# Patient Record
Sex: Female | Born: 1960 | Race: White | Hispanic: No | Marital: Married | Smoking: Never smoker
Health system: Southern US, Community
[De-identification: ages and names within clinical notes are randomized; demographics above are authoritative.]

## PROBLEM LIST (undated history)

## (undated) DIAGNOSIS — H532 Diplopia: Secondary | ICD-10-CM

## (undated) DIAGNOSIS — M2550 Pain in unspecified joint: Secondary | ICD-10-CM

## (undated) DIAGNOSIS — E785 Hyperlipidemia, unspecified: Secondary | ICD-10-CM

## (undated) DIAGNOSIS — N6019 Diffuse cystic mastopathy of unspecified breast: Secondary | ICD-10-CM

## (undated) DIAGNOSIS — G8929 Other chronic pain: Secondary | ICD-10-CM

## (undated) DIAGNOSIS — G4730 Sleep apnea, unspecified: Secondary | ICD-10-CM

## (undated) DIAGNOSIS — R413 Other amnesia: Secondary | ICD-10-CM

## (undated) DIAGNOSIS — F329 Major depressive disorder, single episode, unspecified: Secondary | ICD-10-CM

## (undated) DIAGNOSIS — J42 Unspecified chronic bronchitis: Secondary | ICD-10-CM

## (undated) DIAGNOSIS — IMO0001 Reserved for inherently not codable concepts without codable children: Secondary | ICD-10-CM

## (undated) DIAGNOSIS — F32A Depression, unspecified: Secondary | ICD-10-CM

## (undated) DIAGNOSIS — Z860100 Personal history of colon polyps, unspecified: Secondary | ICD-10-CM

## (undated) DIAGNOSIS — K589 Irritable bowel syndrome without diarrhea: Secondary | ICD-10-CM

## (undated) DIAGNOSIS — R112 Nausea with vomiting, unspecified: Secondary | ICD-10-CM

## (undated) DIAGNOSIS — F419 Anxiety disorder, unspecified: Secondary | ICD-10-CM

## (undated) DIAGNOSIS — Z9889 Other specified postprocedural states: Secondary | ICD-10-CM

## (undated) DIAGNOSIS — L98499 Non-pressure chronic ulcer of skin of other sites with unspecified severity: Secondary | ICD-10-CM

## (undated) DIAGNOSIS — E876 Hypokalemia: Secondary | ICD-10-CM

## (undated) DIAGNOSIS — K219 Gastro-esophageal reflux disease without esophagitis: Secondary | ICD-10-CM

## (undated) DIAGNOSIS — G43909 Migraine, unspecified, not intractable, without status migrainosus: Secondary | ICD-10-CM

## (undated) DIAGNOSIS — R1310 Dysphagia, unspecified: Secondary | ICD-10-CM

## (undated) DIAGNOSIS — I10 Essential (primary) hypertension: Secondary | ICD-10-CM

## (undated) DIAGNOSIS — J189 Pneumonia, unspecified organism: Secondary | ICD-10-CM

## (undated) DIAGNOSIS — U071 COVID-19: Secondary | ICD-10-CM

## (undated) DIAGNOSIS — N809 Endometriosis, unspecified: Secondary | ICD-10-CM

## (undated) DIAGNOSIS — R32 Unspecified urinary incontinence: Secondary | ICD-10-CM

## (undated) DIAGNOSIS — M549 Dorsalgia, unspecified: Secondary | ICD-10-CM

## (undated) DIAGNOSIS — K52831 Collagenous colitis: Secondary | ICD-10-CM

## (undated) HISTORY — DX: Anxiety disorder, unspecified: F41.9

## (undated) HISTORY — DX: Migraine, unspecified, not intractable, without status migrainosus: G43.909

## (undated) HISTORY — DX: Unspecified urinary incontinence: R32

## (undated) HISTORY — PX: OTHER SURGICAL HISTORY: SHX169

## (undated) HISTORY — DX: Other amnesia: R41.3

## (undated) HISTORY — DX: Other chronic pain: G89.29

## (undated) HISTORY — DX: Depression, unspecified: F32.A

## (undated) HISTORY — PX: LUMBAR FUSION: SHX111

## (undated) HISTORY — DX: COVID-19: U07.1

## (undated) HISTORY — DX: Sleep apnea, unspecified: G47.30

## (undated) HISTORY — PX: DILATION AND CURETTAGE OF UTERUS: SHX78

## (undated) HISTORY — DX: Unspecified chronic bronchitis: J42

## (undated) HISTORY — DX: Major depressive disorder, single episode, unspecified: F32.9

## (undated) HISTORY — DX: Dorsalgia, unspecified: M54.9

## (undated) HISTORY — PX: MOUTH SURGERY: SHX715

## (undated) HISTORY — PX: ABDOMINAL HYSTERECTOMY: SHX81

## (undated) HISTORY — DX: Diffuse cystic mastopathy of unspecified breast: N60.19

## (undated) HISTORY — PX: NASAL SEPTOPLASTY W/ TURBINOPLASTY: SHX2070

## (undated) HISTORY — PX: BREAST BIOPSY: SHX20

## (undated) HISTORY — DX: Essential (primary) hypertension: I10

## (undated) HISTORY — PX: LUMBAR DISC SURGERY: SHX700

## (undated) HISTORY — DX: Diplopia: H53.2

## (undated) HISTORY — DX: Pneumonia, unspecified organism: J18.9

## (undated) HISTORY — PX: SEPTOPLASTY: SUR1290

## (undated) HISTORY — PX: BACK SURGERY: SHX140

## (undated) HISTORY — DX: Gastro-esophageal reflux disease without esophagitis: K21.9

## (undated) HISTORY — DX: Irritable bowel syndrome, unspecified: K58.9

## (undated) HISTORY — DX: Personal history of colonic polyps: Z86.010

## (undated) HISTORY — DX: Personal history of colon polyps, unspecified: Z86.0100

## (undated) HISTORY — DX: Hyperlipidemia, unspecified: E78.5

## (undated) HISTORY — DX: Collagenous colitis: K52.831

## (undated) HISTORY — DX: Non-pressure chronic ulcer of skin of other sites with unspecified severity: L98.499

## (undated) HISTORY — DX: Reserved for inherently not codable concepts without codable children: IMO0001

---

## 2003-03-28 HISTORY — PX: LUMBAR MICRODISCECTOMY: SHX99

## 2003-11-03 ENCOUNTER — Encounter: Admission: RE | Admit: 2003-11-03 | Discharge: 2003-11-03 | Payer: Self-pay | Admitting: Anesthesiology

## 2003-11-27 ENCOUNTER — Inpatient Hospital Stay (HOSPITAL_COMMUNITY): Admission: RE | Admit: 2003-11-27 | Discharge: 2003-11-30 | Payer: Self-pay | Admitting: Neurological Surgery

## 2004-01-06 ENCOUNTER — Encounter: Admission: RE | Admit: 2004-01-06 | Discharge: 2004-01-06 | Payer: Self-pay | Admitting: Neurological Surgery

## 2004-01-22 ENCOUNTER — Ambulatory Visit (HOSPITAL_COMMUNITY): Admission: RE | Admit: 2004-01-22 | Discharge: 2004-01-22 | Payer: Self-pay | Admitting: Neurological Surgery

## 2004-04-14 ENCOUNTER — Encounter: Admission: RE | Admit: 2004-04-14 | Discharge: 2004-04-14 | Payer: Self-pay | Admitting: Neurological Surgery

## 2004-04-29 ENCOUNTER — Inpatient Hospital Stay (HOSPITAL_COMMUNITY): Admission: RE | Admit: 2004-04-29 | Discharge: 2004-05-01 | Payer: Self-pay | Admitting: Neurological Surgery

## 2004-05-24 ENCOUNTER — Emergency Department: Payer: Self-pay | Admitting: General Practice

## 2004-06-02 ENCOUNTER — Ambulatory Visit: Payer: Self-pay | Admitting: Internal Medicine

## 2004-06-07 ENCOUNTER — Encounter: Admission: RE | Admit: 2004-06-07 | Discharge: 2004-06-07 | Payer: Self-pay | Admitting: Neurological Surgery

## 2004-06-17 ENCOUNTER — Ambulatory Visit: Payer: Self-pay | Admitting: Internal Medicine

## 2004-08-04 ENCOUNTER — Encounter: Admission: RE | Admit: 2004-08-04 | Discharge: 2004-08-04 | Payer: Self-pay | Admitting: Neurological Surgery

## 2004-12-27 ENCOUNTER — Encounter: Admission: RE | Admit: 2004-12-27 | Discharge: 2004-12-27 | Payer: Self-pay | Admitting: Neurological Surgery

## 2005-01-14 ENCOUNTER — Ambulatory Visit (HOSPITAL_COMMUNITY): Admission: RE | Admit: 2005-01-14 | Discharge: 2005-01-14 | Payer: Self-pay | Admitting: Neurosurgery

## 2005-01-25 ENCOUNTER — Ambulatory Visit (HOSPITAL_COMMUNITY): Admission: RE | Admit: 2005-01-25 | Discharge: 2005-01-26 | Payer: Self-pay | Admitting: Neurosurgery

## 2005-06-01 ENCOUNTER — Encounter: Admission: RE | Admit: 2005-06-01 | Discharge: 2005-06-01 | Payer: Self-pay | Admitting: Anesthesiology

## 2005-07-21 ENCOUNTER — Ambulatory Visit: Payer: Self-pay | Admitting: Internal Medicine

## 2005-08-22 ENCOUNTER — Ambulatory Visit: Payer: Self-pay | Admitting: Internal Medicine

## 2005-08-31 ENCOUNTER — Ambulatory Visit: Payer: Self-pay | Admitting: Urology

## 2005-10-12 ENCOUNTER — Encounter: Admission: RE | Admit: 2005-10-12 | Discharge: 2005-10-12 | Payer: Self-pay | Admitting: Neurosurgery

## 2005-10-18 ENCOUNTER — Ambulatory Visit: Payer: Self-pay | Admitting: Internal Medicine

## 2005-11-16 ENCOUNTER — Ambulatory Visit: Payer: Self-pay | Admitting: Internal Medicine

## 2005-11-23 ENCOUNTER — Ambulatory Visit: Payer: Self-pay | Admitting: Internal Medicine

## 2005-11-29 ENCOUNTER — Ambulatory Visit: Payer: Self-pay | Admitting: Internal Medicine

## 2005-12-08 ENCOUNTER — Ambulatory Visit: Payer: Self-pay

## 2005-12-15 ENCOUNTER — Ambulatory Visit (HOSPITAL_COMMUNITY): Admission: RE | Admit: 2005-12-15 | Discharge: 2005-12-15 | Payer: Self-pay | Admitting: Neurosurgery

## 2005-12-19 ENCOUNTER — Ambulatory Visit: Payer: Self-pay | Admitting: Internal Medicine

## 2005-12-22 ENCOUNTER — Ambulatory Visit: Payer: Self-pay | Admitting: Internal Medicine

## 2006-01-10 ENCOUNTER — Ambulatory Visit: Payer: Self-pay | Admitting: Internal Medicine

## 2006-01-30 ENCOUNTER — Ambulatory Visit: Payer: Self-pay | Admitting: Internal Medicine

## 2006-02-02 ENCOUNTER — Encounter: Admission: RE | Admit: 2006-02-02 | Discharge: 2006-02-02 | Payer: Self-pay | Admitting: Neurological Surgery

## 2006-02-07 ENCOUNTER — Ambulatory Visit: Payer: Self-pay | Admitting: Internal Medicine

## 2006-03-15 ENCOUNTER — Inpatient Hospital Stay (HOSPITAL_COMMUNITY): Admission: RE | Admit: 2006-03-15 | Discharge: 2006-03-20 | Payer: Self-pay | Admitting: Neurological Surgery

## 2006-03-27 ENCOUNTER — Encounter: Admission: RE | Admit: 2006-03-27 | Discharge: 2006-03-27 | Payer: Self-pay | Admitting: Neurological Surgery

## 2006-05-08 ENCOUNTER — Ambulatory Visit: Payer: Self-pay | Admitting: Internal Medicine

## 2006-06-05 ENCOUNTER — Other Ambulatory Visit: Payer: Self-pay

## 2006-06-05 ENCOUNTER — Emergency Department: Payer: Self-pay | Admitting: Emergency Medicine

## 2006-06-26 ENCOUNTER — Encounter: Admission: RE | Admit: 2006-06-26 | Discharge: 2006-06-26 | Payer: Self-pay | Admitting: Neurological Surgery

## 2006-07-14 ENCOUNTER — Ambulatory Visit: Payer: Self-pay | Admitting: Internal Medicine

## 2006-07-31 ENCOUNTER — Encounter: Admission: RE | Admit: 2006-07-31 | Discharge: 2006-07-31 | Payer: Self-pay | Admitting: Neurological Surgery

## 2006-08-01 ENCOUNTER — Ambulatory Visit: Payer: Self-pay | Admitting: Internal Medicine

## 2006-08-10 ENCOUNTER — Inpatient Hospital Stay: Payer: Self-pay

## 2006-11-13 ENCOUNTER — Ambulatory Visit: Payer: Self-pay | Admitting: Internal Medicine

## 2006-12-29 ENCOUNTER — Ambulatory Visit: Payer: Self-pay | Admitting: Unknown Physician Specialty

## 2007-02-14 ENCOUNTER — Ambulatory Visit: Payer: Self-pay | Admitting: Internal Medicine

## 2007-03-16 ENCOUNTER — Encounter: Admission: RE | Admit: 2007-03-16 | Discharge: 2007-03-16 | Payer: Self-pay | Admitting: Neurological Surgery

## 2007-03-30 ENCOUNTER — Ambulatory Visit: Payer: Self-pay | Admitting: Internal Medicine

## 2007-05-22 ENCOUNTER — Ambulatory Visit: Payer: Self-pay | Admitting: Internal Medicine

## 2007-05-25 DIAGNOSIS — R51 Headache: Secondary | ICD-10-CM | POA: Insufficient documentation

## 2007-05-25 DIAGNOSIS — J309 Allergic rhinitis, unspecified: Secondary | ICD-10-CM | POA: Insufficient documentation

## 2007-05-25 DIAGNOSIS — J45909 Unspecified asthma, uncomplicated: Secondary | ICD-10-CM | POA: Insufficient documentation

## 2007-05-25 DIAGNOSIS — M549 Dorsalgia, unspecified: Secondary | ICD-10-CM | POA: Insufficient documentation

## 2007-05-25 DIAGNOSIS — K219 Gastro-esophageal reflux disease without esophagitis: Secondary | ICD-10-CM | POA: Insufficient documentation

## 2007-05-25 DIAGNOSIS — R519 Headache, unspecified: Secondary | ICD-10-CM | POA: Insufficient documentation

## 2007-05-25 DIAGNOSIS — J019 Acute sinusitis, unspecified: Secondary | ICD-10-CM | POA: Insufficient documentation

## 2007-06-06 ENCOUNTER — Ambulatory Visit: Payer: Self-pay | Admitting: Internal Medicine

## 2007-07-06 ENCOUNTER — Ambulatory Visit: Payer: Self-pay | Admitting: Internal Medicine

## 2007-07-23 ENCOUNTER — Ambulatory Visit: Payer: Self-pay | Admitting: Internal Medicine

## 2007-08-21 ENCOUNTER — Encounter: Payer: Self-pay | Admitting: Internal Medicine

## 2007-09-11 ENCOUNTER — Telehealth (INDEPENDENT_AMBULATORY_CARE_PROVIDER_SITE_OTHER): Payer: Self-pay | Admitting: *Deleted

## 2007-10-02 ENCOUNTER — Ambulatory Visit: Payer: Self-pay | Admitting: Internal Medicine

## 2007-10-03 ENCOUNTER — Ambulatory Visit: Payer: Self-pay | Admitting: Internal Medicine

## 2007-10-10 ENCOUNTER — Telehealth (INDEPENDENT_AMBULATORY_CARE_PROVIDER_SITE_OTHER): Payer: Self-pay | Admitting: *Deleted

## 2007-10-16 ENCOUNTER — Ambulatory Visit: Payer: Self-pay | Admitting: Internal Medicine

## 2008-01-22 ENCOUNTER — Telehealth (INDEPENDENT_AMBULATORY_CARE_PROVIDER_SITE_OTHER): Payer: Self-pay | Admitting: *Deleted

## 2008-01-23 ENCOUNTER — Telehealth (INDEPENDENT_AMBULATORY_CARE_PROVIDER_SITE_OTHER): Payer: Self-pay | Admitting: *Deleted

## 2008-01-30 ENCOUNTER — Encounter: Payer: Self-pay | Admitting: Internal Medicine

## 2008-02-11 ENCOUNTER — Encounter: Admission: RE | Admit: 2008-02-11 | Discharge: 2008-02-11 | Payer: Self-pay | Admitting: Neurological Surgery

## 2008-03-20 ENCOUNTER — Ambulatory Visit: Payer: Self-pay | Admitting: Internal Medicine

## 2008-03-28 ENCOUNTER — Ambulatory Visit: Payer: Self-pay | Admitting: Internal Medicine

## 2008-04-02 ENCOUNTER — Ambulatory Visit: Payer: Self-pay | Admitting: Internal Medicine

## 2008-05-22 ENCOUNTER — Telehealth (INDEPENDENT_AMBULATORY_CARE_PROVIDER_SITE_OTHER): Payer: Self-pay | Admitting: *Deleted

## 2008-07-15 ENCOUNTER — Ambulatory Visit: Payer: Self-pay | Admitting: Internal Medicine

## 2008-07-18 ENCOUNTER — Ambulatory Visit: Payer: Self-pay | Admitting: Internal Medicine

## 2008-08-18 ENCOUNTER — Telehealth (INDEPENDENT_AMBULATORY_CARE_PROVIDER_SITE_OTHER): Payer: Self-pay | Admitting: *Deleted

## 2008-10-23 ENCOUNTER — Telehealth (INDEPENDENT_AMBULATORY_CARE_PROVIDER_SITE_OTHER): Payer: Self-pay | Admitting: *Deleted

## 2008-10-31 ENCOUNTER — Ambulatory Visit: Payer: Self-pay

## 2008-10-31 ENCOUNTER — Ambulatory Visit: Payer: Self-pay | Admitting: Internal Medicine

## 2009-01-16 ENCOUNTER — Ambulatory Visit: Payer: Self-pay | Admitting: Cardiology

## 2009-04-10 ENCOUNTER — Ambulatory Visit: Payer: Self-pay | Admitting: Rheumatology

## 2009-11-12 ENCOUNTER — Ambulatory Visit: Payer: Self-pay | Admitting: Internal Medicine

## 2009-11-12 DIAGNOSIS — M3313 Other dermatomyositis without myopathy: Secondary | ICD-10-CM | POA: Insufficient documentation

## 2009-11-12 DIAGNOSIS — M3390 Dermatopolymyositis, unspecified, organ involvement unspecified: Secondary | ICD-10-CM | POA: Insufficient documentation

## 2009-12-02 ENCOUNTER — Ambulatory Visit: Payer: Self-pay | Admitting: Internal Medicine

## 2010-04-12 ENCOUNTER — Ambulatory Visit: Payer: Self-pay | Admitting: Internal Medicine

## 2010-04-28 ENCOUNTER — Ambulatory Visit: Payer: Self-pay | Admitting: Internal Medicine

## 2010-05-27 ENCOUNTER — Ambulatory Visit: Payer: Self-pay | Admitting: Internal Medicine

## 2010-06-03 ENCOUNTER — Telehealth (INDEPENDENT_AMBULATORY_CARE_PROVIDER_SITE_OTHER): Payer: Self-pay | Admitting: *Deleted

## 2010-06-28 ENCOUNTER — Ambulatory Visit: Payer: Self-pay | Admitting: Internal Medicine

## 2010-08-30 LAB — TSH: TSH: 0.82 u[IU]/mL (ref 0.41–5.90)

## 2010-08-30 LAB — BASIC METABOLIC PANEL: Glucose: 85 mg/dL

## 2010-09-07 ENCOUNTER — Encounter
Admission: RE | Admit: 2010-09-07 | Discharge: 2010-09-07 | Payer: Self-pay | Source: Home / Self Care | Attending: Anesthesiology | Admitting: Anesthesiology

## 2010-09-14 NOTE — Progress Notes (Signed)
Summary: Calling for meds for wheeze and cough  Phone Note Call from Patient   Caller: Patient Call For: young Reason for Call: Acute Illness Summary of Call: need somthing for asthma and allergy [facility] [phone] Initial call taken by: Rickard Patience,  January 22, 2008 8:46 AM  Follow-up for Phone Call        called and spoke with pt.   she c/o of nasal congestion that is green/brown in color with some blood.  she is wheezing and has a non-productive cough.  please advise Follow-up by: Marijo File CMA,  January 22, 2008 10:07 AM  Additional Follow-up for Phone Call Additional follow up Details #1::        Offer z-pak and Mucinex Does she need a prednisone taper? Additional Follow-up by: Waymon Budge MD,  January 22, 2008 12:07 PM    Additional Follow-up for Phone Call Additional follow up Details #2::    Pt is already using Mucinex. She does want the Zpak and feels she may need the Prednisone taper also. Michel Bickers CMA  January 22, 2008 2:07 PM  Additional Follow-up for Phone Call Additional follow up Details #3:: Details for Additional Follow-up Action Taken: Offer pred script to hold: prednisone 10 mg, # 20 2 tabs, 4 times dailyx 2 days, 3 times daily x 2 days, 2 times daily x 2 days, once daily x 2 days   Additional Follow-up by: Waymon Budge MD,  January 22, 2008 5:19 PM  New/Updated Medications: PREDNISONE 10 MG  TABS (PREDNISONE) 2 by mouth 4 times a day x2 days, 2 by mouth three times a day x2 days, 2 by mouth two times a day x2 days and 1 by mouth x 2 days and stop.   Left detailed message for pt regarding the Prednisone taper. She can call back with any further questions. Michel Bickers CMA  January 22, 2008 5:31 PM      Prescriptions: PREDNISONE 10 MG  TABS (PREDNISONE) 2 by mouth 4 times a day x2 days, 2 by mouth three times a day x2 days, 2 by mouth two times a day x2 days and 1 by mouth x 2 days and stop.  #20 x 0   Entered by:   Michel Bickers CMA   Authorized by:    Waymon Budge MD   Signed by:   Michel Bickers CMA on 01/22/2008   Method used:   Electronically sent to ...       [phone]       [address]       [place], [place]  [address]       Ph: [phone] or [phone]       Fax: [phone]   RxID:   [id number]     Appended Document: rx for zpak    Clinical Lists Changes  Medications: Added new medication of ZITHROMAX Z-PAK 250 MG  TABS (AZITHROMYCIN) use as directed - Signed Rx of ZITHROMAX Z-PAK 250 MG  TABS (AZITHROMYCIN) use as directed;  #1 x 0;  Signed;  Entered by: Boone Master CNA;  Authorized by: Waymon Budge MD;  Method used: Electronic    Prescriptions: ZITHROMAX Z-PAK 250 MG  TABS (AZITHROMYCIN) use as directed  #1 x 0   Entered by:   Boone Master CNA   Authorized by:   Waymon Budge MD  Signed by:   Boone Master CNA on 01/23/2008   Method used:   Electronically sent to ...       [facility] [phone]*       [address]       [place], [place]  [address]       Ph: [phone] or [phone]       Fax: [phone]   RxID:   [id number]

## 2010-09-14 NOTE — Assessment & Plan Note (Signed)
Summary: follow up/ mbw   Visit Type:  Follow-up PCP:  charlene [facility]/[place]  Chief Complaint:  follow up visit .  History of Present Illness: Current Problems:  SINUSITIS (ICD-473.9) BACK PAIN (ICD-724.5) HEADACHE (ICD-784.0) GERD (ICD-530.81) ASTHMA (ICD-493.90) ALLERGIC RHINITIS (ICD-101.57)  50 year old woman returning for follow-up of asthma and allergic rhinitis with history of recurrent sinusitis and significant limitation by chronic degenerative back pain.  On June 10, we had called in prednisone  and a Z-Pak, then Biaxin.  Head congestion got better, but did not completely clear.  Then, on July 15 she reported increased shortness of breath such that she went to an urgent care.  They put her on prednisone and Levaquin after giving injection of Rocephin and steroids.  Again, says she got better, but not well.  Saw her primary physician, July 29, and was told she needed more prednisone, and another course of Levaquin.  Also, her Flovent was increased to 220.  She was sent to an ENT who told her CT scan of sinuses was clear, but she had an "ulcer in her nose."  She was treated for yeast infection with Bactroban ointment, Lotrimin ointment , alternating.  She quit Singulair, saying it caused  headache.  She says she sings with her sister in church, but wheezes after singing.       Prior Medications Reviewed Using: Patient Recall  Updated Prior Medication List: FLOVENT HFA 220 MCG/ACT  AERO (FLUTICASONE PROPIONATE  HFA) 2 puffs two times a day EFFEXOR 37.5 MG  TABS (VENLAFAXINE HCL) Take 1 tablet by mouth once a day AMBIEN 10 MG  TABS (ZOLPIDEM TARTRATE) Take 1 tab by mouth at bedtime NEURONTIN 800 MG  TABS (GABAPENTIN) Take 1 tablet by mouth four times a day LIDODERM 5 %  PTCH (LIDOCAINE) Apply 2 patches to foot at bedtime OXYCONTIN 30 MG  TB12 (OXYCODONE HCL) Take 1 tablet by mouth three times a day VALIUM 5 MG  TABS (DIAZEPAM) Take 1 tablet by mouth three times a  day PERCOCET 7.5-325 MG  TABS (OXYCODONE-ACETAMINOPHEN) Take 1 tablet by mouth four times a day as needed CEREFOLIN NAC 5.6-2-600 MG  TABS (METHYLFOL-METHYLCOB-ACETYLCYST) Take 1 tablet by mouth once a day ALBUTEROL 90 MCG/ACT  AERS (ALBUTEROL) Inhale 2 puffs every four hours as needed ADULT ASPIRIN LOW STRENGTH 81 MG  TBDP (ASPIRIN) Take 1 tablet by mouth once a day TRIAMTERENE-HCTZ 37.5-25 MG  TABS (TRIAMTERENE-HCTZ) Take 1 tablet by mouth once a day METOPROLOL TARTRATE 25 MG  TABS (METOPROLOL TARTRATE) Take 1/2 tablet two times a day OMEPRAZOLE 20 MG  TBEC (OMEPRAZOLE) Take 1 tablet by mouth two times a day MULTIVITAMINS   TABS (MULTIPLE VITAMIN) Take 1 tablet by mouth once a day VENTOLIN HFA 108 (90 BASE) MCG/ACT  AERS (ALBUTEROL SULFATE) 2 puffs four times a day as needed ALPRAZOLAM 1 MG  TABS (ALPRAZOLAM) prn ALBUTEROL 90 MCG/ACT AERO SOLN (ALBUTEROL) 2 Puffs every 4 hours as needed for asthma * NEBULIZER MACHINE FOR AEROSOL MEDS  ALBUTEROL SULFATE (2.5 MG/3ML) 0.083%  NEBU (ALBUTEROL SULFATE) 1 neb four times a day prn * ALLERGY VACCINE 1:50 GO (W-E)  EPIPEN 2-PAK 0.3 MG/0.3ML (1:1000)  DEVI (EPINEPHRINE HCL (ANAPHYLAXIS)) For severe allergic reaction SINGULAIR 10 MG  TABS (MONTELUKAST SODIUM) once daily  Current Allergies (reviewed today): ! IMITREX ! METROGEL (METRONIDAZOLE) ! BUSPAR ! REMERON (MIRTAZAPINE) ! DURAGESIC-100 ! OPANA (OXYMORPHONE HCL) ! SINGULAIR (MONTELUKAST SODIUM) ! PREMARIN ! ESTROGEL (ESTRADIOL)  Past Medical History:    Reviewed history from  10/02/2007 and no changes required:       Allergic rhinitis       Asthma       GERD       Headache       chronic back pain       anxiety/depression          Social History:    Reviewed history from 10/02/2007 and no changes required:       Patient never smoked.       Disability due to back pain. Previously supervisor transcription, [employer]     Review of Systems      See HPI   Vital  Signs:  Patient Profile:   50 Years Old Female Weight:      235.38 pounds O2 Sat:      100 % O2 treatment:    Room Air Pulse rate:   90 / minute BP sitting:   98 / 70  (left arm) Cuff size:   large  Vitals Entered By: Reynaldo Minium CMA (April 02, 2008 11:25 AM)             Comments Medications reviewed with patient Reynaldo Minium CMA  April 02, 2008 11:26 AM      Physical Exam  General: A/Ox3; pleasant and cooperative, NAD, overweight SKIN: no rash, lesions NODES: no lymphadenopathy NECK: Supple w/ fair ROM, JVD- none, normal carotid impulses w/o bruits Thyroid- normal to palpation CHEST: Clear to P&A, but wheezy cough HEART: RRR, no m/g/r heard ABDOMEN: Soft and nl; nml bowel sounds; no organomegaly or masses noted LOV:FIEP, nl pulses, no edema  NEURO: Grossly intact to observation HEENT: [place]/AT, EOM- wnl, Conjunctivae- clear, PERRLA, TMs- wnl, Nose-  scab left septum, non obstructing, Throat- clear and wnl        Problem # 1:  ASTHMA (ICD-493.90) Chronic asthma with ongoing control problems as noted. Plan continue present meds. Emphasize environmental cotrols, reflux precautions, and use of inhaled meds where possible to minimize use of systemic steroids. The following medications were removed from the medication list:    Prednisone 5 Mg Tabs (Prednisone) .Marland Kitchen... Take 1tab by mouth 4times a day for 2 days;take 1 tab by mouth 3 times a day for 2 days;take 1 tablet by mouth 2 times a day for 2 days;take 1 tab by mouth for 2 days;d/c    Prednisone 10 Mg Tabs (Prednisone) .Marland Kitchen... 2 by mouth 4 times a day x2 days, 2 by mouth three times a day x2 days, 2 by mouth two times a day x2 days and 1 by mouth x 2 days and stop.  Her updated medication list for this problem includes:    Flovent Hfa 220 Mcg/act Aero (Fluticasone propionate  hfa) .Marland Kitchen... 2 puffs two times a day    Albuterol 90 Mcg/act Aers (Albuterol) ..... Inhale 2 puffs every four hours as needed    Ventolin Hfa 108 (90  Base) Mcg/act Aers (Albuterol sulfate) .Marland Kitchen... 2 puffs four times a day as needed    Albuterol 90 Mcg/act Aero Soln (Albuterol) .Marland Kitchen... 2 puffs every 4 hours as needed for asthma    Albuterol Sulfate (2.5 Mg/79ml) 0.083% Nebu (Albuterol sulfate) .Marland Kitchen... 1 neb four times a day prn    Singulair 10 Mg Tabs (Montelukast sodium) ..... Once daily   Problem # 2:  ALLERGIC RHINITIS (ICD-477.9) Rhinitis with erosion.  Plan: Continue Bactroban and Lotrimin as directed by her ENT.  Continue allergy vaccine.  I will check with the allergy lab about  advancing to 1:10.  Medications Added to Medication List This Visit: 1)  Flovent Hfa 220 Mcg/act Aero (Fluticasone propionate  hfa) .... 2 puffs two times a day   Patient Instructions: 1)  Please schedule a follow-up appointment in 4 months. 2)  I will talk with allergy lab about your vaccine dose.   ]  Appended Document: Advance vaccine to 1:10 GO (W-E)  Ready to advance to Allergy vaccine 1:10 as tolerated.

## 2010-09-14 NOTE — Miscellaneous (Signed)
Summary: Orders Update pft charges  Clinical Lists Changes  Orders: Added new Service order of Carbon Monoxide diffusing w/capacity (94720) - Signed Added new Service order of Lung Volumes (94240) - Signed Added new Service order of Spirometry (Pre & Post) (94060) - Signed 

## 2010-09-14 NOTE — Progress Notes (Signed)
Summary: rx request/ cough/ asthma  Phone Note Call from Patient   Caller: Patient Call For: young Summary of Call: requests rx prednisone and abx. pt c/o asthma/ cough x 1 wk. [facility] [address] [facility] [place] [phone].  Initial call taken by: Tivis Ringer,  August 18, 2008 10:39 AM  Follow-up for Phone Call        Called spoke with pt.  Onset of symptoms x 1 week ago.  Productive cough, green phlegm.  Incr SOB and difficulty breathing.  Pt states sore from coughing.  Pt is requesting abx and pred be called to pharmacy.  Pt states she has pending appt in 2 weeks.  Please advise. Thanks Follow-up by: Cloyde Reams RN,  August 18, 2008 12:46 PM  Additional Follow-up for Phone Call Additional follow up Details #1::        Offer prenisone 10 mg, # 20- 1 tab four times daily x 2 days, 3 times daily x 2 days, 2 times daily x 2 days, 1 time daily x 2 days  Offer Zpak    Additional Follow-up for Phone Call Additional follow up Details #2::    Called spoke with pt advised of CY's recommendations.  Rx sent to pharmacy.  Pt aware. Follow-up by: Cloyde Reams RN,  August 18, 2008 2:04 PM  New/Updated Medications: ZITHROMAX Z-PAK 250 MG TABS (AZITHROMYCIN) use as directed   Prescriptions: ZITHROMAX Z-PAK 250 MG TABS (AZITHROMYCIN) use as directed  #1 x 0   Entered by:   Cloyde Reams RN   Authorized by:   Waymon Budge MD   Signed by:   Cloyde Reams RN on 08/18/2008   Method used:   Electronically to        [facility] [phone]* (retail)       [address]       [place], [place]  [address]       Ph: [phone] or [phone]       Fax: [phone]   RxID:   [id number] PREDNISONE 10 MG TABS (PREDNISONE) 4 x 2 days, 3 x 2 days, 2 x 2 days, then 1 x 2 days then stop  #20 x 0   Entered by:   Cloyde Reams RN   Authorized by:   Waymon Budge MD   Signed by:   Cloyde Reams RN on 08/18/2008   Method used:   Electronically to        [facility] [phone]* (retail)       [address]       [place], [place]  [address]       Ph: [phone] or [phone]       Fax: [phone]   RxID:   [id number]

## 2010-09-14 NOTE — Progress Notes (Signed)
Summary: increased sob/wheezing/cough----rx for z pak  Phone Note Call from Patient   Caller: Patient Call For: young Summary of Call: asthma flare up with cough and wheezing [facility] [address] Initial call taken by: Rickard Patience,  June 03, 2010 1:54 PM  Follow-up for Phone Call        called and spoke with pt. pt was recently seen by CY last week on 05/27/2010 and was given rx for zpak and pred taper.  Pt states she finished z pak and has 1 day left on pred taper.  pt states she started to feel better but was around her sick grandson recently and believes she is getting sick again.  Pt c/o increased sob, wheezing, tightness in chest, scratchy throat, PND, and cough but states she is unable to get sputum up.  Will forward message to CY to address. Aundra Millet Reynolds LPN  June 03, 2010 2:22 PM  Allergies (verified):  1)  ! Imitrex 2)  ! Metrogel (Metronidazole) 3)  ! Buspar 4)  ! Remeron (Mirtazapine) 5)  ! Duragesic-100 6)  ! Opana (Oxymorphone Hcl) 7)  ! Singulair (Montelukast Sodium) 8)  ! Premarin 9)  ! Estrogel (Estradiol)  Additional Follow-up for Phone Call Additional follow up Details #1::        Per CDY-tell patient to increase her fluids, take Tylenol as directed also give her Doxycycline 100mg  #8 take 2 today then 1 daily until gone(SHE IS TO HOLD THIS RX AT PHARMACY UNTIL THINGS GET WORSE).Reynaldo Minium CMA  June 03, 2010 3:08 PM    New Allergies: ! DOXYCYCLINE Additional Follow-up for Phone Call Additional follow up Details #2::    called and spoke with pt and she was informed per CY to take the tylenol as directed, increase her fluids and i explained to her about the doxy---pt stated that the doxy causes her to have abd pain and she would like something else called in and wanted to know if CY will give her prednisone as well.  please advise. thanks Randell Loop CMA  June 03, 2010 3:42 PM    per CY:  z pak # 1 x 0 refills.  UAD.   No prednisone.  She  needs to use probiotics for this.   called and spoke with pt.  pt aware of CY's response and rx sent to pharmacy. Aundra Millet Reynolds LPN  June 03, 2010 5:30 PM   New/Updated Medications: ZITHROMAX Z-PAK 250 MG TABS (AZITHROMYCIN) take as directed New Allergies: ! DOXYCYCLINEPrescriptions: ZITHROMAX Z-PAK 250 MG TABS (AZITHROMYCIN) take as directed  #1 x 0   Entered by:   Arman Filter LPN   Authorized by:   Waymon Budge MD   Signed by:   Arman Filter LPN on 57/84/6962   Method used:   Electronically to        [facility] [phone]* (retail)       [address] [place], [place]  [address]       Ph: [phone] or [phone]       Fax: [phone]   RxID:   [id number]

## 2010-09-14 NOTE — Assessment & Plan Note (Signed)
Summary: rov 1 month///kp   Primary Provider/Referring Provider:  charlene Scott/[place]  CC:  1 month follow up visit-review PFT results..  History of Present Illness:  History of Present Illness: November 12, 2009- Asthma, allergic rhinitis, dermatomyositis............................Marland Kitchenmother here She has gone to ENT for prednisone, and to urgent care for wheeze. ENT  treats an ulcer she reports in left nostril. Last prednisone 1 month ago.  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 Methotrexate and steroids didn't help this. Sees Dr Lavenia Atlas at [facility] for rheumatolgy. CXR said ok w/in last 6 months. Breathing ok. Has had sinus infection with right retroorbital and maxillary pain x 2 weeks. Does squeeze bottle saline rinse with scant green. No antibiotics.  April 12, 2010- Asthma, allergic rhinitis, dermatomyositis......................Marland Kitchenmother here [place] herself- air movement helps. Hot summer weather was hard on her. Notices most days she will have some productive cough, since sinus infection in March. Then had flu in May despitre flu shot, with myalgias- tested positive at urgent care. They eneded up giving amox for sinuses. Daily wheeze. Uses Ventolin, Symbicort, neb/ albuterol. allergy vaccine is at 1:10.  May 27, 2010- Asthma, allergic rhinitis, dermatomyositis Nurse cc: 1 month follow up visit-review PFT results. Keeps a cough most of the time. Stopped coughing completely on prednisone. Off pred x 5 weeks. . Cough is worse since they are moving and stirring up dust. Coughs up light greenish brown. Suspects low grade fever. . Yesterday throat and ears were a little sore, not sore now.  PFT- Mild obstructive disease small airways with response to bronchodilator, mild restriction.          FEV1 2.31/ 81%; FEV1/FVC 0.76; TLC 75%, DLCO 74%    Asthma History    Asthma Control Assessment:    Age range: 12+ years    Symptoms: throughout the  day    Nighttime Awakenings: 0-2/month    Interferes w/ normal activity: some limitations    SABA use (not for EIB): >2 days/week    Asthma Control Assessment: Very Poorly Controlled   Preventive Screening-Counseling & Management  Alcohol-Tobacco     Smoking Status: never  Current Medications (verified): 1)  Effexor 37.5 Mg  Tabs (Venlafaxine Hcl) .... Take 1 Tablet By Mouth Once A Day 2)  Ambien 10 Mg  Tabs (Zolpidem Tartrate) .... Take 1 Tab By Mouth At Bedtime 3)  Neurontin 800 Mg  Tabs (Gabapentin) .... Take 1 Tablet By Mouth Four Times A Day 4)  Lidoderm 5 %  Ptch (Lidocaine) .... Apply 2 Patches To Foot At Bedtime 5)  Oxycontin 60 Mg Xr12h-Tab (Oxycodone Hcl) .... Take 1 By Mouth Three Times A Day 6)  Valium 5 Mg  Tabs (Diazepam) .... Take 1 Tablet By Mouth Three Times A Day 7)  Oxycodone-Acetaminophen 5-500 Mg Caps (Oxycodone-Acetaminophen) .... Take 1 By Mouth Two Times A Day 8)  Adult Aspirin Low Strength 81 Mg  Tbdp (Aspirin) .... Take 1 Tablet By Mouth Once A Day 9)  Triamterene-Hctz 37.5-25 Mg  Tabs (Triamterene-Hctz) .... Take 1 Tablet By Mouth Once A Day 10)  Metoprolol Tartrate 25 Mg  Tabs (Metoprolol Tartrate) .... Take 1/2 Tablet Two Times A Day 11)  Omeprazole 20 Mg  Tbec (Omeprazole) .... Take 1 Tablet By Mouth Two Times A Day 12)  Multivitamins   Tabs (Multiple Vitamin) .... Take 1 Tablet By Mouth Once A Day 13)  Ventolin Hfa 108 (90 Base) Mcg/act  Aers (Albuterol Sulfate) .... 2 Puffs Four Times A  Day As Needed 14)  Nebulizer Machine For Aerosol Meds 15)  Albuterol Sulfate (2.5 Mg/73ml) 0.083%  Nebu (Albuterol Sulfate) .Marland Kitchen.. 1 Neb Four Times A Day Prn 16)  Allergy Vaccine 1:10 Go (W-E) 17)  Allergy Vaccine Increase To 1:10 Go (W-E) .... Build As Tolerated Per Protocol 18)  Epipen 0.3 Mg/0.80ml Devi (Epinephrine) .... For Severe Allergic Reaction 19)  Symbicort 160-4.5 Mcg/act Aero (Budesonide-Formoterol Fumarate) .... 2 Puffs and Rinse Mouth Twice Daily  Allergies  (verified): 1)  ! Imitrex 2)  ! Metrogel (Metronidazole) 3)  ! Buspar 4)  ! Remeron (Mirtazapine) 5)  ! Duragesic-100 6)  ! Opana (Oxymorphone Hcl) 7)  ! Singulair (Montelukast Sodium) 8)  ! Premarin 9)  ! Estrogel (Estradiol)  Past History:  Past Surgical History: Last updated: 10/02/2007 Nasal septoplasty/ turbinate reduction Breast biopsy- benign Spine surgeries  Family History: Last updated: 11/12/2009 Mother- allergic rhinitis, non-Hodgkins Lymphoma Several family with asthma/ allergy Uncle with leukemia Father living  Social History: Last updated: 10/02/2007 Patient never smoked. Disability due to back pain. Previously supervisor transcription, [employer]   Risk Factors: Smoking Status: never (05/27/2010)  Past Medical History: Allergic rhinitis Asthma- PFT 04/28/10- FEV1 2.31/ 81%; FEV!/FVC 0.76; + resp to BD GERD Headache chronic back pain anxiety/depression  Review of Systems      See HPI       The patient complains of shortness of breath with activity, non-productive cough, and change in color of mucus.  The patient denies shortness of breath at rest, productive cough, coughing up blood, chest pain, irregular heartbeats, acid heartburn, indigestion, loss of appetite, weight change, abdominal pain, difficulty swallowing, sore throat, tooth/dental problems, headaches, nasal congestion/difficulty breathing through nose, sneezing, itching, rash, and fever.    Vital Signs:  Patient profile:   50 year old female Weight:      227.25 pounds O2 Sat:      95 % on Room air Pulse rate:   90 / minute BP sitting:   118 / 70  (left arm) Cuff size:   large  Vitals Entered By: Reynaldo Minium CMA (May 27, 2010 10:20 AM)  O2 Flow:  Room air CC: 1 month follow up visit-review PFT results.   Physical Exam  Additional Exam:  General: A/Ox3; pleasant and cooperative, NAD, overweight, fanning herself again "hot flashes" SKIN: Very faint erythema upper  arms NODES: no lymphadenopathy HEENT:conjunctivae clear, dentures, no stridor, Mallampati  III, not red NECK: Supple w/ fair ROM, JVD- none, normal carotid impulses w/o bruits Thyroid- normal to palpation, dentures, Mallampati  III CHEST: end expiratory wheeze., dry cough, unlabored HEART: RRR, no m/g/r heard ABDOMEN: Soft and nl;  ZOX:WRUE, nl pulses, no edema  NEURO: Grossly intact to observation      Impression & Recommendations:  Problem # 1:  ASTHMA (ICD-493.90) PFT is consistent with asthma. She has an acute, viral pattern illness now with potential to exacerbate. We discussed supportive care and steroids. i will give prednisone taper and Z pak in case it doesn't respond to rest and symptomatic treatment.  Reduced DLCO and TLC are mild, but may reflect loss of thoracic height and increased abdominal pressure with weight loss.  Problem # 2:  SINUSITIS (ICD-473.9) This is not yet an overt problem with the current illness.   Medications Added to Medication List This Visit: 1)  Oxycontin 60 Mg Xr12h-tab (Oxycodone hcl) .... Take 1 by mouth three times a day 2)  Oxycodone-acetaminophen 5-500 Mg Caps (Oxycodone-acetaminophen) .... Take 1 by mouth two  times a day 3)  Zithromax Z-pak 250 Mg Tabs (Azithromycin) .... 2 today then one daily 4)  Prednisone 10 Mg Tabs (Prednisone) .Marland Kitchen.. 1 tab four times daily x 2 days, 3 times daily x 2 days, 2 times daily x 2 days, 1 time daily x 2 days  Other Orders: Est. Patient Level IV (14782) Flu Vaccine 48yrs + MEDICARE PATIENTS (N5621) Administration Flu vaccine - MCR (H0865)  Patient Instructions: 1)  Please schedule a follow-up appointment in 4 months. Call sooner if needed 2)  script for zpak and for prednisone sent to drug store 3)  Stay warm and dry, get enough rest and fluids. Mucinex may help to clear mucus if needed.  4)  flu vax Prescriptions: PREDNISONE 10 MG TABS (PREDNISONE) 1 tab four times daily x 2 days, 3 times daily x 2 days, 2  times daily x 2 days, 1 time daily x 2 days  #20 x 0   Entered and Authorized by:   Waymon Budge MD   Signed by:   Waymon Budge MD on 05/27/2010   Method used:   Electron[facility] to        [facility] [phone]* (retail)       [address] [place], [place]  [address]       Ph: [phone] or [phone]       Fax: [phone]   RxID:   [id number] ZITHROMAX Z-PAK 250 MG TABS (AZITHROMYCIN) 2 today then one daily  #1 pak x 0   Entered and Authorized by:   Waymon Budge MD   Signed by:   Waymon Budge MD on 05/27/2010   Method used:   Electronically to        [facility] [phone]* (retail)       [address] [place], [place]  [address]       Ph: [phone] or [phone]       Fax: [phone]   RxID:   [id number]              Flu Vaccine Consent Questions     Do you have a history of severe allergic reactions to this vaccine? no    Any prior history of allergic reactions to egg and/or gelatin? no    Do you have a sensitivity to the preservative Thimersol? no    Do you have a past history of Guillan-Barre Syndrome? no    Do you currently have an acute febrile illness? no    Have you ever had a severe reaction to latex? no    Vaccine information given and explained to patient? yes    Are you currently pregnant? no    Lot Number:[id number]   Exp Date:02/12/2011   Site Given  Left Deltoid IMflu Zackery Barefoot CMA  May 27, 2010 11:14 AM

## 2010-09-14 NOTE — Progress Notes (Signed)
Summary: rx req  Phone Note Call from Patient   Caller: Patient Call For: young Summary of Call: pt wants rx for singular as sample [facility] well[facility] [place] [facility] [address] [phone]. Patient's chart has been requested. Initial call taken by: Tivis Ringer,  October 10, 2007 2:10 PM  Follow-up for Phone Call        Rx was sent electronically.  LMOM for pt to be aware. Follow-up by: Vernie Murders,  October 10, 2007 2:38 PM    New/Updated Medications: SINGULAIR 10 MG  TABS (MONTELUKAST SODIUM) once daily   Prescriptions: SINGULAIR 10 MG  TABS (MONTELUKAST SODIUM) once daily  #30 x 11   Entered by:   Vernie Murders   Authorized by:   Waymon Budge MD   Signed by:   Vernie Murders on 10/10/2007   Method used:   Electronically sent to ...       [facility] [phone]*       [address]       [place], [place]  [address]       Ph: [phone] or [phone]       Fax: [phone]   RxID:   [id number]

## 2010-09-14 NOTE — Progress Notes (Signed)
Summary: sick - rx-responded  Phone Note Call from Patient Call back at Home Phone [phone]   Caller: Patient Call For: young Reason for Call: Talk to Nurse, Talk to Doctor Summary of Call: cough,congestion,wheezing,sob [phone] Initial call taken by: Eugene Gavia,  September 11, 2007 9:07 AM  Follow-up for Phone Call        Spoke with pt, she c/o sob with activity, prod cough with thick yellow sputum and wheezing.  She was dxed with uri last wk at urgent care, and txed with predtaper that she finished yesterday, and a zpack.  She is requesting more abx be called in. [facility] [address] [facility] [place].  Follow-up by: Vernie Murders,  September 11, 2007 9:18 AM  Additional Follow-up for Phone Call Additional follow up Details #1::        biaxin 500 mg , # 14, 1 two times a day x 7 days encourage fluids and try mucinex Additional Follow-up by: Waymon Budge MD,  September 11, 2007 9:23 AM    Additional Follow-up for Phone Call Additional follow up Details #2::    pt is requesting prednisone as well. please advise.  Follow-up by: Cyndia Diver LPN,  September 11, 2007 9:42 AM  Additional Follow-up for Phone Call Additional follow up Details #3:: Details for Additional Follow-up Action Taken: Predinsone Taper 5mg  #40 1 by mouth  four times a day x 2 days 1 by mouth three times a day x 2days 1 by mouth two times a day x 2 days 1 by mouth once daily x 2 days, then D/C. Additional Follow-up by: Reynaldo Minium CMA,  September 11, 2007 11:57 AM  New/Updated Medications: BIAXIN 500 MG  TABS (CLARITHROMYCIN) Take 1 tablet by mouth two times a day PREDNISONE 5 MG  TABS (PREDNISONE) Take 1tab by mouth 4times a day for 2 days;Take 1 tab by mouth 3 times a day for 2 days;Take 1 tablet by mouth 2 times a day for 2 days;take 1 tab by mouth for 2 days;d/c   Prescriptions: BIAXIN 500 MG  TABS (CLARITHROMYCIN) Take 1 tablet by mouth two times a day  #14 x 0   Entered  by:   Cyndia Diver LPN   Authorized by:   Waymon Budge MD   Signed by:   Cyndia Diver LPN on 62/13/0865   Method used:   Telephoned to ...       [facility] [phone]*       [address] [place], [place]  [address]       Ph: [phone] or [phone]       Fax: [phone]   RxID:   [id number] PREDNISONE 5 MG  TABS (PREDNISONE) Take 1tab by mouth 4times a day for 2 days;Take 1 tab by mouth 3 times a day for 2 days;Take 1 tablet by mouth 2 times a day for 2 days;take 1 tab by mouth for 2 days;d/c  #40 x 0   Entered by:   Cyndia Diver LPN   Authorized by:   Waymon Budge MD   Signed by:   Cyndia Diver LPN on 66/01/3015   Method used:   Telephoned to ...       [facility] [phone]*       [address] [place]  [place], [place]  [address]       Ph: [phone] or [phone]       Fax: [phone]   RxID:   [id number]

## 2010-09-14 NOTE — Assessment & Plan Note (Signed)
Summary: rov/apc   Primary Provider/Referring Provider:  charlene Scott/[place]  CC:  Follow up visit-wheezing increased; worse at night..  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  On June 10, we had called in prednisone  and a Z-Pak, then Biaxin.  Head congestion got better, but did not completely clear.  Then, on July 15 she reported increased shortness of breath such that she went to an urgent care.  They put her on prednisone and Levaquin after giving injection of Rocephin and steroids.  Again, says she got better, but not well.  Saw her primary physician, July 29, and was told she needed more prednisone, and another course of Levaquin.  Also, her Flovent was increased to 220.  She was sent to an ENT who told her CT scan of sinuses was clear, but she had an "ulcer in her nose."  She was treated for yeast infection with Bactroban ointment, Lotrimin ointment , alternating.  She quit Singulair, saying it caused  headache.  She says she sings with her sister in church, but wheezes after singing.  November 12, 2009- Asthma, allergic rhinitis, dermatomyositis............................Marland Kitchenmother here She has gone to ENT for prednisone, and to urgent care for wheeze. ENT  treats an ulcer she reports in left nostril. Last prednisone 1 month ago.  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 Methotrexate and steroids didn't help this. Sees Dr Lavenia Atlas at [facility] for rheumatolgy. CXR said ok w/in last 6 months. Breathing ok. Has had sinus infection with right retroorbital and maxillary pain x 2 weeks. Does squeeze bottle saline rinse with scant green. No antibiotics.   Current Medications (verified): 1)  Flovent Hfa 220 Mcg/act  Aero (Fluticasone Propionate  Hfa) .... 2 Puffs Two Times A Day 2)  Effexor 37.5  Mg  Tabs (Venlafaxine Hcl) .... Take 1 Tablet By Mouth Once A Day 3)  Ambien 10 Mg  Tabs (Zolpidem Tartrate) .... Take 1 Tab By Mouth At Bedtime 4)  Neurontin 800 Mg  Tabs (Gabapentin) .... Take 1 Tablet By Mouth Four Times A Day 5)  Lidoderm 5 %  Ptch (Lidocaine) .... Apply 2 Patches To Foot At Bedtime 6)  Oxycontin 30 Mg  Tb12 (Oxycodone Hcl) .... Take 1 Tablet By Mouth Three Times A Day 7)  Valium 5 Mg  Tabs (Diazepam) .... Take 1 Tablet By Mouth Three Times A Day 8)  Percocet 7.5-325 Mg  Tabs (Oxycodone-Acetaminophen) .... Take 1 Tablet By Mouth Four Times A Day As Needed 9)  Cerefolin Nac 5.6-2-600 Mg  Tabs (Methylfol-Methylcob-Acetylcyst) .... Take 1 Tablet By Mouth Once A Day 10)  Adult Aspirin Low Strength 81 Mg  Tbdp (Aspirin) .... Take 1 Tablet By Mouth Once A Day 11)  Triamterene-Hctz 37.5-25 Mg  Tabs (Triamterene-Hctz) .... Take 1 Tablet By Mouth Once A Day 12)  Metoprolol Tartrate 25 Mg  Tabs (Metoprolol Tartrate) .... Take 1/2 Tablet Two Times A Day 13)  Omeprazole 20 Mg  Tbec (Omeprazole) .... Take 1 Tablet By Mouth Two Times A Day 14)  Multivitamins   Tabs (Multiple Vitamin) .... Take 1 Tablet By Mouth Once A Day 15)  Ventolin Hfa 108 (90 Base) Mcg/act  Aers (Albuterol Sulfate) .... 2 Puffs Four Times A Day As Needed 16)  Alprazolam 1 Mg  Tabs (Alprazolam) .... Prn 17)  Nebulizer Machine For [employer] 18)  Albuterol  Sulfate (2.5 Mg/60ml) 0.083%  Nebu (Albuterol Sulfate) .Marland Kitchen.. 1 Neb Four Times A Day Prn 19)  Allergy Vaccine 1:50 Go (W-E) 20)  Allergy Vaccine Increase To 1:10 Go (W-E) .... Build As Tolerated Per Protocol 21)  Epipen 2-Pak 0.3 Mg/0.56ml (1:1000)  Devi (Epinephrine Hcl (Anaphylaxis)) .... For Severe Allergic Reaction 22)  Singulair 10 Mg  Tabs (Montelukast Sodium) .... Once Daily  Allergies (verified): 1)  ! Imitrex 2)  ! Metrogel (Metronidazole) 3)  ! Buspar 4)  ! Remeron (Mirtazapine) 5)  ! Duragesic-100 6)  ! Opana (Oxymorphone Hcl) 7)  ! Singulair  (Montelukast Sodium) 8)  ! Premarin 9)  ! Estrogel (Estradiol)  Past History:  Past Medical History: Last updated: 10/02/2007 Allergic rhinitis Asthma GERD Headache chronic back pain anxiety/depression  Past Surgical History: Last updated: 10/02/2007 Nasal septoplasty/ turbinate reduction Breast biopsy- benign Spine surgeries  Family History: Last updated: 11/12/2009 Mother- allergic rhinitis, non-Hodgkins Lymphoma Several family with asthma/ allergy Uncle with leukemia Father living  Social History: Last updated: 10/02/2007 Patient never smoked. Disability due to back pain. Previously supervisor transcription, [employer]   Risk Factors: Smoking Status: never (10/02/2007)  Family History: Mother- allergic rhinitis, non-Hodgkins Lymphoma Several family with asthma/ allergy Uncle with leukemia Father living  Review of Systems      See HPI       The patient complains of dyspnea on exertion and headaches.  The patient denies anorexia, fever, weight loss, weight gain, vision loss, decreased hearing, hoarseness, chest pain, syncope, peripheral edema, prolonged cough, hemoptysis, and severe indigestion/heartburn.    Vital Signs:  Patient profile:   50 year old female Weight:      224.25 pounds O2 Sat:      98 % on Room air Pulse rate:   86 / minute BP sitting:   122 / 82  (left arm) Cuff size:   large  Vitals Entered By: Reynaldo Minium CMA (November 12, 2009 3:03 PM)  O2 Flow:  Room air  Physical Exam  Additional Exam:  General: A/Ox3; pleasant and cooperative, NAD, overweight SKIN: faint pink rash dorsa of forearms NODES: no lymphadenopathy NECK: Supple w/ fair ROM, JVD- none, normal carotid impulses w/o bruits Thyroid- normal to palpation, dentures, Mallampati  III CHEST: Clear to P&A,no wheeze HEART: RRR, no m/g/r heard ABDOMEN: Soft and nl; nml bowel sounds; no organomegaly or masses noted KVQ:QVZD, nl pulses, no edema  NEURO: Grossly intact to  observation HEENT: [place]/AT, EOM- wnl, Conjunctivae- clear, PERRLA, TMs- wnl, Nose- small crust left septum, non obstructing, Throat- clear and wnl     Impression & Recommendations:  Problem # 1:  SINUSITIS (ICD-473.9) Underlying allergic rhinitis is controlled fairly well. She seems ok to continue allergy vaccine. We will give augmentin and have her continue sinus saline rinse.  Problem # 2:  ASTHMA (ICD-493.90) Complicating underlying dermatomyositis, but not currently on antiinflamatory / immunosupressive therapy. Today control is good. Stopped Singulair. Continues Flovent. Needs replacement script for nebulizer and albuterol.  Medications Added to Medication List This Visit: 1)  Epipen 0.3 Mg/0.72ml Devi (Epinephrine) .... For severe allergic reaction 2)  Augmentin 875-125 Mg Tabs (Amoxicillin-pot clavulanate) .Marland Kitchen.. 1 two times a day 3)  Symbicort 160-4.5 Mcg/act Aero (Budesonide-formoterol fumarate) .... 2 puffs and rinse mouth twice daily  Other Orders: Est. Patient Level III (63875)  Patient Instructions: 1)  Please schedule a follow-up appointment in 1 year. 2)  Continue saline nasal rinse 3)  continue allergy vaccine 4)  Script for nebulizer and albuterol  neb solution 5)  Sample and script to try Symbicort: 2 puffs then rinse mouth, twice daily  This would replace Flovent. 6)  You can still use Ventolin HFA as a rescue inhaler, 2 puffs four times a day as needed  Prescriptions: EPIPEN 0.3 MG/0.3ML DEVI (EPINEPHRINE) For severe allergic reaction  #1 x prn   Entered and Authorized by:   Waymon Budge MD   Signed by:   Waymon Budge MD on 11/12/2009   Method used:   Print then Give to Patient   RxID:   [id number] AUGMENTIN 875-125 MG TABS (AMOXICILLIN-POT CLAVULANATE) 1 two times a day  #14 x 1   Entered and Authorized by:   Waymon Budge MD   Signed by:   Waymon Budge MD on 11/12/2009   Method used:   Print then Give to Patient   RxID:    [id number] SYMBICORT 160-4.5 MCG/ACT AERO (BUDESONIDE-FORMOTEROL FUMARATE) 2 puffs and rinse mouth twice daily  #1 x prn   Entered and Authorized by:   Waymon Budge MD   Signed by:   Waymon Budge MD on 11/12/2009   Method used:   Print then Give to Patient   RxID:   [id number] VENTOLIN HFA 108 (90 BASE) MCG/ACT  AERS (ALBUTEROL SULFATE) 2 puffs four times a day as needed  #1 x prn   Entered and Authorized by:   Waymon Budge MD   Signed by:   Waymon Budge MD on 11/12/2009   Method used:   Print then Give to Patient   RxID:   [id number] ALBUTEROL SULFATE (2.5 MG/3ML) 0.083%  NEBU (ALBUTEROL SULFATE) 1 neb four times a day prn  #125 x prn   Entered and Authorized by:   Waymon Budge MD   Signed by:   Waymon Budge MD on 11/12/2009   Method used:   Print then Give to Patient   RxID:   [id number] NEBULIZER MACHINE FOR AEROSOL MEDS   #1 x prn   Entered and Authorized by:   Waymon Budge MD   Signed by:   Waymon Budge MD on 11/12/2009   Method used:   Print then Give to Patient   RxID:   [id number]

## 2010-09-14 NOTE — Progress Notes (Signed)
Summary: LMTCB x2-medication  Phone Note Call from Patient   Caller: Patient Call For: young Summary of Call: need prednisone and antibiotic for cough congestion and asthma [facility] Initial call taken by: Rickard Patience,  May 22, 2008 11:36 AM  Follow-up for Phone Call        lmomtcb Vernie Murders  May 22, 2008 3:09 PM  Attempted TCB pt.  LMOM TCB. Cloyde Reams RN  May 23, 2008 12:27 PM    Additional Follow-up for Phone Call Additional follow up Details #1::        Spoke with pt.  She c/o cough, prod with green sputum and wheezing x 4 days.  She is requesting prednisone and abx be sent to pharm.  Please advise, thanks!   Additional Follow-up by: Vernie Murders,  May 23, 2008 4:44 PM    Additional Follow-up for Phone Call Additional follow up Details #2::    Offer: 1)prednisone 10mg , # 20  1 tab four times daily x 2 days, 3 times daily x 2 days, 2 times daily x 2 days, 1 time daily x 2 days   2) Doxycycline 100 mg, # 8, 2 today then one daily    Additional Follow-up for Phone Call Additional follow up Details #3:: Details for Additional Follow-up Action Taken: rxs were sent.  Pt was made aware. Additional Follow-up by: Vernie Murders,  May 23, 2008 5:20 PM  New/Updated Medications: DOXYCYCLINE HYCLATE 100 MG CAPS (DOXYCYCLINE HYCLATE) 2 by mouth today, then 1 daily until gone PREDNISONE 10 MG TABS (PREDNISONE) 4 x 2 days, 3 x 2 days, 2 x 2 days, then 1 x 2 days then stop   Prescriptions: PREDNISONE 10 MG TABS (PREDNISONE) 4 x 2 days, 3 x 2 days, 2 x 2 days, then 1 x 2 days then stop  #20 x 0   Entered by:   Vernie Murders   Authorized by:   Waymon Budge MD   Signed by:   Vernie Murders on 05/23/2008   Method used:   Electronically to        [facility] [phone]* (retail)       [address]       [place], [place]  [address]       Ph: [phone] or [phone]       Fax: [phone]   RxID:    [id number] DOXYCYCLINE HYCLATE 100 MG CAPS (DOXYCYCLINE HYCLATE) 2 by mouth today, then 1 daily until gone  #8 x 0   Entered by:   Vernie Murders   Authorized by:   Waymon Budge MD   Signed by:   Vernie Murders on 05/23/2008   Method used:   Electronically to        [facility] [phone]* (retail)       [address]       [place], [place]  [address]       Ph: [phone] or [phone]       Fax: [phone]   RxID:   [id number]

## 2010-09-14 NOTE — Assessment & Plan Note (Signed)
Summary: rov ///kp   Primary Provider/Referring Provider:  charlene Scott/[place]  CC:  Follow up visit-increased cough(worse with heat);SOB with activity;and wheezing (worse at night)..  History of Present Illness: November 12, 2009- Asthma, allergic rhinitis, dermatomyositis............................Marland Kitchenmother here She has gone to ENT for prednisone, and to urgent care for wheeze. ENT  treats an ulcer she reports in left nostril. Last prednisone 1 month ago.  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 Methotrexate and steroids didn't help this. Sees Dr Lavenia Atlas at [facility] for rheumatolgy. CXR said ok w/in last 6 months. Breathing ok. Has had sinus infection with right retroorbital and maxillary pain x 2 weeks. Does squeeze bottle saline rinse with scant green. No antibiotics.  April 12, 2010- Asthma, allergic rhinitis, dermatomyositis......................Marland Kitchenmother here [place] Abide herself- air movement helps. Hot summer weather was hard on her. Notices most days she will have some productive cough, since sinus infection in March. Then had flu in May despitre flu shot, with myalgias- tested positive at urgent care. They eneded up giving amox for sinuses. Daily wheeze. Uses Ventolin, Symvicort, neb/ albuterol. allergy vaccine is at 1:10.     Asthma History    Initial Asthma Severity Rating:    Age range: 12+ years    Symptoms: 0-2 days/week    Nighttime Awakenings: 3-4/month    Interferes w/ normal activity: minor limitations    SABA use (not for EIB): several times per day    Asthma Severity Assessment: Severe Persistent   Preventive Screening-Counseling & Management  Alcohol-Tobacco     Smoking Status: never  Current Medications (verified): 1)  Effexor 37.5 Mg  Tabs (Venlafaxine Hcl) .... Take 1 Tablet By Mouth Once A Day 2)  Ambien 10 Mg  Tabs (Zolpidem Tartrate) .... Take 1 Tab By Mouth At Bedtime 3)  Neurontin 800 Mg  Tabs (Gabapentin) .... Take  1 Tablet By Mouth Four Times A Day 4)  Lidoderm 5 %  Ptch (Lidocaine) .... Apply 2 Patches To Foot At Bedtime 5)  Oxycontin 60 Mg Xr12h-Tab (Oxycodone Hcl) .... Take 1 By Mouth Two Times A Day 6)  Valium 5 Mg  Tabs (Diazepam) .... Take 1 Tablet By Mouth Three Times A Day 7)  Percocet 7.5-325 Mg  Tabs (Oxycodone-Acetaminophen) .... Take 1 Tablet By Mouth Four Times A Day As Needed 8)  Adult Aspirin Low Strength 81 Mg  Tbdp (Aspirin) .... Take 1 Tablet By Mouth Once A Day 9)  Triamterene-Hctz 37.5-25 Mg  Tabs (Triamterene-Hctz) .... Take 1 Tablet By Mouth Once A Day 10)  Metoprolol Tartrate 25 Mg  Tabs (Metoprolol Tartrate) .... Take 1/2 Tablet Two Times A Day 11)  Omeprazole 20 Mg  Tbec (Omeprazole) .... Take 1 Tablet By Mouth Two Times A Day 12)  Multivitamins   Tabs (Multiple Vitamin) .... Take 1 Tablet By Mouth Once A Day 13)  Ventolin Hfa 108 (90 Base) Mcg/act  Aers (Albuterol Sulfate) .... 2 Puffs Four Times A Day As Needed 14)  Nebulizer Machine For Aerosol Meds 15)  Albuterol Sulfate (2.5 Mg/21ml) 0.083%  Nebu (Albuterol Sulfate) .Marland Kitchen.. 1 Neb Four Times A Day Prn 16)  Allergy Vaccine 1:50 Go (W-E) 17)  Allergy Vaccine Increase To 1:10 Go (W-E) .... Build As Tolerated Per Protocol 18)  Epipen 0.3 Mg/0.28ml Devi (Epinephrine) .... For Severe Allergic Reaction 19)  Symbicort 160-4.5 Mcg/act Aero (Budesonide-Formoterol Fumarate) .... 2 Puffs and Rinse Mouth Twice Daily  Allergies (verified): 1)  ! Imitrex 2)  ! Metrogel (Metronidazole) 3)  !  Buspar 4)  ! Remeron (Mirtazapine) 5)  ! Duragesic-100 6)  ! Opana (Oxymorphone Hcl) 7)  ! Singulair (Montelukast Sodium) 8)  ! Premarin 9)  ! Estrogel (Estradiol)  Past History:  Past Medical History: Last updated: 10/02/2007 Allergic rhinitis Asthma GERD Headache chronic back pain anxiety/depression  Past Surgical History: Last updated: 10/02/2007 Nasal septoplasty/ turbinate reduction Breast biopsy- benign Spine surgeries  Family  History: Last updated: 11/12/2009 Mother- allergic rhinitis, non-Hodgkins Lymphoma Several family with asthma/ allergy Uncle with leukemia Father living  Social History: Last updated: 10/02/2007 Patient never smoked. Disability due to back pain. Previously supervisor transcription, [employer]   Risk Factors: Smoking Status: never (04/12/2010)  Review of Systems      See HPI       The patient complains of shortness of breath with activity, productive cough, and non-productive cough.  The patient denies shortness of breath at rest, coughing up blood, chest pain, irregular heartbeats, acid heartburn, indigestion, loss of appetite, weight change, abdominal pain, difficulty swallowing, sore throat, tooth/dental problems, headaches, nasal congestion/difficulty breathing through nose, and sneezing.    Vital Signs:  Patient profile:   50 year old female Weight:      226 pounds O2 Sat:      96 % on Room air Pulse rate:   103 / minute BP sitting:   128 / 82  (left arm) Cuff size:   regular  Vitals Entered By: Reynaldo Minium CMA (April 12, 2010 2:19 PM)  O2 Flow:  Room air CC: Follow up visit-increased cough(worse with heat);SOB with activity;and wheezing (worse at night).   Physical Exam  Additional Exam:  General: A/Ox3; pleasant and cooperative, NAD, overweight, fanning herself SKIN: Very faint erythema upper arms NODES: no lymphadenopathy HEENT: NECK: Supple w/ fair ROM, JVD- none, normal carotid impulses w/o bruits Thyroid- normal to palpation, dentures, Mallampati  III CHEST: Clear to P&A,ntrace wheeze, dry cough HEART: RRR, no m/g/r heard ABDOMEN: Soft and nl;  ZOX:WRUE, nl pulses, no edema  NEURO: Grossly intact to observation      Impression & Recommendations:  Problem # 1:  ASTHMA (ICD-493.90) Asthmatic bronchitis with residual after rhinosiusitis and bronchitis earlier this spring. She asks about trying prednisone and after discussion, we agreed to try to  clear this.  Problem # 2:  BACK PAIN (ICD-724.5) We have emphasized concerns of steroid sparing management goals.  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 Take 1 by mouth two times a day 2)  Allergy Vaccine 1:10 Go (w-e)  3)  Prednisone 10 Mg Tabs (Prednisone) .Marland Kitchen.. 1 tab four times daily x 2 days, 3 times daily x 2 days, 2 times daily x 2 days, 1 time daily x 2 days  Other Orders: Est. Patient Level IV (45409) Full Pulmonary Function Test (PFT)  Patient Instructions: 1)  Please schedule a follow-up appointment in 1 month. 2)  script for prednisone 10 taper  3)  See PCC to schedue PFT Prescriptions: PREDNISONE 10 MG TABS (PREDNISONE) 1 tab four times daily x 2 days, 3 times daily x 2 days, 2 times daily x 2 days, 1 time daily x 2 days  #20 x o   Entered and Authorized by:   Waymon Budge MD   Signed by:   Waymon Budge MD on 04/12/2010  Method used:   Print then Give to Patient   RxID:   [id number]

## 2010-09-14 NOTE — Progress Notes (Signed)
Summary: NEED ANTIBIOTIC  Phone Note Call from Patient Call back at [phone]   Caller: Patient Call For: YOUNG Summary of Call: PT DIDN'T RECEIVE ANTIBIOTIC  PHARMACY [facility] Initial call taken by: Rickard Patience,  January 23, 2008 8:48 AM  Follow-up for Phone Call        Called pharmacy to verify that they have the RX for the Zpak and they do. Left message on pt's answering machine telling her to call the pharmacy before going to pick up RX to make sure it is ready. Follow-up by: Michel Bickers CMA,  January 23, 2008 9:09 AM

## 2010-09-14 NOTE — Progress Notes (Signed)
Summary: PRESCRIPTION  Phone Note Call from Patient   Caller: Patient Call For: YOUNG Summary of Call: PT WAS TOLD TO MAKE APPT BEFORE FLOVENT PRESCRIPT CAN BE REFILLED . APPT HAS BEEN MADE Initial call taken by: Rickard Patience,  October 23, 2008 9:11 AM  Follow-up for Phone Call        rx sent to pharmacy for 1 month supply only new written rx will be given to pt when she comes for ov per cdy Follow-up by: Philipp Deputy CMA,  October 23, 2008 9:37 AM      Prescriptions: FLOVENT HFA 220 MCG/ACT  AERO (FLUTICASONE PROPIONATE  HFA) 2 puffs two times a day  #1 x 0   Entered by:   Philipp Deputy CMA   Authorized by:   Waymon Budge MD   Signed by:   Philipp Deputy CMA on 10/23/2008   Method used:   Faxed to ...       [facility] [phone]* (retail)       [address] [place], [place]  [address]       Ph: [phone] or [phone]       Fax: [phone]   RxID:   [id number]

## 2010-09-14 NOTE — Miscellaneous (Signed)
Summary: CANCELLED APPT- NO RSC   Pt cancelled 01-30-08 appt with Dr. Maple Hudson; no [facility] made. Reynaldo Minium CMA  January 30, 2008 12:39 PM           Clinical Lists Changes

## 2010-09-27 ENCOUNTER — Ambulatory Visit: Payer: Self-pay | Admitting: Internal Medicine

## 2010-10-26 ENCOUNTER — Ambulatory Visit: Payer: Self-pay | Admitting: Internal Medicine

## 2010-11-11 ENCOUNTER — Ambulatory Visit: Payer: Self-pay | Admitting: Internal Medicine

## 2010-12-09 ENCOUNTER — Ambulatory Visit
Admission: RE | Admit: 2010-12-09 | Discharge: 2010-12-09 | Disposition: A | Discharge status: Home / Self Care | Payer: Medicare Other | Source: Ambulatory Visit | Attending: Anesthesiology | Admitting: Anesthesiology

## 2010-12-09 ENCOUNTER — Other Ambulatory Visit: Payer: Self-pay | Admitting: Anesthesiology

## 2010-12-09 DIAGNOSIS — M2480 Other specific joint derangements of unspecified joint, not elsewhere classified: Secondary | ICD-10-CM

## 2010-12-09 DIAGNOSIS — R52 Pain, unspecified: Secondary | ICD-10-CM

## 2010-12-21 ENCOUNTER — Ambulatory Visit (INDEPENDENT_AMBULATORY_CARE_PROVIDER_SITE_OTHER): Payer: Medicare Other

## 2010-12-21 DIAGNOSIS — J309 Allergic rhinitis, unspecified: Secondary | ICD-10-CM

## 2010-12-28 NOTE — Assessment & Plan Note (Signed)
[facility]                             PULMONARY OFFICE NOTE   RAY, GLACKEN                       MRN:          [id number]  DATE:03/30/2007                            DOB:          1961/01/01    PROBLEM LIST:  1. Recurrent asthma.  2. Allergic rhinitis.  3. Sinusitis.  4. Headache.  5. Back pain.  6. Reflux.   HISTORY OF PRESENT ILLNESS:  Shortness of breath and wheeze with  exertion and notices wheeze when supine. Dry cough. Had endoscopy, she  says was normal but she is aware reflux is worse, having small local  reactions to her allergy vaccine in the last 2 months at maintenance 1  to 50 with no progressive or systemic concern. She considers her asthma  real bad.   MEDICATIONS:  Reviewed and charted.   PHYSICAL EXAMINATION:  VITAL SIGNS:  Weight 224 pounds. Blood pressure  112/84, pulse 82, room air saturation 97%.  LUNGS:  Transient wheeze was heard at the throat and relieved by  distraction, consistent with vocal cord dysfunction. Lung fields were  clear. No sustained stridor. Voice quality was normal.  HEART:  Sounds regular without murmur.   IMPRESSION:  1. Rhinitis with history of rhinosinusitis.  2. 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  3. Esophageal reflux.   PLAN:  Schedule pulmonary function tests, chest x-ray. Hold vaccine at 1  to 50. Schedule return 4 months, earlier p.r.n.     Clinton D. Maple Hudson, MD, Tonny Bollman, FACP  Electronically Signed    CDY/MedQ  DD: 03/31/2007  DT: 04/01/2007  Job #: [id number]   cc:   Dale Dailey

## 2010-12-31 NOTE — Op Note (Signed)
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   PROCEDURE:  1. Lumbar re-exploration with removal of trans-facet screw L4-L5 and L5-S1      on the right with removal of locking caps and rod, L4 to S1 on the      left.  2. Exploration of fusion L4 to S1 bilaterally.  3. Lumbar hemilaminectomy, hemifacetectomy, and foraminotomies at L4-L5      and L5-S1 on the right for decompression of the right sided L4, L5, and      S1 nerve roots requiring more work than is usually required for a      simple PLIF procedure.  4. Posterior lumbar interbody fusion L4-L5 and L5-S1 on the right      utilizing 8 x 22 mm Tangent interbody bone wedges and local autograft      mixed with VITOSS which was soaked with a bone marrow aspirate obtained      through a separate fascial incision.  5. Intertransverse arthrodesis L4 to S1 on the right side utilizing local      autograft mixed with VITOSS and a bone marrow aspirate obtained through      a separate stab incision, all mixed with BMP soaked sponges.  6. Segmental fixation L4 to S1 utilizing bilateral pedicle screw and rod      fixation  7. Harvesting of bone marrow aspirate through a separate fascial incision.   SURGEON:  Tia Alert, M.D.   ASSISTANT:  Donalee Citrin, M.D.   ANESTHESIA:  General endotracheal anesthesia.   COMPLICATIONS:  None apparent.   INDICATIONS FOR PROCEDURE:  Ms. Copen is a 50 year old female who underwent  two previous decompressions by another  surgeon in [place].  She then  underwent a lumbar fusion which consisted of a TLIF with a hybrid construct  consisting of pedicle screws on the left and a transfacet screw on the  right.  She then developed an adjacent level disk and underwent a TLIF at  that level followed by placement of another pedicle screw and another  transfacet screw.  She did quite poorly with this.  She underwent placement  of a spinal cord stimulator and did not do well with this either. She  returned complaining of severe pain.  She had a CT myelogram which showed a  pseudoarthrosis at L4-L5 and L5-S1. She seen to fill her fusion at both  levels.  I recommended lumbar re-exploration with repeat decompression and  instrumented fusion with placement of BMP for redo fusion.  She understood  the risks, benefits, and  expected outcome and wished to proceed.   DESCRIPTION OF PROCEDURE:  The patient was taken to the operating room and  after induction of adequate generalized endotracheal anesthesia, she was  rolled into the prone position on the Wilson frame.  All pressure points  were padded.  The lumbar region was prepped with DuraPrep and draped in the  usual sterile fashion.  10 mL of local anesthesia was injected and then her  old incision was ellipsed out.  I carried the incision down and found the L3  spinous process and I began my dissection through the fascia.  I was able to  expose the transfacet set screws on the right and the pedicle screws on the  left.  I took down the scar tissue in the midline. I continued to dissect. I  removed the transfacet screws on the right.  I removed the locking caps and  the rod from the left.  I then felt she had a very solid fusion, at first.  I continued to dissect and spent quite a bit of time trying to decide  whether she had a solid posterolateral fusion.  I then noticed movement at  L5-S1 and realized she had a pseudoarthrosis, at least at that level.  I  then decided  to proceed with our initial plan. I used a combination of  Leksell rongeur and Kerrison punches to remove the remaining lamina and  perform hemifacetectomies at L4-L5 and L5-S1 on the patient's right side.  The L4, L5, and S1 nerve roots were identified. The yellow ligament was  removed. The lateral recess was decompressed.  I dissected to the medial  pedicle wall at each level.  I coagulated the epidural venous vasculature  and identified the disk spaces, incised each disk space, and performed the  initial diskectomy with pituitary rongeurs. Because she had a TLIF which  brought the PEEK cages to the midline, we had to keep our PLIF at L4-L5 and  L5-S1 lateral in the disk space.  We prepared each disk space with the  rotating cutter and 8 mm cutting chisel.  We packed the midline with local  autograft with a mixture of VITOSS which was soaked with a bone marrow  aspirate obtained through a separate fascial incision over the right iliac  crest.  Then, an 8 x 22 mm Tangent interbody bone wedge was tapped into  position at L4-L5 and L5-S1 to perform PLIF at each level. We checked our  construct with fluoroscopy.  We then localized pedicle screw entry zones at  L4, L5, and S1 on the patient's right side and using surface landmarks and  fluoroscopy, probed each pedicle, tapped each pedicle with a 5/5 tap, and  placed 6.5 by 45 mm pedicle screws into the L4 and L5 pedicles on the right  and 6.5 by 35 mm pedicle screw into the S1 pedicle on the right side.  The  transverse processes were decorticated on the right side and a mixture of  BMP soaked sponges, local autograft, and VITOSS soaked with a bone marrow  aspirate were placed out over these to perform intertransverse arthrodesis  L4 to S1 on the right side.  We then used two lordotic rods and placed these  into the multiaxial screw heads of the pedicle screws and locked these in position with the locking caps and the anti-torque device and  irrigated with  saline solution containing bacitracin.  We inspected our nerve roots once  again, inspected our interbody grafts  once again, inspected our construct  with AP and lateral fluoroscopy.  We then placed a medium Hemovac drain  through a separate stab incision, lined the dura with Gelfoam, and closed  the muscle and fascia with #1 Vicryl, closed the subcutaneous and  subcuticular tissues with 2-0 and 3-0 Vicryl, and closed the skin with  Benzoin and Steri-Strips.  The drapes were removed.  A sterile dressing was  applied.  The patient was awakened from general anesthesia and transferred  to the recovery room in stable condition.  At the end of the procedure, all  sponge, needle and instrument counts were correct.      Tia Alert, MD  Electronically Signed     DSJ/MEDQ  D:  03/15/2006  T:  03/15/2006  Job:  [id number]

## 2010-12-31 NOTE — Op Note (Signed)
[name], [name] NO.:  [id number]   MEDICAL RECORD NO.:  [id number]          PATIENT TYPE:  OIB   LOCATION:  2899                         FACILITY:  [facility]   PHYSICIAN:  Reinaldo Meeker, M.D. DATE OF BIRTH:  [birth year]   DATE OF PROCEDURE:  01/25/2005  DATE OF DISCHARGE:                                 OPERATIVE REPORT   PREOPERATIVE DIAGNOSIS:  Failed back syndrome.   POSTOPERATIVE DIAGNOSIS:  Failed back syndrome.   PROCEDURES:  1.  Permanent spinal cord stimulator implant.  2.  Battery implant.   SURGEON:  Reinaldo Meeker, M.D.   ASSISTANT:  None.   PROCEDURE IN DETAIL:  After being placed in the prone position, the  patient's back and left hip and buttock area were prepped and draped in the  usual sterile fashion.  Using fluoroscopy, the area of T9-10 was identified.  A linear incision was made in the midline above the spinous process of T9  and T10.  Using the Bovie cutting current, the incision was carried down to  the spinous processes.  Subperiosteal dissection was then carried out on the  left-sided spinous processes and lamina.  Self-retaining retractor was  placed for exposure.  X-ray showed the appropriate level.  A left  hemilaminectomy was then performed at T9-10 using a 3 mm Kerrison punch.  The ligamentum flavum and fat were removed until the dura was well  visualized.  A linear incision was then made in the left hip and upper  buttock area and a pocket was made for the spinal battery implant.  The lead  was then placed into position and found to be in good position mostly to the  midline, slightly leaning towards the left or symptomatic side.  This was  felt to be a good placement.  The wound was irrigated.  Gelfoam was placed  around the lead.  The wound was closed up to the fascial layer.  The leads  were secured down to the fascia with two anchors.  Connector wires between  the spinal stimulator and battery pack were passed  subcutaneously without  difficulty and the wires were secured at each end without difficulty.  They  were locked in the appropriate tension.  The battery back was then placed in  its pocket and found to be a good fit.  The wounds were then irrigated and  closed at both areas with interrupted Vicryl on the subcutaneous and  subcuticular tissues and staples on the skin.  Two sterile dressings were  then applied.  The patient was extubated and taken to the recovery room in  stable condition.       ROK/MEDQ  D:  01/25/2005  T:  01/25/2005  Job:  [id number]

## 2010-12-31 NOTE — Op Note (Signed)
NAMEAREIL, OTTEY NO.:  [id number]   MEDICAL RECORD NO.:  [id number]          PATIENT TYPE:  OIB   LOCATION:  2899                         FACILITY:  [facility]   PHYSICIAN:  Reinaldo Meeker, M.D. DATE OF BIRTH:  [birth year]   DATE OF PROCEDURE:  01/14/2005  DATE OF DISCHARGE:                                 OPERATIVE REPORT   PREOPERATIVE DIAGNOSIS:  Failed back syndrome.   POSTOPERATIVE DIAGNOSIS:  Failed back syndrome.   OPERATION PERFORMED:  Spinal cord stimulator trial.   SURGEON:  Reinaldo Meeker, M.D.   DESCRIPTION OF PROCEDURE:  After being placed in the prone position, the  patient's back was prepped and draped in the usual sterile fashion.  A  localizing C-arm in AP direction was brought in to use during the case.  Local anesthetic was then infiltrated at the appropriate entry point between  the first and second lumbar spinous processes.  A small stab wound incision  ws made and Tuohy needle was placed in appropriate direction in the epidural  space.  A catheter was then fed up from the entry area in the epidural space  until it reached approximately the T9 level.  It was quite midline and  posterior which was confirmed by AP and lateral fluoroscopy.  At this time a  trial was carried out and at appropriate settings, the patient felt a  tingling in her back and down both legs which was equaled bilaterally.  This  was felt to be a good placement for a trial.  The stylette of the lead was  then removed following by removing the Tuohy needle.  AP fluoroscopy showed  the lead not to have moved at all at that time.  The lead was then passed to  its anchoring device and the anchoring device was secured to the skin with  two silk stitches. A sterile dressing was then applied and the patient was  taken to recovery room in stable condition.      ROK/MEDQ  D:  01/14/2005  T:  01/14/2005  Job:  [id number]

## 2010-12-31 NOTE — Assessment & Plan Note (Signed)
[facility]                             PULMONARY OFFICE NOTE   [name], [name]                       MRN:          [id number]  DATE:07/14/2006                            DOB:          20-Sep-1960    PULMONARY/ALLERGY FOLLOWUP   PROBLEMS:  1. Recurrent asthma.  2. Allergic rhinitis.  3. Sinusitis.  4. Headache.  5. Back pain.  6. Reflux.   HISTORY:  She continues her own vaccine at 1:50 with no problems.  She  says she is wheezing pretty much constantly now since her most recent  back surgery this summer, and she is having persistent back and leg  pain.  She did not have problems with general anesthesia.  Earlier, she  had had a D&C and thinks she may have had a little trouble with her  breathing during that procedure, because they kept her on oxygen a  little longer.  She did not need prolonged ventilator stay in either  occasion.  She has gone to Urgent Care in [place] since last here for  exacerbations of asthma with bronchitis, and just finished another  course of prednisone.  She is finishing amoxicillin and guaifenesin.  She says reflux symptoms were bad, but are much better controlled.  We  spent time today reviewing the importance of reflux control and the  strong possibility that this is an important part of her asthma.   MEDICATIONS:  1. Effexor XR 300 mg.  2. Wellbutrin XL 150 mg.  3. Topamax 50 mg a.m. 100 mg p.m.  4. Lunesta 3 mg.  5. Neurontin.  6. Lidoderm patch.  7. OxyContin.  8. Valium.  9. Darvocet.  10.Prilosec.  11.Albuterol inhaler.  12.Flovent 110 2 puffs b.i.d.  13.UroXatral 10 mg.  She is off of Singulair and Flonase.  She does have an EpiPen.   OBJECTIVE:  Weight 215 pounds, BP 120/74, pulse regular 103, room air  saturation 100%.  There is mild wheeze peripherally with a tendency to VCD-type squeezing  at the level of the larynx unless I actively encourage her to slow down  and to breathe through  her nose.  Mildly congested sounding cough.  HEART:  Sounds are regular and normal.  There is no neck vein distension or edema.  No visible post-nasal drip.   IMPRESSION:  Asthma.  Allergic rhinitis.   PLAN:  1. Emphysema on reflux management, including elevation of the head of      bed with a brick.  I want her to followup with Dr. Lorin Picket to      discuss.  Continuing Prilosec b.i.d.  2. Flu vaccine and pneumococcal vaccine booster were given.  3. Try Xopenex 1.25 mg neb treatment instead of albuterol to reduce      tremor.  Schedule return in 4 months, earlier p.r.n.     Clinton D. Maple Hudson, MD, Tonny Bollman, FACP  Electronically Signed    CDY/MedQ  DD: 07/14/2006  DT: 07/16/2006  Job #: [id number]   cc:   Dale Walden

## 2010-12-31 NOTE — Assessment & Plan Note (Signed)
[facility]                             PULMONARY OFFICE NOTE   [name], [name]                       MRN:          [id number]  DATE:11/13/2006                            DOB:          Jan 12, 1961    PROBLEM:  1. Recurrent asthma.  2. Allergic rhinitis.  3. Sinusitis.  4. Headache.  5. Back pain.  6. Reflux.   HISTORY:  She went to an urgent care March 17, treated with prednisone,  amoxicillin, Rocephin injection and steroid injection for sinusitis. She  is still blowing green and some blood streaks with bilateral maxillary  pain. She is wheezing more than usual. Had a total hysterectomy for  endometriosis this winter, apparently without respiratory complication.  She brings an updated medication list which is reviewed and charted.   OBJECTIVE:  Weight 218 pounds, BP 110/72, pulse regular rate at 83, room  saturation 99%.  Dry cough with forced expiration, no real wheeze, trace crackles left  back, unlabored, no dullness. There is mild nasal congestion with no  visible mucous or post-nasal drainage. No adenopathy or stridor.   IMPRESSION:  1. Rhinosinusitis with allergic rhinitis, not yet noticing the      seasonal pollen flare.  2. Chronic asthma.   PLAN:  1. She will continue her allergy vaccine. We have refilled her Epipen      with the discussion, but she has had no problems.  2. Saline nasal lavage.  3. Biaxin 500 mg b.i.d. for 10 days.  4. Scheduled to return in 4 months but earlier p.r.n.     Clinton D. Maple Hudson, MD, Tonny Bollman, FACP  Electronically Signed    CDY/MedQ  DD: 11/13/2006  DT: 11/13/2006  Job #: [id number]   cc:   Dale Modoc

## 2010-12-31 NOTE — Op Note (Signed)
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   PROCEDURE:  1. Lumbar laminectomy, hemifacetectomy, and foraminotomy at L4-5 on the left     with repeat diskectomy at L4-5 on the left for recurrent disk herniation.  2. Transforaminal lumbar interbody fusion with a 10 x 25 mm Peek interbody     cage packed with local autograft and Vitoss.  3. Posterolateral fusion L4-5 utilizing local autograft and Vitoss.  4. Nonsegmental fixation L4-5 utilizing Encompass pedicle screw and rod     fixation on the left and a transfacet screw on the right.  5. Intraoperative EMG monitoring.   SURGEON:  Tia Alert, M.D.   ASSISTANT:  Reinaldo Meeker, M.D.   ANESTHESIA:  General endotracheal.   COMPLICATIONS:  None apparent.   INDICATIONS FOR PROCEDURE:  The patient is a 50 year old white female who  had undergone two previous hemilaminectomies and diskectomies at L4-5 on the  left by Dr. Mercy Riding and initially done well and had a recurrence of her left  leg pain.  She had an MRI and then a CT myelogram which showed a recurrent  herniated disk at L4-5 on the left with some cutoff of the nerve roots.  I  recommended a lumbar repeat decompression and instrumented fusion for the  recurrent disk and the failed back syndrome that she had.  She understood  the risks, benefits, and alternatives and wished to proceed.   DESCRIPTION OF PROCEDURE:  The patient was taken to the operating room and  after induction of adequate general endotracheal anesthesia, she was placed  in the prone position on the Wilson frame and all pressure points were  padded.  The lumbar region was prepped with Duraprep and then draped in the  usual sterile fashion.  10 mL of local anesthesia was injected and a dorsal  midline incision was made.  I was able to ellipse out her old scar and I  carried this down to the lumbosacral fascia which was opened and the  paraspinous musculature was taken down in the subperiosteal fashion to  expose the L4-5 interspace.  Intraoperative fluoroscopy confirmed our level.  I then carried the dissection out over the transverse processes of L4 and L5  bilaterally.  I was then able to remove the spinous  process and complete the  hemilaminectomy on the left side utilizing the Kerrison punches.  I  dissected the scar away from the bony edges and completed the laminectomy,  hemifacetectomy, and foraminotomy at L4-5 on the left.  I was able to  dissect through the scar and identify the L4 and the L5 nerve roots and  dissected them out of the foramen and dissected the medial pedicle wall  along each nerve root. There was significant scarring over the dura on the  left side.  I was able to dissect this away.  There was one small area of  thin dura with a small amount of CSF leakage and I was able to sew two  pieces of scar back together to stop this very mild CSF leak.  I was then  able to identify the recurrent disk herniation.  I coagulated the upper  venous vasculature over the disk space and then incised the disk space and  performed a thorough intradiskal diskectomy with a combination of pituitary  rongeurs and curets.  I dissected past the midline with these because we  were going to perform a TLIF.  I was able to complete the diskectomy and  prepare the endplates for fusion.  We then used distractors  to distract the  disk space to 10 mm with a good nice tight fit.  I then after the endplate  preparation used a 10 x 25 mm Peek interbody cage and packed this with local  autograft and Vitoss, and tapped this into position until it was across the  midline.  I then packed around the cage with autograft and Vitoss.  We then  checked this under fluoroscopy to ensure adequate cage placement.  We then  localized the pedicle screw entry zones at L4 and L5 on the left side and we  probed each pedicle, tapped each pedicle with a 5.5 tap, and then placed six  5 x 40 mm pedicle screws into the pedicles of L4 and L5.  We placed a  lordotic rod into the multiaxial screw heads and lined into position after  obtaining compression of the interbody cage.  We then on the patient's right  side, turned our attention to the transfacet screw.  We drilled our entry  zone under fluoroscopic guidance and then used the drill with the K-wire to  drill and then outward in a lateral direction.  Under AP and lateral  fluoroscopy drilled into the pedicle through the facet.  We then used  Duravision EMG monitoring to assure we were not in proximity to the L5 nerve  root on the right side.  We then tapped this area and then used a 35 mm  transfacet screw and placed this under fluoroscopic guidance into the  pedicle of L5 through the facet.  We then decorticated the posterior  elements on the right side and laid a mixture of autograft and Vitoss out  over these to perform a posterolateral fusion at L4-5 on the right side.  We  had irrigated with copious amounts of bacitracin-containing saline solution  prior to decortication and aligning this bone out over the posterior  elements.  We then dried all bleeding points.  We lined the dura with  Duragen and then Tisseel fibrin glue and then lined this with Gelfoam.  We  then closed the fascia with interrupted #1 Vicryl, closed the subcutaneous tissue with 2-0 and 3-0 Vicryl,  and closed the skin with Benzoin and Steri-  Strips.  The  drapes were removed, a sterile dressing was applied, and the  patient was awakened from general anesthesia and transferred to the recovery  room in stable condition.  At the end of the procedure, all needle, sponge,  and instrument counts correct.                                               Tia Alert, MD    DSJ/MEDQ  D:  11/27/2003  T:  11/28/2003  Job:  [id number]

## 2010-12-31 NOTE — Discharge Summary (Signed)
NAMESAHAR, RYBACK NO.:  [id number]   MEDICAL RECORD NO.:  [id number]          PATIENT TYPE:  INP   LOCATION:  3039                         FACILITY:  [facility]   PHYSICIAN:  Tia Alert, MD     DATE OF BIRTH:  June 09, 1961   DATE OF ADMISSION:  03/15/2006  DATE OF DISCHARGE:                                 DISCHARGE SUMMARY   ADMISSION DIAGNOSIS:  Revealed lumbar fusion with pseudoarthrosis L4-5 and  L5-S1.   PROCEDURE:  Lumbar reexploration with repeat decompression and instrumented  fusion L4-S1.   BRIEF HISTORY OF PRESENT ILLNESS:  Ms. Mcquerry is an unfortunate 51 year old  female who has had multiple lumbar spine surgeries who continues to have  significant back pain with left leg pain.  She had a CT myelogram which  showed good filling of the nerve roots but showed a pseudoarthrosis at L4-5  and L5-S1 after two previous lumbar fusions.  I recommended a lumbar  reexploration with repeat fusion with bone morphogenic protein and  replacement of hardware.  She understood the risks, benefits, benefits and  expected outcome and wished to proceed.   HOSPITAL COURSE:  The patient was admitted on March 15, 2006, and taken to  the operating room where she underwent a lumbar reexploration with removal  of hardware, repeat fusion and replacement of hardware L4-S1.  The patient  tolerated the procedure well and was taken to the recovery room and then to  the floor in stable condition.  For details of the operative procedure  please see the dictated operative note.  The patient's hospital course was  routine, there were no complications.  She did have a urinary tract  infection noted on postoperative day #2.  She had urinary retention at home  and had had symptoms for about 2 days prior to admission, she states, with  some dysuria and some frequency and urgency.  Her urologist had asked her to  be discharged with a Foley catheter and leg bag according to her report,  with followup with him for removal of that because of her difficulty with  urination.  Therefore, we replaced her Foley catheter, treated her with  Bactrim, she defervesced.  Her incision remained clean, dry, and intact.  Her strength was good.  Her leg pain was better than it was before surgery.  She had appropriate back soreness.  She was ambulating in the halls without  difficulty and tolerating a regular diet.  She was discharged to home in  stable condition on March 20, 2006.   DISCHARGE MEDICATIONS:  Bactrim DS one p.o. b.i.d. for 3 days.  She was to  continue her pain medicines as she was taking at home as prescribed through  the pain clinic.  We did give her a prescription for Percocet which she was  to check with her pain clinic prior to getting this filled to make sure she  was not breaking a narcotics contract with them.   Her return appointment is in 1 week with Dr. Yetta Barre.   FINAL DIAGNOSIS:  Pseudoarthrosis status post lumbar fusion L4-S1.  Tia Alert, MD  Electronically Signed    DSJ/MEDQ  D:  03/20/2006  T:  03/20/2006  Job:  [id number]

## 2010-12-31 NOTE — Op Note (Signed)
[name], [name]                ACCOUNT NO.:  [id number]   MEDICAL RECORD NO.:  [id number]          PATIENT TYPE:  AMB   LOCATION:  SDS                          FACILITY:  [facility]   PHYSICIAN:  Reinaldo Meeker, M.D. DATE OF BIRTH:  03/18/1961   DATE OF PROCEDURE:  12/15/2005  DATE OF DISCHARGE:                                 OPERATIVE REPORT   PREOPERATIVE DIAGNOSIS:  Failed back syndrome.   POSTOPERATIVE DIAGNOSIS:  Failed back syndrome.   PROCEDURE:  Removal of spinal stimulator battery pack.   SURGEON:  Reinaldo Meeker, M.D.   PROCEDURE IN DETAIL:  After being placed in the prone position, the  patient's left buttock was prepped and draped in the usual sterile fashion.  Previous incision was opened up and using the Bovie cutting current, the  incision was carried down to the battery pack. Using finger dissection, this  was freed of any adhesions in the soft tissues.  The battery pack was then  removed from the pocket.  The wire going into it was disconnected by using  the wrench.  The lead was then wrapped in a small plastic sheath which was  secured around it with a Vicryl tie.  The wire was then placed back deep in  the pocket.  The wound had been irrigated copiously with antibiotic  irrigation and closed in multiple layers of Vicryl in the subcutaneous and  subcuticular tissues and staples were placed on the skin.  A sterile  dressing was then applied and the patient was extubated and taken to the  recovery room in stable condition.           ______________________________  Reinaldo Meeker, M.D.     ROK/MEDQ  D:  12/15/2005  T:  12/16/2005  Job:  [id number]

## 2010-12-31 NOTE — Op Note (Signed)
[name], Petersen                          ACCOUNT NO.:  [id number]   MEDICAL RECORD NO.:  [id number]                   PATIENT TYPE:  INP   LOCATION:  3032                                 FACILITY:  [facility]   PHYSICIAN:  Tia Alert, MD                  DATE OF BIRTH:  [birth year]   DATE OF PROCEDURE:  04/29/2004  DATE OF DISCHARGE:                                 OPERATIVE REPORT   PREOPERATIVE DIAGNOSIS:  Failed back syndrome with back and left leg pain in  an L4 and L5 distribution, status post lumbar fusion, L4-5 and lumbar  hemilaminotomy L5-S1 on the left.   POSTOPERATIVE DIAGNOSIS:  Failed back syndrome with back and left leg pain  in an L4 and L5 distribution, status post lumbar fusion, L4-5 and lumbar  hemilaminotomy L5-S1 on the left.   OPERATION PERFORMED:  1.  Exploration of L4-5 fusion with repeat foraminotomy, L4-5 on the left      for left L5 nerve root decompression and exploration to assure no      residual nerve root compression.  2.  Lumbar repeat hemilaminectomy, hemifacetectomy and foraminotomy, L5-S1      on the left for left L5 and S1 nerve root decompression.  3.  Transforaminal lumbar interbody fusion, L5-S1 from the left utilizing a      10 x 20 mm PEAK interbody cage packed with local autograft and Grafton      putty.  4.  Posterolateral fusion L5-S1 utilizing local autograft and Grafton putty.  5.  Nonsegmental fixation L5-S1 utilizing the Encompass pedicle screw system      on the left and a transfacet screw on the right.  6.  Removal of hardware L4-5 on the left with reinsertion of hardware, L4-5      and L5-S1 on the left.  7.  Intraoperative EMG monitoring utilizing NeuroVision.   SURGEON:  Tia Alert, MD   ASSISTANT:  Reinaldo Meeker, M.D.   ANESTHESIA:  General endotracheal.   COMPLICATIONS:  None apparent.   FINDINGS:  Thickened scar tissue, L4-5 and L5-S1 on the left with midline  recurrent disk herniation, L5-S1 and tethering  of the nerves by epidural  fibrosis, L5 and S1 on the left.   INDICATIONS FOR PROCEDURE:  [name] is a 50 year old white female who had  undergone three previous lumbar surgeries.  She had had a lumbar  microdiskectomy at L4-5 on the left by Dr. __________ .  After failing this,  she underwent a repeat diskectomy at L4-5 on the left with a diskectomy also  at L5-S1 on the left.  She then failed this and a myelogram showed a  recurrent disk herniation at L4-5 on the left.  She then underwent a lumbar  re-exploration and transforaminal lumbar interbody fusion, L4-5 on the left  side.  She continued to have back and significant left leg pain  in both the  L5 and S1 distribution.  Following this, MRI and CT myelogram showed a  midline L5-S1 disk herniation with some deflection of the left S1 nerve  root.  I recommended a lumbar re-exploration to explore the fusion above  with possible diskectomy and fusion L5-S1 on the left.  She understood the  risks and benefits and expected outcome and wished to proceed.   DESCRIPTION OF PROCEDURE:  The patient was taken to the operating room and  after induction of adequate general endotracheal anesthesia, she was rolled  to a prone position on the Wilson frame and all pressure points were padded.  The lumbar region was prepped with DuraPrep and then draped in the usual  sterile fashion.  10 mL of local anesthesia was injected and then a dorsal  midline incision was made through the old incision and carried down to the  lumbosacral fascia.  The fascia was opened and the paraspinous muscles were  taken down in subperiosteal fashion to expose the L4-5 and L5-S1  interspaces.  There was significant scarring because of the previous  surgeries and great care was taken to identify the remaining lamina of L5  and of S1 especially on the left side and to find the hardware at L4-5 on  the left side.  The dissection was carried out over the facet processes of   L5-S1 bilaterally.  It looked like she had a nice bony fusion graft,  posterolaterally at L4-5 on the right side.  The screws were solid.  I  removed the rod at L4-5 on the left and once again checked the screws and  they appeared to be solid within the pedicle.  I dissected through the scar  tissue to identify the L5 nerve root on the left side and performed a wide  foraminotomy over this and I was able to dissect to the medial pedicle wall  and once again was able to check this wall and make sure there was no  breakage of the screw through the medial pedicle wall.  I was able to get up  under the dura and look at the disk space once again.  I did not find any  significant recurrent disk herniation from the lateral recess.  I then  turned my attention to the L5-S1 level.  I removed the spinous process and  performed a repeat hemilaminectomy hemifacetectomy and foraminotomy at L5-S1  on the left.  I dissected out and found the L5 nerve root from below and was  able to de-tether the nerve root from any scar tissue.  There was  significant scarring over the dura and over the nerve root.  This was  removed.  The S1 nerve root also was tethered and scarred down to the  recurrent disk herniation, L5-S1 on the left.  I was able to de-tether this  nerve and perform a wide foraminotomy over the S1 nerve root and dissect to  the medial pedicle wall at this level also.  Once the decompression was  complete, I turned by attention to the TLIF from L5 to S1 on the left side.  I incised the disk space and then I performed initial diskectomy with a  pituitary rongeurs.  I then used a combination of rotating cutters and  curettes to prepare the end plates for arthrodesis.  Once this was done I  distracted the disk space up to 10 mm and checked this under fluoroscopy.  I  was then able to pack  the interspace with local autograft and Grafton putty and then used a 10 x 22 PEAK interbody cage.  I packed this  with local  autograft and Grafton and tapped it into position, performing a TLIF at L5-  S1 from the left side. Once this was completed, I localized the pedicle  screw entry zone at L1 on the left, probed this pedicle, tapped the pedicle  with a 55 tap and then placed a 6.5 x 35 mm pedicle screw into the sacrum on  the left side.  I then used a lordotic rod and placed this into the  multiaxial screw heads and locked this into position with the antitorque  device.  We then turned our attention to the transfacet screw on the right  side.  The entry zone was identified and under fluoroscopic guidance Reinaldo Meeker, M.D. was able to use the hand drill to drill through the facet  in an outward and downward projection into the pedicle.  We then placed a 35  mm transfacet screw on the right side and checked this with EMG monitoring  to assure we were not in proximity to the S1 nerve root on the right side.  We then irrigated with copious amounts of bacitracin containing saline  solution, checked our screw placement once again with AP and lateral  fluoroscopy and then decorticated the lamina of L5 and the sacrum and placed  local autograft and Grafton putty posterolaterally on the right side for  posterolateral arthrodesis.  We then placed a medium Hemovac drain and then  closed the muscle and the fascia with interrupted #1 Vicryl to close the  subcutaneous tissue and subcuticular tissue with 2-0 and 3-0 Vicryl and  closed the skin with Benzoin and Steri-Strips.  The drapes were removed.  A  sterile dressing was applied.  The patient was awakened from general  anesthesia and transported to the recovery room in stable condition.  At the  end of the procedure all sponge, needle and instrument counts were correct.                                               Tia Alert, MD    DSJ/MEDQ  D:  04/29/2004  T:  04/29/2004  Job:  [id number]

## 2010-12-31 NOTE — Discharge Summary (Signed)
NAMEKAMICA, [name] NO.:  [id number]   MEDICAL RECORD NO.:  [id number]          PATIENT TYPE:  INP   LOCATION:  3039                         FACILITY:  [facility]   PHYSICIAN:  Tia Alert, MD     DATE OF BIRTH:  10-31-1960   DATE OF ADMISSION:  03/15/2006  DATE OF DISCHARGE:  03/20/2006                                 DISCHARGE SUMMARY   ADMISSION DIAGNOSIS:  1. Lumbar pseudoarthrosis status post attempted fusion L4-5, L5-S1.      Procedure: Lumbar reexploration with removal of hardware, repeat      posterior lumbar interbody fusion with segmental instrumentation L5-S1.  2. Urinary tract infection.   BRIEF HISTORY OF PRESENT ILLNESS:  [name] is a 50 year old female who  underwent previous lumbar fusion L4-5 and L5-S1.  She had a failed fusion at  each level. She had continued back pain and had failed medical management  including epidural steroid injections and even placement of a spinal cord  stimulator. 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 I recommended lumbar reexploration with removal of transfacet screws.  A posterior lumbar antibody fusion at L4-5 and L5-S1 with placement of  segmental instrumentation L4-S1.  She understood the risks, benefits, and  expected outcome and wished to proceed.   DESCRIPTION OF HER HOSPITAL COURSE:  The patient was admitted on March 15, 2006, and  taken to the operating room where she underwent a lumbar  reexploration with repeat fusion with segmental instrumentation L4-S1.  The  patient tolerated the procedure, was taken to recovery room and then to the  floor in stable condition.  For details of the operative procedure, please  see dictated operative note.   The patient's hospital course was routine except for a urinary tract  infection.  She had known urinary retention since her first microdiskectomy  by another surgeon more than 2 years ago. Her urologist recommended she be  discharged with a Foley catheter to be worked up with urodynamics as an  outpatient according to the patient's report.  Therefore, Foley catheter  remained in place.  When she developed a urinary tract infection, we treated  her with Bactrim and replaced her Foley with a clean Foley. Her incision  remained clean, dry and intact.  She mobilized with physical and  occupational therapy to the point she was ambulating in the halls.  Her pain  was controlled with a Dilaudid PCA for 2 days, and then she was switched  over to oral pain medications which she tolerated well.  She continued to  progress and was discharged to home in stable condition on March 20, 2006,  with plans to follow up with Dr. Yetta Barre in 2 weeks.   FINAL DIAGNOSES:  Lumbar reexploration and repeat fusion L4-S1.      Tia Alert, MD  Electronically Signed     DSJ/MEDQ  D:  04/05/2006  T:  04/05/2006  Job:  [id number]

## 2010-12-31 NOTE — Discharge Summary (Signed)
NAME:  [name], [name]                          ACCOUNT NO.:  [id number]   MEDICAL RECORD NO.:  [id number]                   PATIENT TYPE:  INP   LOCATION:  3025                                 FACILITY:  [facility]   PHYSICIAN:  Tia Alert, MD                  DATE OF BIRTH:  April 08, 1961   DATE OF ADMISSION:  11/27/2003  DATE OF DISCHARGE:  11/30/2003                                 DISCHARGE SUMMARY   ADMITTING DIAGNOSIS:  Failed back syndrome.   PROCEDURE:  Transverse lumbar interbody fusion with instrumentation at L4-5.   BRIEF HISTORY OF PRESENT ILLNESS:  Ms. Marchant is a 50 year old female who  had undergone 2 previous diskectomies at L4-5.  She had significant back  pain with residual left leg pain in an L5 distribution.  She tried medical  management for quite some time including nerve root blocks and pain  management.  She had a myelogram which showed residual and recurrent disk  herniation at L4-5.  I recommended a repeat diskectomy followed by TLIF with  instrumented fusion at L4-5.  She understood the risks, benefits and  alternatives and wishes to proceed.   HOSPITAL COURSE:  The patient was admitted on November 28, 2003 and taken to  the operating room where she underwent a TLIF at L4-5.  The patient  tolerated the procedure well and was taken to the recovery room and then to  the floor in stable condition.  For details of the operative procedure,  please see the dictated operative note.  The patient's hospital course was  routine.  There were no complications.  She remained at bedrest for the  first night and was allowed out of bed the following day.  Her Foley  catheter was discontinued at that time.  She had good strength in her lower  extremities and her left leg pain was gone.  She had appropriate incisional  soreness.  She continued to mobilize during the next couple of days to the  point that she was ambulating in the halls without difficulty, her pain  became more  controlled on oral pain medications, she was afebrile with  stable vital signs.  She was discharged home in stable condition on November 30, 2003.   DISCHARGE MEDICATIONS:  Discharge medications include Percocet and Flexeril.   FOLLOWUP:  Her return appointment is in 2 weeks.   FINAL DIAGNOSIS:  Recurrent disk herniation at L4-5, status post transverse  lumbar interbody fusion with instrumentation at L4-5.                                                Tia Alert, MD    DSJ/MEDQ  D:  12/12/2003  T:  12/12/2003  Job:  [id number]

## 2011-01-03 ENCOUNTER — Encounter: Payer: Self-pay | Admitting: Internal Medicine

## 2011-01-07 ENCOUNTER — Ambulatory Visit: Payer: Self-pay | Admitting: Internal Medicine

## 2011-02-10 ENCOUNTER — Ambulatory Visit: Payer: Self-pay | Admitting: Internal Medicine

## 2011-02-28 ENCOUNTER — Emergency Department: Payer: Self-pay | Admitting: Emergency Medicine

## 2011-03-08 ENCOUNTER — Ambulatory Visit: Payer: Self-pay | Admitting: Internal Medicine

## 2011-03-11 LAB — BASIC METABOLIC PANEL
BUN: 20 mg/dL (ref 4–21)
Creatinine: 1.2 mg/dL — AB (ref <=1.1)
Potassium: 3.1 mmol/L — AB (ref 3.4–5.3)
Sodium: 141 mmol/L (ref 137–147)

## 2011-03-11 LAB — HM COLONOSCOPY

## 2011-03-14 ENCOUNTER — Emergency Department: Payer: Self-pay | Admitting: *Deleted

## 2011-03-17 ENCOUNTER — Ambulatory Visit: Payer: Self-pay | Admitting: Specialist

## 2011-03-25 ENCOUNTER — Ambulatory Visit: Payer: Self-pay | Admitting: Internal Medicine

## 2011-04-06 ENCOUNTER — Ambulatory Visit: Payer: Self-pay | Admitting: Internal Medicine

## 2011-04-16 ENCOUNTER — Ambulatory Visit: Payer: Self-pay | Admitting: Internal Medicine

## 2011-04-29 ENCOUNTER — Ambulatory Visit: Payer: Self-pay | Admitting: Internal Medicine

## 2011-05-16 ENCOUNTER — Ambulatory Visit: Payer: Self-pay | Admitting: Internal Medicine

## 2011-06-06 ENCOUNTER — Ambulatory Visit: Payer: Self-pay | Admitting: Internal Medicine

## 2011-06-15 ENCOUNTER — Other Ambulatory Visit: Payer: Self-pay | Admitting: Internal Medicine

## 2011-06-16 ENCOUNTER — Ambulatory Visit: Payer: Medicare Other | Admitting: Adult Health

## 2011-06-16 ENCOUNTER — Telehealth: Payer: Self-pay | Admitting: Internal Medicine

## 2011-06-16 NOTE — Telephone Encounter (Signed)
[name]  [name]

## 2011-06-27 ENCOUNTER — Encounter: Payer: Self-pay | Admitting: Internal Medicine

## 2011-06-28 ENCOUNTER — Ambulatory Visit: Payer: Medicare Other | Admitting: Internal Medicine

## 2011-07-11 ENCOUNTER — Ambulatory Visit: Payer: Medicare Other | Admitting: Internal Medicine

## 2011-08-03 ENCOUNTER — Ambulatory Visit: Payer: Self-pay | Admitting: Internal Medicine

## 2011-08-03 LAB — HM MAMMOGRAPHY

## 2011-09-26 LAB — HM PAP SMEAR: HM Pap smear: NEGATIVE

## 2012-04-26 ENCOUNTER — Other Ambulatory Visit: Payer: Self-pay | Admitting: Physician Assistant

## 2012-04-26 ENCOUNTER — Ambulatory Visit
Admission: RE | Admit: 2012-04-26 | Discharge: 2012-04-26 | Disposition: A | Discharge status: Home / Self Care | Payer: Medicare Other | Source: Ambulatory Visit | Attending: Physician Assistant | Admitting: Physician Assistant

## 2012-04-26 DIAGNOSIS — R52 Pain, unspecified: Secondary | ICD-10-CM

## 2012-08-10 ENCOUNTER — Other Ambulatory Visit: Payer: Self-pay | Admitting: Internal Medicine

## 2012-08-10 NOTE — Telephone Encounter (Signed)
[facility] [address] [phone] Refill on  Estrogen-methyltestos h.s. Tab  Take 1 tablet by mouth once a day as directed Last refill 07/11/12  Pt has appointmetn with dr Lorin Picket 09/27/12

## 2012-08-13 ENCOUNTER — Other Ambulatory Visit: Payer: Self-pay | Admitting: Internal Medicine

## 2012-08-13 MED ORDER — EST ESTROGENS-METHYLTEST 1.25-2.5 MG PO TABS: 1.0000 | ORAL_TABLET | Freq: Every day | ORAL | Status: DC

## 2012-08-13 NOTE — Telephone Encounter (Signed)
Called prescription in to pharmacy 

## 2012-08-13 NOTE — Telephone Encounter (Signed)
Patient has an appointment on 2.13.14.   Estrogen-Methyltestos H.S Tab  Take 1 tablet by mouth once a day as directed.

## 2012-09-06 ENCOUNTER — Other Ambulatory Visit: Payer: Self-pay | Admitting: Internal Medicine

## 2012-09-06 MED ORDER — ALBUTEROL SULFATE HFA 108 (90 BASE) MCG/ACT IN AERS: 2.0000 | INHALATION_SPRAY | Freq: Four times a day (QID) | RESPIRATORY_TRACT | Status: DC | PRN

## 2012-09-06 NOTE — Telephone Encounter (Signed)
Patient has an appointment on 2.13.14  albuterol (VENTOLIN HFA) 108 (90 BASE) MCG/ACT inhaler

## 2012-09-07 NOTE — Telephone Encounter (Signed)
Prescription already filled

## 2012-09-25 ENCOUNTER — Encounter: Payer: Self-pay | Admitting: Internal Medicine

## 2012-09-27 ENCOUNTER — Ambulatory Visit: Payer: Medicare Other | Admitting: Internal Medicine

## 2012-10-09 ENCOUNTER — Other Ambulatory Visit: Payer: Self-pay | Admitting: *Deleted

## 2012-10-09 MED ORDER — EST ESTROGENS-METHYLTEST 1.25-2.5 MG PO TABS: 1.0000 | ORAL_TABLET | Freq: Every day | ORAL | Status: DC

## 2012-10-09 NOTE — Telephone Encounter (Signed)
Sent in to pharmacy.  

## 2012-10-18 ENCOUNTER — Telehealth: Payer: Self-pay | Admitting: Internal Medicine

## 2012-10-18 MED ORDER — PRAVASTATIN SODIUM 10 MG PO TABS: 10.0000 mg | ORAL_TABLET | Freq: Every day | ORAL | Status: DC

## 2012-10-18 MED ORDER — METOPROLOL TARTRATE 50 MG PO TABS: ORAL_TABLET | ORAL | Status: DC

## 2012-10-18 NOTE — Telephone Encounter (Signed)
Refilled pravastatin and metoprolol

## 2012-11-15 ENCOUNTER — Other Ambulatory Visit: Payer: Self-pay | Admitting: Physician Assistant

## 2012-11-15 ENCOUNTER — Ambulatory Visit
Admission: RE | Admit: 2012-11-15 | Discharge: 2012-11-15 | Disposition: A | Discharge status: Home / Self Care | Payer: Medicare Other | Source: Ambulatory Visit | Attending: Physician Assistant | Admitting: Physician Assistant

## 2012-11-15 DIAGNOSIS — W19XXXA Unspecified fall, initial encounter: Secondary | ICD-10-CM

## 2012-11-15 DIAGNOSIS — R52 Pain, unspecified: Secondary | ICD-10-CM

## 2012-11-26 ENCOUNTER — Encounter: Payer: Self-pay | Admitting: Internal Medicine

## 2012-11-26 ENCOUNTER — Other Ambulatory Visit (HOSPITAL_COMMUNITY)
Admission: RE | Admit: 2012-11-26 | Discharge: 2012-11-26 | Disposition: A | Discharge status: Home / Self Care | Payer: Medicare Other | Source: Ambulatory Visit | Attending: Internal Medicine | Admitting: Internal Medicine

## 2012-11-26 ENCOUNTER — Ambulatory Visit (INDEPENDENT_AMBULATORY_CARE_PROVIDER_SITE_OTHER): Payer: Medicare Other | Admitting: Internal Medicine

## 2012-11-26 VITALS — BP 126/68 | HR 80 | Temp 97.6°F | Resp 18 | Ht 67.0 in | Wt 200.2 lb

## 2012-11-26 DIAGNOSIS — M549 Dorsalgia, unspecified: Secondary | ICD-10-CM

## 2012-11-26 DIAGNOSIS — K52831 Collagenous colitis: Secondary | ICD-10-CM

## 2012-11-26 DIAGNOSIS — F329 Major depressive disorder, single episode, unspecified: Secondary | ICD-10-CM

## 2012-11-26 DIAGNOSIS — F32A Depression, unspecified: Secondary | ICD-10-CM

## 2012-11-26 DIAGNOSIS — R5381 Other malaise: Secondary | ICD-10-CM

## 2012-11-26 DIAGNOSIS — G4733 Obstructive sleep apnea (adult) (pediatric): Secondary | ICD-10-CM

## 2012-11-26 DIAGNOSIS — Z124 Encounter for screening for malignant neoplasm of cervix: Secondary | ICD-10-CM

## 2012-11-26 DIAGNOSIS — J45909 Unspecified asthma, uncomplicated: Secondary | ICD-10-CM

## 2012-11-26 DIAGNOSIS — R51 Headache: Secondary | ICD-10-CM

## 2012-11-26 DIAGNOSIS — Z1239 Encounter for other screening for malignant neoplasm of breast: Secondary | ICD-10-CM

## 2012-11-26 DIAGNOSIS — I10 Essential (primary) hypertension: Secondary | ICD-10-CM

## 2012-11-26 DIAGNOSIS — K5289 Other specified noninfective gastroenteritis and colitis: Secondary | ICD-10-CM

## 2012-11-26 DIAGNOSIS — Z1151 Encounter for screening for human papillomavirus (HPV): Secondary | ICD-10-CM | POA: Insufficient documentation

## 2012-11-26 DIAGNOSIS — K219 Gastro-esophageal reflux disease without esophagitis: Secondary | ICD-10-CM

## 2012-11-26 DIAGNOSIS — F3289 Other specified depressive episodes: Secondary | ICD-10-CM

## 2012-11-26 DIAGNOSIS — E7800 Pure hypercholesterolemia, unspecified: Secondary | ICD-10-CM

## 2012-11-26 DIAGNOSIS — R5383 Other fatigue: Secondary | ICD-10-CM

## 2012-11-26 MED ORDER — ESOMEPRAZOLE MAGNESIUM 40 MG PO PACK: 40.0000 mg | PACK | Freq: Every day | ORAL | Status: DC

## 2012-11-28 ENCOUNTER — Other Ambulatory Visit: Payer: Self-pay | Admitting: Internal Medicine

## 2012-12-03 ENCOUNTER — Encounter: Payer: Self-pay | Admitting: Internal Medicine

## 2012-12-03 DIAGNOSIS — G4733 Obstructive sleep apnea (adult) (pediatric): Secondary | ICD-10-CM | POA: Insufficient documentation

## 2012-12-03 DIAGNOSIS — F32A Depression, unspecified: Secondary | ICD-10-CM | POA: Insufficient documentation

## 2012-12-03 DIAGNOSIS — K52831 Collagenous colitis: Secondary | ICD-10-CM | POA: Insufficient documentation

## 2012-12-03 DIAGNOSIS — E7800 Pure hypercholesterolemia, unspecified: Secondary | ICD-10-CM | POA: Insufficient documentation

## 2012-12-03 DIAGNOSIS — F329 Major depressive disorder, single episode, unspecified: Secondary | ICD-10-CM | POA: Insufficient documentation

## 2012-12-03 DIAGNOSIS — I10 Essential (primary) hypertension: Secondary | ICD-10-CM | POA: Insufficient documentation

## 2012-12-03 NOTE — Assessment & Plan Note (Signed)
Colonoscopy 5/08 normal except internal hemorrhoids.

## 2012-12-03 NOTE — Assessment & Plan Note (Signed)
Blood pressure has been under good control.  Same medication regimen.  Check metabolic panel.   Follow.   

## 2012-12-03 NOTE — Assessment & Plan Note (Signed)
Some intermittent wheezing and congestion as outlined.  Start steroid inhaler as directed.  mucinex as needed.  Follow.

## 2012-12-03 NOTE — Assessment & Plan Note (Addendum)
Stable on current regimen.  Follow.  Have discussed with her the need for psych referral.    

## 2012-12-03 NOTE — Assessment & Plan Note (Signed)
Continue on pravastatin.  Low cholesterol diet.  Check lipid panel and liver function.   

## 2012-12-03 NOTE — Assessment & Plan Note (Signed)
Reflux symptoms and dysphagia as outlined.  Plans to see GI this week.  Can start protonix.  Will discuss with GI regarding EGD.

## 2012-12-03 NOTE — Progress Notes (Signed)
Subjective:    Patient ID: [name], female    DOB: [birth year], 52 y.o.   MRN: [id number]  HPI 52 year old female with past history of asthma, IBS, migraine headaches, depression and chronic back pain s/p multiple back surgeries.  She comes in today to follow up on these issues as well as for a complete physical exam.  She is accompanied by her husband.  History obtained from both of them. She is followed at the pain clinic.  Feels the pain in her back is worse.  Taking multiple pain meds.  Stays in bed most of the day.  Has fallen recently (approximately 3/26).  States her gown got caught on a drawer and she tripped over a rug).  She also reports that she will fall asleep while using the bathroom.  We discussed that she is on multiple sedating medications.  Discussed the need to try to taper off (or at least down) on some of these medications.  She has noticed some increased acid reflux.  Feels like hot water.  Occasionally will notice food "hanging".  Is due to see Fransico Setters (GI) this week.  Is planning for a follow up colonoscopy.  Is going to discuss with her regarding her upper symptoms (with the question of also doing an EGD with the next colonoscopy).  Has issues with constipation.  Discussed that her pain meds can slow down her bowels.  Discussed multiple treatment options - including increased fluid intake, miralax regularly and enema/suppositories prn.  Plans to discuss this with GI as well.     Past Medical History  Diagnosis Date  . Allergic rhinitis   . Asthma   . GERD (gastroesophageal reflux disease)   . Depression   . Anxiety   . Chronic back pain   . IBS (irritable bowel syndrome)   . Hypertension   . Sleep apnea   . Collagenous colitis     followed by Dr Markham Jordan  . Bronchitis, chronic   . Urine incontinence   . Migraine headache   . Ulcer disease   . Fibrocystic breast disease   . History of colon polyps      Outpatient Encounter Prescriptions as of 11/26/2012   Medication Sig Dispense Refill  . albuterol (VENTOLIN HFA) 108 (90 BASE) MCG/ACT inhaler Inhale 2 puffs into the lungs every 6 (six) hours as needed.  6.7 g  1  . aspirin 81 MG tablet Take 81 mg by mouth daily.        . budesonide-formoterol (SYMBICORT) 160-4.5 MCG/ACT inhaler Inhale 2 puffs into the lungs 2 (two) times daily.        . diazepam (VALIUM) 5 MG tablet Take 5 mg by mouth 3 (three) times daily as needed.        Marland Kitchen EPINEPHrine (EPIPEN JR) 0.15 MG/0.3ML injection Inject 0.15 mg into the muscle as needed.        Marland Kitchen estrogen-methylTESTOSTERone (ESTRATEST) 1.25-2.5 MG per tablet Take 1 tablet by mouth daily.  30 tablet  1  . metoprolol (LOPRESSOR) 50 MG tablet 1/2 tablet bid  90 tablet  0  . Multiple Vitamin (MULTIVITAMIN) capsule Take 1 capsule by mouth daily.        Marland Kitchen omeprazole (PRILOSEC) 20 MG capsule Take 20 mg by mouth 2 (two) times daily.        . Oxycodone HCl (OXYCONTIN) 60 MG TB12 Take 1 tablet by mouth 3 (three) times daily.        Marland Kitchen oxyCODONE-acetaminophen (TYLOX)  5-500 MG per capsule Take 1 capsule by mouth 2 (two) times daily as needed.        . pantoprazole (PROTONIX) 40 MG tablet Take 40 mg by mouth daily.      Marland Kitchen triamterene-hydrochlorothiazide (MAXZIDE-25) 37.5-25 MG per tablet Take 1 tablet by mouth daily.        Marland Kitchen venlafaxine (EFFEXOR) 37.5 MG tablet Take 37.5 mg by mouth daily.        Marland Kitchen zolpidem (AMBIEN) 10 MG tablet Take 10 mg by mouth at bedtime as needed.        . [DISCONTINUED] pravastatin (PRAVACHOL) 10 MG tablet Take 1 tablet (10 mg total) by mouth daily.  90 tablet  0  . esomeprazole (NEXIUM) 40 MG packet Take 40 mg by mouth daily before breakfast.  30 each  2  . lidocaine (LIDODERM) 5 % Place 2 patches onto the skin daily. Remove & Discard patch within 12 hours or as directed by MD        No facility-administered encounter medications on file as of 11/26/2012.    Review of Systems Has a history of migraine headaches.  Appears to be stable. No significant  sinus problems currently.  No chest pain, tightness or palpitations.  Has noticed some congestion and wheezing lately.  No nausea or vomiting.  Acid reflux symptoms as outlined.  Some dysphagia.  No abdominal pain or cramping.  No BRBPR or melana.  Having some problems with constipation as outlined.   Still having issues with her bladder.  Urinary hesitancy.  Has seen urology.      Objective:   Physical Exam Filed Vitals:   11/26/12 1140  BP: 126/68  Pulse: 80  Temp: 97.6 F (36.4 C)  Resp: 52   52 year old female in no acute distress.   HEENT:  Nares- clear.  Oropharynx - without lesions. NECK:  Supple.  Nontender.  No audible bruit.  HEART:  Appears to be regular. LUNGS:  No crackles or wheezing audible.  Respirations even and unlabored.  RADIAL PULSE:  Equal bilaterally.    BREASTS:  No nipple discharge or nipple retraction present.  Could not appreciate any distinct nodules or axillary adenopathy.  ABDOMEN:  Soft, nontender.  Bowel sounds present and normal.  No audible abdominal bruit.  GU:  Normal external genitalia.  Vaginal vault without lesions.  S/p supracervical hysterectomy.   Pap performed.  Could not appreciate any adnexal masses or tenderness.   RECTAL:  Heme negative.   EXTREMITIES:  No increased edema present.  DP pulses palpable and equal bilaterally.          Assessment & Plan:  CARDIOVASCULAR.  Heart catheterization 6/10 revealed normal coronaries.  Currently asymptomatic.   FATIGUE.  Probably multifactorial.  On multiple sedating meds.  Has know sleep apnea and declines CPAP.  Does not exercise.  Check cbc, met c and tsh.   HEALTH MAINTENANCE.  Physical today.  Schedule mammogram.  Colonoscopy 5/08 normal except for internal hemorrhoids.   I spent over 40 minutes with this patient and more than 50% of the time was spent in consultation regarding the above.

## 2012-12-03 NOTE — Assessment & Plan Note (Signed)
Chronic.  Followed by pain clinic.  On multiple meds.  See above discussion for side effects and need for taper if possible.

## 2012-12-03 NOTE — Assessment & Plan Note (Signed)
History of migraine headaches.  Appears to be stable.  Follow.

## 2012-12-03 NOTE — Assessment & Plan Note (Signed)
Declines CPAP

## 2012-12-10 ENCOUNTER — Other Ambulatory Visit: Payer: Medicare Other

## 2012-12-12 ENCOUNTER — Other Ambulatory Visit: Payer: Self-pay | Admitting: *Deleted

## 2012-12-12 MED ORDER — EST ESTROGENS-METHYLTEST 1.25-2.5 MG PO TABS: 1.0000 | ORAL_TABLET | Freq: Every day | ORAL | Status: DC

## 2012-12-12 NOTE — Telephone Encounter (Signed)
Faxed Estrogen-methylTestosterone (Estratest) 1.25-2.5 mg  prescription to [facility]

## 2012-12-17 ENCOUNTER — Other Ambulatory Visit: Payer: Medicare Other

## 2012-12-26 ENCOUNTER — Other Ambulatory Visit: Payer: Medicare Other

## 2013-01-21 ENCOUNTER — Other Ambulatory Visit: Payer: Medicare Other

## 2013-02-20 ENCOUNTER — Other Ambulatory Visit: Payer: Self-pay | Admitting: Internal Medicine

## 2013-02-25 ENCOUNTER — Other Ambulatory Visit: Payer: Self-pay | Admitting: Internal Medicine

## 2013-03-02 ENCOUNTER — Other Ambulatory Visit: Payer: Self-pay | Admitting: Internal Medicine

## 2013-03-04 NOTE — Telephone Encounter (Signed)
Eprescribed.

## 2013-03-22 ENCOUNTER — Other Ambulatory Visit: Payer: Medicare Other

## 2013-03-28 ENCOUNTER — Ambulatory Visit: Payer: Medicare Other | Admitting: Internal Medicine

## 2013-04-30 ENCOUNTER — Other Ambulatory Visit: Payer: Self-pay | Admitting: Internal Medicine

## 2013-04-30 NOTE — Telephone Encounter (Signed)
Last labs 2012. Has no showed 3 appointments. Appt scheduled 05/28/13. Ok to refill one more month?

## 2013-04-30 NOTE — Telephone Encounter (Signed)
If she has not had labs since 2012, she needs to come in for the ordered labs prior to any more refills.

## 2013-04-30 NOTE — Telephone Encounter (Signed)
Pt declined to have lab work drawn earlier than her already scheduled appointment on 05/22/13. States she has not had labs drawn elsewhere through employer or another doctor, most recent labs are the 2012. Refill denied, pt verbalized understanding.

## 2013-05-15 ENCOUNTER — Other Ambulatory Visit: Payer: Self-pay | Admitting: Internal Medicine

## 2013-05-22 ENCOUNTER — Other Ambulatory Visit (INDEPENDENT_AMBULATORY_CARE_PROVIDER_SITE_OTHER): Payer: Medicare Other

## 2013-05-22 DIAGNOSIS — R5381 Other malaise: Secondary | ICD-10-CM

## 2013-05-22 DIAGNOSIS — E7800 Pure hypercholesterolemia, unspecified: Secondary | ICD-10-CM

## 2013-05-22 DIAGNOSIS — I10 Essential (primary) hypertension: Secondary | ICD-10-CM

## 2013-05-22 DIAGNOSIS — R5383 Other fatigue: Secondary | ICD-10-CM

## 2013-05-22 LAB — CBC WITH DIFFERENTIAL/PLATELET
Basophils Absolute: 0 10*3/uL (ref 0.0–0.1)
Basophils Relative: 0.3 % (ref 0.0–3.0)
Eosinophils Absolute: 1.1 10*3/uL — ABNORMAL HIGH (ref 0.0–0.7)
Eosinophils Relative: 19.2 % — ABNORMAL HIGH (ref 0.0–5.0)
HCT: 39.4 % (ref 36.0–46.0)
Hemoglobin: 13 g/dL (ref 12.0–15.0)
Lymphocytes Relative: 34.3 % (ref 12.0–46.0)
Lymphs Abs: 2 10*3/uL (ref 0.7–4.0)
MCHC: 32.9 g/dL (ref 30.0–36.0)
MCV: 89 fl (ref 78.0–100.0)
Monocytes Absolute: 0.4 10*3/uL (ref 0.1–1.0)
Monocytes Relative: 7.1 % (ref 3.0–12.0)
Neutro Abs: 2.2 10*3/uL (ref 1.4–7.7)
Neutrophils Relative %: 39.1 % — ABNORMAL LOW (ref 43.0–77.0)
Platelets: 285 10*3/uL (ref 150.0–400.0)
RBC: 4.43 Mil/uL (ref 3.87–5.11)
RDW: 13.5 % (ref 11.5–14.6)
WBC: 5.7 10*3/uL (ref 4.5–10.5)

## 2013-05-23 LAB — HEPATIC FUNCTION PANEL
ALT: 17 U/L (ref 0–35)
AST: 20 U/L (ref 0–37)
Albumin: 3.5 g/dL (ref 3.5–5.2)
Alkaline Phosphatase: 64 U/L (ref 39–117)
Bilirubin, Direct: 0.1 mg/dL (ref 0.0–0.3)
Total Bilirubin: 0.6 mg/dL (ref 0.3–1.2)
Total Protein: 6.1 g/dL (ref 6.0–8.3)

## 2013-05-23 LAB — LIPID PANEL
Cholesterol: 167 mg/dL (ref 0–200)
HDL: 37.7 mg/dL — ABNORMAL LOW (ref >39.00)
LDL Cholesterol: 112 mg/dL — ABNORMAL HIGH (ref 0–99)
Total CHOL/HDL Ratio: 4
Triglycerides: 86 mg/dL (ref 0.0–149.0)
VLDL: 17.2 mg/dL (ref 0.0–40.0)

## 2013-05-23 LAB — BASIC METABOLIC PANEL
BUN: 12 mg/dL (ref 6–23)
CO2: 24 mEq/L (ref 19–32)
Calcium: 9.1 mg/dL (ref 8.4–10.5)
Chloride: 111 mEq/L (ref 96–112)
Creatinine, Ser: 1 mg/dL (ref 0.4–1.2)
GFR: 64.31 mL/min (ref >60.00)
Glucose, Bld: 88 mg/dL (ref 70–99)
Potassium: 3.8 mEq/L (ref 3.5–5.1)
Sodium: 143 mEq/L (ref 135–145)

## 2013-05-23 LAB — TSH: TSH: 0.39 u[IU]/mL (ref 0.35–5.50)

## 2013-05-27 ENCOUNTER — Ambulatory Visit: Payer: Self-pay | Admitting: Internal Medicine

## 2013-05-28 ENCOUNTER — Ambulatory Visit (INDEPENDENT_AMBULATORY_CARE_PROVIDER_SITE_OTHER): Payer: Medicare Other | Admitting: Internal Medicine

## 2013-05-28 ENCOUNTER — Encounter: Payer: Self-pay | Admitting: Internal Medicine

## 2013-05-28 VITALS — BP 120/70 | HR 75 | Temp 98.2°F | Ht 67.0 in | Wt 192.5 lb

## 2013-05-28 DIAGNOSIS — R5381 Other malaise: Secondary | ICD-10-CM

## 2013-05-28 DIAGNOSIS — K5289 Other specified noninfective gastroenteritis and colitis: Secondary | ICD-10-CM

## 2013-05-28 DIAGNOSIS — Z23 Encounter for immunization: Secondary | ICD-10-CM

## 2013-05-28 DIAGNOSIS — F329 Major depressive disorder, single episode, unspecified: Secondary | ICD-10-CM

## 2013-05-28 DIAGNOSIS — K219 Gastro-esophageal reflux disease without esophagitis: Secondary | ICD-10-CM

## 2013-05-28 DIAGNOSIS — F32A Depression, unspecified: Secondary | ICD-10-CM

## 2013-05-28 DIAGNOSIS — M3313 Other dermatomyositis without myopathy: Secondary | ICD-10-CM

## 2013-05-28 DIAGNOSIS — G4733 Obstructive sleep apnea (adult) (pediatric): Secondary | ICD-10-CM

## 2013-05-28 DIAGNOSIS — R413 Other amnesia: Secondary | ICD-10-CM

## 2013-05-28 DIAGNOSIS — R5383 Other fatigue: Secondary | ICD-10-CM

## 2013-05-28 DIAGNOSIS — K52831 Collagenous colitis: Secondary | ICD-10-CM

## 2013-05-28 DIAGNOSIS — F3289 Other specified depressive episodes: Secondary | ICD-10-CM

## 2013-05-28 DIAGNOSIS — J45909 Unspecified asthma, uncomplicated: Secondary | ICD-10-CM

## 2013-05-28 DIAGNOSIS — R339 Retention of urine, unspecified: Secondary | ICD-10-CM

## 2013-05-28 DIAGNOSIS — M549 Dorsalgia, unspecified: Secondary | ICD-10-CM

## 2013-05-28 DIAGNOSIS — E7800 Pure hypercholesterolemia, unspecified: Secondary | ICD-10-CM

## 2013-05-28 DIAGNOSIS — M3390 Dermatopolymyositis, unspecified, organ involvement unspecified: Secondary | ICD-10-CM

## 2013-05-28 DIAGNOSIS — R51 Headache: Secondary | ICD-10-CM

## 2013-05-28 DIAGNOSIS — I10 Essential (primary) hypertension: Secondary | ICD-10-CM

## 2013-05-28 NOTE — Assessment & Plan Note (Signed)
Continue on pravastatin.  Low cholesterol diet.  Check lipid panel and liver function.   

## 2013-05-28 NOTE — Assessment & Plan Note (Addendum)
Reflux symptoms and dysphagia as outlined.  Plans to see GI end of the month.  Can start protonix.  Will discuss with GI regarding EGD.

## 2013-05-28 NOTE — Assessment & Plan Note (Addendum)
History of migraine headaches.  Migraines appear to be stable.  She is having the increased confusion and feels she is not thinking as clearly.  Also, has noticed some memory issues.  Will check CT Head.  Unable to do MRI.  Discussed decreasing pain medications.  Follow.

## 2013-05-28 NOTE — Assessment & Plan Note (Signed)
Blood pressure has been under good control.  Same medication regimen.  Check metabolic panel.   Follow.   

## 2013-05-28 NOTE — Assessment & Plan Note (Signed)
Declines CPAP

## 2013-05-29 ENCOUNTER — Encounter: Payer: Self-pay | Admitting: Internal Medicine

## 2013-05-29 LAB — VITAMIN B12: Vitamin B-12: 1369 pg/mL — ABNORMAL HIGH (ref 211–911)

## 2013-05-29 LAB — SEDIMENTATION RATE: Sed Rate: 2 mm/hr (ref 0–22)

## 2013-05-29 LAB — TSH: TSH: 0.69 u[IU]/mL (ref 0.35–5.50)

## 2013-05-29 NOTE — Assessment & Plan Note (Signed)
Colonoscopy 5/08 normal except internal hemorrhoids.  Overdue colonoscopy.  appt with GI end of the month.    

## 2013-05-29 NOTE — Assessment & Plan Note (Signed)
Chronic.  Followed by pain clinic.  On multiple meds.  See above discussion for side effects and need for taper if possible.

## 2013-05-29 NOTE — Assessment & Plan Note (Signed)
Some intermittent wheezing and congestion.  Steroid inhaler regularly.  Follow.

## 2013-05-29 NOTE — Progress Notes (Signed)
Subjective:    Patient ID: [name], female    DOB: 04/09/1961, 52 y.o.   MRN: [id number]  HPI 52 year old female with past history of asthma, IBS, migraine headaches, depression and chronic back pain s/p multiple back surgeries.  She comes in today for a scheduled follow up.  She is accompanied by her husband.  History obtained from both of them.  She is followed at the pain clinic. Taking multiple pain meds.  Stays in bed most of the day.  Has fallen intermittently.   She also reports that she will fall asleep while using the bathroom.  We discussed that she is on multiple sedating medications.  Discussed the need to try to taper off (or at least down) on some of these medications.  She has noticed some increased acid reflux.  Feels like hot water.  Occasionally will notice food "hanging".  Has not seen GI yet.  Was due to see them right after her last visit, but did not keep the appt.  She is scheduled to follow up with them at the end of this month.  Is planning for a follow up colonoscopy.  Is going to discuss with her regarding her upper symptoms (with the question of also doing an EGD with the next colonoscopy).   Has had  issues with constipation.  Feels this is better now.  She reports increased fatigue.  Increased joint and muscle aches.  Has been diagnosed with dermatomyositis.  She also reports some change in memory.  Some confusion intermittently.  No acute neurological change.  No change in headaches.  No sinus congestion.     Past Medical History  Diagnosis Date  . Allergic rhinitis   . Asthma   . GERD (gastroesophageal reflux disease)   . Depression   . Anxiety   . Chronic back pain   . IBS (irritable bowel syndrome)   . Hypertension   . Sleep apnea   . Collagenous colitis     followed by Dr Markham Jordan  . Bronchitis, chronic   . Urine incontinence   . Migraine headache   . Ulcer disease   . Fibrocystic breast disease   . History of colon polyps     Outpatient Encounter  Prescriptions as of 05/28/2013  Medication Sig Dispense Refill  . albuterol (VENTOLIN HFA) 108 (90 BASE) MCG/ACT inhaler Inhale 2 puffs into the lungs every 6 (six) hours as needed.  6.7 g  1  . aspirin 81 MG tablet Take 81 mg by mouth daily.        . bisacodyl (DULCOLAX) 5 MG EC tablet Take 5 mg by mouth daily as needed for constipation.      . budesonide-formoterol (SYMBICORT) 160-4.5 MCG/ACT inhaler Inhale 2 puffs into the lungs 2 (two) times daily.        . diazepam (VALIUM) 5 MG tablet Take 5 mg by mouth 3 (three) times daily as needed.        Marland Kitchen estrogen-methylTESTOSTERone (ESTRATEST) 1.25-2.5 MG per tablet TAKE 1 TABLET BY MOUTH EVERY DAY  30 tablet  0  . metoprolol (LOPRESSOR) 50 MG tablet TAKE 1/2 TABLET BY MOUTH TWICE A DAY  30 tablet  5  . Multiple Vitamin (MULTIVITAMIN) capsule Take 1 capsule by mouth daily.        Marland Kitchen NEXIUM 40 MG capsule TAKE 1 CAPSULE EVERY DAY BEFORE BREAKFAST  30 capsule  5  . omeprazole (PRILOSEC) 20 MG capsule Take 20 mg by mouth 2 (two) times  daily.        Marland Kitchen OVER THE COUNTER MEDICATION i-cool (for hot flashes)      . oxyCODONE-acetaminophen (TYLOX) 5-500 MG per capsule Take 1 capsule by mouth 2 (two) times daily as needed.        . pravastatin (PRAVACHOL) 10 MG tablet TAKE 1 TABLET BY MOUTH ONCE A DAY  30 tablet  1  . venlafaxine (EFFEXOR) 37.5 MG tablet Take 37.5 mg by mouth daily.        Marland Kitchen zolpidem (AMBIEN) 10 MG tablet Take 10 mg by mouth at bedtime as needed.        . [DISCONTINUED] EPINEPHrine (EPIPEN JR) 0.15 MG/0.3ML injection Inject 0.15 mg into the muscle as needed.        . [DISCONTINUED] lidocaine (LIDODERM) 5 % Place 2 patches onto the skin daily. Remove & Discard patch within 12 hours or as directed by MD       . [DISCONTINUED] Oxycodone HCl (OXYCONTIN) 60 MG TB12 Take 1 tablet by mouth 3 (three) times daily.        . [DISCONTINUED] pantoprazole (PROTONIX) 40 MG tablet Take 40 mg by mouth daily.      . [DISCONTINUED] triamterene-hydrochlorothiazide  (MAXZIDE-25) 37.5-25 MG per tablet Take 1 tablet by mouth daily.         No facility-administered encounter medications on file as of 05/28/2013.    Review of Systems Has a history of migraine headaches.  Appears to be stable.  She does describe the trouble with her memory.  Also some "increased confusion".  No significant sinus problems currently.  No chest pain, tightness or palpitations.  Has noticed some congestion and wheezing lately.   No nausea or vomiting.  Acid reflux symptoms as outlined.  Some dysphagia.  No abdominal pain or cramping.  No BRBPR or melana.  Having some problems with constipation as outlined.  Better.   Still having issues with her bladder.  Urinary hesitancy.  Has seen urology.  Overdue follow up.       Objective:   Physical Exam  Filed Vitals:   05/28/13 1609  BP: 120/70  Pulse: 75  Temp: 98.2 F (50.64 C)   52 year old female in no acute distress.   HEENT:  Nares- clear.  Oropharynx - without lesions. NECK:  Supple.  Nontender.  No audible bruit.  HEART:  Appears to be regular. LUNGS:  No crackles or wheezing audible.  Respirations even and unlabored.  RADIAL PULSE:  Equal bilaterally.    ABDOMEN:  Soft, nontender.  Bowel sounds present and normal.  No audible abdominal bruit.   EXTREMITIES:  No increased edema present.  DP pulses palpable and equal bilaterally.          Assessment & Plan:  CARDIOVASCULAR.  Heart catheterization 6/10 revealed normal coronaries.  Currently asymptomatic.   FATIGUE.  Probably multifactorial.  On multiple sedating meds.  Has known sleep apnea and declines CPAP.  Does not exercise.  Check cbc, met c and tsh.   HEALTH MAINTENANCE.  Physical last visit.  .  Was scheduled for a  Mammogram last visit.  Apparently did not keep appt.  Will need to reschedule.    Colonoscopy 5/08 normal except for internal hemorrhoids.   I spent over 40 minutes with this patient and more than 50% of the time was spent in consultation regarding the  above.

## 2013-05-29 NOTE — Assessment & Plan Note (Signed)
Stable on current regimen.  Follow.  Have discussed with her the need for psych referral.    

## 2013-05-29 NOTE — Assessment & Plan Note (Signed)
Increased joint and muscle pains.  States has not seen Dr Gavin Potters lately.  Will check ESR.  Schedule follow up appt with Dr Gavin Potters.

## 2013-05-30 ENCOUNTER — Encounter: Payer: Self-pay | Admitting: Internal Medicine

## 2013-05-31 ENCOUNTER — Ambulatory Visit: Payer: Self-pay | Admitting: Internal Medicine

## 2013-06-03 ENCOUNTER — Telehealth: Payer: Self-pay | Admitting: *Deleted

## 2013-06-03 DIAGNOSIS — R413 Other amnesia: Secondary | ICD-10-CM

## 2013-06-03 NOTE — Telephone Encounter (Signed)
Pt lvm stating she wants to see someone at [facility], Dr. Anne Hahn.

## 2013-06-03 NOTE — Telephone Encounter (Signed)
Pt notified that Head CT was negative. Given sx's of memory issues, feeling confused, etc.... Pt agreed to proceed with Neurology referral. (Copy of report placed in Ambers basket)

## 2013-06-04 NOTE — Telephone Encounter (Signed)
Order placed for referral to neurology 

## 2013-06-11 ENCOUNTER — Encounter: Payer: Self-pay | Admitting: Internal Medicine

## 2013-06-18 ENCOUNTER — Institutional Professional Consult (permissible substitution): Payer: Self-pay | Admitting: Neurology

## 2013-06-18 ENCOUNTER — Encounter: Payer: Self-pay | Admitting: Internal Medicine

## 2013-06-18 ENCOUNTER — Telehealth: Payer: Self-pay | Admitting: Neurology

## 2013-06-18 NOTE — Telephone Encounter (Signed)
This patient did not show up for the appointment today. 

## 2013-06-20 ENCOUNTER — Other Ambulatory Visit: Payer: Self-pay | Admitting: Internal Medicine

## 2013-06-21 NOTE — Telephone Encounter (Signed)
Eprescribed.

## 2013-07-01 ENCOUNTER — Ambulatory Visit: Payer: Self-pay | Admitting: Neurology

## 2013-07-18 ENCOUNTER — Other Ambulatory Visit: Payer: Self-pay | Admitting: Internal Medicine

## 2013-07-19 NOTE — Telephone Encounter (Signed)
Ok to refill 

## 2013-07-19 NOTE — Telephone Encounter (Signed)
Pt states that she has been taking it regularly & she is aware that Rx was sent in.

## 2013-07-19 NOTE — Telephone Encounter (Signed)
I refilled the estrogen #30 with one refill.  Please confirm with pt that she has been taking it regularly.

## 2013-08-07 ENCOUNTER — Other Ambulatory Visit: Payer: Self-pay | Admitting: Internal Medicine

## 2013-08-24 ENCOUNTER — Other Ambulatory Visit: Payer: Self-pay | Admitting: Internal Medicine

## 2013-08-30 ENCOUNTER — Ambulatory Visit: Payer: Medicare Other | Admitting: Internal Medicine

## 2013-09-10 ENCOUNTER — Other Ambulatory Visit: Payer: Self-pay | Admitting: Internal Medicine

## 2013-09-19 ENCOUNTER — Other Ambulatory Visit: Payer: Self-pay | Admitting: Internal Medicine

## 2013-09-20 NOTE — Telephone Encounter (Signed)
She no showed for her last appt with me.  Needs a physical after 11/26/13.  Ok to refill x 3.  Needs to keep physical appt.

## 2013-09-20 NOTE — Telephone Encounter (Signed)
Ok refill? 

## 2013-09-20 NOTE — Telephone Encounter (Signed)
Sent pt myChart message.

## 2013-09-23 NOTE — Telephone Encounter (Signed)
Mailed unread message to pt  

## 2013-10-29 ENCOUNTER — Other Ambulatory Visit: Payer: Self-pay | Admitting: Internal Medicine

## 2013-10-31 DIAGNOSIS — R3916 Straining to void: Secondary | ICD-10-CM | POA: Insufficient documentation

## 2013-10-31 DIAGNOSIS — N390 Urinary tract infection, site not specified: Secondary | ICD-10-CM | POA: Insufficient documentation

## 2013-10-31 DIAGNOSIS — R339 Retention of urine, unspecified: Secondary | ICD-10-CM | POA: Insufficient documentation

## 2013-10-31 DIAGNOSIS — N3281 Overactive bladder: Secondary | ICD-10-CM | POA: Insufficient documentation

## 2013-11-15 ENCOUNTER — Encounter: Payer: Self-pay | Admitting: Neurology

## 2013-11-15 ENCOUNTER — Ambulatory Visit (INDEPENDENT_AMBULATORY_CARE_PROVIDER_SITE_OTHER): Payer: Medicare Other | Admitting: Neurology

## 2013-11-15 VITALS — BP 106/63 | HR 78 | Ht 67.0 in | Wt 192.0 lb

## 2013-11-15 DIAGNOSIS — R413 Other amnesia: Secondary | ICD-10-CM | POA: Insufficient documentation

## 2013-11-15 DIAGNOSIS — H532 Diplopia: Secondary | ICD-10-CM | POA: Insufficient documentation

## 2013-11-15 HISTORY — DX: Diplopia: H53.2

## 2013-11-15 HISTORY — DX: Other amnesia: R41.3

## 2013-11-15 NOTE — Patient Instructions (Signed)
Diplopia  Double vision (diplopia) means that you are seeing two of everything. Diplopia usually occurs with both eyes open, and may be worse when looking in one particular direction. If both eyes must be open to see double, this is called binocular diplopia. If double images are seen in just one eye, this is called monocular diplopia.  CAUSES  Binocular Diplopia  Disorder affecting the muscles that move the eyes or the nerves that control those muscles.  Tumor or other mass pushing on an eye from beside or behind the eye.  Myasthenia gravis. This is a neuromuscular illness that causes the body's muscles to tire easily. The eye muscles and eyelid muscles become weak. The eyes do not track together well.  Grave's disease. This is an overactivity of the thyroid gland. This condition causes swelling of tissues around the eyes. This produces a bulging out of the eyeball.  Blowout fracture of the bone around the eye. The muscles of the eye socket are damaged. This often happens when the eye is hit with force.  Complications after certain surgeries, such as a lens implant after cataract surgery.  Fluid-filled mass (abscess) behind or beside the eye, infection, or abnormal connection between arteries and veins. Sometimes, no cause is found. Monocular Diplopia  Problems with corrective lens or contacts.  Corneal abrasion, infection, severe injury, or bulging and irregularity of the corneal surface (keratoconus).  Irregularities of the pupil from drugs, severe injury, or other causes.  Problems involving the lens of the eye, such as opacities or cataracts.  Complications after certain surgeries, such as a lens implant after cataract surgery.  Retinal detachment or problems involving the blood vessels of the retina. Sometimes, no cause is found. SYMPTOMS  Binocular Diplopia  When one eye is closed or covered, the double images disappear. Monocular Diplopia  When the unaffected eye is  closed or covered, the double images remain. The double images disappear when the affected eye is closed or covered. DIAGNOSIS  A diagnosis is made during an eye exam. TREATMENT  Treatment depends on the cause or underlying disease.   Relief of double vision symptoms may be achieved by patching one eye or by using special glasses.  Surgery on the muscles of the eye may be needed. SEEK IMMEDIATE MEDICAL CARE IF:   You see two images of a single object you are looking at, either with both eyes open or with just one eye open. Document Released: 06/02/2004 Document Revised: 10/24/2011 Document Reviewed: 03/19/2008 [employer] Patient Information 2014 [employer].  

## 2013-11-15 NOTE — Progress Notes (Signed)
Reason for visit: Memory disturbance  [name] is a 53 y.o. female  History of present illness:  Ms. Daffin is a 53 year old right-handed white female with a history of chronic low back pain, and irritable bowel syndrome. The patient also has a history of frequent headaches, with daily headaches that are throughout the head, and have been associated with neck pain and neck stiffness. The patient reports a problem that has been going on for 2 years associated with her memory. The patient has some difficulty with short-term memory, and she needs assistance with keeping up with medications and appointments. The patient operates a motor vehicle in a very limited capacity, and so far she has not had problems with getting lost. The patient has had some sensation of weakness of the legs associated with her back issues, and she has some balance problems, she uses a cane for ambulation. The patient has had a decline in cognitive function over the last 2 years. The patient also reports some issues with double vision that has been present off and on for one year. The double vision is horizontal in nature, and will come and go. The patient will blink her eyes, and the double vision disappears. The patient has had no progression of the double vision. No other associated symptoms of speech changes, problems swallowing, or loss of vision have been noted. The patient has been a lot of medications for her chronic pain issues, and she is on daily opiate medications, diazepam, and Effexor. The patient has had a CT scan of the brain done in November 2014 that was unremarkable. The patient is sent to this office for further evaluation. The patient does report some issues with balance.  Past Medical History  Diagnosis Date  . Allergic rhinitis   . Asthma   . GERD (gastroesophageal reflux disease)   . Depression   . Anxiety   . Chronic back pain   . IBS (irritable bowel syndrome)   . Hypertension   . Sleep  apnea   . Collagenous colitis     followed by Dr Tiffany Kocher  . Bronchitis, chronic   . Urine incontinence   . Migraine headache   . Ulcer disease   . Fibrocystic breast disease   . History of colon polyps   . Memory difficulty 11/15/2013  . Diplopia 11/15/2013    Past Surgical History  Procedure Laterality Date  . Nasal septoplasty w/ turbinoplasty    . Lumbar fusion  4/05 and 8/07  . Breast biopsy    . Abdominal hysterectomy      endometriosis  . Lumbar disc surgery    . Septoplasty      with reduction of turbinates  . Lumbar microdiscectomy  03/28/03    L4-5 L5  . Insertion of trial spinal cord stimulator    . Insertion of permanent spinal cord stimulator    . Removal of spinal cord stimulator    . Dilation and curettage of uterus      and hysteroscopy    Family History  Problem Relation Age of Onset  . Allergies Mother   . Hypertension Mother   . Arthritis Mother   . Hyperlipidemia Mother   . Stroke Mother   . Asthma      several family member  . Leukemia      uncle  . Hypertension Father   . Arthritis Father   . Diabetes Father   . Hypertension Brother   . Diabetes Brother   .  Arthritis Maternal Grandmother   . Hyperlipidemia Maternal Grandmother   . Hyperlipidemia Maternal Grandfather   . Arthritis Paternal Grandmother   . Arthritis Paternal Grandfather   . Lymphoma Maternal Aunt     non hodgkins    Social history:  reports that she has never smoked. She has never used smokeless tobacco. She reports that she does not drink alcohol or use illicit drugs.  Medications:  Current Outpatient Prescriptions on File Prior to Visit  Medication Sig Dispense Refill  . aspirin 81 MG tablet Take 81 mg by mouth daily.        . bisacodyl (DULCOLAX) 5 MG EC tablet Take 5 mg by mouth daily as needed for constipation.      . diazepam (VALIUM) 5 MG tablet Take 5 mg by mouth 3 (three) times daily as needed.        Marland Kitchen estrogen-methylTESTOSTERone (ESTRATEST) 1.25-2.5 MG per  tablet TAKE 1 TABLET BY MOUTH DAILY  30 tablet  2  . metoprolol (LOPRESSOR) 50 MG tablet TAKE 1/2 TABLET BY MOUTH TWICE A DAY  30 tablet  5  . Multiple Vitamin (MULTIVITAMIN) capsule Take 1 capsule by mouth daily.        Marland Kitchen NEXIUM 40 MG capsule TAKE 1 CAPSULE EVERY DAY BEFORE BREAKFAST  30 capsule  5  . omeprazole (PRILOSEC) 20 MG capsule Take 20 mg by mouth 2 (two) times daily.        Marland Kitchen OVER THE COUNTER MEDICATION i-cool (for hot flashes)      . oxyCODONE-acetaminophen (TYLOX) 5-500 MG per capsule Take 1 capsule by mouth 2 (two) times daily as needed.        . pravastatin (PRAVACHOL) 10 MG tablet TAKE 1 TABLET BY MOUTH ONCE A DAY  30 tablet  0  . venlafaxine (EFFEXOR) 37.5 MG tablet Take 37.5 mg by mouth daily.        . VENTOLIN HFA 108 (90 BASE) MCG/ACT inhaler INHALE 2 PUFFS INTO THE LUNGS EVERY 6 HOURS AS NEEDED.  18 each  1  . zolpidem (AMBIEN) 10 MG tablet Take 10 mg by mouth at bedtime as needed.        . budesonide-formoterol (SYMBICORT) 160-4.5 MCG/ACT inhaler Inhale 2 puffs into the lungs 2 (two) times daily.         No current facility-administered medications on file prior to visit.      Allergies  Allergen Reactions  . Buspirone Hcl   . Conjugated Estrogens   . Cymbalta [Duloxetine Hcl] Other (See Comments)    Headache  . Doxycycline     REACTION: causes severe abd pain  . Erythromycin Ethylsuccinate   . Estradiol   . Fentanyl   . Metronidazole   . Mirtazapine   . Montelukast Sodium   . Oxymorphone     REACTION: severe headaches  . Sumatriptan     ROS:  Out of a complete 14 system review of symptoms, the patient complains only of the following symptoms, and all other reviewed systems are negative.  Appetite change, neck pain Neck stiffness, hearing loss, ringing in the ears, difficulty swallowing Double vision, shortness of breath Cold intolerance, excessive thirst Abdominal pain, nausea Leg swelling Restless legs, insomnia Difficulty urinating, frequency of  urination Joint pain, joint swelling, back pain, achy muscles, muscle cramps, walking difficulties Dizziness Weakness  Blood pressure 106/63, pulse 78, height 5\' 7"  (1.702 m), weight 192 lb (87.091 kg).  Physical Exam  General: The patient is alert and cooperative at the time  of the examination.  Eyes: Pupils are equal, round, and reactive to light. Discs are flat bilaterally.  Neck: The neck is supple, no carotid bruits are noted.  Respiratory: The respiratory examination is clear.  Cardiovascular: The cardiovascular examination reveals a regular rate and rhythm, no obvious murmurs or rubs are noted.  Skin: Extremities are without significant edema.  Neurologic Exam  Mental status: The Mini-Mental status examination done today shows a total score 29/30.  Cranial nerves: Facial symmetry is present. There is good sensation of the face to pinprick and soft touch bilaterally. The strength of the facial muscles and the muscles to head turning and shoulder shrug are normal bilaterally. Speech is well enunciated, no aphasia or dysarthria is noted. Extraocular movements are full. Visual fields are full. The tongue is midline, and the patient has symmetric elevation of the soft palate. No obvious hearing deficits are noted. With superior gaze for 1 minute, there may be a mild divergent gaze with the right eye. The patient does report some double vision at 30 seconds. No ptosis is seen.  Motor: The motor testing reveals 5 over 5 strength of all 4 extremities. Good symmetric motor tone is noted throughout. With the arms outstretched 1 minute, no fatigable weakness of the deltoid muscles is seen.  Sensory: Sensory testing is intact to pinprick, soft touch, vibration sensation, and position sense on all 4 extremities. No evidence of extinction is noted.  Coordination: Cerebellar testing reveals good finger-nose-finger and heel-to-shin bilaterally.  Gait and station: Gait is associated with a  limping type gait on the right leg. The patient leans the right with walking. The patient uses a cane for ambulation. The patient has slight unsteadiness with tandem gait. Romberg is negative. No drift is seen.  Reflexes: Deep tendon reflexes are symmetric and normal bilaterally. Toes are downgoing bilaterally.   Assessment/Plan:  1. Memory disturbance  2. 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 Intermittent double vision  4. Chronic low back pain  5. Gait disorder  The patient is reporting some issues with memory and intermittent double vision. The patient is on a lot of medications that may affect balance, memory, and the patient could potentially have medications that may induce a latent strabismus. The patient will be set up for further blood work to look for etiologies of memory problems and double vision. The patient will be sent for formal neuropsychological testing. The patient has some underlying issues of depression as well. The patient will followup in 6 months, and the memory issues will be followed over time.  Jill Alexanders MD 11/15/2013 7:44 PM  [facility] [address] [place], [place] [address]  Phone [phone] Fax [phone]

## 2013-11-21 LAB — SEDIMENTATION RATE: Sed Rate: 5 mm/hr (ref 0–40)

## 2013-11-21 LAB — RHEUMATOID FACTOR: Rhuematoid fact SerPl-aCnc: <7 IU/mL (ref 0.0–13.9)

## 2013-11-21 LAB — ANGIOTENSIN CONVERTING ENZYME: Angio Convert Enzyme: 33 U/L (ref 14–82)

## 2013-11-21 LAB — PARANEOPLASTIC PROFILE 1
Neuronal Nuc Ab (Ri), IFA: <1:10 {titer}
Neuronal Nuclear (Hu) Antibody (IB): <1:10 {titer}
Purkinje Cell (Yo) Autoantobodies- IFA: <1:10 {titer}

## 2013-11-21 LAB — HIV ANTIBODY (ROUTINE TESTING W REFLEX)
HIV 1/O/2 Abs-Index Value: <1 (ref <1.00)
HIV-1/HIV-2 Ab: NONREACTIVE

## 2013-11-21 LAB — RPR: RPR: NONREACTIVE

## 2013-11-21 LAB — CK: Total CK: 69 U/L (ref 24–173)

## 2013-11-21 LAB — ACETYLCHOLINE RECEPTOR, BINDING: AChR Binding Ab, Serum: <0.03 nmol/L (ref 0.00–0.24)

## 2013-11-21 LAB — ANA W/REFLEX: Anti Nuclear Antibody(ANA): NEGATIVE

## 2013-11-21 NOTE — Progress Notes (Signed)
Quick Note:  I gave results of labs to pt. She has neuropysch eval ordered. Awaiting authorization then they will schedule. ______

## 2013-12-02 ENCOUNTER — Other Ambulatory Visit: Payer: Self-pay | Admitting: Internal Medicine

## 2013-12-05 ENCOUNTER — Other Ambulatory Visit: Payer: Self-pay | Admitting: *Deleted

## 2013-12-05 MED ORDER — PRAVASTATIN SODIUM 10 MG PO TABS: ORAL_TABLET | ORAL | Status: DC

## 2013-12-05 NOTE — Telephone Encounter (Signed)
Mailed unread message to pt  

## 2014-01-01 ENCOUNTER — Other Ambulatory Visit: Payer: Self-pay | Admitting: Internal Medicine

## 2014-01-01 NOTE — Telephone Encounter (Signed)
Sent mychart message with need for appt and scheduled appt

## 2014-01-01 NOTE — Telephone Encounter (Signed)
She needs a f/u appt.  Needs to be 30 min.  I will refill until appt.  Thanks.

## 2014-01-01 NOTE — Telephone Encounter (Signed)
Last visit 10/14, refill?

## 2014-01-03 NOTE — Telephone Encounter (Signed)
Mailed unread message to pt  

## 2014-01-04 ENCOUNTER — Other Ambulatory Visit: Payer: Self-pay | Admitting: Internal Medicine

## 2014-01-10 MED ORDER — EST ESTROGENS-METHYLTEST 1.25-2.5 MG PO TABS: ORAL_TABLET | ORAL | Status: DC

## 2014-01-10 NOTE — Telephone Encounter (Signed)
Rx faxed to pharmacy  

## 2014-01-10 NOTE — Telephone Encounter (Signed)
Pt aware of appt scheduled July 1. Also responded to pt via mychart, see other encounter. Ok refill until July

## 2014-01-14 ENCOUNTER — Ambulatory Visit
Admission: RE | Admit: 2014-01-14 | Discharge: 2014-01-14 | Disposition: A | Discharge status: Home / Self Care | Payer: PRIVATE HEALTH INSURANCE | Source: Ambulatory Visit | Attending: Physical Medicine and Rehabilitation | Admitting: Physical Medicine and Rehabilitation

## 2014-01-14 ENCOUNTER — Other Ambulatory Visit: Payer: Self-pay | Admitting: Physical Medicine and Rehabilitation

## 2014-01-14 DIAGNOSIS — R52 Pain, unspecified: Secondary | ICD-10-CM

## 2014-01-14 DIAGNOSIS — R609 Edema, unspecified: Secondary | ICD-10-CM

## 2014-02-07 ENCOUNTER — Other Ambulatory Visit: Payer: Self-pay | Admitting: Internal Medicine

## 2014-02-10 ENCOUNTER — Other Ambulatory Visit: Payer: Self-pay | Admitting: Internal Medicine

## 2014-02-10 NOTE — Telephone Encounter (Signed)
appt 02/12/14

## 2014-02-12 ENCOUNTER — Encounter: Payer: Self-pay | Admitting: Internal Medicine

## 2014-02-12 ENCOUNTER — Ambulatory Visit (INDEPENDENT_AMBULATORY_CARE_PROVIDER_SITE_OTHER): Payer: Medicare Other | Admitting: Internal Medicine

## 2014-02-12 VITALS — BP 110/70 | HR 67 | Temp 97.6°F | Ht 67.0 in | Wt 192.2 lb

## 2014-02-12 DIAGNOSIS — J45909 Unspecified asthma, uncomplicated: Secondary | ICD-10-CM

## 2014-02-12 DIAGNOSIS — F32A Depression, unspecified: Secondary | ICD-10-CM

## 2014-02-12 DIAGNOSIS — J329 Chronic sinusitis, unspecified: Secondary | ICD-10-CM

## 2014-02-12 DIAGNOSIS — K52831 Collagenous colitis: Secondary | ICD-10-CM

## 2014-02-12 DIAGNOSIS — I10 Essential (primary) hypertension: Secondary | ICD-10-CM

## 2014-02-12 DIAGNOSIS — D721 Eosinophilia, unspecified: Secondary | ICD-10-CM

## 2014-02-12 DIAGNOSIS — K219 Gastro-esophageal reflux disease without esophagitis: Secondary | ICD-10-CM

## 2014-02-12 DIAGNOSIS — F329 Major depressive disorder, single episode, unspecified: Secondary | ICD-10-CM

## 2014-02-12 DIAGNOSIS — N644 Mastodynia: Secondary | ICD-10-CM

## 2014-02-12 DIAGNOSIS — M3390 Dermatopolymyositis, unspecified, organ involvement unspecified: Secondary | ICD-10-CM

## 2014-02-12 DIAGNOSIS — M549 Dorsalgia, unspecified: Secondary | ICD-10-CM

## 2014-02-12 DIAGNOSIS — E7800 Pure hypercholesterolemia, unspecified: Secondary | ICD-10-CM

## 2014-02-12 DIAGNOSIS — R413 Other amnesia: Secondary | ICD-10-CM

## 2014-02-12 DIAGNOSIS — F3289 Other specified depressive episodes: Secondary | ICD-10-CM

## 2014-02-12 DIAGNOSIS — G4733 Obstructive sleep apnea (adult) (pediatric): Secondary | ICD-10-CM

## 2014-02-12 DIAGNOSIS — K5289 Other specified noninfective gastroenteritis and colitis: Secondary | ICD-10-CM

## 2014-02-12 MED ORDER — AMOXICILLIN-POT CLAVULANATE 875-125 MG PO TABS: 1.0000 | ORAL_TABLET | Freq: Two times a day (BID) | ORAL | Status: DC

## 2014-02-12 NOTE — Progress Notes (Signed)
Pre visit review using our clinic review tool, if applicable. No additional management support is needed unless otherwise documented below in the visit note. 

## 2014-02-12 NOTE — Patient Instructions (Signed)
Saline nasal spray - flush nose at least 2-3x/day  nasacort nasal spray - 2 sprays each nostril one time per day.  Do this in the evening.    Use your flovent inhaler regularly.

## 2014-02-15 ENCOUNTER — Encounter: Payer: Self-pay | Admitting: Internal Medicine

## 2014-02-15 DIAGNOSIS — N644 Mastodynia: Secondary | ICD-10-CM | POA: Insufficient documentation

## 2014-02-15 NOTE — Assessment & Plan Note (Signed)
Not taking her steroid inhaler regularly.  Instructed on the importance of taking regularly.  Treat sinus infection.  Follow.

## 2014-02-15 NOTE — Assessment & Plan Note (Signed)
Colonoscopy 5/08 normal except internal hemorrhoids.  Overdue colonoscopy.  appt with GI end of the month.

## 2014-02-15 NOTE — Assessment & Plan Note (Signed)
Continue on pravastatin.  Low cholesterol diet.  Check lipid panel and liver function.

## 2014-02-15 NOTE — Assessment & Plan Note (Signed)
Saw Dr Jannifer Franklin.  Refer to his note for details.  Continue to f/u with neurology.

## 2014-02-15 NOTE — Assessment & Plan Note (Signed)
Stable on current regimen.  Follow.  Have discussed with her the need for psych referral.

## 2014-02-15 NOTE — Assessment & Plan Note (Signed)
Blood pressure has been under good control.  On no medications now.  Check metabolic panel.  Follow.  Stay hydrated.

## 2014-02-15 NOTE — Assessment & Plan Note (Signed)
Chronic.  Followed by pain clinic.  On multiple meds.  Has decreased dose of a couple of her medications.  Follow.

## 2014-02-15 NOTE — Assessment & Plan Note (Signed)
Declines CPAP

## 2014-02-15 NOTE — Assessment & Plan Note (Signed)
Exam as outlined.  Schedule a diagnostic mammo right breast with possible ultrasound.  Further w/up pending.

## 2014-02-15 NOTE — Assessment & Plan Note (Signed)
Acute symptoms as outlined.  Treat with augmentin as outlined.  Saline nasal spray and steroid nasal spray as outlined.  mucinex as directed.  Follow.

## 2014-02-15 NOTE — Progress Notes (Signed)
Subjective:    Patient ID: [name], female    DOB: 02-21-1961, 53 y.o.   MRN: [id number]  HPI 53 year old female with past history of asthma, IBS, migraine headaches, depression and chronic back pain s/p multiple back surgeries.  She comes in today to follow up on these issues as well as for a complete physical exam.   She is accompanied by her husband.  History obtained from both of them.  She is followed at the pain clinic. Taking multiple pain meds. Has recently decreased some of her medications.  She has noticed some increased acid reflux.  See last note for details.  Has not seen GI yet.  Has not kept her appts or either rescheduled her appts.  She is scheduled to follow up with them soon.   Is planning for a follow up colonoscopy.  Is going to discuss with her regarding her upper symptoms (with the question of also doing an EGD with the next colonoscopy).   Has had  issues with constipation.  Feels this is better now.  She reports increased fatigue.  Increased joint and muscle aches.  Has been diagnosed with dermatomyositis.  She also reports some change in memory.  Some confusion intermittently.  Saw Dr Jannifer Franklin (neurology).  See his note for details.  Reports increased sinus congestion.  Blood tinged mucus from her nose.  Some cough and congestion.  Some wheezing.  On nexium.  Increased acid reflux as outlined.  Had previously noticed some increased pain right breast (under her nipple).  No nipple discharge.     Past Medical History  Diagnosis Date  . Allergic rhinitis   . Asthma   . GERD (gastroesophageal reflux disease)   . Depression   . Anxiety   . Chronic back pain   . IBS (irritable bowel syndrome)   . Hypertension   . Sleep apnea   . Collagenous colitis     followed by Dr Tiffany Kocher  . Bronchitis, chronic   . Urine incontinence   . Migraine headache   . Ulcer disease   . Fibrocystic breast disease   . History of colon polyps   . Memory difficulty 11/15/2013  . Diplopia  11/15/2013    Outpatient Encounter Prescriptions as of 02/12/2014  Medication Sig  . bisacodyl (DULCOLAX) 5 MG EC tablet Take 5 mg by mouth daily as needed for constipation.  . diazepam (VALIUM) 5 MG tablet Take 5 mg by mouth 3 (three) times daily as needed.    Marland Kitchen estrogen-methylTESTOSTERone (ESTRATEST) 1.25-2.5 MG per tablet TAKE 1 TABLET BY MOUTH DAILY  . fluticasone (FLOVENT HFA) 220 MCG/ACT inhaler Inhale 1 puff into the lungs 2 (two) times daily.  Marland Kitchen gabapentin (NEURONTIN) 600 MG tablet Take 600 mg by mouth 4 (four) times daily - after meals and at bedtime.  . metoprolol (LOPRESSOR) 50 MG tablet TAKE 1/2 TABLET BY MOUTH TWICE A DAY  . Multiple Vitamin (MULTIVITAMIN) capsule Take 1 capsule by mouth daily.    Marland Kitchen MYRBETRIQ 50 MG TB24 tablet Take 50 mg by mouth daily.  Marland Kitchen NEXIUM 40 MG capsule TAKE 1 CAPSULE EVERY DAY BEFORE BREAKFAST  . omeprazole (PRILOSEC OTC) 20 MG tablet Take 20 mg by mouth 2 (two) times daily.  Marland Kitchen OVER THE COUNTER MEDICATION i-cool (for hot flashes)  . oxyCODONE-acetaminophen (TYLOX) 5-500 MG per capsule Take 1 capsule by mouth every 8 (eight) hours as needed.   . OXYCONTIN 80 MG T12A 12 hr tablet Take 80 mg by mouth  every 12 (twelve) hours.   . pravastatin (PRAVACHOL) 10 MG tablet Take 1 tablet (10 mg total) by mouth daily.  . pravastatin (PRAVACHOL) 10 MG tablet TAKE 1 TABLET BY MOUTH ONCE A DAY**NEEDS OFFICE VISIT**  . topiramate (TOPAMAX) 50 MG tablet Take 50 mg by mouth daily.  Marland Kitchen venlafaxine (EFFEXOR) 37.5 MG tablet Take 37.5 mg by mouth daily.    . VENTOLIN HFA 108 (90 BASE) MCG/ACT inhaler INHALE 2 PUFFS INTO THE LUNGS EVERY 6 HOURS AS NEEDED.  Marland Kitchen zolpidem (AMBIEN) 10 MG tablet Take 10 mg by mouth at bedtime as needed.    Marland Kitchen amoxicillin-clavulanate (AUGMENTIN) 875-125 MG per tablet Take 1 tablet by mouth 2 (two) times daily.  . [DISCONTINUED] aspirin 81 MG tablet Take 81 mg by mouth daily.    . [DISCONTINUED] budesonide-formoterol (SYMBICORT) 160-4.5 MCG/ACT inhaler  Inhale 2 puffs into the lungs 2 (two) times daily.    . [DISCONTINUED] omeprazole (PRILOSEC) 20 MG capsule Take 20 mg by mouth 2 (two) times daily.      Review of Systems Has a history of migraine headaches.  Appears to be stable.  She does describe the trouble with her memory.  Seeing neurology.  Increased sinus congestion and chest congestion as outlined.   No chest pain, tightness or palpitations.  Has noticed some congestion and wheezing lately.   No nausea or vomiting.  Acid reflux symptoms as outlined.  Some dysphagia.  No abdominal pain or cramping.  No BRBPR or melana.   Still having issues with her bladder.  Urinary hesitancy.  Has seen urology.  Has a f/u with Dr cope soon.  Planning to see GI soon.        Objective:   Physical Exam  Filed Vitals:   02/12/14 1155  BP: 110/70  Pulse: 67  Temp: 97.6 F (33.70 C)   53 year old female in no acute distress.   HEENT:  Nares- clear.  Oropharynx - without lesions. NECK:  Supple.  Nontender.  No audible bruit.  HEART:  Appears to be regular. LUNGS:  No crackles or wheezing audible.  Respirations even and unlabored.  RADIAL PULSE:  Equal bilaterally.    BREASTS:  No nipple discharge or nipple retraction present.  Could not appreciate any distinct nodules or axillary adenopathy.  No tenderness.   ABDOMEN:  Soft, nontender.  Bowel sounds present and normal.  No audible abdominal bruit.  GU:  Not performed.     EXTREMITIES:  No increased edema present.  DP pulses palpable and equal bilaterally.          Assessment & Plan:  CARDIOVASCULAR.  Heart catheterization 6/10 revealed normal coronaries.  Currently asymptomatic.   FATIGUE.  Probably multifactorial.  On multiple sedating meds.  Has known sleep apnea and declines CPAP.  Does not exercise.  Check cbc, met c and tsh.   HEALTH MAINTENANCE.  Physical today.   Mammogram 05/27/13 - Bir[facility] I.  Will schedule diagnostic right mammogram.  Colonoscopy 5/08 normal except for internal  hemorrhoids.  Has appt with GI as outlined.    I spent over 40 minutes with this patient and more than 50% of the time was spent in consultation regarding the above.

## 2014-02-15 NOTE — Assessment & Plan Note (Signed)
Increased joint and muscle pains.  Followed by Dr Jefm Bryant.

## 2014-02-15 NOTE — Assessment & Plan Note (Signed)
Reflux symptoms and dysphagia as outlined.  Plans to see GI end of the month.  Increase nexium to bid.  Needs EGD.

## 2014-02-19 ENCOUNTER — Other Ambulatory Visit: Payer: Medicare Other

## 2014-02-24 ENCOUNTER — Telehealth: Payer: Self-pay | Admitting: Internal Medicine

## 2014-02-24 NOTE — Telephone Encounter (Signed)
See below Re: Estrogen management.  Okay to refill inhaler?

## 2014-02-24 NOTE — Telephone Encounter (Signed)
Patient needs her inhaler called into [facility] on [address]. She also states that Dr. Nicki Reaper wanted her to try coming off of her estrogen, she states that she has stopped taken this medication. She isn't sure if that was the right thing to do or not since she wasn't told by our office if she should just stop all together or to wing off of the medication. Does she need to start taken it and wing off of the medication? Please advise. She was also supposed to have a diagnostic mammogram of right breast due to having pain.

## 2014-02-25 ENCOUNTER — Other Ambulatory Visit: Payer: Self-pay | Admitting: *Deleted

## 2014-02-25 MED ORDER — ALBUTEROL SULFATE HFA 108 (90 BASE) MCG/ACT IN AERS: INHALATION_SPRAY | RESPIRATORY_TRACT | Status: DC

## 2014-02-25 NOTE — Telephone Encounter (Signed)
Ok to refill her rescue inhaler (albuterol) x 3.  Her diagnostic mammo has been ordered.  After pt notified of recommendations for tapering her estratest, then please let Hoyle Sauer know pt questioning when mammo to be performed.  Regarding the estra test, have her take the estratest five days per week and off two days per week (i.e., don't take on Monday and Thursday and take all other days).  If does ok with this dose after several weeks, would then drop down another day and then gradually taper off.

## 2014-02-25 NOTE — Telephone Encounter (Signed)
Returned call to pt.  No answer, did not leave message as VM not identified.

## 2014-02-26 ENCOUNTER — Other Ambulatory Visit: Payer: Self-pay | Admitting: *Deleted

## 2014-02-26 MED ORDER — ALBUTEROL SULFATE HFA 108 (90 BASE) MCG/ACT IN AERS: INHALATION_SPRAY | RESPIRATORY_TRACT | Status: DC

## 2014-02-26 NOTE — Telephone Encounter (Signed)
Left message to call back for tapering instructions. Albuterol Rx sent to pharmacy

## 2014-02-27 NOTE — Telephone Encounter (Signed)
Husband notified of instructions

## 2014-03-06 ENCOUNTER — Telehealth: Payer: Self-pay | Admitting: *Deleted

## 2014-03-06 MED ORDER — FLUTICASONE PROPIONATE HFA 220 MCG/ACT IN AERO: 1.0000 | INHALATION_SPRAY | Freq: Two times a day (BID) | RESPIRATORY_TRACT | Status: DC

## 2014-03-06 NOTE — Telephone Encounter (Signed)
rx sent in for flovent.  I am forwarding to you for mammogram.  Thanks.

## 2014-03-06 NOTE — Telephone Encounter (Signed)
Pt called to follow up on her diagnostic mammogram of her right breast that was placed on 02/12/14.

## 2014-03-06 NOTE — Telephone Encounter (Signed)
Pt called requesting an Rx for fluticasone (FLOVENT HFA) 220 MCG/ACT inhaler.  It is listed as a historical med, pt states that you and her talked about filling this at her last appt.  Okay to fill?  Pt also states that she has not received a call with her diagnostic mammogram for her right breast, which was ordered on 02/12/14.

## 2014-03-18 ENCOUNTER — Telehealth: Payer: Self-pay | Admitting: *Deleted

## 2014-03-18 MED ORDER — MOMETASONE FUROATE 220 MCG/INH IN AEPB: 2.0000 | INHALATION_SPRAY | Freq: Every day | RESPIRATORY_TRACT | Status: DC

## 2014-03-18 NOTE — Telephone Encounter (Signed)
[facility], Flovent not covered by insurance, they will cover Asmanex or Qvar. Dr. Nicki Reaper responded if ok with pt, may change to Asmanex. Mychart sent to pt to verify change is ok. [facility] is needing to know Asmanex 100 mg or 200 mg and directions.

## 2014-03-18 NOTE — Telephone Encounter (Signed)
Sent in rx for asmanex 242mcg to pharmacy.

## 2014-03-19 ENCOUNTER — Other Ambulatory Visit: Payer: Self-pay | Admitting: Internal Medicine

## 2014-03-21 ENCOUNTER — Ambulatory Visit: Payer: Self-pay | Admitting: Internal Medicine

## 2014-03-21 LAB — HM MAMMOGRAPHY

## 2014-03-24 ENCOUNTER — Telehealth: Payer: Self-pay | Admitting: Internal Medicine

## 2014-03-24 ENCOUNTER — Encounter: Payer: Self-pay | Admitting: Internal Medicine

## 2014-03-24 NOTE — Telephone Encounter (Signed)
Pt notified of mammogram and ultrasound results via my chart.  To let me know if persistent pain.  Can refer to surgery.

## 2014-03-26 NOTE — Telephone Encounter (Signed)
Unread mychart message mailed to patient 

## 2014-03-27 ENCOUNTER — Other Ambulatory Visit: Payer: Self-pay | Admitting: Internal Medicine

## 2014-03-28 NOTE — Telephone Encounter (Signed)
I thought she was tapering this medication.  If so, need to know how she is taking.

## 2014-03-28 NOTE — Telephone Encounter (Signed)
Last refill 6.29.15, last OV 7.1.15, next OV 11.2.15.  Please advise refill.

## 2014-04-01 ENCOUNTER — Telehealth: Payer: Self-pay | Admitting: Neurology

## 2014-04-01 ENCOUNTER — Other Ambulatory Visit: Payer: Self-pay | Admitting: *Deleted

## 2014-04-01 NOTE — Telephone Encounter (Signed)
error 

## 2014-04-01 NOTE — Telephone Encounter (Signed)
This patient decided not to have the neuropsychological evaluation performed. She indicated that she has had improvement in her memory following some medication adjustments.

## 2014-04-04 NOTE — Telephone Encounter (Signed)
Pt states that she did taper it, & she has stopped it now. She is doing okay but still having hot flashes. She has started back on icool last night.

## 2014-04-19 ENCOUNTER — Other Ambulatory Visit: Payer: Self-pay | Admitting: Internal Medicine

## 2014-05-21 ENCOUNTER — Ambulatory Visit: Payer: Medicare Other | Admitting: Nurse Practitioner

## 2014-05-25 ENCOUNTER — Other Ambulatory Visit: Payer: Self-pay | Admitting: Internal Medicine

## 2014-05-27 ENCOUNTER — Other Ambulatory Visit: Payer: Self-pay | Admitting: Internal Medicine

## 2014-06-06 ENCOUNTER — Telehealth: Payer: Self-pay | Admitting: Internal Medicine

## 2014-06-06 NOTE — Telephone Encounter (Signed)
noted 

## 2014-06-06 NOTE — Telephone Encounter (Signed)
Pt. Last ordered vac.12/21/10.

## 2014-06-06 NOTE — Telephone Encounter (Signed)
Consider her quit allergy vaccine

## 2014-06-13 ENCOUNTER — Telehealth: Payer: Self-pay | Admitting: Internal Medicine

## 2014-06-13 NOTE — Telephone Encounter (Signed)
[name] called saying she felt she needed to cancel her 11/2 appt due to not having had an appt with the GI doctor or [name] as she was supposed to before the follow-up appt. She said she has an appt with the GI Doctor next week but when she called [name], she was told they didn't have any available appts and they'd call her back. When [name] has had the appts, she'll call back to reschedule. Please call the patient if needed. Pt ph# [phone] Thank you.

## 2014-06-16 ENCOUNTER — Ambulatory Visit: Payer: Medicare Other | Admitting: Internal Medicine

## 2014-06-16 NOTE — Telephone Encounter (Signed)
See below

## 2014-06-16 NOTE — Telephone Encounter (Signed)
Noted  

## 2014-06-19 ENCOUNTER — Encounter: Payer: Self-pay | Admitting: Internal Medicine

## 2014-06-22 ENCOUNTER — Other Ambulatory Visit: Payer: Self-pay | Admitting: Internal Medicine

## 2014-08-06 ENCOUNTER — Other Ambulatory Visit: Payer: Self-pay | Admitting: Physical Medicine and Rehabilitation

## 2014-08-06 DIAGNOSIS — M545 Low back pain: Secondary | ICD-10-CM

## 2014-08-08 ENCOUNTER — Other Ambulatory Visit: Payer: Self-pay | Admitting: Internal Medicine

## 2014-08-18 ENCOUNTER — Other Ambulatory Visit: Payer: Medicare Other

## 2014-08-19 ENCOUNTER — Other Ambulatory Visit: Payer: Medicare Other

## 2014-08-26 ENCOUNTER — Ambulatory Visit
Admission: RE | Admit: 2014-08-26 | Discharge: 2014-08-26 | Disposition: A | Discharge status: Home / Self Care | Payer: Medicare Other | Source: Ambulatory Visit | Attending: Physical Medicine and Rehabilitation | Admitting: Physical Medicine and Rehabilitation

## 2014-08-26 DIAGNOSIS — M5126 Other intervertebral disc displacement, lumbar region: Secondary | ICD-10-CM | POA: Diagnosis not present

## 2014-08-26 DIAGNOSIS — M545 Low back pain: Secondary | ICD-10-CM

## 2014-08-26 DIAGNOSIS — M47816 Spondylosis without myelopathy or radiculopathy, lumbar region: Secondary | ICD-10-CM | POA: Diagnosis not present

## 2014-08-26 DIAGNOSIS — M4806 Spinal stenosis, lumbar region: Secondary | ICD-10-CM | POA: Diagnosis not present

## 2014-09-02 DIAGNOSIS — M47817 Spondylosis without myelopathy or radiculopathy, lumbosacral region: Secondary | ICD-10-CM | POA: Diagnosis not present

## 2014-09-02 DIAGNOSIS — M62830 Muscle spasm of back: Secondary | ICD-10-CM | POA: Diagnosis not present

## 2014-09-02 DIAGNOSIS — Z79891 Long term (current) use of opiate analgesic: Secondary | ICD-10-CM | POA: Diagnosis not present

## 2014-09-02 DIAGNOSIS — G894 Chronic pain syndrome: Secondary | ICD-10-CM | POA: Diagnosis not present

## 2014-09-02 DIAGNOSIS — G4700 Insomnia, unspecified: Secondary | ICD-10-CM | POA: Diagnosis not present

## 2014-09-03 ENCOUNTER — Other Ambulatory Visit: Payer: Self-pay | Admitting: Internal Medicine

## 2014-09-03 DIAGNOSIS — N320 Bladder-neck obstruction: Secondary | ICD-10-CM | POA: Diagnosis not present

## 2014-09-03 DIAGNOSIS — R3916 Straining to void: Secondary | ICD-10-CM | POA: Diagnosis not present

## 2014-09-03 DIAGNOSIS — N3281 Overactive bladder: Secondary | ICD-10-CM | POA: Diagnosis not present

## 2014-09-03 DIAGNOSIS — R3914 Feeling of incomplete bladder emptying: Secondary | ICD-10-CM | POA: Diagnosis not present

## 2014-09-03 DIAGNOSIS — R339 Retention of urine, unspecified: Secondary | ICD-10-CM | POA: Diagnosis not present

## 2014-09-04 ENCOUNTER — Encounter: Payer: Self-pay | Admitting: *Deleted

## 2014-09-04 DIAGNOSIS — N320 Bladder-neck obstruction: Secondary | ICD-10-CM | POA: Insufficient documentation

## 2014-09-04 DIAGNOSIS — N393 Stress incontinence (female) (male): Secondary | ICD-10-CM | POA: Insufficient documentation

## 2014-09-08 NOTE — Telephone Encounter (Signed)
Mailed unread message to pt  

## 2014-09-09 ENCOUNTER — Encounter: Payer: Self-pay | Admitting: Internal Medicine

## 2014-09-28 ENCOUNTER — Other Ambulatory Visit: Payer: Self-pay | Admitting: Internal Medicine

## 2014-10-01 DIAGNOSIS — G894 Chronic pain syndrome: Secondary | ICD-10-CM | POA: Diagnosis not present

## 2014-10-01 DIAGNOSIS — G4700 Insomnia, unspecified: Secondary | ICD-10-CM | POA: Diagnosis not present

## 2014-10-01 DIAGNOSIS — M47817 Spondylosis without myelopathy or radiculopathy, lumbosacral region: Secondary | ICD-10-CM | POA: Diagnosis not present

## 2014-10-01 DIAGNOSIS — M62830 Muscle spasm of back: Secondary | ICD-10-CM | POA: Diagnosis not present

## 2014-10-10 ENCOUNTER — Encounter: Payer: Self-pay | Admitting: *Deleted

## 2014-10-30 DIAGNOSIS — M47816 Spondylosis without myelopathy or radiculopathy, lumbar region: Secondary | ICD-10-CM | POA: Diagnosis not present

## 2014-11-27 DIAGNOSIS — M62830 Muscle spasm of back: Secondary | ICD-10-CM | POA: Diagnosis not present

## 2014-11-27 DIAGNOSIS — G4700 Insomnia, unspecified: Secondary | ICD-10-CM | POA: Diagnosis not present

## 2014-11-27 DIAGNOSIS — G894 Chronic pain syndrome: Secondary | ICD-10-CM | POA: Diagnosis not present

## 2014-11-27 DIAGNOSIS — M47817 Spondylosis without myelopathy or radiculopathy, lumbosacral region: Secondary | ICD-10-CM | POA: Diagnosis not present

## 2014-12-11 ENCOUNTER — Other Ambulatory Visit: Payer: Self-pay | Admitting: Internal Medicine

## 2014-12-22 ENCOUNTER — Other Ambulatory Visit: Payer: Self-pay | Admitting: Internal Medicine

## 2014-12-25 DIAGNOSIS — H43393 Other vitreous opacities, bilateral: Secondary | ICD-10-CM | POA: Diagnosis not present

## 2014-12-25 DIAGNOSIS — H2513 Age-related nuclear cataract, bilateral: Secondary | ICD-10-CM | POA: Diagnosis not present

## 2014-12-25 DIAGNOSIS — M62830 Muscle spasm of back: Secondary | ICD-10-CM | POA: Diagnosis not present

## 2014-12-25 DIAGNOSIS — G4700 Insomnia, unspecified: Secondary | ICD-10-CM | POA: Diagnosis not present

## 2014-12-25 DIAGNOSIS — G894 Chronic pain syndrome: Secondary | ICD-10-CM | POA: Diagnosis not present

## 2014-12-25 DIAGNOSIS — H04123 Dry eye syndrome of bilateral lacrimal glands: Secondary | ICD-10-CM | POA: Diagnosis not present

## 2014-12-25 DIAGNOSIS — M47817 Spondylosis without myelopathy or radiculopathy, lumbosacral region: Secondary | ICD-10-CM | POA: Diagnosis not present

## 2015-01-05 NOTE — Telephone Encounter (Signed)
Mailed unread message to pt  

## 2015-01-07 ENCOUNTER — Other Ambulatory Visit: Payer: Self-pay | Admitting: Internal Medicine

## 2015-01-07 ENCOUNTER — Telehealth: Payer: Self-pay | Admitting: Internal Medicine

## 2015-01-07 DIAGNOSIS — Z1239 Encounter for other screening for malignant neoplasm of breast: Secondary | ICD-10-CM

## 2015-01-07 NOTE — Telephone Encounter (Signed)
Spoke to pt, on need for appt. Scheduled 04/09/15. Rx sent to pharmacy by escript

## 2015-01-07 NOTE — Telephone Encounter (Signed)
The called wanting her mammogram scheduled. I'm needing an order for Bilateral screening.

## 2015-01-07 NOTE — Telephone Encounter (Signed)
Order placed for mammogram.

## 2015-01-07 NOTE — Telephone Encounter (Addendum)
Called pt on need for appt, no answer, VM not set up, unable to leave message

## 2015-01-10 ENCOUNTER — Other Ambulatory Visit: Payer: Self-pay | Admitting: Internal Medicine

## 2015-01-11 ENCOUNTER — Encounter: Payer: Self-pay | Admitting: *Deleted

## 2015-01-14 NOTE — Telephone Encounter (Signed)
Unread mychart message mailed to patient 

## 2015-01-14 NOTE — Telephone Encounter (Deleted)
Unread mychart message mailed to patient 

## 2015-01-22 DIAGNOSIS — M47817 Spondylosis without myelopathy or radiculopathy, lumbosacral region: Secondary | ICD-10-CM | POA: Diagnosis not present

## 2015-01-22 DIAGNOSIS — G894 Chronic pain syndrome: Secondary | ICD-10-CM | POA: Diagnosis not present

## 2015-01-22 DIAGNOSIS — M62830 Muscle spasm of back: Secondary | ICD-10-CM | POA: Diagnosis not present

## 2015-01-22 DIAGNOSIS — G4700 Insomnia, unspecified: Secondary | ICD-10-CM | POA: Diagnosis not present

## 2015-01-23 ENCOUNTER — Other Ambulatory Visit: Payer: Self-pay | Admitting: Internal Medicine

## 2015-01-23 NOTE — Telephone Encounter (Signed)
Spoke to pt, fasting lab appt scheduled 01/30/15

## 2015-01-29 ENCOUNTER — Ambulatory Visit
Admission: RE | Admit: 2015-01-29 | Discharge: 2015-01-29 | Disposition: A | Discharge status: Home / Self Care | Payer: Medicare Other | Source: Ambulatory Visit | Attending: Internal Medicine | Admitting: Internal Medicine

## 2015-01-29 DIAGNOSIS — Z1239 Encounter for other screening for malignant neoplasm of breast: Secondary | ICD-10-CM

## 2015-01-30 ENCOUNTER — Other Ambulatory Visit: Payer: Medicare Other

## 2015-02-02 ENCOUNTER — Ambulatory Visit: Admission: RE | Admit: 2015-02-02 | Payer: Medicare Other | Source: Ambulatory Visit

## 2015-02-04 ENCOUNTER — Ambulatory Visit: Admission: RE | Admit: 2015-02-04 | Payer: Medicare Other | Source: Ambulatory Visit

## 2015-02-10 DIAGNOSIS — N393 Stress incontinence (female) (male): Secondary | ICD-10-CM | POA: Diagnosis not present

## 2015-02-10 DIAGNOSIS — R339 Retention of urine, unspecified: Secondary | ICD-10-CM | POA: Diagnosis not present

## 2015-02-18 ENCOUNTER — Ambulatory Visit: Admission: RE | Admit: 2015-02-18 | Payer: Medicare Other | Source: Ambulatory Visit

## 2015-02-21 ENCOUNTER — Other Ambulatory Visit: Payer: Self-pay | Admitting: Internal Medicine

## 2015-02-23 NOTE — Telephone Encounter (Signed)
Needs labs before refill on medication.   Please schedule appt and I can order labs.  Thanks

## 2015-02-23 NOTE — Telephone Encounter (Signed)
Patient hasn't had labs since 10.14.14? Please advise for refill.

## 2015-02-25 DIAGNOSIS — M47816 Spondylosis without myelopathy or radiculopathy, lumbar region: Secondary | ICD-10-CM | POA: Diagnosis not present

## 2015-02-26 ENCOUNTER — Telehealth: Payer: Self-pay | Admitting: *Deleted

## 2015-02-26 NOTE — Telephone Encounter (Signed)
Spoke with pt, advised of MDs message.  Pt verbalized understanding 

## 2015-02-26 NOTE — Telephone Encounter (Signed)
Fax from pharmacy requesting Pravastatin 10 mg.  Last OV 7.1.15, next OV 8.25.16.  Pt has lab appoint scheduled 7.20.16 at 10:15am.  Please advise refill.

## 2015-02-26 NOTE — Telephone Encounter (Signed)
Per previous refill message on 02/21/15 - she needs labs before will call in rx.  She has not had labs since 2014.  Need to notify pt.

## 2015-02-27 ENCOUNTER — Encounter: Payer: Self-pay | Admitting: *Deleted

## 2015-02-27 ENCOUNTER — Other Ambulatory Visit: Payer: Self-pay | Admitting: *Deleted

## 2015-02-27 MED ORDER — PRAVASTATIN SODIUM 10 MG PO TABS: 10.0000 mg | ORAL_TABLET | Freq: Every day | ORAL | Status: DC

## 2015-03-02 ENCOUNTER — Other Ambulatory Visit: Payer: Self-pay | Admitting: Internal Medicine

## 2015-03-02 DIAGNOSIS — F329 Major depressive disorder, single episode, unspecified: Secondary | ICD-10-CM

## 2015-03-02 DIAGNOSIS — E7800 Pure hypercholesterolemia, unspecified: Secondary | ICD-10-CM

## 2015-03-02 DIAGNOSIS — F32A Depression, unspecified: Secondary | ICD-10-CM

## 2015-03-02 DIAGNOSIS — G4733 Obstructive sleep apnea (adult) (pediatric): Secondary | ICD-10-CM

## 2015-03-02 DIAGNOSIS — I10 Essential (primary) hypertension: Secondary | ICD-10-CM

## 2015-03-02 DIAGNOSIS — K219 Gastro-esophageal reflux disease without esophagitis: Secondary | ICD-10-CM

## 2015-03-02 NOTE — Progress Notes (Signed)
Orders placed for overdue labs.

## 2015-03-02 NOTE — Telephone Encounter (Signed)
Labs scheduled on 7/20 now.

## 2015-03-02 NOTE — Telephone Encounter (Signed)
Orders placed for labs.  Will refill after review labs (since no labs since 2014).

## 2015-03-04 ENCOUNTER — Encounter: Payer: Self-pay | Admitting: Internal Medicine

## 2015-03-04 ENCOUNTER — Other Ambulatory Visit: Payer: Self-pay | Admitting: Internal Medicine

## 2015-03-04 ENCOUNTER — Other Ambulatory Visit (INDEPENDENT_AMBULATORY_CARE_PROVIDER_SITE_OTHER): Payer: Medicare Other

## 2015-03-04 DIAGNOSIS — F329 Major depressive disorder, single episode, unspecified: Secondary | ICD-10-CM

## 2015-03-04 DIAGNOSIS — I10 Essential (primary) hypertension: Secondary | ICD-10-CM

## 2015-03-04 DIAGNOSIS — E7800 Pure hypercholesterolemia, unspecified: Secondary | ICD-10-CM

## 2015-03-04 DIAGNOSIS — F32A Depression, unspecified: Secondary | ICD-10-CM

## 2015-03-04 LAB — CBC WITH DIFFERENTIAL/PLATELET
Basophils Absolute: 0 10*3/uL (ref 0.0–0.1)
Basophils Relative: 0.2 % (ref 0.0–3.0)
Eosinophils Absolute: 0.8 10*3/uL — ABNORMAL HIGH (ref 0.0–0.7)
Eosinophils Relative: 12.4 % — ABNORMAL HIGH (ref 0.0–5.0)
HCT: 41.3 % (ref 36.0–46.0)
Hemoglobin: 13.9 g/dL (ref 12.0–15.0)
Lymphocytes Relative: 36.6 % (ref 12.0–46.0)
Lymphs Abs: 2.3 10*3/uL (ref 0.7–4.0)
MCHC: 33.6 g/dL (ref 30.0–36.0)
MCV: 88.8 fl (ref 78.0–100.0)
Monocytes Absolute: 0.4 10*3/uL (ref 0.1–1.0)
Monocytes Relative: 7.1 % (ref 3.0–12.0)
Neutro Abs: 2.7 10*3/uL (ref 1.4–7.7)
Neutrophils Relative %: 43.7 % (ref 43.0–77.0)
Platelets: 281 10*3/uL (ref 150.0–400.0)
RBC: 4.66 Mil/uL (ref 3.87–5.11)
RDW: 12.6 % (ref 11.5–15.5)
WBC: 6.2 10*3/uL (ref 4.0–10.5)

## 2015-03-04 LAB — LIPID PANEL
Cholesterol: 210 mg/dL — ABNORMAL HIGH (ref 0–200)
HDL: 54.6 mg/dL (ref >39.00)
LDL Cholesterol: 126 mg/dL — ABNORMAL HIGH (ref 0–99)
NonHDL: 155.4
Total CHOL/HDL Ratio: 4
Triglycerides: 145 mg/dL (ref 0.0–149.0)
VLDL: 29 mg/dL (ref 0.0–40.0)

## 2015-03-04 LAB — HEPATIC FUNCTION PANEL
ALT: 23 U/L (ref 0–35)
AST: 22 U/L (ref 0–37)
Albumin: 4 g/dL (ref 3.5–5.2)
Alkaline Phosphatase: 86 U/L (ref 39–117)
Bilirubin, Direct: 0.1 mg/dL (ref 0.0–0.3)
Total Bilirubin: 0.5 mg/dL (ref 0.2–1.2)
Total Protein: 6.6 g/dL (ref 6.0–8.3)

## 2015-03-04 LAB — BASIC METABOLIC PANEL
BUN: 15 mg/dL (ref 6–23)
CO2: 29 mEq/L (ref 19–32)
Calcium: 9.2 mg/dL (ref 8.4–10.5)
Chloride: 108 mEq/L (ref 96–112)
Creatinine, Ser: 0.81 mg/dL (ref 0.40–1.20)
GFR: 77.74 mL/min (ref >60.00)
Glucose, Bld: 96 mg/dL (ref 70–99)
Potassium: 3.5 mEq/L (ref 3.5–5.1)
Sodium: 142 mEq/L (ref 135–145)

## 2015-03-04 LAB — TSH: TSH: 0.67 u[IU]/mL (ref 0.35–4.50)

## 2015-03-04 MED ORDER — PRAVASTATIN SODIUM 10 MG PO TABS: 10.0000 mg | ORAL_TABLET | Freq: Every day | ORAL | Status: DC

## 2015-03-04 NOTE — Progress Notes (Signed)
rx sent in for pravastatin 10mg  #30 with one refill.

## 2015-03-06 NOTE — Telephone Encounter (Signed)
Unread mychart message mailed to patient 

## 2015-03-12 ENCOUNTER — Other Ambulatory Visit: Payer: Self-pay | Admitting: Internal Medicine

## 2015-03-13 NOTE — Telephone Encounter (Signed)
Last OV 02/12/14 please advise as to refill?

## 2015-03-14 NOTE — Telephone Encounter (Signed)
Per our records, the rx for pravastatin was sent in on 03/04/15 #30 tablets with one refill.

## 2015-03-16 ENCOUNTER — Other Ambulatory Visit: Payer: Self-pay | Admitting: Internal Medicine

## 2015-03-16 NOTE — Telephone Encounter (Signed)
Last OV 7.1.15.  Please advise refill

## 2015-03-16 NOTE — Telephone Encounter (Signed)
ok'd refill lopressor #30 with no refills.

## 2015-03-27 DIAGNOSIS — M47817 Spondylosis without myelopathy or radiculopathy, lumbosacral region: Secondary | ICD-10-CM | POA: Diagnosis not present

## 2015-03-27 DIAGNOSIS — G4700 Insomnia, unspecified: Secondary | ICD-10-CM | POA: Diagnosis not present

## 2015-03-27 DIAGNOSIS — Z79891 Long term (current) use of opiate analgesic: Secondary | ICD-10-CM | POA: Diagnosis not present

## 2015-03-27 DIAGNOSIS — M62830 Muscle spasm of back: Secondary | ICD-10-CM | POA: Diagnosis not present

## 2015-03-27 DIAGNOSIS — G894 Chronic pain syndrome: Secondary | ICD-10-CM | POA: Diagnosis not present

## 2015-04-01 ENCOUNTER — Ambulatory Visit: Admission: RE | Admit: 2015-04-01 | Payer: Medicare Other | Source: Ambulatory Visit

## 2015-04-06 ENCOUNTER — Other Ambulatory Visit: Payer: Self-pay | Admitting: Psychiatry

## 2015-04-07 ENCOUNTER — Ambulatory Visit: Admission: RE | Admit: 2015-04-07 | Payer: Medicare Other | Source: Ambulatory Visit

## 2015-04-09 ENCOUNTER — Ambulatory Visit: Payer: Medicare Other | Admitting: Internal Medicine

## 2015-04-09 ENCOUNTER — Telehealth: Payer: Self-pay | Admitting: Internal Medicine

## 2015-04-09 DIAGNOSIS — Z0289 Encounter for other administrative examinations: Secondary | ICD-10-CM

## 2015-04-09 NOTE — Telephone Encounter (Signed)
She will need to reschedule appt.

## 2015-04-09 NOTE — Telephone Encounter (Signed)
FYI (pt left on schedule)

## 2015-04-09 NOTE — Telephone Encounter (Signed)
Pt husband called pt is not feeling well her back is bothering her and she cannot get ready for her appt today @ 2:30p. Thank You!

## 2015-04-10 NOTE — Telephone Encounter (Signed)
Appointment was rescheduled. Done. :)

## 2015-04-17 ENCOUNTER — Other Ambulatory Visit: Payer: Self-pay | Admitting: Internal Medicine

## 2015-04-17 NOTE — Telephone Encounter (Signed)
Last OV 7.1.15.  Please advise refill 

## 2015-04-17 NOTE — Telephone Encounter (Signed)
rx sent in x 1

## 2015-04-17 NOTE — Telephone Encounter (Signed)
Ok to refill until next scheduled appt.

## 2015-04-27 DIAGNOSIS — M62830 Muscle spasm of back: Secondary | ICD-10-CM | POA: Diagnosis not present

## 2015-04-27 DIAGNOSIS — M47817 Spondylosis without myelopathy or radiculopathy, lumbosacral region: Secondary | ICD-10-CM | POA: Diagnosis not present

## 2015-04-27 DIAGNOSIS — G894 Chronic pain syndrome: Secondary | ICD-10-CM | POA: Diagnosis not present

## 2015-04-27 DIAGNOSIS — G4700 Insomnia, unspecified: Secondary | ICD-10-CM | POA: Diagnosis not present

## 2015-05-04 ENCOUNTER — Ambulatory Visit (INDEPENDENT_AMBULATORY_CARE_PROVIDER_SITE_OTHER): Payer: Medicare Other | Admitting: Internal Medicine

## 2015-05-04 ENCOUNTER — Encounter: Payer: Self-pay | Admitting: Internal Medicine

## 2015-05-04 VITALS — BP 120/80 | HR 80 | Temp 98.3°F | Resp 17 | Ht 67.0 in | Wt 200.8 lb

## 2015-05-04 DIAGNOSIS — R21 Rash and other nonspecific skin eruption: Secondary | ICD-10-CM

## 2015-05-04 DIAGNOSIS — K219 Gastro-esophageal reflux disease without esophagitis: Secondary | ICD-10-CM

## 2015-05-04 DIAGNOSIS — J4520 Mild intermittent asthma, uncomplicated: Secondary | ICD-10-CM | POA: Diagnosis not present

## 2015-05-04 DIAGNOSIS — F329 Major depressive disorder, single episode, unspecified: Secondary | ICD-10-CM

## 2015-05-04 DIAGNOSIS — M545 Low back pain: Secondary | ICD-10-CM

## 2015-05-04 DIAGNOSIS — Z1239 Encounter for other screening for malignant neoplasm of breast: Secondary | ICD-10-CM

## 2015-05-04 DIAGNOSIS — E7800 Pure hypercholesterolemia, unspecified: Secondary | ICD-10-CM

## 2015-05-04 DIAGNOSIS — F32A Depression, unspecified: Secondary | ICD-10-CM

## 2015-05-04 DIAGNOSIS — J019 Acute sinusitis, unspecified: Secondary | ICD-10-CM

## 2015-05-04 DIAGNOSIS — I10 Essential (primary) hypertension: Secondary | ICD-10-CM | POA: Diagnosis not present

## 2015-05-04 MED ORDER — PANTOPRAZOLE SODIUM 40 MG PO TBEC: 40.0000 mg | DELAYED_RELEASE_TABLET | Freq: Every day | ORAL | Status: DC

## 2015-05-04 MED ORDER — CEFUROXIME AXETIL 250 MG PO TABS: 250.0000 mg | ORAL_TABLET | Freq: Two times a day (BID) | ORAL | Status: DC

## 2015-05-04 MED ORDER — TRIAMCINOLONE ACETONIDE 0.1 % EX CREA: 1.0000 "application " | TOPICAL_CREAM | Freq: Two times a day (BID) | CUTANEOUS | Status: DC

## 2015-05-04 NOTE — Progress Notes (Signed)
Pre-visit discussion using our clinic review tool. No additional management support is needed unless otherwise documented below in the visit note.  

## 2015-05-04 NOTE — Progress Notes (Signed)
Patient ID: [name], female   DOB: 1961-03-23, 54 y.o.   MRN: [id number]   Subjective:    Patient ID: [name], female    DOB: 17-Feb-1961, 54 y.o.   MRN: [id number]  HPI  Patient with past history of hypertension, chronic back pain, asthma and hypercholesterolemia.  Here to follow up on these issues.  She is accompanied by her husband.  History obtained from both of them.  She has missed multiple appointments.  She is followed at pain clinic for her back.  Seeing Dr Jacqlyn Larsen for her bladder.  Recently evaluated and felt to have pelvic floor dysfunction.  She does report increased sinus pressure.  Present over the past week.  Increased nasal congestion, productive of green blood tinged mucus.  Ears stopped up.  Sore throat.  Some coughing.  Wheezing.  Still with some acid reflux.  Needs to change nexium.  Insurance will not cover.  Still has not followed up with GI.  Has cancelled appts.  Bowels stable.  Does report small rash - right upper arm.  Itches.    Past Medical History  Diagnosis Date  . Allergic rhinitis   . Asthma   . GERD (gastroesophageal reflux disease)   . Depression   . Anxiety   . Chronic back pain   . IBS (irritable bowel syndrome)   . Hypertension   . Sleep apnea   . Collagenous colitis     followed by Dr Tiffany Kocher  . Bronchitis, chronic   . Urine incontinence   . Migraine headache   . Ulcer disease   . Fibrocystic breast disease   . History of colon polyps   . Memory difficulty 11/15/2013  . Diplopia 11/15/2013   Past Surgical History  Procedure Laterality Date  . Nasal septoplasty w/ turbinoplasty    . Lumbar fusion  4/05 and 8/07  . Breast biopsy    . Abdominal hysterectomy      endometriosis  . Lumbar disc surgery    . Septoplasty      with reduction of turbinates  . Lumbar microdiscectomy  03/28/03    L4-5 L5  . Insertion of trial spinal cord stimulator    . Insertion of permanent spinal cord stimulator    . Removal of spinal cord stimulator    .  Dilation and curettage of uterus      and hysteroscopy   Family History  Problem Relation Age of Onset  . Allergies Mother   . Hypertension Mother   . Arthritis Mother   . Hyperlipidemia Mother   . Stroke Mother   . Asthma      several family member  . Leukemia      uncle  . Hypertension Father   . Arthritis Father   . Diabetes Father   . Hypertension Brother   . Diabetes Brother   . Arthritis Maternal Grandmother   . Hyperlipidemia Maternal Grandmother   . Hyperlipidemia Maternal Grandfather   . Arthritis Paternal Grandmother   . Arthritis Paternal Grandfather   . Lymphoma Maternal Aunt     non hodgkins   Social History   Social History  . Marital Status: Married    Spouse Name: N/A  . Number of Children: 1  . Years of Education: 47 th   Occupational History  . Disabled   .     Social History Main Topics  . Smoking status: Never Smoker   . Smokeless tobacco: Never Used  . Alcohol Use: No  .  Drug Use: No  . Sexual Activity: Not Asked   Other Topics Concern  . None   Social History Narrative    Outpatient Encounter Prescriptions as of 05/04/2015  Medication Sig  . albuterol (VENTOLIN HFA) 108 (90 BASE) MCG/ACT inhaler INHALE 2 PUFFS INTO THE LUNGS EVERY 6 HOURS AS NEEDED.  . bisacodyl (DULCOLAX) 5 MG EC tablet Take 5 mg by mouth daily as needed for constipation.  . diazepam (VALIUM) 5 MG tablet Take 5 mg by mouth 3 (three) times daily as needed.    Marland Kitchen estrogen-methylTESTOSTERone (ESTRATEST) 1.25-2.5 MG per tablet TAKE 1 TABLET BY MOUTH DAILY  . gabapentin (NEURONTIN) 600 MG tablet Take 600 mg by mouth 4 (four) times daily - after meals and at bedtime.  . metoprolol (LOPRESSOR) 50 MG tablet TAKE 1/2 TABLET BY MOUTH TWICE A DAY (NEED OFFICE VISIT)  . mometasone (ASMANEX) 220 MCG/INH inhaler Inhale 2 puffs into the lungs daily.  . Multiple Vitamin (MULTIVITAMIN) capsule Take 1 capsule by mouth daily.    Marland Kitchen omeprazole (PRILOSEC OTC) 20 MG tablet Take 20 mg by  mouth 2 (two) times daily.  Marland Kitchen OVER THE COUNTER MEDICATION i-cool (for hot flashes)  . Oxycodone HCl 10 MG TABS Take 10 mg by mouth 3 (three) times daily as needed.  . OXYCONTIN 40 MG T12A 12 hr tablet Take 40 mg by mouth every 12 (twelve) hours.  . pravastatin (PRAVACHOL) 10 MG tablet Take 1 tablet (10 mg total) by mouth daily.  Marland Kitchen topiramate (TOPAMAX) 50 MG tablet Take 50 mg by mouth daily.  Marland Kitchen venlafaxine (EFFEXOR) 37.5 MG tablet Take 37.5 mg by mouth daily.    Marland Kitchen zolpidem (AMBIEN) 10 MG tablet Take 10 mg by mouth at bedtime as needed.    . cefUROXime (CEFTIN) 250 MG tablet Take 1 tablet (250 mg total) by mouth 2 (two) times daily with a meal.  . pantoprazole (PROTONIX) 40 MG tablet Take 1 tablet (40 mg total) by mouth daily.  Marland Kitchen triamcinolone cream (KENALOG) 0.1 % Apply 1 application topically 2 (two) times daily. Do not use more than 7-10 days in one area  . [DISCONTINUED] amoxicillin-clavulanate (AUGMENTIN) 875-125 MG per tablet Take 1 tablet by mouth 2 (two) times daily.  . [DISCONTINUED] esomeprazole (NEXIUM) 40 MG capsule TAKE 1 CAPSULE EVERY DAY BEFORE BREAKFAST  . [DISCONTINUED] MYRBETRIQ 50 MG TB24 tablet Take 50 mg by mouth daily.  . [DISCONTINUED] oxyCODONE-acetaminophen (TYLOX) 5-500 MG per capsule Take 1 capsule by mouth every 8 (eight) hours as needed.   . [DISCONTINUED] OXYCONTIN 80 MG T12A 12 hr tablet Take 40 mg by mouth every 12 (twelve) hours.   . [DISCONTINUED] pravastatin (PRAVACHOL) 10 MG tablet TAKE 1 TABLET (10 MG TOTAL) BY MOUTH DAILY.   No facility-administered encounter medications on file as of 05/04/2015.    Review of Systems  Constitutional: Negative for appetite change and unexpected weight change.  HENT: Positive for congestion, postnasal drip, sinus pressure and sore throat.        Ear fullness and feeling stopped up.    Eyes: Negative for pain and visual disturbance.  Respiratory: Positive for cough. Negative for chest tightness and shortness of breath.     Cardiovascular: Negative for chest pain, palpitations and leg swelling.  Gastrointestinal: Negative for nausea, vomiting, abdominal pain and diarrhea.  Genitourinary: Positive for difficulty urinating (followed by Dr Jacqlyn Larsen.  ). Negative for dysuria.  Musculoskeletal: Positive for back pain (chronic. ). Negative for joint swelling.  Skin: Positive for rash (small  rash - upper right arm. ). Negative for color change.  Neurological: Negative for dizziness, light-headedness and headaches.  Psychiatric/Behavioral: Negative for dysphoric mood and agitation.       Objective:    Physical Exam  Constitutional: She appears well-developed and well-nourished. No distress.  HENT:  Mouth/Throat: Oropharynx is clear and moist.  slighty erythematous turbinates.  Increased tenderness to palpation over the sinuses.    Eyes: Conjunctivae are normal. Right eye exhibits no discharge. Left eye exhibits no discharge.  Neck: Neck supple. No thyromegaly present.  Cardiovascular: Normal rate and regular rhythm.   Pulmonary/Chest: Breath sounds normal. No respiratory distress. She has no wheezes.  Abdominal: Soft. Bowel sounds are normal. There is no tenderness.  Musculoskeletal: She exhibits no edema or tenderness.  Lymphadenopathy:    She has no cervical adenopathy.  Skin: No erythema.  Small pin point lesion - right upper arm.  Some dryness surrounding.  No increased erythema.    Psychiatric: She has a normal mood and affect. Her behavior is normal.    BP 120/80 mmHg  Pulse 80  Temp(Src) 98.3 F (36.8 C) (Oral)  Resp 17  Ht 5\' 7"  (1.702 m)  Wt 200 lb 12 oz (91.06 kg)  BMI 31.43 kg/m2  SpO2 96% Wt Readings from Last 3 Encounters:  05/04/15 200 lb 12 oz (91.06 kg)  02/12/14 192 lb 4 oz (87.204 kg)  11/15/13 192 lb (87.091 kg)     Lab Results  Component Value Date   WBC 6.2 03/04/2015   HGB 13.9 03/04/2015   HCT 41.3 03/04/2015   PLT 281.0 03/04/2015   GLUCOSE 96 03/04/2015   CHOL 210*  03/04/2015   TRIG 145.0 03/04/2015   HDL 54.60 03/04/2015   LDLCALC 126* 03/04/2015   ALT 23 03/04/2015   AST 22 03/04/2015   NA 142 03/04/2015   K 3.5 03/04/2015   CL 108 03/04/2015   CREATININE 0.81 03/04/2015   BUN 15 03/04/2015   CO2 29 03/04/2015   TSH 0.67 03/04/2015       Assessment & Plan:   Problem List Items Addressed This Visit    Asthma    Continue inhaler use.  Treat current infection.  Do not feel prednisone warranted.  Follow.        Back pain    Chronic back pain.  Followed at pain clinic.        Relevant Medications   Oxycodone HCl 10 MG TABS   OXYCONTIN 40 MG T12A 12 hr tablet   Breast cancer screening    Has cancelled her mammogram appt.  Will reschedule.        Depression    Seeing psychiatry.  Appears to be stable.        Essential hypertension, benign - Primary    Blood pressure under good control.  Continue same medication regimen.  Follow pressures.  Follow metabolic panel.        Relevant Orders   Basic metabolic panel   GERD    Reflux symptoms and dysphagia as outlined.  Still has not followed up with GI.  Has cancelled multiple appts.  Plans to reschedule.  Needs to change nexium.  Start protonix.  Follow.       Relevant Medications   pantoprazole (PROTONIX) 40 MG tablet   Hypercholesterolemia    Cholesterol elevated from previous check.  Discussed diet and exercise.  Continue pravastatin.  Follow.  If persistent elevation, will increase pravastatin.        Relevant  Orders   Lipid panel   Hepatic function panel   Rash    Triamcinolone cream as outlined.  Notify me if persistent.        Sinusitis, acute    Symptoms and exam as outlined.  Treat with ceftin as directed.  Saline nasal spray and nasacort nasal spray as outlined.  mucinex in the am and robitussin in the evening.  Continue inhalers.  Follow.       Relevant Medications   cefUROXime (CEFTIN) 250 MG tablet       Einar Pheasant, MD

## 2015-05-04 NOTE — Patient Instructions (Signed)
Saline nasal spray - flush nose at least 2-3x/day  nasacort nasal spray - 2 sprays each nostril one time per day.  Do this in the evening.    mucinex in the am and robitussin in the evening.    Take a probiotic (align) - one per day while on the antibiotics and for two weeks after completing the antibiotics.

## 2015-05-05 ENCOUNTER — Encounter: Payer: Self-pay | Admitting: Internal Medicine

## 2015-05-05 DIAGNOSIS — Z1239 Encounter for other screening for malignant neoplasm of breast: Secondary | ICD-10-CM | POA: Insufficient documentation

## 2015-05-05 DIAGNOSIS — R21 Rash and other nonspecific skin eruption: Secondary | ICD-10-CM | POA: Insufficient documentation

## 2015-05-05 NOTE — Assessment & Plan Note (Signed)
Blood pressure under good control.  Continue same medication regimen.  Follow pressures.  Follow metabolic panel.   

## 2015-05-05 NOTE — Assessment & Plan Note (Signed)
Continue inhaler use.  Treat current infection.  Do not feel prednisone warranted.  Follow.

## 2015-05-05 NOTE — Assessment & Plan Note (Signed)
Symptoms and exam as outlined.  Treat with ceftin as directed.  Saline nasal spray and nasacort nasal spray as outlined.  mucinex in the am and robitussin in the evening.  Continue inhalers.  Follow.

## 2015-05-05 NOTE — Assessment & Plan Note (Signed)
Chronic back pain.  Followed at pain clinic.

## 2015-05-05 NOTE — Assessment & Plan Note (Signed)
Reflux symptoms and dysphagia as outlined.  Still has not followed up with GI.  Has cancelled multiple appts.  Plans to reschedule.  Needs to change nexium.  Start protonix.  Follow.

## 2015-05-05 NOTE — Assessment & Plan Note (Signed)
Triamcinolone cream as outlined.  Notify me if persistent.

## 2015-05-05 NOTE — Assessment & Plan Note (Signed)
Cholesterol elevated from previous check.  Discussed diet and exercise.  Continue pravastatin.  Follow.  If persistent elevation, will increase pravastatin.

## 2015-05-05 NOTE — Assessment & Plan Note (Signed)
Seeing psychiatry.  Appears to be stable.  

## 2015-05-05 NOTE — Assessment & Plan Note (Signed)
Has cancelled her mammogram appt.  Will reschedule.

## 2015-05-15 ENCOUNTER — Other Ambulatory Visit: Payer: Self-pay | Admitting: Internal Medicine

## 2015-05-20 ENCOUNTER — Ambulatory Visit: Payer: Self-pay | Admitting: Licensed Clinical Social Worker

## 2015-05-25 DIAGNOSIS — G894 Chronic pain syndrome: Secondary | ICD-10-CM | POA: Diagnosis not present

## 2015-05-25 DIAGNOSIS — Z86010 Personal history of colonic polyps: Secondary | ICD-10-CM | POA: Diagnosis not present

## 2015-05-25 DIAGNOSIS — G4700 Insomnia, unspecified: Secondary | ICD-10-CM | POA: Diagnosis not present

## 2015-05-25 DIAGNOSIS — R1319 Other dysphagia: Secondary | ICD-10-CM | POA: Diagnosis not present

## 2015-05-25 DIAGNOSIS — M47817 Spondylosis without myelopathy or radiculopathy, lumbosacral region: Secondary | ICD-10-CM | POA: Diagnosis not present

## 2015-05-25 DIAGNOSIS — K5909 Other constipation: Secondary | ICD-10-CM | POA: Diagnosis not present

## 2015-05-25 DIAGNOSIS — IMO0001 Reserved for inherently not codable concepts without codable children: Secondary | ICD-10-CM | POA: Insufficient documentation

## 2015-05-25 DIAGNOSIS — M62830 Muscle spasm of back: Secondary | ICD-10-CM | POA: Diagnosis not present

## 2015-05-25 DIAGNOSIS — K589 Irritable bowel syndrome without diarrhea: Secondary | ICD-10-CM | POA: Diagnosis not present

## 2015-05-30 ENCOUNTER — Other Ambulatory Visit: Payer: Self-pay | Admitting: Internal Medicine

## 2015-06-11 ENCOUNTER — Ambulatory Visit: Payer: Self-pay | Admitting: Psychiatry

## 2015-06-14 ENCOUNTER — Other Ambulatory Visit: Payer: Self-pay | Admitting: Internal Medicine

## 2015-06-16 ENCOUNTER — Other Ambulatory Visit: Payer: Self-pay | Admitting: Internal Medicine

## 2015-06-16 ENCOUNTER — Ambulatory Visit (INDEPENDENT_AMBULATORY_CARE_PROVIDER_SITE_OTHER): Payer: Medicare Other | Admitting: Licensed Clinical Social Worker

## 2015-06-16 DIAGNOSIS — F329 Major depressive disorder, single episode, unspecified: Secondary | ICD-10-CM | POA: Diagnosis not present

## 2015-06-16 DIAGNOSIS — F32A Depression, unspecified: Secondary | ICD-10-CM

## 2015-06-16 NOTE — Progress Notes (Signed)
Patient:   [name]   DOB:   [birth year]  MR Number:  [id number]  Location:  [facility] [address] [place] [address] Dept: [phone]           Date of Service:   06/16/2015  Start Time:   1p End Time:   2p  Provider/Observer:  Lubertha South Counselor       Billing Code/Service: [phone]  Behavioral Observation: [name]  presents as a 54 y.o.-year-old Caucasian Female who appeared her stated age. her dress was Appropriate and she was Casual and her manners were Appropriate to the situation.  There were not any physical disabilities noted.  she displayed an appropriate level of cooperation and motivation.    Interactions:    Active   Attention:   within normal limits  Memory:   within normal limits  Speech (Volume):  normal  Speech:   normal volume  Thought Process:  Coherent  Though Content:  WNL  Orientation:   person, place, time/date and situation  Judgment:   Fair  Planning:   Fair  Affect:    Appropriate  Mood:    Depressed  Insight:   Good  Intelligence:   normal  Chief Complaint:     Chief Complaint  Patient presents with  . Depression  . Establish Care    Reason for Service:  "To get my Effexor and Topomax; I will be fine."  Current Symptoms:  Was a patient of Dr. 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  I was always shy" Had an after school job  Legal History:  Denies  Human resources officer:  Denies   Religious/Spiritual Preferences:  [employer]  Family/Childhood History:                           Born in [place]; has an older brother and sister; she is the youngest; describes childhood as "daddy is a Environmental health practitioner, moved around a lot, was 53 schools in 12 years, we didn't have a lot of money, we loved each other, it was good"   Children/Grand-children:    Brandi 22/3 Grandchildren ages 22-12  Natural/Informal Support:                           "I don't like to talk to anyone but I talk to my mom or sister when I am having a good day.     Substance Use:  No concerns of substance abuse are reported.     Medical History:   Past Medical History  Diagnosis Date  . Allergic rhinitis   . Asthma   . GERD (gastroesophageal reflux disease)   . Depression   . Anxiety   . Chronic back pain   . IBS (irritable bowel syndrome)   . Hypertension   . Sleep apnea   . Collagenous colitis  followed by Dr Tiffany Kocher  . Bronchitis, chronic   . Urine incontinence   . Migraine headache   . Ulcer disease   . Fibrocystic breast disease   . History of colon polyps   . Memory difficulty 11/15/2013  . Diplopia 11/15/2013          Medication List       This list is accurate as of: 06/16/15  1:38 PM.  Always use your most recent med list.               albuterol 108 (90 BASE) MCG/ACT inhaler  Commonly known as:  VENTOLIN HFA  INHALE 2 PUFFS INTO THE LUNGS EVERY 6 HOURS AS NEEDED.     AMBIEN 10 MG tablet  Generic drug:  zolpidem  Take 10 mg by mouth at bedtime as needed.     bisacodyl 5 MG EC tablet  Commonly known as:  DULCOLAX  Take 5 mg by mouth daily as needed for constipation.     cefUROXime 250 MG tablet  Commonly known as:  CEFTIN  Take 1 tablet (250 mg total) by mouth 2 (two) times daily with a meal.     diazepam 5 MG tablet  Commonly known as:  VALIUM  Take 5 mg by mouth 3 (three) times daily as needed.     estrogen-methylTESTOSTERone 1.25-2.5 MG tablet   Commonly known as:  ESTRATEST  TAKE 1 TABLET BY MOUTH DAILY     gabapentin 600 MG tablet  Commonly known as:  NEURONTIN  Take 600 mg by mouth 4 (four) times daily - after meals and at bedtime.     metoprolol 50 MG tablet  Commonly known as:  LOPRESSOR  Take 0.5 tablets (25 mg total) by mouth 2 (two) times daily.     mometasone 220 MCG/INH inhaler  Commonly known as:  ASMANEX  Inhale 2 puffs into the lungs daily.     multivitamin capsule  Take 1 capsule by mouth daily.     omeprazole 20 MG tablet  Commonly known as:  PRILOSEC OTC  Take 20 mg by mouth 2 (two) times daily.     OVER THE COUNTER MEDICATION  i-cool (for hot flashes)     Oxycodone HCl 10 MG Tabs  Take 10 mg by mouth 3 (three) times daily as needed.     OXYCONTIN 40 mg 12 hr tablet  Generic drug:  oxyCODONE  Take 40 mg by mouth every 12 (twelve) hours.     pantoprazole 40 MG tablet  Commonly known as:  PROTONIX  Take 1 tablet (40 mg total) by mouth daily.     pravastatin 10 MG tablet  Commonly known as:  PRAVACHOL  TAKE 1 TABLET (10 MG TOTAL) BY MOUTH DAILY.     topiramate 50 MG tablet  Commonly known as:  TOPAMAX  TAKE 2 TABLETS BY MOUTH TWICE A DAY     triamcinolone cream 0.1 %  Commonly known as:  KENALOG  Apply 1 application topically 2 (two) times daily. Do not use more than 7-10 days in one area     venlafaxine 37.5 MG tablet  Commonly known as:  EFFEXOR  Take 37.5 mg by mouth daily.              Sexual History:   History  Sexual Activity  . Sexual Activity: Not on file     Abuse/Trauma History: Denies   Psychiatric History:  Dr. Bridgett Larsson about 12 years ago; Dr. Mamie Nick for several years about  6 months ago; did not have psychotherapy   Strengths:   Supportive, loving, not a whiner, never complains, good Grandmother & wife   Recovery Goals:  "To get my Effexor and Topomax; I will be fine."  Hobbies/Interests:               "I can't do  anything"   Challenges/Barriers: pain    Family Med/Psych History:  Family History  Problem Relation Age of Onset  . Allergies Mother   . Hypertension Mother   . Arthritis Mother   . Hyperlipidemia Mother   . Stroke Mother   . Asthma      several family member  . Leukemia      uncle  . Hypertension Father   . Arthritis Father   . Diabetes Father   . Hypertension Brother   . Diabetes Brother   . Arthritis Maternal Grandmother   . Hyperlipidemia Maternal Grandmother   . Hyperlipidemia Maternal Grandfather   . Arthritis Paternal Grandmother   . Arthritis Paternal Grandfather   . Lymphoma Maternal Aunt     non hodgkins    Risk of Suicide/Violence: low   History of Suicide/Violence:  Passive SI about 12 years ago  Psychosis:   Denies   Diagnosis:    Major Depressive Disorder, Recurrent, Moderate  Impression/DX:  [name] is currently diagnosed with Major Depressive Disorder due to her current symptoms Was a patient of Dr. Mamie Nick; chronic pain, 6 back surgeries, depression, permanent nerve damage, problems from first surgery, on disability, home all the time, lies in the bed, sitting hurts, at home all the time.  She has a history of Psychiatric care; Dr. Bridgett Larsson about 12 years ago; Dr. Mamie Nick for several years about 6 months ago; did not have psychotherapy.  [name] will be best supported by medication management and outpatient therapy to assist with coping skills and understanding her triggers.  [name] has passive SI attempts about 12 years ago and currently denies SI or Hi.  [name] is engaged and has a few healthy relationships with friends and family.  Recommendation/Plan: Writer recommends Outpatient Therapy at least twice monthly to include but not limited to individual, group and or family therapy.  Medication Management is also recommended to assist with her mood.

## 2015-06-19 ENCOUNTER — Ambulatory Visit: Admission: RE | Admit: 2015-06-19 | Payer: Medicare Other | Source: Ambulatory Visit

## 2015-06-24 ENCOUNTER — Encounter: Payer: Self-pay | Admitting: *Deleted

## 2015-06-25 ENCOUNTER — Encounter
Admission: RE | Disposition: A | Discharge status: Home Health | Payer: Self-pay | Source: Ambulatory Visit | Attending: Unknown Physician Specialty

## 2015-06-25 ENCOUNTER — Ambulatory Visit
Admission: RE | Admit: 2015-06-25 | Discharge: 2015-06-25 | Disposition: A | Discharge status: Home Health | Payer: Medicare Other | Source: Ambulatory Visit | Attending: Unknown Physician Specialty | Admitting: Unknown Physician Specialty

## 2015-06-25 ENCOUNTER — Encounter: Payer: Self-pay | Admitting: *Deleted

## 2015-06-25 ENCOUNTER — Ambulatory Visit: Payer: Medicare Other | Admitting: Anesthesiology

## 2015-06-25 DIAGNOSIS — K589 Irritable bowel syndrome without diarrhea: Secondary | ICD-10-CM | POA: Diagnosis not present

## 2015-06-25 DIAGNOSIS — K3189 Other diseases of stomach and duodenum: Secondary | ICD-10-CM | POA: Diagnosis not present

## 2015-06-25 DIAGNOSIS — K2950 Unspecified chronic gastritis without bleeding: Secondary | ICD-10-CM | POA: Insufficient documentation

## 2015-06-25 DIAGNOSIS — K640 First degree hemorrhoids: Secondary | ICD-10-CM | POA: Insufficient documentation

## 2015-06-25 DIAGNOSIS — R1310 Dysphagia, unspecified: Secondary | ICD-10-CM | POA: Insufficient documentation

## 2015-06-25 DIAGNOSIS — K219 Gastro-esophageal reflux disease without esophagitis: Secondary | ICD-10-CM | POA: Diagnosis not present

## 2015-06-25 DIAGNOSIS — Z9109 Other allergy status, other than to drugs and biological substances: Secondary | ICD-10-CM | POA: Diagnosis not present

## 2015-06-25 DIAGNOSIS — M549 Dorsalgia, unspecified: Secondary | ICD-10-CM | POA: Diagnosis not present

## 2015-06-25 DIAGNOSIS — D123 Benign neoplasm of transverse colon: Secondary | ICD-10-CM | POA: Insufficient documentation

## 2015-06-25 DIAGNOSIS — K2970 Gastritis, unspecified, without bleeding: Secondary | ICD-10-CM | POA: Diagnosis not present

## 2015-06-25 DIAGNOSIS — Z881 Allergy status to other antibiotic agents status: Secondary | ICD-10-CM | POA: Diagnosis not present

## 2015-06-25 DIAGNOSIS — K635 Polyp of colon: Secondary | ICD-10-CM | POA: Diagnosis not present

## 2015-06-25 DIAGNOSIS — H532 Diplopia: Secondary | ICD-10-CM | POA: Insufficient documentation

## 2015-06-25 DIAGNOSIS — F329 Major depressive disorder, single episode, unspecified: Secondary | ICD-10-CM | POA: Insufficient documentation

## 2015-06-25 DIAGNOSIS — G43909 Migraine, unspecified, not intractable, without status migrainosus: Secondary | ICD-10-CM | POA: Diagnosis not present

## 2015-06-25 DIAGNOSIS — Z79899 Other long term (current) drug therapy: Secondary | ICD-10-CM | POA: Insufficient documentation

## 2015-06-25 DIAGNOSIS — J45909 Unspecified asthma, uncomplicated: Secondary | ICD-10-CM | POA: Insufficient documentation

## 2015-06-25 DIAGNOSIS — K2900 Acute gastritis without bleeding: Secondary | ICD-10-CM | POA: Diagnosis not present

## 2015-06-25 DIAGNOSIS — Z86010 Personal history of colonic polyps: Secondary | ICD-10-CM | POA: Diagnosis not present

## 2015-06-25 DIAGNOSIS — F419 Anxiety disorder, unspecified: Secondary | ICD-10-CM | POA: Diagnosis not present

## 2015-06-25 DIAGNOSIS — I10 Essential (primary) hypertension: Secondary | ICD-10-CM | POA: Insufficient documentation

## 2015-06-25 DIAGNOSIS — K648 Other hemorrhoids: Secondary | ICD-10-CM | POA: Diagnosis not present

## 2015-06-25 DIAGNOSIS — G4730 Sleep apnea, unspecified: Secondary | ICD-10-CM | POA: Diagnosis not present

## 2015-06-25 HISTORY — PX: ESOPHAGOGASTRODUODENOSCOPY: SHX5428

## 2015-06-25 HISTORY — DX: Endometriosis, unspecified: N80.9

## 2015-06-25 HISTORY — DX: Dysphagia, unspecified: R13.10

## 2015-06-25 HISTORY — DX: Hypokalemia: E87.6

## 2015-06-25 HISTORY — PX: COLONOSCOPY WITH PROPOFOL: SHX5780

## 2015-06-25 HISTORY — DX: Pain in unspecified joint: M25.50

## 2015-06-25 SURGERY — COLONOSCOPY WITH PROPOFOL
Anesthesia: General

## 2015-06-25 MED ORDER — SODIUM CHLORIDE 0.9 % IV SOLN
INTRAVENOUS | Status: DC
  Administered 2015-06-25: 15:00:00 via INTRAVENOUS

## 2015-06-25 MED ORDER — MIDAZOLAM HCL 2 MG/2ML IJ SOLN
INTRAMUSCULAR | Status: DC | PRN
  Administered 2015-06-25: 2 mg via INTRAVENOUS

## 2015-06-25 MED ORDER — PROPOFOL 500 MG/50ML IV EMUL
INTRAVENOUS | Status: DC | PRN
  Administered 2015-06-25: 120 ug/kg/min via INTRAVENOUS

## 2015-06-25 MED ORDER — SODIUM CHLORIDE 0.9 % IV SOLN: INTRAVENOUS | Status: DC

## 2015-06-25 MED ORDER — LIDOCAINE HCL (CARDIAC) 20 MG/ML IV SOLN
INTRAVENOUS | Status: DC | PRN
  Administered 2015-06-25: 40 mg via INTRAVENOUS

## 2015-06-25 NOTE — H&P (Signed)
Primary Care Physician:  Einar Pheasant, MD Primary Gastroenterologist:  Dr. Vira Agar  Pre-Procedure History & Physical: HPI:  [name] is a 54 y.o. female is here for an endoscopy and colonoscopy.   Past Medical History  Diagnosis Date  . Allergic rhinitis   . Asthma   . GERD (gastroesophageal reflux disease)   . Depression   . Anxiety   . Chronic back pain   . IBS (irritable bowel syndrome)   . Hypertension   . Sleep apnea   . Collagenous colitis     followed by Dr Tiffany Kocher  . Bronchitis, chronic ([place])   . Urine incontinence   . Migraine headache   . Ulcer disease   . Fibrocystic breast disease   . History of colon polyps   . Memory difficulty 11/15/2013  . Diplopia 11/15/2013  . Dysphagia   . Hypokalemia   . Arthralgia   . Endometriosis     Past Surgical History  Procedure Laterality Date  . Nasal septoplasty w/ turbinoplasty    . Lumbar fusion  4/05 and 8/07  . Abdominal hysterectomy      endometriosis  . Lumbar disc surgery    . Septoplasty      with reduction of turbinates  . Lumbar microdiscectomy  03/28/03    L4-5 L5  . Insertion of trial spinal cord stimulator    . Insertion of permanent spinal cord stimulator    . Removal of spinal cord stimulator    . Dilation and curettage of uterus      and hysteroscopy  . Breast biopsy Right   . Back surgery      x6    Prior to Admission medications   Medication Sig Start Date End Date Taking? Authorizing Provider  metoprolol (LOPRESSOR) 50 MG tablet Take 0.5 tablets (25 mg total) by mouth 2 (two) times daily. 06/14/15  Yes Einar Pheasant, MD  OXYCONTIN 40 MG T12A 12 hr tablet Take 40 mg by mouth every 12 (twelve) hours. 04/02/15  Yes Historical Provider, MD  albuterol (VENTOLIN HFA) 108 (90 BASE) MCG/ACT inhaler INHALE 2 PUFFS INTO THE LUNGS EVERY 6 HOURS AS NEEDED. 02/26/14   Einar Pheasant, MD  bisacodyl (DULCOLAX) 5 MG EC tablet Take 5 mg by mouth daily as needed for constipation.    Historical Provider,  MD  cefUROXime (CEFTIN) 250 MG tablet Take 1 tablet (250 mg total) by mouth 2 (two) times daily with a meal. 05/04/15   Einar Pheasant, MD  diazepam (VALIUM) 5 MG tablet Take 5 mg by mouth 3 (three) times daily as needed.      Historical Provider, MD  estrogen-methylTESTOSTERone (ESTRATEST) 1.25-2.5 MG per tablet TAKE 1 TABLET BY MOUTH DAILY 01/10/14   Einar Pheasant, MD  gabapentin (NEURONTIN) 600 MG tablet Take 600 mg by mouth 4 (four) times daily - after meals and at bedtime. 11/02/13   Historical Provider, MD  metoprolol (LOPRESSOR) 50 MG tablet TAKE 1/2 TABLET BY MOUTH TWICE A DAY (NEED OFFICE VISIT) 06/17/15   Einar Pheasant, MD  mometasone [facility]) 220 MCG/INH inhaler Inhale 2 puffs into the lungs daily. 03/18/14   Einar Pheasant, MD  Multiple Vitamin (MULTIVITAMIN) capsule Take 1 capsule by mouth daily.      Historical Provider, MD  omeprazole (PRILOSEC OTC) 20 MG tablet Take 20 mg by mouth 2 (two) times daily.    Historical Provider, MD  OVER THE COUNTER MEDICATION i-cool (for hot flashes)    Historical Provider, MD  Oxycodone HCl 10  MG TABS Take 10 mg by mouth 3 (three) times daily as needed. 04/02/15   Historical Provider, MD  pantoprazole (PROTONIX) 40 MG tablet Take 1 tablet (40 mg total) by mouth daily. 05/04/15   Einar Pheasant, MD  pravastatin (PRAVACHOL) 10 MG tablet TAKE 1 TABLET (10 MG TOTAL) BY MOUTH DAILY. 05/30/15   Einar Pheasant, MD  topiramate (TOPAMAX) 50 MG tablet TAKE 2 TABLETS BY MOUTH TWICE A DAY 06/14/15   Jolanta B Pucilowska, MD  triamcinolone cream (KENALOG) 0.1 % Apply 1 application topically 2 (two) times daily. Do not use more than 7-10 days in one area 05/04/15   Einar Pheasant, MD  venlafaxine (EFFEXOR) 37.5 MG tablet Take 37.5 mg by mouth daily.      Historical Provider, MD  zolpidem (AMBIEN) 10 MG tablet Take 10 mg by mouth at bedtime as needed.      Historical Provider, MD    Allergies as of 05/29/2015 - Review Complete 05/05/2015  Allergen Reaction Noted  .  Buspirone hcl    . Conjugated estrogens  05/25/2007  . Cymbalta [duloxetine hcl] Other (See Comments) 09/25/2012  . Doxycycline    . Erythromycin ethylsuccinate  09/25/2012  . Estradiol    . Fentanyl    . Metronidazole    . Mirtazapine    . Montelukast sodium    . Oxymorphone    . Sumatriptan  05/25/2007    Family History  Problem Relation Age of Onset  . Allergies Mother   . Hypertension Mother   . Arthritis Mother   . Hyperlipidemia Mother   . Stroke Mother   . Asthma      several family member  . Leukemia      uncle  . Hypertension Father   . Arthritis Father   . Diabetes Father   . Hypertension Brother   . Diabetes Brother   . Arthritis Maternal Grandmother   . Hyperlipidemia Maternal Grandmother   . Hyperlipidemia Maternal Grandfather   . Arthritis Paternal Grandmother   . Arthritis Paternal Grandfather   . Lymphoma Maternal Aunt     non hodgkins    Social History   Social History  . Marital Status: Married    Spouse Name: N/A  . Number of Children: 1  . Years of Education: 88 th   Occupational History  . Disabled   .     Social History Main Topics  . Smoking status: Never Smoker   . Smokeless tobacco: Never Used  . Alcohol Use: No  . Drug Use: No  . Sexual Activity: Not on file   Other Topics Concern  . Not on file   Social History Narrative    Review of Systems: See HPI, otherwise negative ROS  Physical Exam: BP 142/66 mmHg  Pulse 64  Temp(Src) 97.2 F (36.2 C) (Tympanic)  Resp 16  Ht 5\' 7"  (1.702 m)  Wt 88.451 kg (195 lb)  BMI 30.53 kg/m2  SpO2 100% General:   Alert,  pleasant and cooperative in NAD Head:  Normocephalic and atraumatic. Neck:  Supple; no masses or thyromegaly. Lungs:  Clear throughout to auscultation.    Heart:  Regular rate and rhythm. Abdomen:  Soft, nontender and nondistended. Normal bowel sounds, without guarding, and without rebound.   Neurologic:  Alert and  oriented x4;  grossly normal  neurologically.  Impression/Plan: [name] is here for an endoscopy and colonoscopy to be performed for dysphagia and colon polyps  Risks, benefits, limitations, and alternatives regarding  endoscopy  and colonoscopy have been reviewed with the patient.  Questions have been answered.  All parties agreeable.   Gaylyn Cheers, MD  06/25/2015, 2:30 PM

## 2015-06-25 NOTE — Anesthesia Procedure Notes (Signed)
Performed by: COOK-MARTIN, Polk Minor Pre-anesthesia Checklist: Patient identified, Emergency Drugs available, Suction available, Patient being monitored and Timeout performed Patient Re-evaluated:Patient Re-evaluated prior to inductionOxygen Delivery Method: Nasal cannula Preoxygenation: Pre-oxygenation with 100% oxygen Intubation Type: IV induction Airway Equipment and Method: Bite block Placement Confirmation: CO2 detector and positive ETCO2     

## 2015-06-25 NOTE — Anesthesia Preprocedure Evaluation (Signed)
Anesthesia Evaluation  Patient identified by MRN, date of birth, ID band Patient awake    Reviewed: Allergy & Precautions, NPO status , Patient's Chart, lab work & pertinent test results  Airway Mallampati: IV  TM Distance: <3 FB Neck ROM: Full    Dental  (+) Upper Dentures, Lower Dentures   Pulmonary asthma , sleep apnea ,  Allergic rhinitis.....chronic bronchitis   Pulmonary exam normal        Cardiovascular hypertension, Pt. on medications Normal cardiovascular exam     Neuro/Psych  Headaches, Anxiety Depression Memory difficulty...diplopia  Neuromuscular disease    GI/Hepatic Neg liver ROS, GERD  Medicated and Controlled,IBS...dysphagia Sxs..colonoscopy polyp history   Endo/Other  negative endocrine ROS  Renal/GU negative Renal ROS  negative genitourinary   Musculoskeletal Chronic back pain   Abdominal Normal abdominal exam  (+)   Peds negative pediatric ROS (+)  Hematology negative hematology ROS (+)   Anesthesia Other Findings Dermatomyositis...fibrocystic breast disease...spinal cord stimulator.Marland KitchenMarland KitchenFentanyl allergy gives her headaches.Marland KitchenMarland KitchenOK with dilaudid  Reproductive/Obstetrics                             Anesthesia Physical Anesthesia Plan  ASA: III  Anesthesia Plan: General   Post-op Pain Management:    Induction: Intravenous  Airway Management Planned:   Additional Equipment:   Intra-op Plan:   Post-operative Plan:   Informed Consent: I have reviewed the patients History and Physical, chart, labs and discussed the procedure including the risks, benefits and alternatives for the proposed anesthesia with the patient or authorized representative who has indicated his/her understanding and acceptance.   Dental advisory given  Plan Discussed with: CRNA and Surgeon  Anesthesia Plan Comments:         Anesthesia Quick Evaluation

## 2015-06-25 NOTE — Op Note (Signed)
[facility] Gastroenterology Patient Name: [name] Procedure Date: 06/25/2015 2:35 PM MRN: [id number] Account #: [id number] Date of Birth: 1961-07-15 Admit Type: Outpatient Age: 54 Room: [facility] ENDO ROOM 1 Gender: Female Note Status: Finalized Procedure:         Upper GI endoscopy Indications:       Dysphagia Providers:         Manya Silvas, MD Referring MD:      Einar Pheasant, MD (Referring MD) Medicines:         Propofol per Anesthesia Complications:     No immediate complications. Procedure:         Pre-Anesthesia Assessment:                    - After reviewing the risks and benefits, the patient was                     deemed in satisfactory condition to undergo the procedure.                    After obtaining informed consent, the endoscope was passed                     under direct vision. Throughout the procedure, the                     patient's blood pressure, pulse, and oxygen saturations                     were monitored continuously. The Endoscope was introduced                     through the mouth, and advanced to the second part of                     duodenum. The upper GI endoscopy was accomplished without                     difficulty. The patient tolerated the procedure well. Findings:      The examined esophagus was normal. At the end of the procedure A       guidewire was placed and the scope was withdrawn. Dilation was performed       with a Savary dilator with no resistance at 17 mm.      Patchy mild inflammation characterized by erosions and erythema was       found in the gastric body and in the gastric antrum. Biopsies for       histology were taken with a cold forceps for for evaluation of celiac       disease. Biopsies were taken with a cold forceps for histology.      The examined duodenum was normal. Impression:        - Normal esophagus. Dilated.                    - Gastritis. Biopsied.                    - Normal  examined duodenum. Recommendation:    - soft food for 3 days, eat slowly, chew well, take small                     bites Manya Silvas, MD 06/25/2015 2:48:47 PM This report has been signed electronically. Number  of Addenda: 0 Note Initiated On: 06/25/2015 2:35 PM      [facility]

## 2015-06-25 NOTE — Transfer of Care (Signed)
Immediate Anesthesia Transfer of Care Note  Patient: [name]  Procedure(s) Performed: Procedure(s): COLONOSCOPY WITH PROPOFOL (N/A) ESOPHAGOGASTRODUODENOSCOPY (EGD) (N/A)  Patient Location: PACU  Anesthesia Type:General  Level of Consciousness: awake and sedated  Airway & Oxygen Therapy: Patient Spontanous Breathing and Patient connected to nasal cannula oxygen  Post-op Assessment: Report given to RN and Post -op Vital signs reviewed and stable  Post vital signs: Reviewed and stable  Last Vitals:  Filed Vitals:   06/25/15 1401  BP: 142/66  Pulse: 64  Temp: 36.2 C  Resp: 16    Complications: No apparent anesthesia complications

## 2015-06-25 NOTE — Op Note (Signed)
[facility] Gastroenterology Patient Name: [name] Procedure Date: 06/25/2015 2:34 PM MRN: [id number] Account #: [id number] Date of Birth: Dec 31, 1960 Admit Type: Outpatient Age: 54 Room: [facility] ENDO ROOM 1 Gender: Female Note Status: Finalized Procedure:         Colonoscopy Indications:       High risk colon cancer surveillance: Personal history of                     colonic polyps Providers:         Manya Silvas, MD Referring MD:      Einar Pheasant, MD (Referring MD) Medicines:         Propofol per Anesthesia Complications:     No immediate complications. Procedure:         Pre-Anesthesia Assessment:                    - After reviewing the risks and benefits, the patient was                     deemed in satisfactory condition to undergo the procedure.                    After obtaining informed consent, the colonoscope was                     passed under direct vision. Throughout the procedure, the                     patient's blood pressure, pulse, and oxygen saturations                     were monitored continuously. The Colonoscope was                     introduced through the anus and advanced to the the cecum,                     identified by appendiceal orifice and ileocecal valve. The                     colonoscopy was performed without difficulty. The patient                     tolerated the procedure well. The quality of the bowel                     preparation was good. Findings:      A diminutive polyp was found in the transverse colon. The polyp was       sessile. The polyp was removed with a jumbo cold forceps. Resection and       retrieval were complete.      A diminutive polyp was found at the hepatic flexure. The polyp was       sessile. The polyp was removed with a jumbo cold forceps. Resection and       retrieval were complete.      Internal hemorrhoids were found during endoscopy. The hemorrhoids were       small and  Grade I (internal hemorrhoids that do not prolapse).      The exam was otherwise without abnormality. Impression:        - One diminutive polyp in the transverse colon. Resected  and retrieved.                    - One diminutive polyp at the hepatic flexure. Resected                     and retrieved.                    - Internal hemorrhoids.                    - The examination was otherwise normal. Recommendation:    - Await pathology results. Manya Silvas, MD 06/25/2015 3:17:06 PM This report has been signed electronically. Number of Addenda: 0 Note Initiated On: 06/25/2015 2:34 PM Scope Withdrawal Time: 0 hours 15 minutes 43 seconds  Total Procedure Duration: 0 hours 24 minutes 31 seconds       [facility]

## 2015-06-25 NOTE — Anesthesia Postprocedure Evaluation (Signed)
  Anesthesia Post-op Note  Patient: [name]  Procedure(s) Performed: Procedure(s): COLONOSCOPY WITH PROPOFOL (N/A) ESOPHAGOGASTRODUODENOSCOPY (EGD) (N/A)  Anesthesia type:General  Patient location: PACU  Post pain: Pain level controlled  Post assessment: Post-op Vital signs reviewed, Patient's Cardiovascular Status Stable, Respiratory Function Stable, Patent Airway and No signs of Nausea or vomiting  Post vital signs: Reviewed and stable  Last Vitals:  Filed Vitals:   06/25/15 1520  BP: 115/72  Pulse: 69  Temp: 35.9 C  Resp: 18    Level of consciousness: awake, alert  and patient cooperative  Complications: No apparent anesthesia complications

## 2015-06-29 ENCOUNTER — Encounter: Payer: Self-pay | Admitting: Unknown Physician Specialty

## 2015-06-29 LAB — SURGICAL PATHOLOGY

## 2015-06-30 ENCOUNTER — Encounter: Payer: Self-pay | Admitting: Internal Medicine

## 2015-06-30 DIAGNOSIS — Z86010 Personal history of colonic polyps: Secondary | ICD-10-CM | POA: Insufficient documentation

## 2015-07-01 ENCOUNTER — Encounter: Payer: Self-pay | Admitting: Internal Medicine

## 2015-07-02 DIAGNOSIS — G894 Chronic pain syndrome: Secondary | ICD-10-CM | POA: Diagnosis not present

## 2015-07-02 DIAGNOSIS — Z79891 Long term (current) use of opiate analgesic: Secondary | ICD-10-CM | POA: Diagnosis not present

## 2015-07-02 DIAGNOSIS — M62830 Muscle spasm of back: Secondary | ICD-10-CM | POA: Diagnosis not present

## 2015-07-02 DIAGNOSIS — G4700 Insomnia, unspecified: Secondary | ICD-10-CM | POA: Diagnosis not present

## 2015-07-02 DIAGNOSIS — M47817 Spondylosis without myelopathy or radiculopathy, lumbosacral region: Secondary | ICD-10-CM | POA: Diagnosis not present

## 2015-07-22 ENCOUNTER — Ambulatory Visit: Admission: RE | Admit: 2015-07-22 | Payer: Medicare Other | Source: Ambulatory Visit

## 2015-07-30 ENCOUNTER — Ambulatory Visit: Payer: Medicare Other | Admitting: Psychiatry

## 2015-07-30 DIAGNOSIS — M47817 Spondylosis without myelopathy or radiculopathy, lumbosacral region: Secondary | ICD-10-CM | POA: Diagnosis not present

## 2015-07-30 DIAGNOSIS — M62830 Muscle spasm of back: Secondary | ICD-10-CM | POA: Diagnosis not present

## 2015-07-30 DIAGNOSIS — G894 Chronic pain syndrome: Secondary | ICD-10-CM | POA: Diagnosis not present

## 2015-07-30 DIAGNOSIS — G4700 Insomnia, unspecified: Secondary | ICD-10-CM | POA: Diagnosis not present

## 2015-08-03 ENCOUNTER — Other Ambulatory Visit (INDEPENDENT_AMBULATORY_CARE_PROVIDER_SITE_OTHER): Payer: Medicare Other

## 2015-08-03 DIAGNOSIS — E7800 Pure hypercholesterolemia, unspecified: Secondary | ICD-10-CM | POA: Diagnosis not present

## 2015-08-03 DIAGNOSIS — I10 Essential (primary) hypertension: Secondary | ICD-10-CM | POA: Diagnosis not present

## 2015-08-03 LAB — BASIC METABOLIC PANEL
BUN: 16 mg/dL (ref 6–23)
CO2: 27 mEq/L (ref 19–32)
Calcium: 9.2 mg/dL (ref 8.4–10.5)
Chloride: 107 mEq/L (ref 96–112)
Creatinine, Ser: 0.73 mg/dL (ref 0.40–1.20)
GFR: 87.51 mL/min (ref >60.00)
Glucose, Bld: 97 mg/dL (ref 70–99)
Potassium: 3.4 mEq/L — ABNORMAL LOW (ref 3.5–5.1)
Sodium: 142 mEq/L (ref 135–145)

## 2015-08-03 LAB — LIPID PANEL
Cholesterol: 263 mg/dL — ABNORMAL HIGH (ref 0–200)
HDL: 39.6 mg/dL (ref >39.00)
LDL Cholesterol: 187 mg/dL — ABNORMAL HIGH (ref 0–99)
NonHDL: 223.84
Total CHOL/HDL Ratio: 7
Triglycerides: 183 mg/dL — ABNORMAL HIGH (ref 0.0–149.0)
VLDL: 36.6 mg/dL (ref 0.0–40.0)

## 2015-08-03 LAB — HEPATIC FUNCTION PANEL
ALT: 24 U/L (ref 0–35)
AST: 24 U/L (ref 0–37)
Albumin: 3.8 g/dL (ref 3.5–5.2)
Alkaline Phosphatase: 101 U/L (ref 39–117)
Bilirubin, Direct: 0 mg/dL (ref 0.0–0.3)
Total Bilirubin: 0.5 mg/dL (ref 0.2–1.2)
Total Protein: 6.6 g/dL (ref 6.0–8.3)

## 2015-08-04 ENCOUNTER — Other Ambulatory Visit: Payer: Self-pay | Admitting: Internal Medicine

## 2015-08-04 ENCOUNTER — Encounter: Payer: Self-pay | Admitting: Internal Medicine

## 2015-08-04 ENCOUNTER — Ambulatory Visit
Admission: RE | Admit: 2015-08-04 | Discharge: 2015-08-04 | Disposition: A | Discharge status: Home / Self Care | Payer: Medicare Other | Source: Ambulatory Visit | Attending: Internal Medicine | Admitting: Internal Medicine

## 2015-08-04 DIAGNOSIS — Z1231 Encounter for screening mammogram for malignant neoplasm of breast: Secondary | ICD-10-CM | POA: Insufficient documentation

## 2015-08-04 DIAGNOSIS — Z1239 Encounter for other screening for malignant neoplasm of breast: Secondary | ICD-10-CM

## 2015-08-06 ENCOUNTER — Encounter: Payer: Medicare Other | Admitting: Internal Medicine

## 2015-08-06 ENCOUNTER — Encounter: Payer: Self-pay | Admitting: Internal Medicine

## 2015-08-06 DIAGNOSIS — Z0000 Encounter for general adult medical examination without abnormal findings: Secondary | ICD-10-CM | POA: Insufficient documentation

## 2015-08-07 NOTE — Telephone Encounter (Signed)
Unread mychart message mailed to patient 

## 2015-08-16 DIAGNOSIS — J189 Pneumonia, unspecified organism: Secondary | ICD-10-CM

## 2015-08-16 HISTORY — DX: Pneumonia, unspecified organism: J18.9

## 2015-08-30 ENCOUNTER — Other Ambulatory Visit: Payer: Self-pay | Admitting: Internal Medicine

## 2015-09-03 ENCOUNTER — Encounter: Payer: Self-pay | Admitting: Psychiatry

## 2015-09-03 ENCOUNTER — Ambulatory Visit (INDEPENDENT_AMBULATORY_CARE_PROVIDER_SITE_OTHER): Payer: Medicare Other | Admitting: Psychiatry

## 2015-09-03 VITALS — BP 140/88 | HR 86 | Temp 97.8°F | Ht 67.0 in | Wt 201.6 lb

## 2015-09-03 DIAGNOSIS — K589 Irritable bowel syndrome without diarrhea: Secondary | ICD-10-CM | POA: Insufficient documentation

## 2015-09-03 DIAGNOSIS — F419 Anxiety disorder, unspecified: Secondary | ICD-10-CM

## 2015-09-03 DIAGNOSIS — Z8669 Personal history of other diseases of the nervous system and sense organs: Secondary | ICD-10-CM | POA: Insufficient documentation

## 2015-09-03 DIAGNOSIS — N6019 Diffuse cystic mastopathy of unspecified breast: Secondary | ICD-10-CM | POA: Insufficient documentation

## 2015-09-03 DIAGNOSIS — Z9889 Other specified postprocedural states: Secondary | ICD-10-CM | POA: Insufficient documentation

## 2015-09-03 DIAGNOSIS — M2550 Pain in unspecified joint: Secondary | ICD-10-CM | POA: Insufficient documentation

## 2015-09-03 DIAGNOSIS — I10 Essential (primary) hypertension: Secondary | ICD-10-CM | POA: Insufficient documentation

## 2015-09-03 DIAGNOSIS — F411 Generalized anxiety disorder: Secondary | ICD-10-CM | POA: Diagnosis not present

## 2015-09-03 DIAGNOSIS — E876 Hypokalemia: Secondary | ICD-10-CM | POA: Insufficient documentation

## 2015-09-03 DIAGNOSIS — Z87898 Personal history of other specified conditions: Secondary | ICD-10-CM | POA: Insufficient documentation

## 2015-09-03 DIAGNOSIS — N809 Endometriosis, unspecified: Secondary | ICD-10-CM | POA: Insufficient documentation

## 2015-09-03 DIAGNOSIS — F331 Major depressive disorder, recurrent, moderate: Secondary | ICD-10-CM

## 2015-09-03 DIAGNOSIS — Z86010 Personal history of colonic polyps: Secondary | ICD-10-CM | POA: Insufficient documentation

## 2015-09-03 DIAGNOSIS — G4730 Sleep apnea, unspecified: Secondary | ICD-10-CM | POA: Insufficient documentation

## 2015-09-03 MED ORDER — VENLAFAXINE HCL 37.5 MG PO TABS: 300.0000 mg | ORAL_TABLET | Freq: Every day | ORAL | Status: DC

## 2015-09-03 NOTE — Progress Notes (Signed)
BH MD/PA/NP OP Progress Note  09/03/2015 3:18 PM [name]  MRN:  [id number]  Subjective:   Patient is a 55 year old female who presented for initial assessment accompanied by her husband. She was following Dr. Mamie Nick  and has not been seen the practice for almost 1 year. Asian reported that she has called her previous provider and was asking for the refill of her medications. She is currently on 375 mg of venlafaxine and Permax 50 mg by mouth twice a day. She reported that she has long history of depression and is currently stable on her medications. She is also going to [facility] on a regular basis. She reported that she has problems with her memory as she takes Valium 5 mg 3 times daily and Ambien 5 mg at bedtime to help her with the sleep. She was walking with the help of walker. Her husband is very supportive and he wants from the morning. He reported that he helps with her medications. Patient does not know about her medications as her husband fixes all her medications. She reported that she has been taking venlafaxine for long period of time and is resistant about changing her medications. However her husband was open to discussion and he agreed to a trial of decreasing the dose of venlafaxine to 300 mg. Her blood pressure is normal.. He reported that they have recently decreased the dose of her blood pressure medication. Patient is also getting pain medications from the [place] pain clinic in addition to Valium and Ambien. She reported that she is currently compliant with all the medications. She currently denied having any suicidal homicidal ideations or plans.    Chief Complaint:  Chief Complaint    Establish Care; Depression; Fatigue     Visit Diagnosis:  No diagnosis found.  Past Medical History:  Past Medical History  Diagnosis Date  . Allergic rhinitis   . Asthma   . GERD (gastroesophageal reflux disease)   . Depression   . Anxiety   . Chronic back pain   . IBS  (irritable bowel syndrome)   . Hypertension   . Sleep apnea   . Collagenous colitis     followed by Dr Tiffany Kocher  . Bronchitis, chronic ([place])   . Urine incontinence   . Migraine headache   . Ulcer disease   . Fibrocystic breast disease   . History of colon polyps   . Memory difficulty 11/15/2013  . Diplopia 11/15/2013  . Dysphagia   . Hypokalemia   . Arthralgia   . Endometriosis     Past Surgical History  Procedure Laterality Date  . Nasal septoplasty w/ turbinoplasty    . Lumbar fusion  4/05 and 8/07  . Abdominal hysterectomy      endometriosis  . Lumbar disc surgery    . Septoplasty      with reduction of turbinates  . Lumbar microdiscectomy  03/28/03    L4-5 L5  . Insertion of trial spinal cord stimulator    . Insertion of permanent spinal cord stimulator    . Removal of spinal cord stimulator    . Dilation and curettage of uterus      and hysteroscopy  . Breast biopsy Right   . Back surgery      x6  . Colonoscopy with propofol N/A 06/25/2015    Procedure: COLONOSCOPY WITH PROPOFOL;  Surgeon: Manya Silvas, MD;  Location: [facility] ENDOSCOPY;  Service: Endoscopy;  Laterality: N/A;  . Esophagogastroduodenoscopy N/A 06/25/2015  Procedure: ESOPHAGOGASTRODUODENOSCOPY (EGD);  Surgeon: Manya Silvas, MD;  Location: [facility] ENDOSCOPY;  Service: Endoscopy;  Laterality: N/A;   Family History:  Family History  Problem Relation Age of Onset  . Allergies Mother   . Hypertension Mother   . Arthritis Mother   . Hyperlipidemia Mother   . Stroke Mother   . Asthma      several family member  . Leukemia      uncle  . Hypertension Father   . Arthritis Father   . Diabetes Father   . Hypertension Brother   . Diabetes Brother   . Arthritis Maternal Grandmother   . Hyperlipidemia Maternal Grandmother   . Hyperlipidemia Maternal Grandfather   . Arthritis Paternal Grandmother   . Arthritis Paternal Grandfather   . Lymphoma Maternal Aunt     non hodgkins   Social History:   Social History   Social History  . Marital Status: Married    Spouse Name: N/A  . Number of Children: 1  . Years of Education: 58 th   Occupational History  . Disabled   .     Social History Main Topics  . Smoking status: Never Smoker   . Smokeless tobacco: Never Used  . Alcohol Use: No  . Drug Use: No  . Sexual Activity: Not Currently   Other Topics Concern  . None   Social History Narrative   Additional History:  Married x 37 years. Has a daughter and has 3 grandsons.  Was following Dr Mamie Nick for almost 3 years.    Assessment:   Musculoskeletal: Strength & Muscle Tone: within normal limits Gait & Station: unsteady Patient leans: N/A  Psychiatric Specialty Exam: HPI  ROS  Blood pressure 140/88, pulse 86, temperature 97.8 F (36.6 C), temperature source Tympanic, height 5\' 7"  (1.702 m), weight 201 lb 9.6 oz (91.445 kg), SpO2 97 %.Body mass index is 31.57 kg/(m^2).  General Appearance: Casual  Eye Contact:  Fair  Speech:  Clear and Coherent  Volume:  Decreased  Mood:  Anxious  Affect:  Congruent and Constricted  Thought Process:  Coherent  Orientation:  Full (Time, Place, and Person)  Thought Content:  WDL  Suicidal Thoughts:  No  Homicidal Thoughts:  No  Memory:  Immediate;   Fair  Judgement:  Fair  Insight:  Fair  Psychomotor Activity:  Decreased  Concentration:  Fair  Recall:  [employer] of Knowledge: Fair  Language: Fair  Akathisia:  No  Handed:  Right  AIMS (if indicated):    Assets:  Communication Skills Desire for Improvement Physical Health Social Support  ADL's:  Intact  Cognition: WNL  Sleep:  6-7   Is the patient at risk to self?  No. Has the patient been a risk to self in the past 6 months?  No. Has the patient been a risk to self within the distant past?  No. Is the patient a risk to others?  No. Has the patient been a risk to others in the past 6 months?  No. Has the patient been a risk to others within the distant past?   No.  Current Medications: Current Outpatient Prescriptions  Medication Sig Dispense Refill  . albuterol (VENTOLIN HFA) 108 (90 BASE) MCG/ACT inhaler INHALE 2 PUFFS INTO THE LUNGS EVERY 6 HOURS AS NEEDED. 6.7 g 3  . bisacodyl (DULCOLAX) 5 MG EC tablet Take 5 mg by mouth daily as needed for constipation.    . Calcium Carbonate-Vitamin D (CALCIUM-VITAMIN D) 500-200 MG-UNIT tablet  Take by mouth.    . diazepam (VALIUM) 5 MG tablet Take 5 mg by mouth 3 (three) times daily as needed.      Marland Kitchen estrogen-methylTESTOSTERone (ESTRATEST) 1.25-2.5 MG per tablet TAKE 1 TABLET BY MOUTH DAILY 30 tablet 1  . gabapentin (NEURONTIN) 600 MG tablet Take 600 mg by mouth 4 (four) times daily - after meals and at bedtime.    . metoprolol (LOPRESSOR) 50 MG tablet TAKE 1/2 TABLET BY MOUTH TWICE A DAY (NEED OFFICE VISIT) 30 tablet 2  . Multiple Vitamin (MULTIVITAMIN) capsule Take 1 capsule by mouth daily.      Marland Kitchen omeprazole (PRILOSEC OTC) 20 MG tablet Take 20 mg by mouth 2 (two) times daily.    . Oxycodone HCl 10 MG TABS Take 10 mg by mouth 3 (three) times daily as needed.  0  . OXYCONTIN 40 MG T12A 12 hr tablet Take 40 mg by mouth every 12 (twelve) hours.  0  . pantoprazole (PROTONIX) 40 MG tablet TAKE 1 TABLET BY MOUTH EVERY DAY 30 tablet 3  . pravastatin (PRAVACHOL) 10 MG tablet TAKE 1 TABLET (10 MG TOTAL) BY MOUTH DAILY. 45 tablet 7  . topiramate (TOPAMAX) 50 MG tablet TAKE 2 TABLETS BY MOUTH TWICE A DAY 120 tablet 4  . triamcinolone cream (KENALOG) 0.1 % Apply 1 application topically 2 (two) times daily. Do not use more than 7-10 days in one area 30 g 0  . venlafaxine (EFFEXOR) 37.5 MG tablet Take 37.5 mg by mouth daily.      Marland Kitchen zolpidem (AMBIEN) 10 MG tablet Take 10 mg by mouth at bedtime as needed.       No current facility-administered medications for this visit.     Medical Decision Making:  Review of Psycho-Social Stressors (1)  Treatment Plan Summary:Medication management  I reviewed all the medications  with the patient and her husband. She is getting Valium and Ambien from the [place] pain clinic. I will start her on Effexor 300 mg daily. Her husband reported that they have enough supply of the medication at this time. She has enough supply of Topamax 50 mg by mouth twice a day She will follow-up in 2 months or earlier  No previous records of the patient available in the practice   More than 50% of the time spent in psychoeducation, counseling and coordination of care.    This note was generated in part or whole with voice recognition software. Voice regonition is usually quite accurate but there are transcription errors that can and very often do occur. I apologize for any typographical errors that were not detected and corrected.     Rainey Pines, MD    09/03/2015, 3:18 PM

## 2015-09-21 DIAGNOSIS — M47817 Spondylosis without myelopathy or radiculopathy, lumbosacral region: Secondary | ICD-10-CM | POA: Diagnosis not present

## 2015-09-21 DIAGNOSIS — M62830 Muscle spasm of back: Secondary | ICD-10-CM | POA: Diagnosis not present

## 2015-09-21 DIAGNOSIS — G894 Chronic pain syndrome: Secondary | ICD-10-CM | POA: Diagnosis not present

## 2015-09-21 DIAGNOSIS — G4700 Insomnia, unspecified: Secondary | ICD-10-CM | POA: Diagnosis not present

## 2015-10-11 DIAGNOSIS — R0982 Postnasal drip: Secondary | ICD-10-CM | POA: Diagnosis not present

## 2015-10-11 DIAGNOSIS — M791 Myalgia: Secondary | ICD-10-CM | POA: Diagnosis not present

## 2015-10-11 DIAGNOSIS — J40 Bronchitis, not specified as acute or chronic: Secondary | ICD-10-CM | POA: Diagnosis not present

## 2015-10-11 DIAGNOSIS — J329 Chronic sinusitis, unspecified: Secondary | ICD-10-CM | POA: Diagnosis not present

## 2015-10-19 DIAGNOSIS — G894 Chronic pain syndrome: Secondary | ICD-10-CM | POA: Diagnosis not present

## 2015-10-19 DIAGNOSIS — Z79891 Long term (current) use of opiate analgesic: Secondary | ICD-10-CM | POA: Diagnosis not present

## 2015-10-19 DIAGNOSIS — M62830 Muscle spasm of back: Secondary | ICD-10-CM | POA: Diagnosis not present

## 2015-10-19 DIAGNOSIS — G4700 Insomnia, unspecified: Secondary | ICD-10-CM | POA: Diagnosis not present

## 2015-10-19 DIAGNOSIS — M47817 Spondylosis without myelopathy or radiculopathy, lumbosacral region: Secondary | ICD-10-CM | POA: Diagnosis not present

## 2015-10-23 ENCOUNTER — Encounter: Payer: Medicare Other | Admitting: Internal Medicine

## 2015-10-23 ENCOUNTER — Encounter (INDEPENDENT_AMBULATORY_CARE_PROVIDER_SITE_OTHER): Payer: Self-pay

## 2015-10-29 ENCOUNTER — Ambulatory Visit: Payer: Medicare Other | Admitting: Psychiatry

## 2015-11-02 DIAGNOSIS — R0982 Postnasal drip: Secondary | ICD-10-CM | POA: Diagnosis not present

## 2015-11-02 DIAGNOSIS — J0110 Acute frontal sinusitis, unspecified: Secondary | ICD-10-CM | POA: Diagnosis not present

## 2015-11-16 ENCOUNTER — Emergency Department: Payer: Medicare Other

## 2015-11-16 ENCOUNTER — Emergency Department
Admission: EM | Admit: 2015-11-16 | Discharge: 2015-11-16 | Disposition: A | Discharge status: Home / Self Care | Payer: Medicare Other | Attending: Emergency Medicine | Admitting: Emergency Medicine

## 2015-11-16 DIAGNOSIS — G4700 Insomnia, unspecified: Secondary | ICD-10-CM | POA: Diagnosis not present

## 2015-11-16 DIAGNOSIS — J45909 Unspecified asthma, uncomplicated: Secondary | ICD-10-CM | POA: Insufficient documentation

## 2015-11-16 DIAGNOSIS — I10 Essential (primary) hypertension: Secondary | ICD-10-CM | POA: Insufficient documentation

## 2015-11-16 DIAGNOSIS — F329 Major depressive disorder, single episode, unspecified: Secondary | ICD-10-CM | POA: Diagnosis not present

## 2015-11-16 DIAGNOSIS — J160 Chlamydial pneumonia: Secondary | ICD-10-CM

## 2015-11-16 DIAGNOSIS — J189 Pneumonia, unspecified organism: Secondary | ICD-10-CM | POA: Diagnosis not present

## 2015-11-16 DIAGNOSIS — G894 Chronic pain syndrome: Secondary | ICD-10-CM | POA: Diagnosis not present

## 2015-11-16 DIAGNOSIS — R05 Cough: Secondary | ICD-10-CM | POA: Diagnosis not present

## 2015-11-16 DIAGNOSIS — R062 Wheezing: Secondary | ICD-10-CM | POA: Diagnosis present

## 2015-11-16 DIAGNOSIS — Z79899 Other long term (current) drug therapy: Secondary | ICD-10-CM | POA: Diagnosis not present

## 2015-11-16 DIAGNOSIS — J168 Pneumonia due to other specified infectious organisms: Secondary | ICD-10-CM | POA: Diagnosis not present

## 2015-11-16 DIAGNOSIS — M47817 Spondylosis without myelopathy or radiculopathy, lumbosacral region: Secondary | ICD-10-CM | POA: Diagnosis not present

## 2015-11-16 DIAGNOSIS — M62830 Muscle spasm of back: Secondary | ICD-10-CM | POA: Diagnosis not present

## 2015-11-16 LAB — CBC
HCT: 37.8 % (ref 35.0–47.0)
Hemoglobin: 12.6 g/dL (ref 12.0–16.0)
MCH: 29.3 pg (ref 26.0–34.0)
MCHC: 33.3 g/dL (ref 32.0–36.0)
MCV: 88 fL (ref 80.0–100.0)
Platelets: 420 10*3/uL (ref 150–440)
RBC: 4.29 MIL/uL (ref 3.80–5.20)
RDW: 13 % (ref 11.5–14.5)
WBC: 9.6 10*3/uL (ref 3.6–11.0)

## 2015-11-16 LAB — BASIC METABOLIC PANEL
Anion gap: 4 — ABNORMAL LOW (ref 5–15)
BUN: 11 mg/dL (ref 6–20)
CO2: 24 mmol/L (ref 22–32)
Calcium: 8.9 mg/dL (ref 8.9–10.3)
Chloride: 109 mmol/L (ref 101–111)
Creatinine, Ser: 0.79 mg/dL (ref 0.44–1.00)
GFR calc Af Amer: >60 mL/min (ref >60)
GFR calc non Af Amer: >60 mL/min (ref >60)
Glucose, Bld: 84 mg/dL (ref 65–99)
Potassium: 3.8 mmol/L (ref 3.5–5.1)
Sodium: 137 mmol/L (ref 135–145)

## 2015-11-16 MED ORDER — ALBUTEROL SULFATE (2.5 MG/3ML) 0.083% IN NEBU
INHALATION_SOLUTION | RESPIRATORY_TRACT | Status: AC
  Filled 2015-11-16: qty 6

## 2015-11-16 MED ORDER — AZITHROMYCIN 500 MG PO TABS
500.0000 mg | ORAL_TABLET | Freq: Once | ORAL | Status: AC
  Administered 2015-11-16: 500 mg via ORAL
  Filled 2015-11-16: qty 1

## 2015-11-16 MED ORDER — ALBUTEROL SULFATE (2.5 MG/3ML) 0.083% IN NEBU
5.0000 mg | INHALATION_SOLUTION | Freq: Once | RESPIRATORY_TRACT | Status: AC
  Administered 2015-11-16: 5 mg via RESPIRATORY_TRACT

## 2015-11-16 MED ORDER — METHYLPREDNISOLONE SODIUM SUCC 125 MG IJ SOLR
125.0000 mg | Freq: Once | INTRAMUSCULAR | Status: AC
  Administered 2015-11-16: 125 mg via INTRAVENOUS
  Filled 2015-11-16: qty 2

## 2015-11-16 MED ORDER — SODIUM CHLORIDE 0.9 % IV BOLUS (SEPSIS)
500.0000 mL | Freq: Once | INTRAVENOUS | Status: AC
  Administered 2015-11-16: 500 mL via INTRAVENOUS

## 2015-11-16 MED ORDER — PREDNISONE 10 MG (21) PO TBPK: ORAL_TABLET | ORAL | Status: DC

## 2015-11-16 MED ORDER — AZITHROMYCIN 250 MG PO TABS: ORAL_TABLET | ORAL | Status: DC

## 2015-11-16 MED ORDER — IPRATROPIUM-ALBUTEROL 0.5-2.5 (3) MG/3ML IN SOLN
3.0000 mL | Freq: Once | RESPIRATORY_TRACT | Status: AC
  Administered 2015-11-16: 3 mL via RESPIRATORY_TRACT
  Filled 2015-11-16: qty 3

## 2015-11-16 MED ORDER — DEXTROSE 5 % IV SOLN
1.0000 g | Freq: Once | INTRAVENOUS | Status: AC
  Administered 2015-11-16: 1 g via INTRAVENOUS
  Filled 2015-11-16: qty 10

## 2015-11-16 MED ORDER — GUAIFENESIN-CODEINE 100-10 MG/5ML PO SYRP: 5.0000 mL | ORAL_SOLUTION | Freq: Three times a day (TID) | ORAL | Status: DC | PRN

## 2015-11-16 NOTE — Discharge Instructions (Signed)
Please follow up with your primary care provider. You will need to have a repeat chest x-ray in about a month to make sure the infection has completely cleared. Return to the ER for symptoms that change or worsen if you are unable to schedule an appointment.  Community-Acquired Pneumonia, Adult Pneumonia is an infection of the lungs. There are different types of pneumonia. One type can develop while a person is in a hospital. A different type, called community-acquired pneumonia, develops in people who are not, or have not recently been, in the hospital or other health care facility.  CAUSES Pneumonia may be caused by bacteria, viruses, or funguses. Community-acquired pneumonia is often caused by Streptococcus pneumonia bacteria. These bacteria are often passed from one person to another by breathing in droplets from the cough or sneeze of an infected person. RISK FACTORS The condition is more likely to develop in:  People who havechronic diseases, such as chronic obstructive pulmonary disease (COPD), asthma, congestive heart failure, cystic fibrosis, diabetes, or kidney disease.  People who haveearly-stage or late-stage HIV.  People who havesickle cell disease.  People who havehad their spleen removed (splenectomy).  People who havepoor Human resources officer.  People who havemedical conditions that increase the risk of breathing in (aspirating) secretions their own mouth and nose.   People who havea weakened immune system (immunocompromised).  People who smoke.  People whotravel to areas where pneumonia-causing germs commonly exist.  People whoare around animal habitats or animals that have pneumonia-causing germs, including birds, bats, rabbits, cats, and farm animals. SYMPTOMS Symptoms of this condition include:  Adry cough.  A wet (productive) cough.  Fever.  Sweating.  Chest pain, especially when breathing deeply or coughing.  Rapid breathing or difficulty  breathing.  Shortness of breath.  Shaking chills.  Fatigue.  Muscle aches. DIAGNOSIS Your health care provider will take a medical history and perform a physical exam. You may also have other tests, including:  Imaging studies of your chest, including X-rays.  Tests to check your blood oxygen level and other blood gases.  Other tests on blood, mucus (sputum), fluid around your lungs (pleural fluid), and urine. If your pneumonia is severe, other tests may be done to identify the specific cause of your illness. TREATMENT The type of treatment that you receive depends on many factors, such as the cause of your pneumonia, the medicines you take, and other medical conditions that you have. For most adults, treatment and recovery from pneumonia may occur at home. In some cases, treatment must happen in a hospital. Treatment may include:  Antibiotic medicines, if the pneumonia was caused by bacteria.  Antiviral medicines, if the pneumonia was caused by a virus.  Medicines that are given by mouth or through an IV tube.  Oxygen.  Respiratory therapy. Although rare, treating severe pneumonia may include:  Mechanical ventilation. This is done if you are not breathing well on your own and you cannot maintain a safe blood oxygen level.  Thoracentesis. This procedureremoves fluid around one lung or both lungs to help you breathe better. HOME CARE INSTRUCTIONS  Take over-the-counter and prescription medicines only as told by your health care provider.  Only takecough medicine if you are losing sleep. Understand that cough medicine can prevent your body's natural ability to remove mucus from your lungs.  If you were prescribed an antibiotic medicine, take it as told by your health care provider. Do not stop taking the antibiotic even if you start to feel better.  Sleep in a  semi-upright position at night. Try sleeping in a reclining chair, or place a few pillows under your head.  Do  not use tobacco products, including cigarettes, chewing tobacco, and e-cigarettes. If you need help quitting, ask your health care provider.  Drink enough water to keep your urine clear or pale yellow. This will help to thin out mucus secretions in your lungs. PREVENTION There are ways that you can decrease your risk of developing community-acquired pneumonia. Consider getting a pneumococcal vaccine if:  You are older than 57 years of age.  You are older than 57 years of age and are undergoing cancer treatment, have chronic lung disease, or have other medical conditions that affect your immune system. Ask your health care provider if this applies to you. There are different types and schedules of pneumococcal vaccines. Ask your health care provider which vaccination option is best for you. You may also prevent community-acquired pneumonia if you take these actions:  Get an influenza vaccine every year. Ask your health care provider which type of influenza vaccine is best for you.  Go to the dentist on a regular basis.  Wash your hands often. Use hand sanitizer if soap and water are not available. SEEK MEDICAL CARE IF:  You have a fever.  You are losing sleep because you cannot control your cough with cough medicine. SEEK IMMEDIATE MEDICAL CARE IF:  You have worsening shortness of breath.  You have increased chest pain.  Your sickness becomes worse, especially if you are an older adult or have a weakened immune system.  You cough up blood.   This information is not intended to replace advice given to you by your health care provider. Make sure you discuss any questions you have with your health care provider.   Document Released: 08/01/2005 Document Revised: 04/22/2015 Document Reviewed: 11/26/2014 Elsevier Interactive Patient Education [employer].

## 2015-11-16 NOTE — ED Notes (Signed)
Green productive sputum

## 2015-11-16 NOTE — ED Provider Notes (Signed)
CSN: [id number]     Arrival date & time 11/16/15  1805 History   First MD Initiated Contact with Patient 11/16/15 2053     Chief Complaint  Patient presents with  . Cough  . Wheezing     HPI   55 year old female who comes to the emergency department for evaluation of cough and wheezing for the past 6 weeks. She states that her symptoms started about 6 weeks ago with a sinus infection and bilateral ear infection. She was treated with ciprofloxacin and steroid at that time. She states that the symptoms resolved for a short period and then returned. She again went to urgent care and was treated with amoxicillin. That was a couple weeks ago. She reports that the wheezing has gotten so loud that it can be heard across the room. She states that her cough is very harsh and the mucus is very thick and hard to cough up. She states that she just has general weakness and overall feels poorly. She was unable to get in with her primary care provider until May and new that she would not be able to wait that long so she decided to come to the emergency room tonight for evaluation.  Past Medical History  Diagnosis Date  . Allergic rhinitis   . Asthma   . GERD (gastroesophageal reflux disease)   . Depression   . Anxiety   . Chronic back pain   . IBS (irritable bowel syndrome)   . Hypertension   . Sleep apnea   . Collagenous colitis     followed by Dr Tiffany Kocher  . Bronchitis, chronic ([place])   . Urine incontinence   . Migraine headache   . Ulcer disease   . Fibrocystic breast disease   . History of colon polyps   . Memory difficulty 11/15/2013  . Diplopia 11/15/2013  . Dysphagia   . Hypokalemia   . Arthralgia   . Endometriosis    Past Surgical History  Procedure Laterality Date  . Nasal septoplasty w/ turbinoplasty    . Lumbar fusion  4/05 and 8/07  . Abdominal hysterectomy      endometriosis  . Lumbar disc surgery    . Septoplasty      with reduction of turbinates  . Lumbar microdiscectomy   03/28/03    L4-5 L5  . Insertion of trial spinal cord stimulator    . Insertion of permanent spinal cord stimulator    . Removal of spinal cord stimulator    . Dilation and curettage of uterus      and hysteroscopy  . Breast biopsy Right   . Back surgery      x6  . Colonoscopy with propofol N/A 06/25/2015    Procedure: COLONOSCOPY WITH PROPOFOL;  Surgeon: Manya Silvas, MD;  Location: [facility] ENDOSCOPY;  Service: Endoscopy;  Laterality: N/A;  . Esophagogastroduodenoscopy N/A 06/25/2015    Procedure: ESOPHAGOGASTRODUODENOSCOPY (EGD);  Surgeon: Manya Silvas, MD;  Location: [facility] ENDOSCOPY;  Service: Endoscopy;  Laterality: N/A;   Family History  Problem Relation Age of Onset  . Allergies Mother   . Hypertension Mother   . Arthritis Mother   . Hyperlipidemia Mother   . Stroke Mother   . Asthma      several family member  . Leukemia      uncle  . Hypertension Father   . Arthritis Father   . Diabetes Father   . Hypertension Brother   . Diabetes Brother   . Arthritis Maternal Grandmother   .  Hyperlipidemia Maternal Grandmother   . Hyperlipidemia Maternal Grandfather   . Arthritis Paternal Grandmother   . Arthritis Paternal Grandfather   . Lymphoma Maternal Aunt     non hodgkins   Social History  Substance Use Topics  . Smoking status: Never Smoker   . Smokeless tobacco: Never Used  . Alcohol Use: No   OB History    No data available     Review of Systems  Constitutional: Negative for fever and chills.  HENT: Negative for ear pain.   Respiratory: Positive for cough, shortness of breath and wheezing.   Cardiovascular: Negative for leg swelling.  Gastrointestinal: Negative for nausea and vomiting.  Musculoskeletal: Negative for back pain.  Skin: Negative.   Neurological: Negative for dizziness.      Allergies  Buspirone; Buspirone hcl; Ciprofloxacin; Conjugated estrogens; Cymbalta; Doxycycline; Duloxetine; Erythromycin ethylsuccinate; Estradiol; Fentanyl;  Metronidazole; Mirtazapine; Montelukast; Montelukast sodium; Montelukast sodium; Oxymorphone; Sumatriptan; and Sumatriptan succinate  Home Medications   Prior to Admission medications   Medication Sig Start Date End Date Taking? Authorizing Provider  albuterol (VENTOLIN HFA) 108 (90 BASE) MCG/ACT inhaler INHALE 2 PUFFS INTO THE LUNGS EVERY 6 HOURS AS NEEDED. 02/26/14   Einar Pheasant, MD  azithromycin (ZITHROMAX) 250 MG tablet 2 tablets today, then 1 tablet for the next 4 days. 11/16/15   Victorino Dike, FNP  bisacodyl (DULCOLAX) 5 MG EC tablet Take 5 mg by mouth daily as needed for constipation.    Historical Provider, MD  Calcium Carbonate-Vitamin D (CALCIUM-VITAMIN D) 500-200 MG-UNIT tablet Take by mouth.    Historical Provider, MD  diazepam (VALIUM) 5 MG tablet Take 5 mg by mouth 3 (three) times daily as needed.      Historical Provider, MD  gabapentin (NEURONTIN) 600 MG tablet Take 600 mg by mouth 4 (four) times daily - after meals and at bedtime. 11/02/13   Historical Provider, MD  guaiFENesin-codeine (ROBITUSSIN AC) 100-10 MG/5ML syrup Take 5 mLs by mouth 3 (three) times daily as needed for cough. 11/16/15   Victorino Dike, FNP  metoprolol (LOPRESSOR) 50 MG tablet TAKE 1/2 TABLET BY MOUTH TWICE A DAY (NEED OFFICE VISIT) 06/17/15   Einar Pheasant, MD  Multiple Vitamin (MULTIVITAMIN) capsule Take 1 capsule by mouth daily.      Historical Provider, MD  Oxycodone HCl 10 MG TABS Take 10 mg by mouth 3 (three) times daily as needed. 04/02/15   Historical Provider, MD  OXYCONTIN 40 MG T12A 12 hr tablet Take 40 mg by mouth every 12 (twelve) hours. 04/02/15   Historical Provider, MD  pravastatin (PRAVACHOL) 10 MG tablet TAKE 1 TABLET (10 MG TOTAL) BY MOUTH DAILY. 05/30/15   Einar Pheasant, MD  predniSONE (STERAPRED UNI-PAK 21 TAB) 10 MG (21) TBPK tablet Take 6 tablets on day 1 Take 5 tablets on day 2 Take 4 tablets on day 3 Take 3 tablets on day 4 Take 2 tablets on day 5 Take 1 tablet on day 6 11/16/15    Kurtiss Wence B Lonald Troiani, FNP  topiramate (TOPAMAX) 50 MG tablet TAKE 2 TABLETS BY MOUTH TWICE A DAY 06/14/15   Jolanta B Pucilowska, MD  triamcinolone cream (KENALOG) 0.1 % Apply 1 application topically 2 (two) times daily. Do not use more than 7-10 days in one area 05/04/15   Einar Pheasant, MD  venlafaxine (EFFEXOR) 37.5 MG tablet Take 8 tablets (300 mg total) by mouth daily. Pt has supply 09/03/15   Rainey Pines, MD  zolpidem (AMBIEN) 10 MG tablet Take 10 mg  by mouth at bedtime as needed.      Historical Provider, MD   BP 108/67 mmHg  Pulse 84  Temp(Src) 98.6 F (37 C) (Oral)  Resp 18  Ht 5\' 4"  (1.626 m)  Wt 90.719 kg  BMI 34.31 kg/m2  SpO2 96% Physical Exam  Constitutional: She is oriented to person, place, and time. She appears well-developed and well-nourished.  HENT:  Head: Atraumatic.  Eyes: EOM are normal.  Neck: Normal range of motion.  Cardiovascular: Normal rate.   Pulmonary/Chest: Effort normal. No respiratory distress. She has wheezes in the right upper field, the right middle field, the right lower field, the left upper field, the left middle field and the left lower field. She has rhonchi in the left upper field.  Abdominal: Soft.  Musculoskeletal: Normal range of motion.  Lymphadenopathy:    She has no cervical adenopathy.  Neurological: She is alert and oriented to person, place, and time.  Skin: Skin is warm and dry.  Psychiatric: Her behavior is normal. Thought content normal.  Vitals reviewed.   ED Course  Procedures (including critical care time) Labs Review Labs Reviewed  BASIC METABOLIC PANEL - Abnormal; Notable for the following:    Anion gap 4 (*)    All other components within normal limits  CBC    Imaging Review Dg Chest 2 View  11/16/2015  CLINICAL DATA:  Cough and wheezing for 6 weeks EXAM: CHEST  2 VIEW COMPARISON:  March 14, 2011 FINDINGS: There is patchy infiltrate in the left upper lobe. Lungs elsewhere clear. Heart size and pulmonary vascularity are  normal. No adenopathy. There is degenerative change in the thoracic spine. Spinal stimulator tip is in the mid thoracic region. IMPRESSION: Patchy infiltrate left upper lobe consistent with pneumonia. Lungs elsewhere clear. Followup PA and lateral chest radiographs recommended in 3-4 weeks following trial of antibiotic therapy to ensure resolution and exclude underlying malignancy. Electronically Signed   By: Lowella Grip III M.D.   On: 11/16/2015 19:17   I have personally reviewed and evaluated these images and lab results as part of my medical decision-making.   EKG Interpretation None      MDM   Final diagnoses:  Pneumonia of left upper lobe due to Chlamydia species    Patient was given a DuoNeb treatment, IV Solu-Medrol, and IV Rocephin while in the emergency department. She was given 500 mg of azithromycin by mouth. She had significant relief of wheezing with the medications. She is to be discharged home with a prescription for azithromycin and prednisone. She reports that she does have albuterol inhaler at home with refills available. She was encouraged to call and see if she can schedule an appointment with her primary care provider sooner then mid May. She was advised that she should return to the emergency department for symptoms that are not improving or change or worsen if unable to schedule an appointment with primary care. She was also advised that her primary care provider will need to repeat a chest x-ray in 3-4 weeks to make sure that the pneumonia has cleared.    Victorino Dike, FNP 11/16/15 [id number]  Nance Pear, MD 11/16/15 [id number]

## 2015-11-16 NOTE — ED Notes (Addendum)
Pt arrives to ER c/o cough and wheezing X 6 weeks. Told to come to ER, pt has been to [facility] 3 times. Pt alert and oriented X4, active, cooperative, pt in NAD. RR even and unlabored, color WNL.

## 2015-12-14 DIAGNOSIS — M47817 Spondylosis without myelopathy or radiculopathy, lumbosacral region: Secondary | ICD-10-CM | POA: Diagnosis not present

## 2015-12-14 DIAGNOSIS — M62830 Muscle spasm of back: Secondary | ICD-10-CM | POA: Diagnosis not present

## 2015-12-14 DIAGNOSIS — G4700 Insomnia, unspecified: Secondary | ICD-10-CM | POA: Diagnosis not present

## 2015-12-14 DIAGNOSIS — G894 Chronic pain syndrome: Secondary | ICD-10-CM | POA: Diagnosis not present

## 2015-12-20 ENCOUNTER — Other Ambulatory Visit: Payer: Self-pay | Admitting: Internal Medicine

## 2015-12-21 DIAGNOSIS — G5601 Carpal tunnel syndrome, right upper limb: Secondary | ICD-10-CM | POA: Diagnosis not present

## 2015-12-21 DIAGNOSIS — M5412 Radiculopathy, cervical region: Secondary | ICD-10-CM | POA: Diagnosis not present

## 2015-12-21 DIAGNOSIS — J45909 Unspecified asthma, uncomplicated: Secondary | ICD-10-CM | POA: Diagnosis not present

## 2015-12-21 DIAGNOSIS — J069 Acute upper respiratory infection, unspecified: Secondary | ICD-10-CM | POA: Diagnosis not present

## 2015-12-21 DIAGNOSIS — R062 Wheezing: Secondary | ICD-10-CM | POA: Diagnosis not present

## 2016-01-13 DIAGNOSIS — M62830 Muscle spasm of back: Secondary | ICD-10-CM | POA: Diagnosis not present

## 2016-01-13 DIAGNOSIS — G894 Chronic pain syndrome: Secondary | ICD-10-CM | POA: Diagnosis not present

## 2016-01-13 DIAGNOSIS — G4700 Insomnia, unspecified: Secondary | ICD-10-CM | POA: Diagnosis not present

## 2016-01-13 DIAGNOSIS — M47817 Spondylosis without myelopathy or radiculopathy, lumbosacral region: Secondary | ICD-10-CM | POA: Diagnosis not present

## 2016-01-14 ENCOUNTER — Ambulatory Visit
Admission: RE | Admit: 2016-01-14 | Discharge: 2016-01-14 | Disposition: A | Discharge status: Home / Self Care | Payer: Medicare Other | Source: Ambulatory Visit | Attending: Internal Medicine | Admitting: Internal Medicine

## 2016-01-14 ENCOUNTER — Encounter: Payer: Self-pay | Admitting: Internal Medicine

## 2016-01-14 ENCOUNTER — Ambulatory Visit (INDEPENDENT_AMBULATORY_CARE_PROVIDER_SITE_OTHER): Payer: Medicare Other | Admitting: Internal Medicine

## 2016-01-14 VITALS — BP 120/80 | HR 93 | Temp 98.4°F | Resp 18 | Ht 67.0 in | Wt 190.5 lb

## 2016-01-14 DIAGNOSIS — R062 Wheezing: Secondary | ICD-10-CM

## 2016-01-14 DIAGNOSIS — J189 Pneumonia, unspecified organism: Secondary | ICD-10-CM | POA: Diagnosis not present

## 2016-01-14 DIAGNOSIS — J45909 Unspecified asthma, uncomplicated: Secondary | ICD-10-CM | POA: Diagnosis not present

## 2016-01-14 DIAGNOSIS — I10 Essential (primary) hypertension: Secondary | ICD-10-CM

## 2016-01-14 DIAGNOSIS — J4520 Mild intermittent asthma, uncomplicated: Secondary | ICD-10-CM

## 2016-01-14 DIAGNOSIS — F32A Depression, unspecified: Secondary | ICD-10-CM

## 2016-01-14 DIAGNOSIS — R918 Other nonspecific abnormal finding of lung field: Secondary | ICD-10-CM | POA: Diagnosis not present

## 2016-01-14 DIAGNOSIS — R0602 Shortness of breath: Secondary | ICD-10-CM | POA: Diagnosis not present

## 2016-01-14 DIAGNOSIS — K219 Gastro-esophageal reflux disease without esophagitis: Secondary | ICD-10-CM

## 2016-01-14 DIAGNOSIS — F419 Anxiety disorder, unspecified: Secondary | ICD-10-CM

## 2016-01-14 DIAGNOSIS — J45901 Unspecified asthma with (acute) exacerbation: Secondary | ICD-10-CM

## 2016-01-14 DIAGNOSIS — M545 Low back pain: Secondary | ICD-10-CM

## 2016-01-14 DIAGNOSIS — F329 Major depressive disorder, single episode, unspecified: Secondary | ICD-10-CM

## 2016-01-14 DIAGNOSIS — R1310 Dysphagia, unspecified: Secondary | ICD-10-CM

## 2016-01-14 DIAGNOSIS — E7800 Pure hypercholesterolemia, unspecified: Secondary | ICD-10-CM

## 2016-01-14 MED ORDER — PREDNISONE 10 MG PO TABS: ORAL_TABLET | ORAL | Status: DC

## 2016-01-14 MED ORDER — ALBUTEROL SULFATE (2.5 MG/3ML) 0.083% IN NEBU
2.5000 mg | INHALATION_SOLUTION | Freq: Once | RESPIRATORY_TRACT | Status: AC
  Administered 2016-01-14: 2.5 mg via RESPIRATORY_TRACT

## 2016-01-14 MED ORDER — PANTOPRAZOLE SODIUM 40 MG PO TBEC: 40.0000 mg | DELAYED_RELEASE_TABLET | Freq: Two times a day (BID) | ORAL | Status: DC

## 2016-01-14 NOTE — Progress Notes (Signed)
Patient ID: [name], female   DOB: 1960-09-22, 55 y.o.   MRN: [id number]   Subjective:    Patient ID: [name], female    DOB: August 16, 1960, 55 y.o.   MRN: [id number]  HPI  Patient here for a scheduled physical.  Given her issues, we decided to postpone the physical.  She reports increased cough and congestion.  Wheezing.  Some sob.  Was recently seen in ER.  CXR with pneumonia.  Treated.  Symptoms improved initially, but now worsening.  Also reports some dysphagia.  Still with trouble swallowing.  Previously saw GI.  Taking PPI.  No abdominal pain or cramping.  Seeing psychiatry.  On effexor.  Had dose increased recently.  Not active.     Past Medical History  Diagnosis Date  . Allergic rhinitis   . Asthma   . GERD (gastroesophageal reflux disease)   . Depression   . Anxiety   . Chronic back pain   . IBS (irritable bowel syndrome)   . Hypertension   . Sleep apnea   . Collagenous colitis     followed by Dr Tiffany Kocher  . Bronchitis, chronic ([place])   . Urine incontinence   . Migraine headache   . Ulcer disease   . Fibrocystic breast disease   . History of colon polyps   . Memory difficulty 11/15/2013  . Diplopia 11/15/2013  . Dysphagia   . Hypokalemia   . Arthralgia   . Endometriosis    Past Surgical History  Procedure Laterality Date  . Nasal septoplasty w/ turbinoplasty    . Lumbar fusion  4/05 and 8/07  . Abdominal hysterectomy      endometriosis  . Lumbar disc surgery    . Septoplasty      with reduction of turbinates  . Lumbar microdiscectomy  03/28/03    L4-5 L5  . Insertion of trial spinal cord stimulator    . Insertion of permanent spinal cord stimulator    . Removal of spinal cord stimulator    . Dilation and curettage of uterus      and hysteroscopy  . Breast biopsy Right   . Back surgery      x6  . Colonoscopy with propofol N/A 06/25/2015    Procedure: COLONOSCOPY WITH PROPOFOL;  Surgeon: Manya Silvas, MD;  Location: [facility] ENDOSCOPY;  Service:  Endoscopy;  Laterality: N/A;  . Esophagogastroduodenoscopy N/A 06/25/2015    Procedure: ESOPHAGOGASTRODUODENOSCOPY (EGD);  Surgeon: Manya Silvas, MD;  Location: [facility] ENDOSCOPY;  Service: Endoscopy;  Laterality: N/A;   Family History  Problem Relation Age of Onset  . Allergies Mother   . Hypertension Mother   . Arthritis Mother   . Hyperlipidemia Mother   . Stroke Mother   . Asthma      several family member  . Leukemia      uncle  . Hypertension Father   . Arthritis Father   . Diabetes Father   . Hypertension Brother   . Diabetes Brother   . Arthritis Maternal Grandmother   . Hyperlipidemia Maternal Grandmother   . Hyperlipidemia Maternal Grandfather   . Arthritis Paternal Grandmother   . Arthritis Paternal Grandfather   . Lymphoma Maternal Aunt     non hodgkins   Social History   Social History  . Marital Status: Married    Spouse Name: N/A  . Number of Children: 1  . Years of Education: 32 th   Occupational History  . Disabled   .  Social History Main Topics  . Smoking status: Never Smoker   . Smokeless tobacco: Never Used  . Alcohol Use: No  . Drug Use: No  . Sexual Activity: Not Currently   Other Topics Concern  . None   Social History Narrative    Outpatient Encounter Prescriptions as of 01/14/2016  Medication Sig  . Calcium Carbonate-Vitamin D (CALCIUM-VITAMIN D) 500-200 MG-UNIT tablet Take by mouth.  . diazepam (VALIUM) 5 MG tablet Take 5 mg by mouth 3 (three) times daily as needed.    . gabapentin (NEURONTIN) 800 MG tablet Take 800 mg by mouth 4 (four) times daily.  . metoprolol (LOPRESSOR) 50 MG tablet TAKE 1/2 TABLET BY MOUTH TWICE A DAY (NEED OFFICE VISIT)  . Multiple Vitamin (MULTIVITAMIN) capsule Take 1 capsule by mouth daily.    . Oxycodone HCl 10 MG TABS Take 10 mg by mouth 3 (three) times daily as needed.  . pantoprazole (PROTONIX) 40 MG tablet Take 1 tablet (40 mg total) by mouth 2 (two) times daily before a meal.  . pravastatin  (PRAVACHOL) 10 MG tablet TAKE 1 TABLET (10 MG TOTAL) BY MOUTH DAILY.  Marland Kitchen RELISTOR 150 MG TABS TAKE 3 TABLETS BY MOUTH ONCE A DAY  . SYMBICORT 80-4.5 MCG/ACT inhaler INHALE 2 PUFFS BY MOUTH TWICE A DAY IN THE MORNING AND EVENING  . topiramate (TOPAMAX) 50 MG tablet TAKE 2 TABLETS BY MOUTH TWICE A DAY  . triamcinolone cream (KENALOG) 0.1 % Apply 1 application topically 2 (two) times daily. Do not use more than 7-10 days in one area  . venlafaxine XR (EFFEXOR-XR) 150 MG 24 hr capsule Take 150 mg by mouth 2 (two) times daily.  Marland Kitchen venlafaxine XR (EFFEXOR-XR) 75 MG 24 hr capsule Take 75 mg by mouth every morning.  Marland Kitchen XTAMPZA ER 36 MG C12A TAKE 1 CAPSULE BY MOUTH EVERY 12 HOURS  . zolpidem (AMBIEN) 10 MG tablet Take 10 mg by mouth at bedtime as needed.    . [DISCONTINUED] bisacodyl (DULCOLAX) 5 MG EC tablet Take 5 mg by mouth daily as needed for constipation.  . [DISCONTINUED] pantoprazole (PROTONIX) 40 MG tablet TAKE 1 TABLET BY MOUTH EVERY DAY  . nystatin (NYSTATIN) powder Apply topically 2 (two) times daily.  Marland Kitchen nystatin cream (MYCOSTATIN) Apply 1 application topically 2 (two) times daily.  . predniSONE (DELTASONE) 10 MG tablet Take 6 tablets x 1 day and then decrease by 1/2 tablet per day until down to zero mg.  . [DISCONTINUED] albuterol (VENTOLIN HFA) 108 (90 BASE) MCG/ACT inhaler INHALE 2 PUFFS INTO THE LUNGS EVERY 6 HOURS AS NEEDED.  . [DISCONTINUED] azithromycin (ZITHROMAX) 250 MG tablet 2 tablets today, then 1 tablet for the next 4 days. (Patient not taking: Reported on 01/14/2016)  . [DISCONTINUED] gabapentin (NEURONTIN) 600 MG tablet Take 800 mg by mouth 4 (four) times daily - after meals and at bedtime.   . [DISCONTINUED] guaiFENesin-codeine (ROBITUSSIN AC) 100-10 MG/5ML syrup Take 5 mLs by mouth 3 (three) times daily as needed for cough.  . [DISCONTINUED] OXYCONTIN 40 MG T12A 12 hr tablet Take 40 mg by mouth every 12 (twelve) hours.  . [DISCONTINUED] predniSONE (STERAPRED UNI-PAK 21 TAB) 10 MG  (21) TBPK tablet Take 6 tablets on day 1 Take 5 tablets on day 2 Take 4 tablets on day 3 Take 3 tablets on day 4 Take 2 tablets on day 5 Take 1 tablet on day 6 (Patient not taking: Reported on 01/14/2016)  . [DISCONTINUED] venlafaxine (EFFEXOR) 37.5 MG tablet Take 8  tablets (300 mg total) by mouth daily. Pt has supply (Patient not taking: Reported on 01/14/2016)  . [EXPIRED] albuterol (PROVENTIL) (2.5 MG/3ML) 0.083% nebulizer solution 2.5 mg    No facility-administered encounter medications on file as of 01/14/2016.    Review of Systems  Constitutional: Negative for fever and unexpected weight change.  HENT: Positive for congestion. Negative for sinus pressure.   Respiratory: Positive for cough, shortness of breath and wheezing. Negative for chest tightness.   Cardiovascular: Negative for chest pain, palpitations and leg swelling.  Gastrointestinal: Negative for nausea, vomiting and abdominal pain.  Genitourinary: Negative for dysuria and difficulty urinating.  Musculoskeletal: Negative for myalgias and joint swelling.  Skin: Negative for color change and rash.  Neurological: Negative for dizziness, light-headedness and headaches.  Psychiatric/Behavioral: Negative for dysphoric mood and agitation.       Objective:    Physical Exam  Constitutional: She appears well-developed and well-nourished. No distress.  HENT:  Nose: Nose normal.  Mouth/Throat: Oropharynx is clear and moist.  Neck: Neck supple. No thyromegaly present.  Cardiovascular: Normal rate and regular rhythm.   Pulmonary/Chest: Breath sounds normal. No respiratory distress.  Increased congestion and wheezing.  Increased cough with forced expiration.    Abdominal: Soft. Bowel sounds are normal. There is no tenderness.  Musculoskeletal: She exhibits no edema or tenderness.  Lymphadenopathy:    She has no cervical adenopathy.  Skin: No rash noted. No erythema.  Psychiatric: She has a normal mood and affect. Her behavior is  normal.    BP 120/80 mmHg  Pulse 93  Temp(Src) 98.4 F (36.9 C) (Oral)  Resp 18  Ht 5\' 7"  (1.702 m)  Wt 190 lb 8 oz (86.41 kg)  BMI 29.83 kg/m2  SpO2 95% Wt Readings from Last 3 Encounters:  01/14/16 190 lb 8 oz (86.41 kg)  11/16/15 200 lb (90.719 kg)  09/03/15 201 lb 9.6 oz (91.445 kg)     Lab Results  Component Value Date   WBC 9.6 11/16/2015   HGB 12.6 11/16/2015   HCT 37.8 11/16/2015   PLT 420 11/16/2015   GLUCOSE 84 11/16/2015   CHOL 263* 08/03/2015   TRIG 183.0* 08/03/2015   HDL 39.60 08/03/2015   LDLCALC 187* 08/03/2015   ALT 24 08/03/2015   AST 24 08/03/2015   NA 137 11/16/2015   K 3.8 11/16/2015   CL 109 11/16/2015   CREATININE 0.79 11/16/2015   BUN 11 11/16/2015   CO2 24 11/16/2015   TSH 0.67 03/04/2015    Dg Chest 2 View  11/16/2015  CLINICAL DATA:  Cough and wheezing for 6 weeks EXAM: CHEST  2 VIEW COMPARISON:  March 14, 2011 FINDINGS: There is patchy infiltrate in the left upper lobe. Lungs elsewhere clear. Heart size and pulmonary vascularity are normal. No adenopathy. There is degenerative change in the thoracic spine. Spinal stimulator tip is in the mid thoracic region. IMPRESSION: Patchy infiltrate left upper lobe consistent with pneumonia. Lungs elsewhere clear. Followup PA and lateral chest radiographs recommended in 3-4 weeks following trial of antibiotic therapy to ensure resolution and exclude underlying malignancy. Electronically Signed   By: Lowella Grip III M.D.   On: 11/16/2015 19:17       Assessment & Plan:   Problem List Items Addressed This Visit    Anxiety    Followed by psychiatry.       Relevant Medications   venlafaxine XR (EFFEXOR-XR) 150 MG 24 hr capsule   venlafaxine XR (EFFEXOR-XR) 75 MG 24 hr capsule  Asthma    Previously saw Dr Annamaria Boots.  Wants to see someone locally.  Treat current flare.  Check cxr.  Refer to pulmonary.        Relevant Medications   SYMBICORT 80-4.5 MCG/ACT inhaler   albuterol (PROVENTIL) (2.5  MG/3ML) 0.083% nebulizer solution 2.5 mg (Completed)   predniSONE (DELTASONE) 10 MG tablet   Other Relevant Orders   Ambulatory referral to Pulmonology   Back pain    Chronic back pain.  Followed by pain clinic.       Relevant Medications   XTAMPZA ER 36 MG C12A   predniSONE (DELTASONE) 10 MG tablet   Depression    Seeing psychiatry.        Relevant Medications   venlafaxine XR (EFFEXOR-XR) 150 MG 24 hr capsule   venlafaxine XR (EFFEXOR-XR) 75 MG 24 hr capsule   Essential hypertension, benign    Blood pressure doing well.  Follow metabolic panel.       Relevant Orders   TSH   Basic metabolic panel   GERD    Had EGD 05/25/15 - gastritis.  On PPI.  With dysphagia.  Refer back to GI for reevaluation.        Relevant Medications   RELISTOR 150 MG TABS   pantoprazole (PROTONIX) 40 MG tablet   Hypercholesterolemia    On pravastatin.  Low cholesterol diet and exercise.  Follow lipid panel and liver function tests.        Relevant Orders   Lipid panel   Hepatic function panel   Pneumonia - Primary    Recent cxr with pneumonia.  Symptoms as outlined.  Continue nebulizer and symbicort.  Continue nasal sprays.  Treat with prednisone taper as directed.  If persistent pneumonia, add abx.  Refer to pulmonary.        Relevant Medications   SYMBICORT 80-4.5 MCG/ACT inhaler   albuterol (PROVENTIL) (2.5 MG/3ML) 0.083% nebulizer solution 2.5 mg (Completed)   Other Relevant Orders   Ambulatory referral to Pulmonology    Other Visit Diagnoses    Wheezing        Relevant Medications    albuterol (PROVENTIL) (2.5 MG/3ML) 0.083% nebulizer solution 2.5 mg (Completed)    Other Relevant Orders    DG Chest 2 View (Completed)    Ambulatory referral to Pulmonology    Dysphagia        Relevant Orders    Ambulatory referral to Gastroenterology       I spent 40 minutes with the patient and more than 50% of the time was spent in consultation regarding the above.     Einar Pheasant,  MD

## 2016-01-14 NOTE — Progress Notes (Signed)
Pre-visit discussion using our clinic review tool. No additional management support is needed unless otherwise documented below in the visit note.  

## 2016-01-15 ENCOUNTER — Other Ambulatory Visit: Payer: Self-pay | Admitting: Internal Medicine

## 2016-01-15 ENCOUNTER — Other Ambulatory Visit: Payer: Self-pay | Admitting: Physical Medicine and Rehabilitation

## 2016-01-15 DIAGNOSIS — M5412 Radiculopathy, cervical region: Secondary | ICD-10-CM

## 2016-01-15 MED ORDER — NYSTATIN 100000 UNIT/GM EX POWD: Freq: Two times a day (BID) | CUTANEOUS | Status: DC

## 2016-01-15 MED ORDER — NYSTATIN 100000 UNIT/GM EX CREA: 1.0000 "application " | TOPICAL_CREAM | Freq: Two times a day (BID) | CUTANEOUS | Status: DC

## 2016-01-15 MED ORDER — AMOXICILLIN-POT CLAVULANATE 875-125 MG PO TABS: 1.0000 | ORAL_TABLET | Freq: Two times a day (BID) | ORAL | Status: DC

## 2016-01-16 ENCOUNTER — Encounter: Payer: Self-pay | Admitting: Internal Medicine

## 2016-01-16 DIAGNOSIS — J189 Pneumonia, unspecified organism: Secondary | ICD-10-CM | POA: Insufficient documentation

## 2016-01-16 NOTE — Assessment & Plan Note (Signed)
Had EGD 05/25/15 - gastritis.  On PPI.  With dysphagia.  Refer back to GI for reevaluation.

## 2016-01-16 NOTE — Assessment & Plan Note (Signed)
Seeing psychiatry

## 2016-01-16 NOTE — Assessment & Plan Note (Signed)
Followed by psychiatry 

## 2016-01-16 NOTE — Assessment & Plan Note (Signed)
Blood pressure doing well.  Follow metabolic panel.  

## 2016-01-16 NOTE — Assessment & Plan Note (Signed)
On pravastatin.  Low cholesterol diet and exercise.  Follow lipid panel and liver function tests.   

## 2016-01-16 NOTE — Assessment & Plan Note (Signed)
Recent cxr with pneumonia.  Symptoms as outlined.  Continue nebulizer and symbicort.  Continue nasal sprays.  Treat with prednisone taper as directed.  If persistent pneumonia, add abx.  Refer to pulmonary.

## 2016-01-16 NOTE — Assessment & Plan Note (Signed)
Chronic back pain.  Followed by pain clinic.  

## 2016-01-16 NOTE — Assessment & Plan Note (Signed)
Previously saw Dr Annamaria Boots.  Wants to see someone locally.  Treat current flare.  Check cxr.  Refer to pulmonary.

## 2016-01-26 ENCOUNTER — Ambulatory Visit: Payer: Medicare Other | Admitting: Internal Medicine

## 2016-02-02 ENCOUNTER — Ambulatory Visit
Admission: RE | Admit: 2016-02-02 | Discharge: 2016-02-02 | Disposition: A | Discharge status: Home / Self Care | Payer: Medicare Other | Source: Ambulatory Visit | Attending: Physical Medicine and Rehabilitation | Admitting: Physical Medicine and Rehabilitation

## 2016-02-02 DIAGNOSIS — M50221 Other cervical disc displacement at C4-C5 level: Secondary | ICD-10-CM | POA: Diagnosis not present

## 2016-02-02 DIAGNOSIS — M50222 Other cervical disc displacement at C5-C6 level: Secondary | ICD-10-CM | POA: Diagnosis not present

## 2016-02-02 DIAGNOSIS — M5412 Radiculopathy, cervical region: Secondary | ICD-10-CM

## 2016-02-04 DIAGNOSIS — G894 Chronic pain syndrome: Secondary | ICD-10-CM | POA: Diagnosis not present

## 2016-02-04 DIAGNOSIS — M961 Postlaminectomy syndrome, not elsewhere classified: Secondary | ICD-10-CM | POA: Diagnosis not present

## 2016-02-04 DIAGNOSIS — Z79891 Long term (current) use of opiate analgesic: Secondary | ICD-10-CM | POA: Diagnosis not present

## 2016-02-04 DIAGNOSIS — M3390 Dermatopolymyositis, unspecified, organ involvement unspecified: Secondary | ICD-10-CM | POA: Diagnosis not present

## 2016-02-05 ENCOUNTER — Other Ambulatory Visit: Payer: Self-pay | Admitting: Anesthesiology

## 2016-02-05 DIAGNOSIS — R9389 Abnormal findings on diagnostic imaging of other specified body structures: Secondary | ICD-10-CM

## 2016-02-11 ENCOUNTER — Encounter: Payer: Self-pay | Admitting: *Deleted

## 2016-02-11 ENCOUNTER — Ambulatory Visit (INDEPENDENT_AMBULATORY_CARE_PROVIDER_SITE_OTHER): Payer: Medicare Other | Admitting: Internal Medicine

## 2016-02-11 ENCOUNTER — Encounter: Payer: Self-pay | Admitting: Internal Medicine

## 2016-02-11 VITALS — BP 138/88 | HR 77 | Ht 67.0 in | Wt 193.0 lb

## 2016-02-11 DIAGNOSIS — J181 Lobar pneumonia, unspecified organism: Secondary | ICD-10-CM

## 2016-02-11 DIAGNOSIS — J4520 Mild intermittent asthma, uncomplicated: Secondary | ICD-10-CM | POA: Diagnosis not present

## 2016-02-11 DIAGNOSIS — J069 Acute upper respiratory infection, unspecified: Secondary | ICD-10-CM

## 2016-02-11 DIAGNOSIS — J329 Chronic sinusitis, unspecified: Secondary | ICD-10-CM

## 2016-02-11 DIAGNOSIS — J189 Pneumonia, unspecified organism: Secondary | ICD-10-CM

## 2016-02-11 NOTE — Patient Instructions (Addendum)
Follow up with Dr. Stevenson Clinch in:2 months - HRCT w/o contrast prior to follow up visit - PFTs and 6 MWT  - increase Symbicort (80/4.5) to 2 puffs BID. -gargle and rinse after each use.  - albuterol inhaler - 2puff every 3-4 hours as needed for shortness of breath\wheezing\recurrent cough - nasal saline rinses 2-3 times per week - ENT referral for chronic sinusitis, allergy work up and evaluation for allergy shots.

## 2016-02-11 NOTE — Progress Notes (Signed)
[facility] Consultation    Date: 02/11/2016  MRN# [id number] [name] 06-26-1961  Referring Physician: Dr. Nicki Petersen PMD - [name]. [name] is a 55 y.o. old female seen in consultation for asthma optimization and management.   CC:  Chief Complaint  Patient presents with  . Advice Only    ref by [name]. Nicki Petersen: wheezing; SOB w/activity; NP cough; chest tightness    HPI:  Patient is a pleasant 55 year old female past medical history of allergic rhinitis, dermatomyositis, anxiety, depression, it'll bowel syndrome, sleep apnea, hypertension, chronic allergies, seen in consultation for chronic cough. Patient states that she's had a cough for a number of years but worse since January. Cough is productive at times associated on and off with chills, shortness of breath, wheezing. Patient also admits to recurrent upper respiratory tract infections, 2-3 times this year, placed on antibiotics and prednisone; she states the prednisone helps with her breathing and her cough mostly. She denies any weight loss She recently had a CT scan of her neck that showed a left thyroid nodule for which she schedule an ultrasound for. She uses albuterol about 5-6 times per week, She's currently on Symbicort 80 mg 1 puff twice a day. Previously she was on allergy vaccines, but did not seem to help and she stopped. She's had sinus surgery years ago for chronic sinusitis and was following with ENT in the past. Patient also had multiple back and neck surgeries and follows with the pain clinic. Patient is a never smoker and most visit office work, not exposed to any metal dust or Cur[employer]. Patient recently had another upper respiratory tract infection, was placed on another course of prednisone and antibiotics, a chest x-ray was obtained that yielded a left-sided opacity and prompted further evaluation by pulmonary.   PMHX:   Past Medical History  Diagnosis Date  . Allergic rhinitis     . Asthma   . GERD (gastroesophageal reflux disease)   . Depression   . Anxiety   . Chronic back pain   . IBS (irritable bowel syndrome)   . Hypertension   . Sleep apnea   . Collagenous colitis     followed by [name]  . Bronchitis, chronic ([place])   . Urine incontinence   . Migraine headache   . Ulcer disease   . Fibrocystic breast disease   . History of colon polyps   . Memory difficulty 11/15/2013  . Diplopia 11/15/2013  . Dysphagia   . Hypokalemia   . Arthralgia   . Endometriosis    Surgical Hx:  Past Surgical History  Procedure Laterality Date  . Nasal septoplasty w/ turbinoplasty    . Lumbar fusion  4/05 and 8/07  . Abdominal hysterectomy      endometriosis  . Lumbar disc surgery    . Septoplasty      with reduction of turbinates  . Lumbar microdiscectomy  03/28/03    L4-5 L5  . Insertion of trial spinal cord stimulator    . Insertion of permanent spinal cord stimulator    . Removal of spinal cord stimulator    . Dilation and curettage of uterus      and hysteroscopy  . Breast biopsy Right   . Back surgery      x6  . Colonoscopy with propofol N/A 06/25/2015    Procedure: COLONOSCOPY WITH PROPOFOL;  Surgeon: [name] Silvas, MD;  Location: [facility] ENDOSCOPY;  Service: Endoscopy;  Laterality: N/A;  . Esophagogastroduodenoscopy  N/A 06/25/2015    Procedure: ESOPHAGOGASTRODUODENOSCOPY (EGD);  Surgeon: [name] Silvas, MD;  Location: [facility] ENDOSCOPY;  Service: Endoscopy;  Laterality: N/A;   Family Hx:  Family History  Problem Relation Age of Onset  . Allergies Mother   . Hypertension Mother   . Arthritis Mother   . Hyperlipidemia Mother   . Stroke Mother   . Asthma      several family member  . Leukemia      uncle  . Hypertension Father   . Arthritis Father   . Diabetes Father   . Hypertension Brother   . Diabetes Brother   . Arthritis Maternal Grandmother   . Hyperlipidemia Maternal Grandmother   . Hyperlipidemia Maternal Grandfather   . Arthritis  Paternal Grandmother   . Arthritis Paternal Grandfather   . Lymphoma Maternal Aunt     non hodgkins   Social Hx:   Social History  Substance Use Topics  . Smoking status: Never Smoker   . Smokeless tobacco: Never Used  . Alcohol Use: No   Medication:   Current Outpatient Rx  Name  Route  Sig  Dispense  Refill  . albuterol (PROVENTIL HFA;VENTOLIN HFA) 108 (90 Base) MCG/ACT inhaler   Inhalation   Inhale 2 puffs into the lungs every 4 (four) hours as needed for wheezing or shortness of breath.         Marland Kitchen albuterol (PROVENTIL) (2.5 MG/3ML) 0.083% nebulizer solution   Nebulization   Take 2.5 mg by nebulization every 4 (four) hours as needed for wheezing or shortness of breath.         . diazepam (VALIUM) 5 MG tablet   Oral   Take 5 mg by mouth 3 (three) times daily as needed.           . docusate sodium (COLACE) 100 MG capsule   Oral   Take 100 mg by mouth 2 (two) times daily.         Marland Kitchen gabapentin (NEURONTIN) 800 MG tablet   Oral   Take 800 mg by mouth 4 (four) times daily.      2   . metoprolol (LOPRESSOR) 50 MG tablet      TAKE 1/2 TABLET BY MOUTH TWICE A DAY (NEED OFFICE VISIT)   30 tablet   2   . Multiple Vitamin (MULTIVITAMIN) capsule   Oral   Take 1 capsule by mouth daily.           Marland Kitchen nystatin (NYSTATIN) powder   Topical   Apply topically 2 (two) times daily.   15 g   0   . nystatin cream (MYCOSTATIN)   Topical   Apply 1 application topically 2 (two) times daily.   30 g   0   . Omega-3 Fatty Acids (FISH OIL) 1200 MG CAPS   Oral   Take 1 capsule by mouth daily.         . Oxycodone HCl 10 MG TABS   Oral   Take 10 mg by mouth 3 (three) times daily as needed.      0   . pantoprazole (PROTONIX) 40 MG tablet   Oral   Take 1 tablet (40 mg total) by mouth 2 (two) times daily before a meal.   60 tablet   2   . pravastatin (PRAVACHOL) 10 MG tablet      TAKE 1 TABLET (10 MG TOTAL) BY MOUTH DAILY.   45 tablet   7   . RELISTOR 150 MG  TABS      TAKE 3 TABLETS BY MOUTH ONCE A DAY      2     Dispense as written.   . SYMBICORT 80-4.5 MCG/ACT inhaler      INHALE 2 PUFFS BY MOUTH TWICE A DAY IN THE MORNING AND EVENING      0     Dispense as written.   . topiramate (TOPAMAX) 50 MG tablet      TAKE 2 TABLETS BY MOUTH TWICE A DAY   120 tablet   4   . triamcinolone cream (KENALOG) 0.1 %   Topical   Apply 1 application topically 2 (two) times daily. Do not use more than 7-10 days in one area   30 g   0   . UNABLE TO FIND      Med Name: I Cool 1 daily for menopause         . venlafaxine XR (EFFEXOR-XR) 150 MG 24 hr capsule   Oral   Take 150 mg by mouth 2 (two) times daily.      11   . venlafaxine XR (EFFEXOR-XR) 75 MG 24 hr capsule   Oral   Take 75 mg by mouth every morning.      11   . XTAMPZA ER 36 MG C12A      TAKE 1 CAPSULE BY MOUTH EVERY 12 HOURS      0     Dispense as written.   . zolpidem (AMBIEN) 10 MG tablet   Oral   Take 10 mg by mouth at bedtime as needed.               Allergies:  Buspirone; Buspirone hcl; Ciprofloxacin; Conjugated estrogens; Cymbalta; Doxycycline; Duloxetine; Erythromycin ethylsuccinate; Estradiol; Fentanyl; Metronidazole; Mirtazapine; Montelukast; Montelukast sodium; Montelukast sodium; Oxymorphone; Sumatriptan; and Sumatriptan succinate  Review of Systems  Constitutional: Positive for chills and diaphoresis.  Eyes: Negative for blurred vision.  Respiratory: Positive for cough, sputum production, shortness of breath and wheezing.   Gastrointestinal: Negative for heartburn and nausea.  Genitourinary: Negative for dysuria.  Musculoskeletal: Positive for myalgias, back pain, joint pain and neck pain.  Neurological: Negative for dizziness.  Endo/Heme/Allergies: Does not bruise/bleed easily.  Psychiatric/Behavioral: Negative for depression.     Physical Examination:   VS: BP 138/88 mmHg  Pulse 77  Ht 5\' 7"  (1.702 m)  Wt 193 lb (87.544 kg)  BMI  30.22 kg/m2  SpO2 99%  General Appearance: No distress  Neuro:without focal findings, mental status, speech normal, alert and oriented, cranial nerves 2-12 intact, reflexes normal and symmetric, sensation grossly normal  HEENT: PERRLA, EOM intact, no ptosis, no other lesions noticed; Mallampati 3 Pulmonary: mild expiratory wheezes, shallow BS;   Sputum Production:  None in the office CardiovascularNormal S1,S2.  No m/r/g.  Abdominal aorta pulsation normal.    Abdomen: Benign, Soft, non-tender, No masses, hepatosplenomegaly, No lymphadenopathy Renal:  No costovertebral tenderness  GU:  No performed at this time. Endoc: No evident thyromegaly, no signs of acromegaly or Cushing features Skin:   warm, no rashes, no ecchymosis  Extremities: normal, no cyanosis, clubbing, no edema, warm with normal capillary refill. Other findings:unable to palpate thyroid nodule.    Labs results:   Rad results: (The following images and results were reviewed by [name]. Stevenson Clinch on 02/11/2016). CXR 01/14/16 FINDINGS: The lungs are well-expanded. The interstitial markings remain increased. There is subtle patchy density in the left upper lobe. There are coarse lung markings in the left lower lobe which  are not entirely new. There is no pleural effusion. The heart is normal in size. The trachea is midline. Neurostimulator electrodes project posterior to the T9 and 10 thoracic spinal canal.  IMPRESSION: Patchy alveolar opacities in the left upper lobe worrisome for pneumonia. These are in an area pre-existing abnormality on the previous study, but are not as conspicuous overall today. Infrahilar density on the left appears relatively stable.  Followup PA and lateral chest X-ray is recommended in 3-4 weeks following trial of antibiotic therapy to ensure resolution and exclude underlying malignancy.  Assessment and Plan:54 yo  female seen in consultation for further workup of chronic cough and  wheezing. Asthma Patient with a known history of asthma. At this time her asthma is uncontrolled given the level of rescue inhaler that she is using.  Also, her allergies could be playing a course along with chronic sinusitis and suspected postnasal drip. Place patient on full dose Symbicort frequency.  Plan: - HRCT w/o contrast prior to follow up visit - PFTs and 6 MWT  - increase Symbicort (80/4.5) to 2 puffs BID. -gargle and rinse after each use.  - albuterol inhaler - 2puff every 3-4 hours as needed for shortness of breath\wheezing\recurrent cough - nasal saline rinses 2-3 times per week - ENT referral for chronic sinusitis, allergy work up and evaluation for allergy shots.  Recurrent upper respiratory infection (URI) Patient with recurrent upper spray tract infection is better with prednisone. She may have some type of bronchiolitis that shows a favorable response to steroids. The majority of her opacities has been in the left upper lobe, along with increased interstitial markings. Suspicion for interstitial diseases moderate given recurrent URIs, recurrent shortness of breath and recurrent cough and uncontrolled asthma.  Plan: -High-resolution CAT scan without contrast prior to follow-up -Beaudry function tests and 6 minute walk test -Optimize asthma regiment -Nasal saline rinses to-3 times per week  Pneumonia Patient currently with a left-sided upper opacity with increased interstitial markings. This will be her third or fourth pneumonia for this year that she's been treated for. There is a high suspicion for any type of bronchiolitis, such as BOOP or underlying interstitial disease or poorly controlled asthma and allergic rhinitis.  Plan: - HRCT w/o contrast prior to follow up visit - PFTs and 6 MWT  - increase Symbicort (80/4.5) to 2 puffs BID. -gargle and rinse after each use.  - albuterol inhaler - 2puff every 3-4 hours as needed for shortness of breath\wheezing\recurrent  cough - nasal saline rinses 2-3 times per week - ENT referral for chronic sinusitis, allergy work up and evaluation for allergy shots.  Sinusitis, chronic Show a known history of sinusitis, status post nasoseptal repair. Previously on allergy vaccines, with mild improvement. She's had increase allergy type symptoms, and I suspect that she female developed new allergies or further exacerbation of her current allergies. I believe at this time further workup and allergy testing by ENT will aid in yielding information to treat her underlying allergic rhinitis/sinusitis/asthma.  Plan: -Nasal saline rinses 2-3 times per week -Continue current allergy regiment -ENT referral for further workup of chronic sinusitis, allergy workup and evaluation for allergy shots    Updated Medication List Outpatient Encounter Prescriptions as of 02/11/2016  Medication Sig  . albuterol (PROVENTIL HFA;VENTOLIN HFA) 108 (90 Base) MCG/ACT inhaler Inhale 2 puffs into the lungs every 4 (four) hours as needed for wheezing or shortness of breath.  Marland Kitchen albuterol (PROVENTIL) (2.5 MG/3ML) 0.083% nebulizer solution Take 2.5 mg by nebulization every  4 (four) hours as needed for wheezing or shortness of breath.  . diazepam (VALIUM) 5 MG tablet Take 5 mg by mouth 3 (three) times daily as needed.    . docusate sodium (COLACE) 100 MG capsule Take 100 mg by mouth 2 (two) times daily.  Marland Kitchen gabapentin (NEURONTIN) 800 MG tablet Take 800 mg by mouth 4 (four) times daily.  . metoprolol (LOPRESSOR) 50 MG tablet TAKE 1/2 TABLET BY MOUTH TWICE A DAY (NEED OFFICE VISIT)  . Multiple Vitamin (MULTIVITAMIN) capsule Take 1 capsule by mouth daily.    Marland Kitchen nystatin (NYSTATIN) powder Apply topically 2 (two) times daily.  Marland Kitchen nystatin cream (MYCOSTATIN) Apply 1 application topically 2 (two) times daily.  . Omega-3 Fatty Acids (FISH OIL) 1200 MG CAPS Take 1 capsule by mouth daily.  . Oxycodone HCl 10 MG TABS Take 10 mg by mouth 3 (three) times daily as  needed.  . pantoprazole (PROTONIX) 40 MG tablet Take 1 tablet (40 mg total) by mouth 2 (two) times daily before a meal.  . pravastatin (PRAVACHOL) 10 MG tablet TAKE 1 TABLET (10 MG TOTAL) BY MOUTH DAILY.  Marland Kitchen RELISTOR 150 MG TABS TAKE 3 TABLETS BY MOUTH ONCE A DAY  . SYMBICORT 80-4.5 MCG/ACT inhaler INHALE 2 PUFFS BY MOUTH TWICE A DAY IN THE MORNING AND EVENING  . topiramate (TOPAMAX) 50 MG tablet TAKE 2 TABLETS BY MOUTH TWICE A DAY  . triamcinolone cream (KENALOG) 0.1 % Apply 1 application topically 2 (two) times daily. Do not use more than 7-10 days in one area  . UNABLE TO FIND Med Name: I Cool 1 daily for menopause  . venlafaxine XR (EFFEXOR-XR) 150 MG 24 hr capsule Take 150 mg by mouth 2 (two) times daily.  Marland Kitchen venlafaxine XR (EFFEXOR-XR) 75 MG 24 hr capsule Take 75 mg by mouth every morning.  Marland Kitchen XTAMPZA ER 36 MG C12A TAKE 1 CAPSULE BY MOUTH EVERY 12 HOURS  . zolpidem (AMBIEN) 10 MG tablet Take 10 mg by mouth at bedtime as needed.    . [DISCONTINUED] amoxicillin-clavulanate (AUGMENTIN) 875-125 MG tablet Take 1 tablet by mouth 2 (two) times daily.  . [DISCONTINUED] Calcium Carbonate-Vitamin D (CALCIUM-VITAMIN D) 500-200 MG-UNIT tablet Take by mouth.  . [DISCONTINUED] predniSONE (DELTASONE) 10 MG tablet Take 6 tablets x 1 day and then decrease by 1/2 tablet per day until down to zero mg.   No facility-administered encounter medications on file as of 02/11/2016.    Orders for this visit: Orders Placed This Encounter  Procedures  . CT Chest High Resolution    Standing Status: Future     Number of Occurrences:      Standing Expiration Date: 04/12/2017    Order Specific Question:  Reason for Exam (SYMPTOM  OR DIAGNOSIS REQUIRED)    Answer:  recurrent URI    Order Specific Question:  Is the patient pregnant?    Answer:  No    Order Specific Question:  Preferred imaging location?    Answer:  [facility]  . Ambulatory referral to ENT    Referral Priority:  Routine    Referral Type:   Consultation    Referral Reason:  Specialty Services Required    Requested Specialty:  Otolaryngology    Number of Visits Requested:  1  . Pulmonary function test    Standing Status: Future     Number of Occurrences:      Standing Expiration Date: 02/10/2017    Order Specific Question:  Where should this test be performed?  Answer:  [facility]    Order Specific Question:  MIP/MEP    Answer:  No    Order Specific Question:  6 minute walk    Answer:  Yes    Order Specific Question:  ABG    Answer:  No    Order Specific Question:  Diffusion capacity (DLCO)    Answer:  Yes    Order Specific Question:  Lung volumes    Answer:  Yes    Order Specific Question:  Methacholine challenge    Answer:  No  . 6 minute walk    Standing Status: Future     Number of Occurrences:      Standing Expiration Date: 02/10/2017     Thank  you for the consultation and for allowing [facility] to assist in the care of your patient. Our recommendations are noted above.  Please contact us if we can be of further service.   Vilinda Boehringer, MD [facility] Office Number: [phone]  Note: This note was prepared with Dragon dictation along with smaller phrase technology. Any transcriptional errors that result from this process are unintentional.

## 2016-02-15 ENCOUNTER — Ambulatory Visit
Admission: RE | Admit: 2016-02-15 | Discharge: 2016-02-15 | Disposition: A | Discharge status: Home / Self Care | Payer: Managed Care, Other (non HMO) | Source: Ambulatory Visit | Attending: Anesthesiology | Admitting: Anesthesiology

## 2016-02-15 DIAGNOSIS — R9389 Abnormal findings on diagnostic imaging of other specified body structures: Secondary | ICD-10-CM

## 2016-02-15 DIAGNOSIS — E042 Nontoxic multinodular goiter: Secondary | ICD-10-CM | POA: Diagnosis not present

## 2016-02-15 NOTE — Assessment & Plan Note (Signed)
Patient with a known history of asthma. At this time her asthma is uncontrolled given the level of rescue inhaler that she is using.  Also, her allergies could be playing a course along with chronic sinusitis and suspected postnasal drip. Place patient on full dose Symbicort frequency.  Plan: - HRCT w/o contrast prior to follow up visit - PFTs and 6 MWT  - increase Symbicort (80/4.5) to 2 puffs BID. -gargle and rinse after each use.  - albuterol inhaler - 2puff every 3-4 hours as needed for shortness of breath\wheezing\recurrent cough - nasal saline rinses 2-3 times per week - ENT referral for chronic sinusitis, allergy work up and evaluation for allergy shots.

## 2016-02-15 NOTE — Assessment & Plan Note (Signed)
Patient currently with a left-sided upper opacity with increased interstitial markings. This will be her third or fourth pneumonia for this year that she's been treated for. There is a high suspicion for any type of bronchiolitis, such as BOOP or underlying interstitial disease or poorly controlled asthma and allergic rhinitis.  Plan: - HRCT w/o contrast prior to follow up visit - PFTs and 6 MWT  - increase Symbicort (80/4.5) to 2 puffs BID. -gargle and rinse after each use.  - albuterol inhaler - 2puff every 3-4 hours as needed for shortness of breath\wheezing\recurrent cough - nasal saline rinses 2-3 times per week - ENT referral for chronic sinusitis, allergy work up and evaluation for allergy shots.

## 2016-02-15 NOTE — Assessment & Plan Note (Signed)
Show a known history of sinusitis, status post nasoseptal repair. Previously on allergy vaccines, with mild improvement. She's had increase allergy type symptoms, and I suspect that she female developed new allergies or further exacerbation of her current allergies. I believe at this time further workup and allergy testing by ENT will aid in yielding information to treat her underlying allergic rhinitis/sinusitis/asthma.  Plan: -Nasal saline rinses 2-3 times per week -Continue current allergy regiment -ENT referral for further workup of chronic sinusitis, allergy workup and evaluation for allergy shots

## 2016-02-15 NOTE — Assessment & Plan Note (Signed)
Patient with recurrent upper spray tract infection is better with prednisone. She may have some type of bronchiolitis that shows a favorable response to steroids. The majority of her opacities has been in the left upper lobe, along with increased interstitial markings. Suspicion for interstitial diseases moderate given recurrent URIs, recurrent shortness of breath and recurrent cough and uncontrolled asthma.  Plan: -High-resolution CAT scan without contrast prior to follow-up -Beaudry function tests and 6 minute walk test -Optimize asthma regiment -Nasal saline rinses to-3 times per week

## 2016-02-15 NOTE — Telephone Encounter (Signed)
Unread mychart message mailed to patient 

## 2016-02-18 DIAGNOSIS — G4700 Insomnia, unspecified: Secondary | ICD-10-CM | POA: Diagnosis not present

## 2016-02-18 DIAGNOSIS — M62830 Muscle spasm of back: Secondary | ICD-10-CM | POA: Diagnosis not present

## 2016-02-18 DIAGNOSIS — M47817 Spondylosis without myelopathy or radiculopathy, lumbosacral region: Secondary | ICD-10-CM | POA: Diagnosis not present

## 2016-02-18 DIAGNOSIS — G894 Chronic pain syndrome: Secondary | ICD-10-CM | POA: Diagnosis not present

## 2016-02-23 ENCOUNTER — Other Ambulatory Visit: Payer: Self-pay | Admitting: Physical Medicine and Rehabilitation

## 2016-02-23 DIAGNOSIS — E042 Nontoxic multinodular goiter: Secondary | ICD-10-CM

## 2016-02-24 ENCOUNTER — Other Ambulatory Visit: Payer: Self-pay | Admitting: Physical Medicine and Rehabilitation

## 2016-02-24 DIAGNOSIS — E042 Nontoxic multinodular goiter: Secondary | ICD-10-CM

## 2016-03-02 ENCOUNTER — Other Ambulatory Visit (HOSPITAL_COMMUNITY)
Admission: RE | Admit: 2016-03-02 | Discharge: 2016-03-02 | Disposition: A | Discharge status: Home / Self Care | Payer: Medicare Other | Source: Ambulatory Visit | Attending: Interventional Radiology | Admitting: Interventional Radiology

## 2016-03-02 ENCOUNTER — Telehealth: Payer: Self-pay | Admitting: Internal Medicine

## 2016-03-02 ENCOUNTER — Ambulatory Visit
Admission: RE | Admit: 2016-03-02 | Discharge: 2016-03-02 | Disposition: A | Discharge status: Home / Self Care | Payer: Medicare Other | Source: Ambulatory Visit | Attending: Physical Medicine and Rehabilitation | Admitting: Physical Medicine and Rehabilitation

## 2016-03-02 ENCOUNTER — Ambulatory Visit
Admission: RE | Admit: 2016-03-02 | Discharge: 2016-03-02 | Disposition: A | Discharge status: Home / Self Care | Payer: Managed Care, Other (non HMO) | Source: Ambulatory Visit | Attending: Physical Medicine and Rehabilitation | Admitting: Physical Medicine and Rehabilitation

## 2016-03-02 DIAGNOSIS — E042 Nontoxic multinodular goiter: Secondary | ICD-10-CM

## 2016-03-02 DIAGNOSIS — E041 Nontoxic single thyroid nodule: Secondary | ICD-10-CM | POA: Diagnosis not present

## 2016-03-02 MED ORDER — ALBUTEROL SULFATE HFA 108 (90 BASE) MCG/ACT IN AERS: 2.0000 | INHALATION_SPRAY | RESPIRATORY_TRACT | Status: DC | PRN

## 2016-03-02 NOTE — Telephone Encounter (Signed)
Medication sent to pharmacy. Nothing further needed. 

## 2016-03-02 NOTE — Telephone Encounter (Signed)
°*  STAT* If patient is at the pharmacy, call can be transferred to refill team.   1. Which medications need to be refilled? (please list name of each medication and dose if known)  ;VENTOLIN spray   2. Which pharmacy/location (including street and city if local pharmacy) is medication to be sent to? [facility] on [address]  3. Do they need a 30 day or 90 day supply? 90 day

## 2016-03-18 DIAGNOSIS — M62830 Muscle spasm of back: Secondary | ICD-10-CM | POA: Diagnosis not present

## 2016-03-18 DIAGNOSIS — G4700 Insomnia, unspecified: Secondary | ICD-10-CM | POA: Diagnosis not present

## 2016-03-18 DIAGNOSIS — M47817 Spondylosis without myelopathy or radiculopathy, lumbosacral region: Secondary | ICD-10-CM | POA: Diagnosis not present

## 2016-03-18 DIAGNOSIS — G894 Chronic pain syndrome: Secondary | ICD-10-CM | POA: Diagnosis not present

## 2016-03-18 DIAGNOSIS — Z79891 Long term (current) use of opiate analgesic: Secondary | ICD-10-CM | POA: Diagnosis not present

## 2016-03-24 ENCOUNTER — Encounter: Payer: Self-pay | Admitting: *Deleted

## 2016-03-28 ENCOUNTER — Other Ambulatory Visit: Payer: Self-pay | Admitting: *Deleted

## 2016-03-28 ENCOUNTER — Telehealth: Payer: Self-pay | Admitting: Internal Medicine

## 2016-03-28 MED ORDER — SYMBICORT 80-4.5 MCG/ACT IN AERO: INHALATION_SPRAY | RESPIRATORY_TRACT | 2 refills | Status: DC

## 2016-03-28 NOTE — Telephone Encounter (Signed)
Pt states she is coughing up green mucus, SOB worse, chest tightness, denies fever. Has taken Symbicort and used albuterol neb x 2 today. Pt states she recently had a thyroid bx and they think she may have thyroid ca. States she wonders if this can make her breathing worse. Please advise.

## 2016-03-28 NOTE — Telephone Encounter (Signed)
Pt c/o Shortness Of Breath: STAT if SOB developed within the last 24 hours or pt is noticeably SOB on the phone  1. Are you currently SOB (can you hear that pt is SOB on the phone)?   No   2. How long have you been experiencing SOB?   Chronic but worse now  3. Are you SOB when sitting or when up moving around?   All the time   4. Are you currently experiencing any other symptoms?   Please call to discuss.  Patient says she is sick and may need some prednisone .  Please call also about upcoming testing as she is not sure she will be able to perform.

## 2016-03-28 NOTE — Telephone Encounter (Signed)
Pt informed of response. Nothing further needed. 

## 2016-03-28 NOTE — Telephone Encounter (Signed)
Thyroid biopsy could have caused some irritation and bronchospams Rec: 1. Scheduled albuterol 2 puffs, q8hrs x 3 days 2. Cont with symbicort  If no improvement after 3 days, then acute care visit.   Thanks VM

## 2016-04-05 DIAGNOSIS — J340 Abscess, furuncle and carbuncle of nose: Secondary | ICD-10-CM | POA: Diagnosis not present

## 2016-04-05 DIAGNOSIS — J301 Allergic rhinitis due to pollen: Secondary | ICD-10-CM | POA: Diagnosis not present

## 2016-04-07 ENCOUNTER — Ambulatory Visit (INDEPENDENT_AMBULATORY_CARE_PROVIDER_SITE_OTHER): Payer: Medicare Other | Admitting: *Deleted

## 2016-04-07 DIAGNOSIS — J4520 Mild intermittent asthma, uncomplicated: Secondary | ICD-10-CM

## 2016-04-07 DIAGNOSIS — J189 Pneumonia, unspecified organism: Secondary | ICD-10-CM

## 2016-04-07 DIAGNOSIS — J181 Lobar pneumonia, unspecified organism: Secondary | ICD-10-CM

## 2016-04-07 LAB — PULMONARY FUNCTION TEST
DL/VA % pred: 84 %
DL/VA: 4.33 ml/min/mmHg/L
DLCO unc % pred: 114 %
DLCO unc: 32.39 ml/min/mmHg
FEF 25-75 Post: 2.06 L/sec
FEF 25-75 Pre: 0.68 L/sec
FEF2575-%Change-Post: 202 %
FEF2575-%Pred-Post: 77 %
FEF2575-%Pred-Pre: 25 %
FEV1-%Change-Post: 34 %
FEV1-%Pred-Post: 62 %
FEV1-%Pred-Pre: 46 %
FEV1-Post: 1.83 L
FEV1-Pre: 1.37 L
FEV1FVC-%Change-Post: 14 %
FEV1FVC-%Pred-Pre: 80 %
FEV6-%Change-Post: 16 %
FEV6-%Pred-Post: 68 %
FEV6-%Pred-Pre: 58 %
FEV6-Post: 2.5 L
FEV6-Pre: 2.14 L
FEV6FVC-%Change-Post: 0 %
FEV6FVC-%Pred-Post: 103 %
FEV6FVC-%Pred-Pre: 102 %
FVC-%Change-Post: 16 %
FVC-%Pred-Post: 66 %
FVC-%Pred-Pre: 57 %
FVC-Post: 2.51 L
FVC-Pre: 2.15 L
Post FEV1/FVC ratio: 73 %
Post FEV6/FVC ratio: 100 %
Pre FEV1/FVC ratio: 64 %
Pre FEV6/FVC Ratio: 100 %

## 2016-04-07 NOTE — Progress Notes (Signed)
SMW performed today. 

## 2016-04-07 NOTE — Progress Notes (Signed)
PFT performed today with nitrogen washout. 

## 2016-04-12 ENCOUNTER — Ambulatory Visit: Payer: Managed Care, Other (non HMO)

## 2016-04-13 DIAGNOSIS — G4700 Insomnia, unspecified: Secondary | ICD-10-CM | POA: Diagnosis not present

## 2016-04-13 DIAGNOSIS — G894 Chronic pain syndrome: Secondary | ICD-10-CM | POA: Diagnosis not present

## 2016-04-13 DIAGNOSIS — M47817 Spondylosis without myelopathy or radiculopathy, lumbosacral region: Secondary | ICD-10-CM | POA: Diagnosis not present

## 2016-04-13 DIAGNOSIS — M62830 Muscle spasm of back: Secondary | ICD-10-CM | POA: Diagnosis not present

## 2016-04-15 ENCOUNTER — Ambulatory Visit: Payer: Medicare Other | Admitting: Internal Medicine

## 2016-04-19 ENCOUNTER — Encounter: Payer: Self-pay | Admitting: General Surgery

## 2016-04-19 ENCOUNTER — Ambulatory Visit (INDEPENDENT_AMBULATORY_CARE_PROVIDER_SITE_OTHER): Payer: Medicare Other | Admitting: General Surgery

## 2016-04-19 ENCOUNTER — Ambulatory Visit: Payer: Medicare Other | Admitting: Internal Medicine

## 2016-04-19 VITALS — BP 138/82 | HR 90 | Resp 16 | Ht 67.0 in | Wt 185.0 lb

## 2016-04-19 DIAGNOSIS — E041 Nontoxic single thyroid nodule: Secondary | ICD-10-CM | POA: Diagnosis not present

## 2016-04-19 DIAGNOSIS — J301 Allergic rhinitis due to pollen: Secondary | ICD-10-CM | POA: Diagnosis not present

## 2016-04-19 NOTE — Patient Instructions (Addendum)
The patient is aware to call back for any questions or concerns.  FNA thyroid in the office with special testing

## 2016-04-19 NOTE — Progress Notes (Signed)
Patient ID: [name], female   DOB: 1960/09/27, 55 y.o.   MRN: [id number]  Chief Complaint  Patient presents with  . Thyroid Nodule    HPI [name] is a 55 y.o. female.  Here today for evaluation of thyroid nodules. She states she was having neck pain with numbness in right arm. When she had a CT scan showed thyroid nodules. She states she does admit to swallowing difficulty. Dr Vira Agar did stretch her esophagus with her last endoscopy with no relief. Ultrasound thyroid was done with fine needle aspiration on 03-02-16. [facility]. She is here today with her husband, Novelle Rawlings. Barium swallow was 2010. She has a CT chest scheduled for tomorrow, her Pulmonologist is Dr. Stevenson Clinch.  HPI  Past Medical History:  Diagnosis Date  . Allergic rhinitis   . Anxiety   . Arthralgia   . Asthma   . Bronchitis, chronic ([place])   . Chronic back pain   . Collagenous colitis    followed by Dr Tiffany Kocher  . Depression   . Diplopia 11/15/2013  . Dysphagia   . Endometriosis   . Fibrocystic breast disease   . GERD (gastroesophageal reflux disease)   . History of colon polyps   . Hyperlipidemia   . Hypertension   . Hypokalemia   . IBS (irritable bowel syndrome)   . Memory difficulty 11/15/2013  . Migraine headache   . Pneumonia 2017   x2  . Sleep apnea   . Ulcer disease   . Urine incontinence     Past Surgical History:  Procedure Laterality Date  . ABDOMINAL HYSTERECTOMY     endometriosis  . BACK SURGERY     x6  . BREAST BIOPSY Right   . COLONOSCOPY WITH PROPOFOL N/A 06/25/2015   Procedure: COLONOSCOPY WITH PROPOFOL;  Surgeon: Manya Silvas, MD;  Location: [facility] ENDOSCOPY;  Service: Endoscopy;  Laterality: N/A;  . DILATION AND CURETTAGE OF UTERUS     and hysteroscopy  . ESOPHAGOGASTRODUODENOSCOPY N/A 06/25/2015   Procedure: ESOPHAGOGASTRODUODENOSCOPY (EGD);  Surgeon: Manya Silvas, MD;  Location: [facility] ENDOSCOPY;  Service: Endoscopy;  Laterality: N/A;  .  insertion of permanent spinal cord stimulator    . insertion of trial spinal cord stimulator    . LUMBAR DISC SURGERY    . LUMBAR FUSION  4/05 and 8/07  . LUMBAR MICRODISCECTOMY  03/28/03   L4-5 L5  . NASAL SEPTOPLASTY W/ TURBINOPLASTY    . removal of spinal cord stimulator    . SEPTOPLASTY     with reduction of turbinates    Family History  Problem Relation Age of Onset  . Allergies Mother   . Hypertension Mother   . Arthritis Mother   . Hyperlipidemia Mother   . Stroke Mother   . Hypertension Father   . Arthritis Father   . Diabetes Father   . Hypertension Brother   . Diabetes Brother   . Arthritis Maternal Grandmother   . Hyperlipidemia Maternal Grandmother   . Hyperlipidemia Maternal Grandfather   . Arthritis Paternal Grandmother   . Arthritis Paternal Grandfather   . Asthma      several family member  . Leukemia      uncle  . Lymphoma Maternal Aunt     non hodgkins    Social History Social History  Substance Use Topics  . Smoking status: Never Smoker  . Smokeless tobacco: Never Used  . Alcohol use No    Allergies  Allergen  Reactions  . Buspirone Other (See Comments)    SEVERE HEADACHE  . Buspirone Hcl   . Ciprofloxacin   . Conjugated Estrogens   . Cymbalta [Duloxetine Hcl] Other (See Comments)    Headache  . Doxycycline     REACTION: causes severe abd pain  . Duloxetine Other (See Comments)    Headache  . Erythromycin Ethylsuccinate   . Estradiol   . Fentanyl   . Metronidazole   . Mirtazapine   . Montelukast Other (See Comments)    SEVERE HEADACHE  . Montelukast Sodium   . Montelukast Sodium   . Oxymorphone     REACTION: severe headaches  . Sumatriptan   . Sumatriptan Succinate Other (See Comments)    Current Outpatient Prescriptions  Medication Sig Dispense Refill  . albuterol (PROVENTIL HFA;VENTOLIN HFA) 108 (90 Base) MCG/ACT inhaler Inhale 2 puffs into the lungs every 4 (four) hours as needed for wheezing or shortness of breath. 1  Inhaler 3  . albuterol (PROVENTIL) (2.5 MG/3ML) 0.083% nebulizer solution Take 2.5 mg by nebulization every 4 (four) hours as needed for wheezing or shortness of breath.    . diazepam (VALIUM) 5 MG tablet Take 5 mg by mouth 3 (three) times daily as needed.      . docusate sodium (COLACE) 100 MG capsule Take 100 mg by mouth 2 (two) times daily.    Marland Kitchen gabapentin (NEURONTIN) 800 MG tablet Take 800 mg by mouth 4 (four) times daily.  2  . metoprolol (LOPRESSOR) 50 MG tablet TAKE 1/2 TABLET BY MOUTH TWICE A DAY (NEED OFFICE VISIT) 30 tablet 2  . Multiple Vitamin (MULTIVITAMIN) capsule Take 1 capsule by mouth daily.      Marland Kitchen nystatin (NYSTATIN) powder Apply topically 2 (two) times daily. 15 g 0  . nystatin cream (MYCOSTATIN) Apply 1 application topically 2 (two) times daily. 30 g 0  . Omega-3 Fatty Acids (FISH OIL) 1200 MG CAPS Take 1 capsule by mouth daily.    . Oxycodone HCl 10 MG TABS Take 10 mg by mouth 3 (three) times daily as needed.  0  . pantoprazole (PROTONIX) 40 MG tablet Take 1 tablet (40 mg total) by mouth 2 (two) times daily before a meal. 60 tablet 2  . pravastatin (PRAVACHOL) 10 MG tablet TAKE 1 TABLET (10 MG TOTAL) BY MOUTH DAILY. 45 tablet 7  . RELISTOR 150 MG TABS TAKE 3 TABLETS BY MOUTH ONCE A DAY prn  2  . SYMBICORT 80-4.5 MCG/ACT inhaler INHALE 2 PUFFS BY MOUTH TWICE A DAY IN THE MORNING AND EVENING 1 Inhaler 2  . topiramate (TOPAMAX) 50 MG tablet TAKE 2 TABLETS BY MOUTH TWICE A DAY 120 tablet 4  . triamcinolone cream (KENALOG) 0.1 % Apply 1 application topically 2 (two) times daily. Do not use more than 7-10 days in one area 30 g 0  . UNABLE TO FIND Med Name: I Cool 1 daily for menopause    . venlafaxine XR (EFFEXOR-XR) 150 MG 24 hr capsule Take 150 mg by mouth 2 (two) times daily.  11  . venlafaxine XR (EFFEXOR-XR) 75 MG 24 hr capsule Take 75 mg by mouth every morning.  11  . XTAMPZA ER 36 MG C12A TAKE 1 CAPSULE BY MOUTH EVERY 12 HOURS  0  . zolpidem (AMBIEN) 10 MG tablet Take 10 mg  by mouth at bedtime as needed.       No current facility-administered medications for this visit.     Review of Systems Review of Systems  Constitutional: Negative.   Respiratory: Positive for shortness of breath (history of asthma).   Cardiovascular: Negative.     Blood pressure 138/82, pulse 90, resp. rate 16, height 5\' 7"  (1.702 m), weight 185 lb (83.9 kg).  Physical Exam Physical Exam  Constitutional: She is oriented to person, place, and time. She appears well-developed and well-nourished.  HENT:  Mouth/Throat: Oropharynx is clear and moist.  Eyes: Conjunctivae are normal. No scleral icterus.  Neck: Trachea normal. Neck supple. No thyroid mass and no thyromegaly present.  Cardiovascular: Normal rate, regular rhythm and normal heart sounds.   Pulmonary/Chest: Effort normal. No respiratory distress.  Rhonchi noted in the upper lobes bilaterally.  Lymphadenopathy:    She has no cervical adenopathy.  Neurological: She is alert and oriented to person, place, and time.  Skin: Skin is warm and dry.  Psychiatric: Her behavior is normal.    Data Reviewed CT of the cervical spine dated 02/02/2016 was reviewed. Partial imaging of the thyroid was noted with 2 dominant nodules in the left lobe.  Dedicated thyroid ultrasound 02/15/2016 reviewed with 2 less than 2 cm nodules the left lobe, inferior nodule with extensive calcification.  Ultrasound-guided biopsy results and pathology of 03/02/2016 reviewed. Specimen one was the 1.5 cm nodule in the mid body of the thyroid. Specimen 2 was the 1.2 cm nodule in the lower pole of the thyroid with extensive calcifications.  THYROID, FINE NEEDLE ASPIRATION, LMP - LLP (SPECIMEN 1 OF 2, COLLECTED ON 03/02/16): ATYPIA OF UNDETERMINED SIGNIFICANCE OR FOLLICULAR LESION OF UNDETERMINED SIGNIFICANCE (BETHESDA CATEGORY III). SEE COMMENT. COMMENT: THE SPECIMEN IS HYPERCELLULAR AND CONSISTS OF SMALL TO LARGE GROUPS OF FOLLICULAR EPITHELIAL CELLS WITH  SOME CYTOLOGIC ATYPIA, INCLUDING INTRANUCLEAR GROOVES, NUCLEAR OVERLAP, AND SOME HURTHLE CELL CHANGES. BASED ON THESE FEATURES, A FOLLICULAR LESION/NEOPLASM CAN NOT BE ENTIRELY RULED OUT.   THYROID, FINE NEEDLE ASPIRATION LEFT LOBE LLP (SPECIMEN 2 OF 2 COLLECTED 03/02/16) CONSISTENT WITH BENIGN FOLLICULAR NODULE (BETHESDA CATEGORY II).  TSH dated 03/04/2015 normal at 0.67.  Assessment    Abnormal thyroid imaging with surprisingly atypical cells noted in the less suspicious nodule (1.5 cm) rather than the slightly smaller nodule with extensive calcification.   Negative clinical exam.    Plan    The cytology reports are somewhat surprising with issues lesion having the more worrisome cytology. The possibility to separate "wheat from chaff" was specialized testing if repeat sampling is obtained was reviewed and the patient was amenable. I think this is preferable to proceeding to hemithyroidectomy.  We'll coordinate with the pathology department to have sampling sent for specialized testing and office follow-up will be scheduled accordingly. The opportunity to having the repeat samples completed by the radiology service was declined.) Although she described a very smooth procedure.)    FNA thyroid  Pulmonologist Dr Stevenson Clinch  This information has been scribed by Karie Fetch RN, BSN,BC.   Robert Bellow 04/19/2016, 9:48 PM

## 2016-04-20 ENCOUNTER — Ambulatory Visit
Admission: RE | Admit: 2016-04-20 | Discharge: 2016-04-20 | Disposition: A | Discharge status: Home / Self Care | Payer: Medicare Other | Source: Ambulatory Visit | Attending: Internal Medicine | Admitting: Internal Medicine

## 2016-04-20 DIAGNOSIS — I700 Atherosclerosis of aorta: Secondary | ICD-10-CM | POA: Diagnosis not present

## 2016-04-20 DIAGNOSIS — J4520 Mild intermittent asthma, uncomplicated: Secondary | ICD-10-CM | POA: Diagnosis not present

## 2016-04-20 DIAGNOSIS — E041 Nontoxic single thyroid nodule: Secondary | ICD-10-CM | POA: Diagnosis not present

## 2016-04-20 DIAGNOSIS — J181 Lobar pneumonia, unspecified organism: Secondary | ICD-10-CM

## 2016-04-20 DIAGNOSIS — J069 Acute upper respiratory infection, unspecified: Secondary | ICD-10-CM

## 2016-04-20 DIAGNOSIS — J189 Pneumonia, unspecified organism: Secondary | ICD-10-CM | POA: Diagnosis not present

## 2016-04-20 DIAGNOSIS — J984 Other disorders of lung: Secondary | ICD-10-CM | POA: Insufficient documentation

## 2016-04-21 ENCOUNTER — Telehealth: Payer: Self-pay

## 2016-04-21 ENCOUNTER — Telehealth: Payer: Self-pay | Admitting: *Deleted

## 2016-04-21 MED ORDER — PREDNISONE 20 MG PO TABS
40.0000 mg | ORAL_TABLET | Freq: Every day | ORAL | 0 refills | Status: DC
Start: 2016-04-21 — End: 2016-04-25

## 2016-04-21 NOTE — Telephone Encounter (Signed)
Pt informed and RX sent to pharmacy. Nothing further needed.

## 2016-04-21 NOTE — Telephone Encounter (Signed)
LMOM for pt to return call for CT results.

## 2016-04-21 NOTE — Telephone Encounter (Signed)
LM in regards to rescheduling 04/25/16 appointment per VM. Will await call back.

## 2016-04-21 NOTE — Telephone Encounter (Signed)
Prednisone 40 mg daily until she sees me next week, then we'll go from there  [place]

## 2016-04-21 NOTE — Telephone Encounter (Signed)
Pt informed of results. Pt states she is still having wheezing and SOB. VM stated that we could put pt on steroid if still having symptoms. Pt has been r/s to see DS on Monday since VM unavailable.   Please advise on steroid for pt. Thanks.

## 2016-04-21 NOTE — Telephone Encounter (Signed)
-----   Message from Vilinda Boehringer, MD sent at 04/21/2016  9:00 AM EDT ----- Regarding: CT  Chest results Please inform patient and I have reviewed her CT scan, at this time there is mild to moderate inflammation in her lungs from an unknown trigger. Further details will be discussed at follow-up visit. Alternatively, if patient is symptomatic (sob, doe, cough, etc), we can start her on steroids prior to her follow-up visit

## 2016-04-21 NOTE — Telephone Encounter (Signed)
Appointment scheduled for 04/25/16 with DS per pt request as she would like to be seen before VM next available appointment.  Nothing further needed.

## 2016-04-25 ENCOUNTER — Ambulatory Visit: Payer: Medicare Other | Admitting: Internal Medicine

## 2016-04-25 ENCOUNTER — Encounter: Payer: Self-pay | Admitting: Pulmonary Disease

## 2016-04-25 ENCOUNTER — Ambulatory Visit (INDEPENDENT_AMBULATORY_CARE_PROVIDER_SITE_OTHER): Payer: Medicare Other | Admitting: Pulmonary Disease

## 2016-04-25 VITALS — BP 128/82 | HR 68 | Ht 67.0 in | Wt 197.4 lb

## 2016-04-25 DIAGNOSIS — R918 Other nonspecific abnormal finding of lung field: Secondary | ICD-10-CM | POA: Diagnosis not present

## 2016-04-25 DIAGNOSIS — J4540 Moderate persistent asthma, uncomplicated: Secondary | ICD-10-CM | POA: Diagnosis not present

## 2016-04-25 DIAGNOSIS — M3390 Dermatopolymyositis, unspecified, organ involvement unspecified: Secondary | ICD-10-CM

## 2016-04-25 NOTE — Patient Instructions (Signed)
Office spirometry today  Continue Symbicort on a schedule and albuterol as needed  Follow up in 6 weeks with Dr Stevenson Clinch  If your symptoms persist or are difficult to control, we might need to consider low dose of daily prednisone

## 2016-04-25 NOTE — Progress Notes (Signed)
PROBLEMS: Dermatomyositis Asthma Evanescent pulmonary infiltrates DATA: 03/17/11 CT chest: Atelectasis versus infiltrate within the superior segments of the right upper and right lower lobes 11/16/15: Patchy infiltrate left upper lobe consistent with pneumonia 01/14/16 CXR: Patchy alveolar opacities in the left upper lobe 04/07/16 PFTs: mod-severe obstruction with substantial improvement after bronchodilator (FEV1 1.37 > 1.83 liters, 46-62% pred), mild restriction, normal DLCO 04/07/16 6MWT: 192 meters. More limited by LE weakness 04/20/16 HRCT chest: Extensive air trapping, indicating small airways disease. Mild diffuse bronchial wall thickening. Mild patchy regions of ground-glass attenuation in the mid to upper lungs bilaterally. 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 04/25/16 Office spiro: FEV1 2.15 liters (73%)  INTERVAL HISTORY: Increased dyspnea last week. Course of prednisone called in for her  SUBJ: Previously seen by VM with history of dermatomyositis and asthma. She has also been diagnosed with repeated "pneumonias", most recently 01/2016. She typically receives antibiotics and prednisone with resolution of her respiratory symptoms and pulmonary infiltrates. She had increased dyspnea last week and was prescribed (by me) a 5 day course of prednisone of 40 mg daily. Her symptoms have returned to baseline. At her baseline, she is more limited by LE pain and weakness than by dyspnea. This appointment was initially scheduled with Dr Stevenson Clinch for review of repeat CT chest, PFTs and 6MWT. Dr Stevenson Clinch was called out of town and I am seeing in his absence.  Her only maintenance pulmonary medication is Symbicort 80/4.5 BID She uses albuterol rescue inhaler on average 2X/d but on bad days will use as often as 6X/d  Of note, she did not use Symbicort on the morning of PFTs 04/07/16.   OBJ: Vitals:   04/25/16 1028  BP: 128/82  Pulse: 68  SpO2: 97%  Weight:  197 lb 6.4 oz (89.5 kg)  Height: 5\' 7"  (1.702 m)    Gen: NAD HEENT: WNL Neck: NO LAN, no JVD noted Lungs: full BS, normal percussion, no wheezes Cardiovascular: Reg, no M noted Abdomen: Soft, NT +BS Ext: no C/C/E Neuro: CNs intact, motor/sens grossly intact Skin: No lesions noted  DATA As above   IMPRESSION: 1) Moderate persistent asthma with excellent response to Symbicort (also noting recent short course of prednisone) 2) Evanescent pulmonary infiltrates - previously diagnosed with asthma. GGOs on CT chest c/w mild pneumonitis. These could represent pulmonary manifestations of dermatomyositis   PLAN: Continue Symbicort and PRN albuterol Follow up in 6 weeks. If symptomatic control is difficult or if she has repeated pulmonary infiltrates, we might need to consider low dose prednisone and/or rheumatology eval  Merton Border, MD PCCM service Mobile [phone] Pager [phone] 04/25/2016

## 2016-05-04 ENCOUNTER — Encounter: Payer: Self-pay | Admitting: General Surgery

## 2016-05-04 ENCOUNTER — Other Ambulatory Visit: Payer: Self-pay

## 2016-05-04 ENCOUNTER — Ambulatory Visit (INDEPENDENT_AMBULATORY_CARE_PROVIDER_SITE_OTHER): Payer: Medicare Other | Admitting: General Surgery

## 2016-05-04 ENCOUNTER — Other Ambulatory Visit: Payer: Self-pay | Admitting: General Surgery

## 2016-05-04 VITALS — BP 138/74 | HR 78 | Resp 12 | Ht 67.0 in | Wt 198.0 lb

## 2016-05-04 DIAGNOSIS — E041 Nontoxic single thyroid nodule: Secondary | ICD-10-CM

## 2016-05-04 DIAGNOSIS — R896 Abnormal cytological findings in specimens from other organs, systems and tissues: Secondary | ICD-10-CM | POA: Diagnosis not present

## 2016-05-04 NOTE — Progress Notes (Signed)
Patient ID: [name], female   DOB: 1961-06-07, 55 y.o.   MRN: [id number]  Chief Complaint  Patient presents with  . Procedure    FNA thyroid biopsy    HPI [name] is a 55 y.o. female here today for a FNA thyroid biopsy.  She is here today with her husband [name].  HPI  Past Medical History:  Diagnosis Date  . Allergic rhinitis   . Anxiety   . Arthralgia   . Asthma   . Bronchitis, chronic ([place])   . Chronic back pain   . Collagenous colitis    followed by Dr Tiffany Kocher  . Depression   . Diplopia 11/15/2013  . Dysphagia   . Endometriosis   . Fibrocystic breast disease   . GERD (gastroesophageal reflux disease)   . History of colon polyps   . Hyperlipidemia   . Hypertension   . Hypokalemia   . IBS (irritable bowel syndrome)   . Memory difficulty 11/15/2013  . Migraine headache   . Pneumonia 2017   x2  . Sleep apnea   . Ulcer disease   . Urine incontinence     Past Surgical History:  Procedure Laterality Date  . ABDOMINAL HYSTERECTOMY     endometriosis  . BACK SURGERY     x6  . BREAST BIOPSY Right   . COLONOSCOPY WITH PROPOFOL N/A 06/25/2015   Procedure: COLONOSCOPY WITH PROPOFOL;  Surgeon: Manya Silvas, MD;  Location: [facility] ENDOSCOPY;  Service: Endoscopy;  Laterality: N/A;  . DILATION AND CURETTAGE OF UTERUS     and hysteroscopy  . ESOPHAGOGASTRODUODENOSCOPY N/A 06/25/2015   Procedure: ESOPHAGOGASTRODUODENOSCOPY (EGD);  Surgeon: Manya Silvas, MD;  Location: [facility] ENDOSCOPY;  Service: Endoscopy;  Laterality: N/A;  . insertion of permanent spinal cord stimulator    . insertion of trial spinal cord stimulator    . LUMBAR DISC SURGERY    . LUMBAR FUSION  4/05 and 8/07  . LUMBAR MICRODISCECTOMY  03/28/03   L4-5 L5  . NASAL SEPTOPLASTY W/ TURBINOPLASTY    . removal of spinal cord stimulator    . SEPTOPLASTY     with reduction of turbinates    Family History  Problem Relation Age of Onset  . Allergies Mother   . Hypertension Mother   .  Arthritis Mother   . Hyperlipidemia Mother   . Stroke Mother   . Hypertension Father   . Arthritis Father   . Diabetes Father   . Hypertension Brother   . Diabetes Brother   . Arthritis Maternal Grandmother   . Hyperlipidemia Maternal Grandmother   . Hyperlipidemia Maternal Grandfather   . Arthritis Paternal Grandmother   . Arthritis Paternal Grandfather   . Asthma      several family member  . Leukemia      uncle  . Lymphoma Maternal Aunt     non hodgkins    Social History Social History  Substance Use Topics  . Smoking status: Never Smoker  . Smokeless tobacco: Never Used  . Alcohol use No    Allergies  Allergen Reactions  . Buspirone Other (See Comments)    SEVERE HEADACHE  . Buspirone Hcl   . Ciprofloxacin   . Conjugated Estrogens   . Cymbalta [Duloxetine Hcl] Other (See Comments)    Headache  . Doxycycline     REACTION: causes severe abd pain  . Duloxetine Other (See Comments)    Headache  . Erythromycin Ethylsuccinate   . Estradiol   .  Fentanyl   . Metronidazole   . Mirtazapine   . Montelukast Other (See Comments)    SEVERE HEADACHE  . Montelukast Sodium   . Montelukast Sodium   . Oxymorphone     REACTION: severe headaches  . Sumatriptan   . Sumatriptan Succinate Other (See Comments)    Current Outpatient Prescriptions  Medication Sig Dispense Refill  . albuterol (PROVENTIL HFA;VENTOLIN HFA) 108 (90 Base) MCG/ACT inhaler Inhale 2 puffs into the lungs every 4 (four) hours as needed for wheezing or shortness of breath. 1 Inhaler 3  . albuterol (PROVENTIL) (2.5 MG/3ML) 0.083% nebulizer solution Take 2.5 mg by nebulization every 4 (four) hours as needed for wheezing or shortness of breath.    . diazepam (VALIUM) 5 MG tablet Take 5 mg by mouth 3 (three) times daily as needed.      . docusate sodium (COLACE) 100 MG capsule Take 100 mg by mouth 2 (two) times daily.    Marland Kitchen gabapentin (NEURONTIN) 800 MG tablet Take 800 mg by mouth 4 (four) times daily.  2  .  metoprolol (LOPRESSOR) 50 MG tablet TAKE 1/2 TABLET BY MOUTH TWICE A DAY (NEED OFFICE VISIT) 30 tablet 2  . Multiple Vitamin (MULTIVITAMIN) capsule Take 1 capsule by mouth daily.      Marland Kitchen nystatin (NYSTATIN) powder Apply topically 2 (two) times daily. 15 g 0  . nystatin cream (MYCOSTATIN) Apply 1 application topically 2 (two) times daily. 30 g 0  . Omega-3 Fatty Acids (FISH OIL) 1200 MG CAPS Take 1 capsule by mouth daily.    . Oxycodone HCl 10 MG TABS Take 10 mg by mouth 3 (three) times daily as needed.  0  . pantoprazole (PROTONIX) 40 MG tablet Take 1 tablet (40 mg total) by mouth 2 (two) times daily before a meal. 60 tablet 2  . pravastatin (PRAVACHOL) 10 MG tablet TAKE 1 TABLET (10 MG TOTAL) BY MOUTH DAILY. 45 tablet 7  . RELISTOR 150 MG TABS TAKE 3 TABLETS BY MOUTH ONCE A DAY prn  2  . SYMBICORT 80-4.5 MCG/ACT inhaler INHALE 2 PUFFS BY MOUTH TWICE A DAY IN THE MORNING AND EVENING 1 Inhaler 2  . topiramate (TOPAMAX) 50 MG tablet TAKE 2 TABLETS BY MOUTH TWICE A DAY 120 tablet 4  . triamcinolone cream (KENALOG) 0.1 % Apply 1 application topically 2 (two) times daily. Do not use more than 7-10 days in one area 30 g 0  . UNABLE TO FIND Med Name: I Cool 1 daily for menopause    . venlafaxine XR (EFFEXOR-XR) 150 MG 24 hr capsule Take 150 mg by mouth 2 (two) times daily.  11  . venlafaxine XR (EFFEXOR-XR) 75 MG 24 hr capsule Take 75 mg by mouth every morning.  11  . XTAMPZA ER 36 MG C12A TAKE 1 CAPSULE BY MOUTH EVERY 12 HOURS  0  . zolpidem (AMBIEN) 10 MG tablet Take 10 mg by mouth at bedtime as needed.       No current facility-administered medications for this visit.     Review of Systems Review of Systems  Constitutional: Negative.   Respiratory: Negative.   Cardiovascular: Negative.     Blood pressure 138/74, pulse 78, resp. rate 12, height 5\' 7"  (1.702 m), weight 198 lb (89.8 kg).  Physical Exam Physical Exam  Constitutional: She is oriented to person, place, and time. She appears  well-developed and well-nourished.  Neurological: She is alert and oriented to person, place, and time.  Skin: Skin is warm and  dry.  Psychiatric: Her behavior is normal.    Data Reviewed Ultrasound-guided biopsy results and pathology of 03/02/2016 reviewed. Specimen one was the 1.5 cm nodule in the mid body of the thyroid. Specimen 2 was the 1.2 cm nodule in the lower pole of the thyroid with extensive calcifications.  THYROID, FINE NEEDLE ASPIRATION, LMP - LLP (SPECIMEN 1 OF 2, COLLECTED ON 03/02/16): ATYPIA OF UNDETERMINED SIGNIFICANCE OR FOLLICULAR LESION OF UNDETERMINED SIGNIFICANCE (BETHESDA CATEGORY III). SEE COMMENT. COMMENT: THE SPECIMEN IS HYPERCELLULAR AND CONSISTS OF SMALL TO LARGE GROUPS OF FOLLICULAR EPITHELIAL CELLS WITH SOME CYTOLOGIC ATYPIA, INCLUDING INTRANUCLEAR GROOVES, NUCLEAR OVERLAP, AND SOME HURTHLE CELL CHANGES. BASED ON THESE FEATURES, A FOLLICULAR LESION/NEOPLASM CAN NOT BE ENTIRELY RULED OUT.   Examination of the Left Thyroid Lobe Confirmed a 1.1 x 1.40 x 1.46 Cm Hypoechoic Nodule in the Lateral Aspect of the Mid Left Thyroid Lobe. Peripheral Flow Was Noted with Minimal Internal Flow Appreciated. This Corresponded to Nodule One on the Recently Completed FNA by the Radiology Service.  Today's visit was To complete repeat FNA with special sampling with Affirma analysis. 1 mL of 1% Xylocaine was used for local anesthesia. 4 separate samples were obtained throughout the nodule in question, 2 for routine cytopathology at [facility], the additional 2 for specialized testing. The procedure was well tolerated. No bleeding was noted. Skin defect was covered with a Band-Aid.  Assessment    2 left thyroid nodules, one with atypical cytology.    Plan    Patient will be contacted with cytology and special testing is available.  If benign, follow-up exam with office ultrasound in 6 months. If high risk for malignancy will review with the patient indications for  thyroidectomy.    This information has been scribed by Karie Fetch RN, BSN,BC.    Robert Bellow 05/04/2016, 8:39 PM

## 2016-05-04 NOTE — Patient Instructions (Signed)
The patient is aware to call back for any questions or concerns.  

## 2016-05-05 ENCOUNTER — Other Ambulatory Visit: Payer: Self-pay | Admitting: Internal Medicine

## 2016-05-11 DIAGNOSIS — M47817 Spondylosis without myelopathy or radiculopathy, lumbosacral region: Secondary | ICD-10-CM | POA: Diagnosis not present

## 2016-05-11 DIAGNOSIS — G4700 Insomnia, unspecified: Secondary | ICD-10-CM | POA: Diagnosis not present

## 2016-05-11 DIAGNOSIS — G894 Chronic pain syndrome: Secondary | ICD-10-CM | POA: Diagnosis not present

## 2016-05-11 DIAGNOSIS — M62830 Muscle spasm of back: Secondary | ICD-10-CM | POA: Diagnosis not present

## 2016-05-12 ENCOUNTER — Telehealth: Payer: Self-pay | Admitting: *Deleted

## 2016-05-12 NOTE — Telephone Encounter (Signed)
We are still awaiting results on the specialized testing from [place].

## 2016-05-12 NOTE — Telephone Encounter (Signed)
Patient called looking for results from her thyroid on 05/04/16

## 2016-05-23 ENCOUNTER — Ambulatory Visit: Payer: Medicare Other | Admitting: Psychiatry

## 2016-05-24 ENCOUNTER — Ambulatory Visit (INDEPENDENT_AMBULATORY_CARE_PROVIDER_SITE_OTHER): Payer: Medicare Other | Admitting: General Surgery

## 2016-05-24 ENCOUNTER — Encounter: Payer: Self-pay | Admitting: General Surgery

## 2016-05-24 VITALS — BP 140/78 | HR 70 | Resp 14 | Ht 67.0 in | Wt 198.0 lb

## 2016-05-24 DIAGNOSIS — E041 Nontoxic single thyroid nodule: Secondary | ICD-10-CM

## 2016-05-24 NOTE — Patient Instructions (Addendum)
Thyroidectomy A thyroidectomy is a surgery that is done to remove all (total thyroidectomy) or part (subtotal thyroidectomy) of your thyroid gland. The thyroid is a butterfly-shaped gland that is located at the lower front of your neck. It produces a substance that helps to control certain body processes (thyroid hormone). The amount of thyroid gland tissue that is removed during your thyroidectomy depends on the reason you need the procedure. You may have a thyroidectomy to treat conditions including:  Thyroid nodules.  Thyroid cancer.  Benign thyroid tumors.  Goiter.  Overactive thyroid gland (hyperthyroidism). There are different ways to do a thyroidectomy:   Conventional thyroidectomy (open thyroidectomy). This procedure is the most common. In this procedure, the thyroid gland is removed through one surgical cut (incision) in the neck.  Endoscopic thyroidectomy. This procedure is less invasive. In this procedure, there may be several smaller incisions in the neck, chest, or armpit. The surgeon uses a tiny camera and other assistive tools to remove the thyroid gland. LET [facility] CARE PROVIDER KNOW ABOUT:   Any allergies you have.  All medicines you are taking, including vitamins, herbs, eye drops, creams, and over-the-counter medicines.  Previous problems you or members of your family have had with the use of anesthetics.  Any blood disorders you have.  Previous surgeries you have had.  Medical conditions you have. RISKS AND COMPLICATIONS Generally, this is a safe procedure. However, problems can occur and include:  A decrease in parathyroid hormone levels (hypoparathyroidism). Your parathyroid glands are located behind your thyroid gland, and they maintain the calcium level in your body. If these glands are damaged during surgery, your calcium level will drop. This will make your nerves irritable and cause muscle spasms.  An increase in thyroid hormone.  Damage to the  nerves of your voice box (larynx).  Bleeding.  Infection at the site of the incision or incisions.  Temporary breathing difficulties. This is a very rare complication. It usually goes away within weeks. BEFORE THE PROCEDURE  Your health care provider will perform a physical exam and assess your voice for vocal changes.  Ask your health care provider about:  Changing or stopping your regular medicines. This is especially important if you are taking diabetes medicines or blood thinners.  Taking medicines such as aspirin and ibuprofen. These medicines can thin your blood. Do not take these medicines before your procedure if your health care provider instructs you not to.  Follow instructions from your health care provider about eating or drinking restrictions. PROCEDURE You will be given a medicine that makes you go to sleep (general anesthetic). Depending on which type of thyroidectomy you have, this is what may happen during the procedure: Conventional Thyroidectomy  The surgeon will make an incision in the center of your lower neck.  The muscles in your neck will be separated to reveal your thyroid gland.  Part or all of your thyroid gland will be removed.  You may need a tube (catheter) at the incision site to drain blood and fluids that accumulate under the skin after the procedure.The catheter may have to stay in place for a day or two after the procedure.  The incision will be closed with stitches (sutures). Endoscopic Thyroidectomy  The surgeon will make several small incisions in your neck, chest, or armpit.  The surgeon will use a narrow tube with a light and camera at the end (endoscope). The surgeon will insert the endoscope into an incision.  Part or all of your thyroid  gland will be removed.  You may need a catheter at the incision site to drain blood and fluids that accumulate under the skin after the procedure.The catheter may have to stay in place for a day or  two after the procedure.  The incision will be closed with sutures. AFTER THE PROCEDURE  Your blood pressure, heart rate, breathing rate, and blood oxygen level will be monitored often until the medicines you were given have worn off.  Depending on the type of thyroidectomy you had, you may have:  A swollen neck.  Some mild neck pain.  A slightly sore throat.  A weak voice.  You will not be able to eat or drink until your health care provider says it is okay.  You may have a blood test to check the level of calcium in your body.  If you had a catheter put in during the procedure, it will usually be removed the next day.   This information is not intended to replace advice given to you by your health care provider. Make sure you discuss any questions you have with your health care provider.   Document Released: 01/25/2001 Document Revised: 12/16/2014 Document Reviewed: 01/01/2014 Elsevier Interactive Patient Education [employer].  The patient is scheduled for surgery at [facility] on 06/06/16. She will pre admit at the hospital on 05/30/16. The patient is aware of dates, time, and instructions.

## 2016-05-24 NOTE — Progress Notes (Signed)
Patient ID: [name], female   DOB: 10/31/1960, 55 y.o.   MRN: [id number]  Chief Complaint  Patient presents with  . Other    HPI [name] is a 55 y.o. female here today to discuss results from FNA thyroid biopsy done on 05/04/16.  HPI  Past Medical History:  Diagnosis Date  . Allergic rhinitis   . Anxiety   . Arthralgia   . Asthma   . Bronchitis, chronic ([place])   . Chronic back pain   . Collagenous colitis    followed by Dr Tiffany Kocher  . Depression   . Diplopia 11/15/2013  . Dysphagia   . Endometriosis   . Fibrocystic breast disease   . GERD (gastroesophageal reflux disease)   . History of colon polyps   . Hyperlipidemia   . Hypertension   . Hypokalemia   . IBS (irritable bowel syndrome)   . Memory difficulty 11/15/2013  . Migraine headache   . Pneumonia 2017   x2  . Sleep apnea   . Ulcer disease   . Urine incontinence     Past Surgical History:  Procedure Laterality Date  . ABDOMINAL HYSTERECTOMY     endometriosis  . BACK SURGERY     x6  . BREAST BIOPSY Right   . COLONOSCOPY WITH PROPOFOL N/A 06/25/2015   Procedure: COLONOSCOPY WITH PROPOFOL;  Surgeon: Manya Silvas, MD;  Location: [facility] ENDOSCOPY;  Service: Endoscopy;  Laterality: N/A;  . DILATION AND CURETTAGE OF UTERUS     and hysteroscopy  . ESOPHAGOGASTRODUODENOSCOPY N/A 06/25/2015   Procedure: ESOPHAGOGASTRODUODENOSCOPY (EGD);  Surgeon: Manya Silvas, MD;  Location: [facility] ENDOSCOPY;  Service: Endoscopy;  Laterality: N/A;  . insertion of permanent spinal cord stimulator    . insertion of trial spinal cord stimulator    . LUMBAR DISC SURGERY    . LUMBAR FUSION  4/05 and 8/07  . LUMBAR MICRODISCECTOMY  03/28/03   L4-5 L5  . NASAL SEPTOPLASTY W/ TURBINOPLASTY    . removal of spinal cord stimulator    . SEPTOPLASTY     with reduction of turbinates    Family History  Problem Relation Age of Onset  . Allergies Mother   . Hypertension Mother   . Arthritis Mother   . Hyperlipidemia Mother   .  Stroke Mother   . Hypertension Father   . Arthritis Father   . Diabetes Father   . Hypertension Brother   . Diabetes Brother   . Arthritis Maternal Grandmother   . Hyperlipidemia Maternal Grandmother   . Hyperlipidemia Maternal Grandfather   . Arthritis Paternal Grandmother   . Arthritis Paternal Grandfather   . Asthma      several family member  . Leukemia      uncle  . Lymphoma Maternal Aunt     non hodgkins    Social History Social History  Substance Use Topics  . Smoking status: Never Smoker  . Smokeless tobacco: Never Used  . Alcohol use No    Allergies  Allergen Reactions  . Buspirone Other (See Comments)    SEVERE HEADACHE  . Buspirone Hcl   . Ciprofloxacin   . Conjugated Estrogens   . Cymbalta [Duloxetine Hcl] Other (See Comments)    Headache  . Doxycycline     REACTION: causes severe abd pain  . Duloxetine Other (See Comments)    Headache  . Erythromycin Ethylsuccinate   . Estradiol   . Fentanyl   . Metronidazole   . Mirtazapine   .  Montelukast Other (See Comments)    SEVERE HEADACHE  . Montelukast Sodium   . Montelukast Sodium   . Oxymorphone     REACTION: severe headaches  . Sumatriptan   . Sumatriptan Succinate Other (See Comments)    Current Outpatient Prescriptions  Medication Sig Dispense Refill  . albuterol (PROVENTIL HFA;VENTOLIN HFA) 108 (90 Base) MCG/ACT inhaler Inhale 2 puffs into the lungs every 4 (four) hours as needed for wheezing or shortness of breath. 1 Inhaler 3  . albuterol (PROVENTIL) (2.5 MG/3ML) 0.083% nebulizer solution Take 2.5 mg by nebulization every 4 (four) hours as needed for wheezing or shortness of breath.    . diazepam (VALIUM) 5 MG tablet Take 5 mg by mouth 3 (three) times daily as needed.      . docusate sodium (COLACE) 100 MG capsule Take 100 mg by mouth 2 (two) times daily.    Marland Kitchen gabapentin (NEURONTIN) 800 MG tablet Take 800 mg by mouth 4 (four) times daily.  2  . metoprolol (LOPRESSOR) 50 MG tablet TAKE 1/2  TABLET BY MOUTH TWICE A DAY (NEED OFFICE VISIT) 30 tablet 2  . Multiple Vitamin (MULTIVITAMIN) capsule Take 1 capsule by mouth daily.      Marland Kitchen nystatin (NYSTATIN) powder Apply topically 2 (two) times daily. 15 g 0  . nystatin cream (MYCOSTATIN) Apply 1 application topically 2 (two) times daily. 30 g 0  . Omega-3 Fatty Acids (FISH OIL) 1200 MG CAPS Take 1 capsule by mouth daily.    . Oxycodone HCl 10 MG TABS Take 10 mg by mouth 3 (three) times daily as needed.  0  . pantoprazole (PROTONIX) 40 MG tablet Take 1 tablet (40 mg total) by mouth 2 (two) times daily before a meal. 60 tablet 2  . pantoprazole (PROTONIX) 40 MG tablet TAKE 1 TABLET BY MOUTH EVERY DAY 30 tablet 3  . pravastatin (PRAVACHOL) 10 MG tablet TAKE 1 TABLET (10 MG TOTAL) BY MOUTH DAILY. 45 tablet 7  . RELISTOR 150 MG TABS TAKE 3 TABLETS BY MOUTH ONCE A DAY prn  2  . SYMBICORT 80-4.5 MCG/ACT inhaler INHALE 2 PUFFS BY MOUTH TWICE A DAY IN THE MORNING AND EVENING 1 Inhaler 2  . topiramate (TOPAMAX) 50 MG tablet TAKE 2 TABLETS BY MOUTH TWICE A DAY 120 tablet 4  . triamcinolone cream (KENALOG) 0.1 % Apply 1 application topically 2 (two) times daily. Do not use more than 7-10 days in one area 30 g 0  . UNABLE TO FIND Med Name: I Cool 1 daily for menopause    . venlafaxine XR (EFFEXOR-XR) 150 MG 24 hr capsule Take 150 mg by mouth 2 (two) times daily.  11  . venlafaxine XR (EFFEXOR-XR) 75 MG 24 hr capsule Take 75 mg by mouth every morning.  11  . XTAMPZA ER 36 MG C12A TAKE 1 CAPSULE BY MOUTH EVERY 12 HOURS  0  . zolpidem (AMBIEN) 10 MG tablet Take 10 mg by mouth at bedtime as needed.       No current facility-administered medications for this visit.     Review of Systems Review of Systems  Constitutional: Negative.   Respiratory: Negative.   Cardiovascular: Negative.     Blood pressure 140/78, pulse 70, resp. rate 14, height 5\' 7"  (1.702 m), weight 198 lb (89.8 kg).  Physical Exam Physical Exam  Constitutional: She is oriented to  person, place, and time. She appears well-developed and well-nourished.  Eyes: Conjunctivae are normal. No scleral icterus.  Neck: Neck supple.  Cardiovascular: Normal rate, regular rhythm and normal heart sounds.   Pulmonary/Chest: Effort normal and breath sounds normal.  Neurological: She is alert and oriented to person, place, and time.  Skin: Skin is warm and dry.    Data Reviewed Afirma testing of the thyroid nodule was listed as suspicious, negative for high likelihood of medullary carcinoma.  Last serum calcium 9.2 in January 2017. Unchanged over the last 3 determinations.  Assessment    Left thyroid nodule, suspicious on FNA/DNA testing.    Plan    Options for management were reviewed: 1) left thyroid lobectomy which would lower her risk for recurrent laryngeal nerve injury versus 2) total thyroidectomy which would make reexploration unnecessary if final testing showed malignancy.  The patient reports that she likes dissecting, and while she does not get the opportunity to do so as much she she has in the past, this is still something she enjoys.  We reviewed the risks of recurrent laryngeal nerve injury as well as hypo-calcium area with parathyroid disturbance.  Between now and the time of surgery she'll decide how she would like to proceed: Unilateral versus total thyroidectomy.    Patient to be scheduled for thyroidectomy.   The patient is scheduled for surgery at [facility] on 06/06/16. She will pre admit at the hospital on 05/30/16. The patient is aware of dates, time, and instructions.   This information has been scribed by Gaspar Cola CMA.  Robert Bellow 05/24/2016, 4:06 PM

## 2016-05-26 ENCOUNTER — Encounter: Payer: Self-pay | Admitting: *Deleted

## 2016-05-30 ENCOUNTER — Encounter
Admission: RE | Admit: 2016-05-30 | Discharge: 2016-05-30 | Disposition: A | Discharge status: Home / Self Care | Payer: Medicare Other | Source: Ambulatory Visit | Attending: General Surgery | Admitting: General Surgery

## 2016-05-30 DIAGNOSIS — I10 Essential (primary) hypertension: Secondary | ICD-10-CM | POA: Insufficient documentation

## 2016-05-30 DIAGNOSIS — Z01812 Encounter for preprocedural laboratory examination: Secondary | ICD-10-CM | POA: Diagnosis not present

## 2016-05-30 DIAGNOSIS — R9431 Abnormal electrocardiogram [ECG] [EKG]: Secondary | ICD-10-CM | POA: Insufficient documentation

## 2016-05-30 DIAGNOSIS — R000 Tachycardia, unspecified: Secondary | ICD-10-CM | POA: Diagnosis not present

## 2016-05-30 DIAGNOSIS — Z01818 Encounter for other preprocedural examination: Secondary | ICD-10-CM | POA: Diagnosis not present

## 2016-05-30 HISTORY — DX: Other specified postprocedural states: R11.2

## 2016-05-30 HISTORY — DX: Other specified postprocedural states: Z98.890

## 2016-05-30 LAB — BASIC METABOLIC PANEL
Anion gap: 5 (ref 5–15)
BUN: 12 mg/dL (ref 6–20)
CO2: 32 mmol/L (ref 22–32)
Calcium: 9.1 mg/dL (ref 8.9–10.3)
Chloride: 105 mmol/L (ref 101–111)
Creatinine, Ser: 0.71 mg/dL (ref 0.44–1.00)
GFR calc Af Amer: >60 mL/min (ref >60)
GFR calc non Af Amer: >60 mL/min (ref >60)
Glucose, Bld: 91 mg/dL (ref 65–99)
Potassium: 4 mmol/L (ref 3.5–5.1)
Sodium: 142 mmol/L (ref 135–145)

## 2016-05-30 LAB — CBC
HCT: 38.7 % (ref 35.0–47.0)
Hemoglobin: 13.5 g/dL (ref 12.0–16.0)
MCH: 30.1 pg (ref 26.0–34.0)
MCHC: 34.9 g/dL (ref 32.0–36.0)
MCV: 86.2 fL (ref 80.0–100.0)
Platelets: 222 10*3/uL (ref 150–440)
RBC: 4.49 MIL/uL (ref 3.80–5.20)
RDW: 12.7 % (ref 11.5–14.5)
WBC: 6.4 10*3/uL (ref 3.6–11.0)

## 2016-05-30 NOTE — Patient Instructions (Addendum)
  Your procedure is scheduled on:06/06/16 Monday Report to Day Surgery. [facility]) To find out your arrival time please call [phone] between 1PM - 3PM on Friday 06/03/16  Remember: Instructions that are not followed completely may result in serious medical risk, up to and including death, or upon the discretion of your surgeon and anesthesiologist your surgery may need to be rescheduled.    _x___ 1. Do not eat food or drink liquids after midnight. No gum chewing or hard candies.     _x___ 2. No Alcohol for 24 hours before or after surgery.   ____ 3. Do Not Smoke For 24 Hours Prior to Your Surgery.   ____ 4. Bring all medications with you on the day of surgery if instructed.    __x__ 5. Notify your doctor if there is any change in your medical condition     (cold, fever, infections).       Do not wear jewelry, make-up, hairpins, clips or nail polish.  Do not wear lotions, powders, or perfumes. You may wear deodorant.  Do not shave 48 hours prior to surgery. Men may shave face and neck.  Do not bring valuables to the hospital.    [facility] is not responsible for any belongings or valuables.               Contacts, dentures or bridgework may not be worn into surgery.  Leave your suitcase in the car. After surgery it may be brought to your room.  For patients admitted to the hospital, discharge time is determined by your                treatment team.   Patients discharged the day of surgery will not be allowed to drive home.   Please read over the following fact sheets that you were given:      _x___ Take these medicines the morning of surgery with A SIP OF WATER:    1. diazepam (VALIUM) 5 MG tablet (as needed)  2. gabapentin (NEURONTIN) 800 MG tablet  3.metoprolol (LOPRESSOR) 50 MG tablet              4.pantoprazole (PROTONIX) 40 MG tablet  5.venlafaxine (EFFEXOR) 75 MG tablet venlafaxine XR (EFFEXOR-XR) 150 MG 24 hr capsule  6.XTAMPZA ER 36 MG C12A  ____ Fleet  Enema (as directed)   _x___ Use CHG Soap as directed  _x___ Use inhalers on the day of surgery bring proventil with you  ____ Stop metformin 2 days prior to surgery    ____ Take 1/2 of usual insulin dose the night before surgery and none on the morning of surgery.   ____ Stop Coumadin/Plavix/aspirin on   ____ Stop Anti-inflammatories on    ___x_ Stop supplements until after surgery.  Omega-3 Fatty Acids (FISH OIL) 1200 MG CAPS, ICOOL  ____ Bring C-Pap to the hospital.

## 2016-06-02 ENCOUNTER — Telehealth: Payer: Self-pay | Admitting: *Deleted

## 2016-06-02 DIAGNOSIS — J0121 Acute recurrent ethmoidal sinusitis: Secondary | ICD-10-CM

## 2016-06-02 MED ORDER — TRIAMCINOLONE ACETONIDE 55 MCG/ACT NA AERO: 2.0000 | INHALATION_SPRAY | Freq: Two times a day (BID) | NASAL | 12 refills | Status: DC

## 2016-06-02 MED ORDER — AZITHROMYCIN 250 MG PO TABS: ORAL_TABLET | ORAL | 0 refills | Status: AC

## 2016-06-02 NOTE — Telephone Encounter (Signed)
New sinus infection (frequent in fall) with headache, frontal pressure and green nasal drainage. Has responded well to Nasocort and Z pak in the past., Aware surgery may be cancelled on Oct 23 if still symptomatic.

## 2016-06-02 NOTE — Telephone Encounter (Signed)
Patient called stating she feels like she may have a sinus infection. State she is having a lot of green drainage, denies fever. She is not taking any antibiotics, has not seen a doctor yet. Scheduled for thyroidectomy 06/06/16.

## 2016-06-03 ENCOUNTER — Ambulatory Visit: Payer: Managed Care, Other (non HMO) | Admitting: Pulmonary Disease

## 2016-06-06 ENCOUNTER — Encounter
Admission: RE | Disposition: A | Discharge status: Home / Self Care | Payer: Self-pay | Source: Ambulatory Visit | Attending: General Surgery

## 2016-06-06 ENCOUNTER — Observation Stay
Admission: RE | Admit: 2016-06-06 | Discharge: 2016-06-07 | Disposition: A | Discharge status: Home / Self Care | Payer: Medicare Other | Source: Ambulatory Visit | Attending: General Surgery | Admitting: General Surgery

## 2016-06-06 ENCOUNTER — Ambulatory Visit: Payer: Medicare Other | Admitting: Anesthesiology

## 2016-06-06 DIAGNOSIS — I10 Essential (primary) hypertension: Secondary | ICD-10-CM | POA: Diagnosis not present

## 2016-06-06 DIAGNOSIS — D34 Benign neoplasm of thyroid gland: Secondary | ICD-10-CM | POA: Diagnosis not present

## 2016-06-06 DIAGNOSIS — K219 Gastro-esophageal reflux disease without esophagitis: Secondary | ICD-10-CM | POA: Diagnosis not present

## 2016-06-06 DIAGNOSIS — E041 Nontoxic single thyroid nodule: Secondary | ICD-10-CM | POA: Diagnosis not present

## 2016-06-06 DIAGNOSIS — Z79899 Other long term (current) drug therapy: Secondary | ICD-10-CM | POA: Diagnosis not present

## 2016-06-06 DIAGNOSIS — G4730 Sleep apnea, unspecified: Secondary | ICD-10-CM | POA: Insufficient documentation

## 2016-06-06 DIAGNOSIS — E063 Autoimmune thyroiditis: Secondary | ICD-10-CM | POA: Insufficient documentation

## 2016-06-06 DIAGNOSIS — E065 Other chronic thyroiditis: Secondary | ICD-10-CM | POA: Diagnosis not present

## 2016-06-06 DIAGNOSIS — D497 Neoplasm of unspecified behavior of endocrine glands and other parts of nervous system: Secondary | ICD-10-CM | POA: Diagnosis present

## 2016-06-06 DIAGNOSIS — Z23 Encounter for immunization: Secondary | ICD-10-CM | POA: Insufficient documentation

## 2016-06-06 DIAGNOSIS — F418 Other specified anxiety disorders: Secondary | ICD-10-CM | POA: Diagnosis not present

## 2016-06-06 DIAGNOSIS — F329 Major depressive disorder, single episode, unspecified: Secondary | ICD-10-CM | POA: Diagnosis not present

## 2016-06-06 DIAGNOSIS — Z885 Allergy status to narcotic agent status: Secondary | ICD-10-CM | POA: Insufficient documentation

## 2016-06-06 DIAGNOSIS — F419 Anxiety disorder, unspecified: Secondary | ICD-10-CM | POA: Diagnosis not present

## 2016-06-06 DIAGNOSIS — J45909 Unspecified asthma, uncomplicated: Secondary | ICD-10-CM | POA: Diagnosis not present

## 2016-06-06 HISTORY — PX: THYROIDECTOMY: SHX17

## 2016-06-06 LAB — CALCIUM: Calcium: 7.9 mg/dL — ABNORMAL LOW (ref 8.9–10.3)

## 2016-06-06 SURGERY — THYROIDECTOMY
Anesthesia: General | Wound class: Clean Contaminated

## 2016-06-06 MED ORDER — GABAPENTIN 400 MG PO CAPS
800.0000 mg | ORAL_CAPSULE | Freq: Four times a day (QID) | ORAL | Status: DC
  Administered 2016-06-06 – 2016-06-07 (×4): 800 mg via ORAL
  Filled 2016-06-06 (×4): qty 2

## 2016-06-06 MED ORDER — SCOPOLAMINE 1 MG/3DAYS TD PT72
1.0000 | MEDICATED_PATCH | Freq: Once | TRANSDERMAL | Status: DC
  Administered 2016-06-06: 1.5 mg via TRANSDERMAL

## 2016-06-06 MED ORDER — PROMETHAZINE HCL 25 MG/ML IJ SOLN
12.5000 mg | INTRAMUSCULAR | Status: DC | PRN
  Administered 2016-06-06: 12.5 mg via INTRAVENOUS
  Filled 2016-06-06: qty 1

## 2016-06-06 MED ORDER — MORPHINE SULFATE (PF) 2 MG/ML IV SOLN: 2.0000 mg | INTRAVENOUS | Status: DC | PRN

## 2016-06-06 MED ORDER — GABAPENTIN 800 MG PO TABS
800.0000 mg | ORAL_TABLET | Freq: Four times a day (QID) | ORAL | Status: DC
  Filled 2016-06-06: qty 1

## 2016-06-06 MED ORDER — HYDROMORPHONE HCL 1 MG/ML IJ SOLN
INTRAMUSCULAR | Status: AC
  Filled 2016-06-06: qty 1

## 2016-06-06 MED ORDER — METOPROLOL TARTRATE 50 MG PO TABS
50.0000 mg | ORAL_TABLET | Freq: Every day | ORAL | Status: DC
  Administered 2016-06-07: 50 mg via ORAL
  Filled 2016-06-06: qty 1

## 2016-06-06 MED ORDER — HYDROMORPHONE HCL 2 MG PO TABS
1.0000 mg | ORAL_TABLET | ORAL | Status: DC | PRN
  Administered 2016-06-06 – 2016-06-07 (×3): 1 mg via ORAL
  Filled 2016-06-06 (×3): qty 1

## 2016-06-06 MED ORDER — INFLUENZA VAC SPLIT QUAD 0.5 ML IM SUSY
0.5000 mL | PREFILLED_SYRINGE | INTRAMUSCULAR | Status: AC
  Administered 2016-06-07: 0.5 mL via INTRAMUSCULAR
  Filled 2016-06-06: qty 0.5

## 2016-06-06 MED ORDER — HYDROMORPHONE HCL 1 MG/ML IJ SOLN
0.2500 mg | INTRAMUSCULAR | Status: DC | PRN
Start: 2016-06-06 — End: 2016-06-06
  Administered 2016-06-06 (×4): 0.5 mg via INTRAVENOUS

## 2016-06-06 MED ORDER — MIDAZOLAM HCL 2 MG/2ML IJ SOLN
INTRAMUSCULAR | Status: DC | PRN
  Administered 2016-06-06: 2 mg via INTRAVENOUS

## 2016-06-06 MED ORDER — PROPOFOL 10 MG/ML IV BOLUS
INTRAVENOUS | Status: DC | PRN
  Administered 2016-06-06: 180 mg via INTRAVENOUS

## 2016-06-06 MED ORDER — DIAZEPAM 5 MG PO TABS: 5.0000 mg | ORAL_TABLET | Freq: Three times a day (TID) | ORAL | Status: DC | PRN

## 2016-06-06 MED ORDER — ADULT MULTIVITAMIN W/MINERALS CH
1.0000 | ORAL_TABLET | Freq: Every day | ORAL | Status: DC
  Administered 2016-06-07: 1 via ORAL
  Filled 2016-06-06 (×2): qty 1

## 2016-06-06 MED ORDER — SUCCINYLCHOLINE CHLORIDE 20 MG/ML IJ SOLN
INTRAMUSCULAR | Status: DC | PRN
  Administered 2016-06-06: 100 mg via INTRAVENOUS

## 2016-06-06 MED ORDER — BACITRACIN ZINC 500 UNIT/GM EX OINT
TOPICAL_OINTMENT | CUTANEOUS | Status: AC
  Filled 2016-06-06: qty 0.9

## 2016-06-06 MED ORDER — VENLAFAXINE HCL 75 MG PO TABS: 75.0000 mg | ORAL_TABLET | Freq: Once | ORAL | Status: DC

## 2016-06-06 MED ORDER — OXYCODONE HCL 5 MG PO TABS: 10.0000 mg | ORAL_TABLET | Freq: Three times a day (TID) | ORAL | Status: DC | PRN

## 2016-06-06 MED ORDER — ALBUTEROL SULFATE HFA 108 (90 BASE) MCG/ACT IN AERS: 2.0000 | INHALATION_SPRAY | RESPIRATORY_TRACT | Status: DC | PRN

## 2016-06-06 MED ORDER — ALBUTEROL SULFATE (2.5 MG/3ML) 0.083% IN NEBU: 2.5000 mg | INHALATION_SOLUTION | RESPIRATORY_TRACT | Status: DC | PRN

## 2016-06-06 MED ORDER — LIDOCAINE HCL (CARDIAC) 20 MG/ML IV SOLN
INTRAVENOUS | Status: DC | PRN
  Administered 2016-06-06: 80 mg via INTRAVENOUS

## 2016-06-06 MED ORDER — BACITRACIN 500 UNIT/GM EX OINT
TOPICAL_OINTMENT | CUTANEOUS | Status: DC | PRN
  Administered 2016-06-06: 1 via TOPICAL

## 2016-06-06 MED ORDER — LACTATED RINGERS IV SOLN
INTRAVENOUS | Status: DC
  Administered 2016-06-06: 16:00:00 via INTRAVENOUS

## 2016-06-06 MED ORDER — LACTATED RINGERS IV SOLN
INTRAVENOUS | Status: DC
  Administered 2016-06-06: 07:00:00 via INTRAVENOUS

## 2016-06-06 MED ORDER — SCOPOLAMINE 1 MG/3DAYS TD PT72
MEDICATED_PATCH | TRANSDERMAL | Status: AC
  Administered 2016-06-06: 1.5 mg via TRANSDERMAL
  Filled 2016-06-06: qty 1

## 2016-06-06 MED ORDER — ACETAMINOPHEN 10 MG/ML IV SOLN
INTRAVENOUS | Status: AC
  Filled 2016-06-06: qty 100

## 2016-06-06 MED ORDER — TOPIRAMATE 25 MG PO TABS
100.0000 mg | ORAL_TABLET | Freq: Two times a day (BID) | ORAL | Status: DC
  Administered 2016-06-06 – 2016-06-07 (×3): 100 mg via ORAL
  Filled 2016-06-06 (×3): qty 4

## 2016-06-06 MED ORDER — HYDROMORPHONE HCL 1 MG/ML IJ SOLN
INTRAMUSCULAR | Status: DC | PRN
  Administered 2016-06-06: 0.5 mg via INTRAVENOUS

## 2016-06-06 MED ORDER — OXYCODONE HCL ER 40 MG PO T12A
40.0000 mg | EXTENDED_RELEASE_TABLET | Freq: Two times a day (BID) | ORAL | Status: DC
  Administered 2016-06-06 – 2016-06-07 (×2): 40 mg via ORAL
  Filled 2016-06-06 (×2): qty 1

## 2016-06-06 MED ORDER — ACETAMINOPHEN 10 MG/ML IV SOLN
INTRAVENOUS | Status: DC | PRN
  Administered 2016-06-06: 1000 mg via INTRAVENOUS

## 2016-06-06 MED ORDER — CALCIUM CARBONATE-VITAMIN D 500-200 MG-UNIT PO TABS
2.0000 | ORAL_TABLET | Freq: Three times a day (TID) | ORAL | Status: DC
  Administered 2016-06-06 – 2016-06-07 (×2): 2 via ORAL
  Filled 2016-06-06 (×2): qty 2

## 2016-06-06 MED ORDER — BUPIVACAINE-EPINEPHRINE (PF) 0.5% -1:200000 IJ SOLN
INTRAMUSCULAR | Status: AC
Start: 2016-06-06 — End: 2016-06-06
  Filled 2016-06-06: qty 30

## 2016-06-06 MED ORDER — PNEUMOCOCCAL VAC POLYVALENT 25 MCG/0.5ML IJ INJ
0.5000 mL | INJECTION | INTRAMUSCULAR | Status: AC
  Administered 2016-06-07: 0.5 mL via INTRAMUSCULAR
  Filled 2016-06-06: qty 0.5

## 2016-06-06 MED ORDER — ONDANSETRON HCL 4 MG/2ML IJ SOLN: 4.0000 mg | Freq: Once | INTRAMUSCULAR | Status: DC | PRN

## 2016-06-06 MED ORDER — DOCUSATE SODIUM 100 MG PO CAPS
100.0000 mg | ORAL_CAPSULE | Freq: Two times a day (BID) | ORAL | Status: DC
  Administered 2016-06-06 – 2016-06-07 (×3): 100 mg via ORAL
  Filled 2016-06-06 (×3): qty 1

## 2016-06-06 MED ORDER — HYDROCODONE-ACETAMINOPHEN 5-325 MG PO TABS: 1.0000 | ORAL_TABLET | ORAL | Status: DC | PRN

## 2016-06-06 MED ORDER — PANTOPRAZOLE SODIUM 40 MG PO TBEC
40.0000 mg | DELAYED_RELEASE_TABLET | Freq: Every day | ORAL | Status: DC
  Administered 2016-06-07: 40 mg via ORAL
  Filled 2016-06-06: qty 1

## 2016-06-06 MED ORDER — BUDESONIDE-FORMOTEROL FUMARATE 80-4.5 MCG/ACT IN AERO
2.0000 | INHALATION_SPRAY | Freq: Two times a day (BID) | RESPIRATORY_TRACT | Status: DC
  Administered 2016-06-06 – 2016-06-07 (×2): 2 via RESPIRATORY_TRACT
  Filled 2016-06-06: qty 6.9

## 2016-06-06 MED ORDER — DEXAMETHASONE SODIUM PHOSPHATE 10 MG/ML IJ SOLN
INTRAMUSCULAR | Status: DC | PRN
  Administered 2016-06-06: 4 mg via INTRAVENOUS

## 2016-06-06 MED ORDER — VENLAFAXINE HCL ER 75 MG PO CP24: 300.0000 mg | ORAL_CAPSULE | Freq: Once | ORAL | Status: DC

## 2016-06-06 MED ORDER — ACETAMINOPHEN 650 MG RE SUPP: 650.0000 mg | Freq: Four times a day (QID) | RECTAL | Status: DC | PRN

## 2016-06-06 MED ORDER — ACETAMINOPHEN 325 MG PO TABS: 650.0000 mg | ORAL_TABLET | Freq: Four times a day (QID) | ORAL | Status: DC | PRN

## 2016-06-06 MED ORDER — BUPIVACAINE-EPINEPHRINE (PF) 0.5% -1:200000 IJ SOLN
INTRAMUSCULAR | Status: DC | PRN
  Administered 2016-06-06: 10 mL

## 2016-06-06 MED ORDER — ONDANSETRON HCL 4 MG/2ML IJ SOLN
INTRAMUSCULAR | Status: DC | PRN
  Administered 2016-06-06: 4 mg via INTRAVENOUS

## 2016-06-06 MED ORDER — ZOLPIDEM TARTRATE 5 MG PO TABS
10.0000 mg | ORAL_TABLET | Freq: Every evening | ORAL | Status: DC | PRN
  Administered 2016-06-06: 10 mg via ORAL
  Filled 2016-06-06: qty 2

## 2016-06-06 SURGICAL SUPPLY — 45 items
BLADE SURG 15 STRL SS SAFETY (BLADE) ×2 IMPLANT
CANISTER SUCT 1200ML W/VALVE (MISCELLANEOUS) ×2 IMPLANT
CHLORAPREP W/TINT 26ML (MISCELLANEOUS) ×2 IMPLANT
DRAPE LAPAROTOMY TRNSV 106X77 (MISCELLANEOUS) ×2 IMPLANT
DRAPE MAG INST 16X20 L/F (DRAPES) ×2 IMPLANT
DRAPE POUCH INSTRU U-SHP 10X18 (DRAPES) ×1 IMPLANT
DRAPE SHEET LG 3/4 BI-LAMINATE (DRAPES) ×1 IMPLANT
DRSG TEGADERM 4X4.75 (GAUZE/BANDAGES/DRESSINGS) ×2 IMPLANT
DRSG TELFA 3X8 NADH (GAUZE/BANDAGES/DRESSINGS) ×2 IMPLANT
ELECT REM PT RETURN 9FT ADLT (ELECTROSURGICAL) ×2
ELECTRODE REM PT RTRN 9FT ADLT (ELECTROSURGICAL) ×1 IMPLANT
GLOVE BIO SURGEON STRL SZ7.5 (GLOVE) ×7 IMPLANT
GLOVE INDICATOR 8.0 STRL GRN (GLOVE) ×6 IMPLANT
GOWN STRL REUS W/ TWL LRG LVL3 (GOWN DISPOSABLE) ×3 IMPLANT
GOWN STRL REUS W/TWL LRG LVL3 (GOWN DISPOSABLE) ×6
HARMONIC SCALPEL FOCUS (MISCELLANEOUS) ×2 IMPLANT
KIT RM TURNOVER STRD PROC AR (KITS) ×2 IMPLANT
LABEL OR SOLS (LABEL) ×2 IMPLANT
NEEDLE HYPO 22GX1.5 SAFETY (NEEDLE) ×1 IMPLANT
NS IRRIG 500ML POUR BTL (IV SOLUTION) ×2 IMPLANT
PACK BASIN MINOR ARMC (MISCELLANEOUS) ×2 IMPLANT
PAD DRESSING TELFA 3X8 NADH (GAUZE/BANDAGES/DRESSINGS) ×1 IMPLANT
SET COLLECT BLD 23GX7 (MISCELLANEOUS) ×1 IMPLANT
SET COLLECT BLD 25X.75 ORG (MISCELLANEOUS) ×1 IMPLANT
SPONGE KITTNER 5P (MISCELLANEOUS) ×2 IMPLANT
STRIP CLOSURE SKIN 1/2X4 (GAUZE/BANDAGES/DRESSINGS) ×2 IMPLANT
SUCTION FRAZIER HANDLE 10FR (MISCELLANEOUS)
SUCTION TUBE FRAZIER 10FR DISP (MISCELLANEOUS) ×1 IMPLANT
SUT ETH BLK MONO 3 0 FS 1 12/B (SUTURE) ×1 IMPLANT
SUT ETHILON 3-0 FS-10 30 BLK (SUTURE) ×2
SUT SILK 2 0 (SUTURE) ×2
SUT SILK 2-0 18XBRD TIE 12 (SUTURE) ×1 IMPLANT
SUT SILK 3 0 (SUTURE) ×2
SUT SILK 3-0 (SUTURE) ×2 IMPLANT
SUT SILK 3-0 18XBRD TIE 12 (SUTURE) ×1 IMPLANT
SUT SILK 4 0 (SUTURE) ×2
SUT SILK 4-0 18XBRD TIE 12 (SUTURE) ×1 IMPLANT
SUT VIC AB 3-0 54X BRD REEL (SUTURE) ×1 IMPLANT
SUT VIC AB 3-0 BRD 54 (SUTURE) ×2
SUT VIC AB 3-0 SH 27 (SUTURE) ×4
SUT VIC AB 3-0 SH 27X BRD (SUTURE) ×1 IMPLANT
SUT VIC AB 4-0 FS2 27 (SUTURE) ×2 IMPLANT
SUTURE EHLN 3-0 FS-10 30 BLK (SUTURE) ×1 IMPLANT
SWABSTK COMLB BENZOIN TINCTURE (MISCELLANEOUS) ×2 IMPLANT
SYR BULB IRRIG 60ML STRL (SYRINGE) ×2 IMPLANT

## 2016-06-06 NOTE — Anesthesia Preprocedure Evaluation (Addendum)
Anesthesia Evaluation  Patient identified by MRN, date of birth, ID band Patient awake    Reviewed: Allergy & Precautions, NPO status , Patient's Chart, lab work & pertinent test results, reviewed documented beta blocker date and time   History of Anesthesia Complications (+) PONV  Airway Mallampati: III  TM Distance: >3 FB     Dental  (+) Chipped   Pulmonary asthma , sleep apnea , pneumonia, resolved, Recent URI ,           Cardiovascular hypertension, Pt. on medications      Neuro/Psych  Headaches, PSYCHIATRIC DISORDERS Anxiety Depression  Neuromuscular disease    GI/Hepatic GERD  Controlled,  Endo/Other    Renal/GU      Musculoskeletal   Abdominal   Peds  Hematology   Anesthesia Other Findings Does not feel she has severe sleep apnea. Does not use CPAP. Fentanyl, morphine gives her a headache. But can take dilaudid. Spinal cord stim inserted, but did not work, so its not being used. Wires are still in.  Reproductive/Obstetrics                            Anesthesia Physical Anesthesia Plan  ASA: III  Anesthesia Plan: General   Post-op Pain Management:    Induction: Intravenous  Airway Management Planned: Oral ETT  Additional Equipment:   Intra-op Plan:   Post-operative Plan:   Informed Consent: I have reviewed the patients History and Physical, chart, labs and discussed the procedure including the risks, benefits and alternatives for the proposed anesthesia with the patient or authorized representative who has indicated his/her understanding and acceptance.     Plan Discussed with: CRNA  Anesthesia Plan Comments:         Anesthesia Quick Evaluation

## 2016-06-06 NOTE — Transfer of Care (Signed)
Immediate Anesthesia Transfer of Care Note  Patient: [name]  Procedure(s) Performed: Procedure(s): THYROIDECTOMY (N/A)  Patient Location: PACU  Anesthesia Type:General  Level of Consciousness: awake  Airway & Oxygen Therapy: Patient Spontanous Breathing  Post-op Assessment: Report given to RN  Post vital signs: stable  Last Vitals:  Vitals:   06/06/16 0623  BP: (!) 150/96  Pulse: 78  Resp: 16  Temp: (!) 35.8 C    Last Pain:  Vitals:   06/06/16 0623  TempSrc: Tympanic  PainSc: 7          Complications: No apparent anesthesia complications

## 2016-06-06 NOTE — Op Note (Signed)
Preoperative diagnosis: Left thyroid nodule suspicious for malignancy, Bethesda grade IV.  Postoperative diagnosis: Same.  Operative procedure: Total thyroidectomy with nerve monitoring.  Operating surgeon: Hervey Ard, M.D.  Assistant surgeon: Ellwood Sayers.  Anesthesia: Gen. endotracheal.  Estimate blood loss: 10 mL.  Clinical note: This 55 year old woman was noted to have multiple thyroid nodules and FNA of the midpole lesion on the left side showed a Bethesda grade 4 scoring. This was confirmed with Afirma testing. She is felt to be a candidate for thyroidectomy.  Operative note: The patient underwent general endotracheal anesthesia with the recurrent laryngeal nerve monitoring system in place. A roll was placed behind the shoulder and the neck gently extended and supported. She was placed in 30 head up position and then prepped with ChloraPrep. A collar incision 2 cm above the sternal notch along a skin fold was made after infiltrating a total of 10 mL of 0.5% Xylocaine with 0.25% Marcaine 2 cm away from the planned incision site. Hemostasis was electrocautery. The platysma was divided in a similar fashion. The midline was identified and the strap muscles separated from the underlying gland. The left side was approached first. This was a normal size gland with a firm nodule at the base and a softer nodule in the midportion where FNA sampling had been suspicious. The nerve was monitored and was intact prior to and after dissection. Hemostasis was with the Harmonic scalpel and one 4-0 silk tie a small vessel immediately adjacent to the nerve. The gland was tagged with a long silk suture superiorly and sent for sent in formalin for routine histology.  Attention was turned to the right neck and a similar process the strap muscles were elevated off the gland and the vascular pedicles divided. On both sides the superior gland parathyroid glands were identified and on the right side the inferior  parathyroid gland was identified. Good hemostasis was noted. A small Vacutainer drain was placed through separate stab wound incision. The midline was reconstructed with a running 3-0 Vicryl suture for the strap muscles. The platysmal layer was approximated in similar fashion. The skin was closed with a running 4-0 Vicryl subcuticular suture. Benzoin and Steri-Strips followed by a Telfa dressing was applied.  The patient tolerated the procedure well was taken to the recovery room in stable condition.

## 2016-06-06 NOTE — Progress Notes (Signed)
Called Dr. Bary Castilla to let him know patient asking to change Morphine to Dilaudid.  He did not want to give her IV Dilaudid, so he gave me a verbal order for oral Dilaudid.  Patient also asked about the extended release pain medication and he gave the order for oxycontin as well.

## 2016-06-06 NOTE — H&P (Signed)
Patient with suspicious cytology for left thyroid nodule. Has decided to proceed to total thyroidectomy. Sinus "infection" last week with green nasal discharge. [id number] w/ Nasocort and Z pak with resolution. Lungs: Clear. Cardio: RR. HEENT: Mild tenderness to percussion over maxillary sinuses. Plan: Thyroidectomy.

## 2016-06-06 NOTE — Anesthesia Procedure Notes (Signed)
Procedure Name: Intubation Date/Time: 06/06/2016 7:29 AM Performed by: Hedda Slade Pre-anesthesia Checklist: Patient identified, Emergency Drugs available, Suction available, Patient being monitored and Timeout performed Patient Re-evaluated:Patient Re-evaluated prior to inductionOxygen Delivery Method: Circle system utilized Preoxygenation: Pre-oxygenation with 100% oxygen Intubation Type: IV induction Ventilation: Mask ventilation without difficulty Laryngoscope Size: Glidescope (to confirm placement of nerve monitoring on ETT) Grade View: Grade I Tube type: Oral Tube size: 7.0 mm Number of attempts: 1 Airway Equipment and Method: Stylet Placement Confirmation: ETT inserted through vocal cords under direct vision Secured at: 20 cm Tube secured with: Tape Dental Injury: Teeth and Oropharynx as per pre-operative assessment

## 2016-06-06 NOTE — Progress Notes (Signed)
Serum calcium down, but patient asymptomatic. Will start oral calcium and recheck in AM. No neck swelling. Small amount of serous drainage. Negative Chvostek sign.

## 2016-06-06 NOTE — Anesthesia Postprocedure Evaluation (Signed)
Anesthesia Post Note  Patient: [name]  Procedure(s) Performed: Procedure(s) (LRB): THYROIDECTOMY (N/A)  Patient location during evaluation: PACU Anesthesia Type: General Level of consciousness: awake and alert Pain management: pain level controlled Vital Signs Assessment: post-procedure vital signs reviewed and stable Respiratory status: spontaneous breathing, nonlabored ventilation, respiratory function stable and patient connected to nasal cannula oxygen Cardiovascular status: blood pressure returned to baseline and stable Postop Assessment: no signs of nausea or vomiting Anesthetic complications: no    Last Vitals:  Vitals:   06/06/16 1138 06/06/16 1227  BP: 125/70 126/68  Pulse: 76 78  Resp: 18 18  Temp: 36.8 C 36.8 C    Last Pain:  Vitals:   06/06/16 1227  TempSrc: Oral  PainSc:                  Solangel Mcmanaway S

## 2016-06-07 ENCOUNTER — Other Ambulatory Visit: Payer: Self-pay | Admitting: Internal Medicine

## 2016-06-07 DIAGNOSIS — Z23 Encounter for immunization: Secondary | ICD-10-CM | POA: Diagnosis not present

## 2016-06-07 DIAGNOSIS — D34 Benign neoplasm of thyroid gland: Secondary | ICD-10-CM | POA: Diagnosis not present

## 2016-06-07 DIAGNOSIS — E063 Autoimmune thyroiditis: Secondary | ICD-10-CM | POA: Diagnosis not present

## 2016-06-07 DIAGNOSIS — I10 Essential (primary) hypertension: Secondary | ICD-10-CM | POA: Diagnosis not present

## 2016-06-07 DIAGNOSIS — Z79899 Other long term (current) drug therapy: Secondary | ICD-10-CM | POA: Diagnosis not present

## 2016-06-07 LAB — BASIC METABOLIC PANEL
Anion gap: 7 (ref 5–15)
BUN: 11 mg/dL (ref 6–20)
CO2: 29 mmol/L (ref 22–32)
Calcium: 8.4 mg/dL — ABNORMAL LOW (ref 8.9–10.3)
Chloride: 107 mmol/L (ref 101–111)
Creatinine, Ser: 0.73 mg/dL (ref 0.44–1.00)
GFR calc Af Amer: >60 mL/min (ref >60)
GFR calc non Af Amer: >60 mL/min (ref >60)
Glucose, Bld: 104 mg/dL — ABNORMAL HIGH (ref 65–99)
Potassium: 3.3 mmol/L — ABNORMAL LOW (ref 3.5–5.1)
Sodium: 143 mmol/L (ref 135–145)

## 2016-06-07 LAB — SURGICAL PATHOLOGY

## 2016-06-07 MED ORDER — HYDROCODONE-ACETAMINOPHEN 5-325 MG PO TABS: 1.0000 | ORAL_TABLET | ORAL | 0 refills | Status: DC | PRN

## 2016-06-07 MED ORDER — CALCIUM CARBONATE-VITAMIN D 500-200 MG-UNIT PO TABS: 2.0000 | ORAL_TABLET | Freq: Two times a day (BID) | ORAL | 6 refills | Status: DC

## 2016-06-07 MED ORDER — LEVOTHYROXINE SODIUM 50 MCG PO TABS
100.0000 ug | ORAL_TABLET | Freq: Every day | ORAL | Status: DC
  Administered 2016-06-07: 100 ug via ORAL
  Filled 2016-06-07: qty 2

## 2016-06-07 MED ORDER — INFLUENZA VAC SPLIT QUAD 0.5 ML IM SUSY
0.5000 mL | PREFILLED_SYRINGE | INTRAMUSCULAR | 0 refills | Status: AC
Start: 2016-06-07 — End: 2016-06-07

## 2016-06-07 MED ORDER — LEVOTHYROXINE SODIUM 100 MCG PO TABS: 100.0000 ug | ORAL_TABLET | Freq: Every day | ORAL | 4 refills | Status: DC

## 2016-06-07 MED ORDER — PNEUMOCOCCAL VAC POLYVALENT 25 MCG/0.5ML IJ INJ: 0.5000 mL | INJECTION | INTRAMUSCULAR | 0 refills | Status: AC

## 2016-06-07 NOTE — Progress Notes (Signed)
MD ordered patient to be discharged home.  Discharge instructions were reviewed with the patient and husband and they voiced understanding.  Follow-up appointment was made.  Prescriptions given to the patient.  IV was removed by the student with catheter intact.  All patients questions were answered.  Patient left via wheelchair escorted by auxillary.

## 2016-06-07 NOTE — Final Progress Note (Signed)
AVSS. Some tingling in finger tips last night. + Chvostek this AM Drain: 10 cc- removed without incident.  Lungs: Clear. Cardio: RR. Speech/ pitch: Normal. Pain improving. Reviewed need for continued calcium supplementation.

## 2016-06-08 ENCOUNTER — Telehealth: Payer: Self-pay | Admitting: *Deleted

## 2016-06-08 NOTE — Telephone Encounter (Signed)
-----   Message from Robert Bellow, MD sent at 06/08/2016  8:45 AM EDT ----- Patient underwent thyroidectomy on Monday. She did have some fingertip tingling yesterday at discharge. Please contact the patient is seated this has resolved. If not, I would like her to get a serum calcium done today at the [facility] lab. If she is asymptomatic and would like her to get a serum calcium done tomorrow. Thank you

## 2016-06-08 NOTE — Telephone Encounter (Signed)
Please notify the patient that the pathology did not show any cancer.

## 2016-06-09 ENCOUNTER — Encounter: Payer: Self-pay | Admitting: *Deleted

## 2016-06-14 ENCOUNTER — Other Ambulatory Visit: Payer: Self-pay | Admitting: *Deleted

## 2016-06-14 ENCOUNTER — Other Ambulatory Visit: Payer: Self-pay | Admitting: General Surgery

## 2016-06-14 DIAGNOSIS — E041 Nontoxic single thyroid nodule: Secondary | ICD-10-CM

## 2016-06-14 NOTE — Telephone Encounter (Signed)
No return call noted, MD aware. Follow up as scheduled.

## 2016-06-15 ENCOUNTER — Encounter: Payer: Self-pay | Admitting: General Surgery

## 2016-06-15 ENCOUNTER — Ambulatory Visit (INDEPENDENT_AMBULATORY_CARE_PROVIDER_SITE_OTHER): Payer: Medicare Other | Admitting: General Surgery

## 2016-06-15 VITALS — BP 134/70 | HR 70 | Resp 12 | Ht 67.0 in

## 2016-06-15 DIAGNOSIS — M47817 Spondylosis without myelopathy or radiculopathy, lumbosacral region: Secondary | ICD-10-CM | POA: Diagnosis not present

## 2016-06-15 DIAGNOSIS — M62830 Muscle spasm of back: Secondary | ICD-10-CM | POA: Diagnosis not present

## 2016-06-15 DIAGNOSIS — E065 Other chronic thyroiditis: Secondary | ICD-10-CM

## 2016-06-15 DIAGNOSIS — D34 Benign neoplasm of thyroid gland: Secondary | ICD-10-CM | POA: Insufficient documentation

## 2016-06-15 DIAGNOSIS — G894 Chronic pain syndrome: Secondary | ICD-10-CM | POA: Diagnosis not present

## 2016-06-15 DIAGNOSIS — G4700 Insomnia, unspecified: Secondary | ICD-10-CM | POA: Diagnosis not present

## 2016-06-15 NOTE — Progress Notes (Signed)
Patient ID: [name], female   DOB: September 03, 1960, 55 y.o.   MRN: [id number]  Chief Complaint  Patient presents with  . Routine Post Op    thyroidectopmy    HPI [name] is a 55 y.o. female here today for for post op thyroidectomy done on 06/06/16. Patient states she is doing well. Patient states she had a reaction from the steri strips, the area was red and swollen.  This is in the area where benzoin and Steri-Strips had previously been applied.  Marland KitchenHPI  Past Medical History:  Diagnosis Date  . Allergic rhinitis   . Anxiety   . Arthralgia   . Asthma   . Bronchitis, chronic ([place])   . Chronic back pain   . Collagenous colitis    followed by Dr Tiffany Kocher  . Depression   . Diplopia 11/15/2013  . Dysphagia   . Endometriosis   . Fibrocystic breast disease   . GERD (gastroesophageal reflux disease)   . History of colon polyps   . Hyperlipidemia   . Hypertension   . Hypokalemia   . IBS (irritable bowel syndrome)   . Memory difficulty 11/15/2013  . Migraine headache   . Pneumonia 2017   x2  . PONV (postoperative nausea and vomiting)    used patch  . Sleep apnea    recommended CPAP  . Ulcer disease   . Urine incontinence     Past Surgical History:  Procedure Laterality Date  . ABDOMINAL HYSTERECTOMY     endometriosis  . BACK SURGERY     x6  . BREAST BIOPSY Right   . COLONOSCOPY WITH PROPOFOL N/A 06/25/2015   Procedure: COLONOSCOPY WITH PROPOFOL;  Surgeon: Manya Silvas, MD;  Location[facility] ENDOSCOPY;  Service: Endoscopy;  Laterality: N/A;  . DILATION AND CURETTAGE OF UTERUS     and hysteroscopy  . ESOPHAGOGASTRODUODENOSCOPY N/A 06/25/2015   Procedure: ESOPHAGOGASTRODUODENOSCOPY (EGD);  Surgeon: Manya Silvas, MD;  Location: [facility] ENDOSCOPY;  Service: Endoscopy;  Laterality: N/A;  . insertion of permanent spinal cord stimulator    . insertion of trial spinal cord stimulator    . LUMBAR DISC SURGERY    . LUMBAR FUSION  4/05 and 8/07  . LUMBAR MICRODISCECTOMY   03/28/03   L4-5 L5  . MOUTH SURGERY    . NASAL SEPTOPLASTY W/ TURBINOPLASTY    . removal of spinal cord stimulator    . SEPTOPLASTY     with reduction of turbinates  . THYROIDECTOMY N/A 06/06/2016   Procedure: THYROIDECTOMY;  Surgeon: Robert Bellow, MD;  Location: [facility] ORS;  Service: General;  Laterality: N/A;    Family History  Problem Relation Age of Onset  . Allergies Mother   . Hypertension Mother   . Arthritis Mother   . Hyperlipidemia Mother   . Stroke Mother   . Hypertension Father   . Arthritis Father   . Diabetes Father   . Hypertension Brother   . Diabetes Brother   . Arthritis Maternal Grandmother   . Hyperlipidemia Maternal Grandmother   . Hyperlipidemia Maternal Grandfather   . Arthritis Paternal Grandmother   . Arthritis Paternal Grandfather   . Asthma      several family member  . Leukemia      uncle  . Lymphoma Maternal Aunt     non hodgkins    Social History Social History  Substance Use Topics  . Smoking status: Never Smoker  . Smokeless tobacco: Never Used  . Alcohol use No  Allergies  Allergen Reactions  . Fentanyl Other (See Comments)    headache  . Sumatriptan Other (See Comments)    Severe headache  . Doxycycline Other (See Comments)    REACTION: causes severe abd pain  . Montelukast Other (See Comments)    SEVERE HEADACHE  . Oxymorphone Other (See Comments)    REACTION: severe headaches  . Benzoin Rash  . Buspirone Other (See Comments)    SEVERE HEADACHE  . Ciprofloxacin Nausea Only  . Conjugated Estrogens Other (See Comments)    headaches  . Cymbalta [Duloxetine Hcl] Other (See Comments)    Headache  . Duloxetine Other (See Comments)    Headache  . Erythromycin Ethylsuccinate Other (See Comments)    Stomach ache  . Estradiol Other (See Comments)    headache  . Metronidazole Other (See Comments)    abd pain  . Mirtazapine Other (See Comments)    headache    Current Outpatient Prescriptions  Medication Sig  Dispense Refill  . albuterol (PROVENTIL HFA;VENTOLIN HFA) 108 (90 Base) MCG/ACT inhaler Inhale 2 puffs into the lungs every 4 (four) hours as needed for wheezing or shortness of breath. 1 Inhaler 3  . albuterol (PROVENTIL) (2.5 MG/3ML) 0.083% nebulizer solution Take 2.5 mg by nebulization every 4 (four) hours as needed for wheezing or shortness of breath.    . calcium-vitamin D (OSCAL WITH D) 500-200 MG-UNIT tablet Take 2 tablets by mouth 2 (two) times daily. 120 tablet 6  . diazepam (VALIUM) 5 MG tablet Take 5 mg by mouth 3 (three) times daily as needed.      . docusate sodium (COLACE) 100 MG capsule Take 100 mg by mouth 2 (two) times daily.    Marland Kitchen gabapentin (NEURONTIN) 800 MG tablet Take 800 mg by mouth 4 (four) times daily.  2  . HYDROcodone-acetaminophen (NORCO/VICODIN) 5-325 MG tablet Take 1-2 tablets by mouth every 4 (four) hours as needed for moderate pain. 30 tablet 0  . levothyroxine (SYNTHROID, LEVOTHROID) 100 MCG tablet Take 1 tablet (100 mcg total) by mouth daily before breakfast. 90 tablet 4  . metoprolol (LOPRESSOR) 50 MG tablet TAKE 1/2 TABLET BY MOUTH TWICE A DAY (NEED OFFICE VISIT) 30 tablet 2  . Multiple Vitamin (MULTIVITAMIN) capsule Take 1 capsule by mouth daily.      Marland Kitchen nystatin (NYSTATIN) powder Apply topically 2 (two) times daily. 15 g 0  . nystatin cream (MYCOSTATIN) Apply 1 application topically 2 (two) times daily. 30 g 0  . Omega-3 Fatty Acids (FISH OIL) 1200 MG CAPS Take 1 capsule by mouth daily.    . Oxycodone HCl 10 MG TABS Take 10 mg by mouth 3 (three) times daily as needed.  0  . pantoprazole (PROTONIX) 40 MG tablet TAKE 1 TABLET BY MOUTH EVERY DAY 30 tablet 3  . pravastatin (PRAVACHOL) 10 MG tablet TAKE 1 TABLET (10 MG TOTAL) BY MOUTH DAILY. 45 tablet 6  . RELISTOR 150 MG TABS TAKE 3 TABLETS BY MOUTH ONCE A DAY prn  2  . SYMBICORT 80-4.5 MCG/ACT inhaler INHALE 2 PUFFS BY MOUTH TWICE A DAY IN THE MORNING AND EVENING 1 Inhaler 2  . topiramate (TOPAMAX) 50 MG tablet  TAKE 2 TABLETS BY MOUTH TWICE A DAY 120 tablet 4  . triamcinolone cream (KENALOG) 0.1 % Apply 1 application topically 2 (two) times daily. Do not use more than 7-10 days in one area 30 g 0  . UNABLE TO FIND Med Name: I Cool 1 daily for menopause    .  venlafaxine (EFFEXOR) 75 MG tablet Take 75 mg by mouth once.    . venlafaxine XR (EFFEXOR-XR) 150 MG 24 hr capsule Take 300 mg by mouth once.   11  . XTAMPZA ER 36 MG C12A TAKE 1 CAPSULE BY MOUTH EVERY 12 HOURS  0  . zolpidem (AMBIEN) 10 MG tablet Take 10 mg by mouth at bedtime as needed.       No current facility-administered medications for this visit.     Review of Systems Review of Systems  Constitutional: Negative.   Respiratory: Negative.   Cardiovascular: Negative.     Blood pressure 134/70, pulse 70, resp. rate 12, height 5\' 7"  (1.702 m).  Physical Exam Physical Exam  Constitutional: She is oriented to person, place, and time. She appears well-developed and well-nourished.  Neck:    Red irritation noted along incision  Neurological: She is alert and oriented to person, place, and time.  Negative [place] sign.   Skin: Skin is warm and dry.  Psychiatric: Her behavior is normal.    Data Reviewed A. 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  - BIOPSY SITE CHANGE.  - BACKGROUND CHRONIC LYMPHOCYTIC THYROIDITIS.  - PARATHYROID TISSUE PRESENT.   B. RIGHT THYROID LOBE; LOBECTOMY:  - CHRONIC LYMPHOCYTIC THYROIDITIS.  - PARATHYROID TISSUE PRESENT.   Assessment    Doing well status post thyroidectomy.  Resolution of previous neural excitement with continued oral calcium supplementation.    Plan    The patient was advised that parathyroid tissue was removed. We'll recheck her calcium today. Plan for recheck laboratory in one month.    Apply skin cream with gentle massage to the area. Apply heat to the area as needed for comfort.  Repeat calcium, TSH and T4, and free T3 at next  visit.  Follow up in one month, continue current medication.  This information has been scribed by Gaspar Cola CMA.   Robert Bellow 06/15/2016, 7:53 PM

## 2016-06-15 NOTE — Patient Instructions (Addendum)
The patient is aware to call back for any questions or concerns. Apply skin cream with gentle massage to the area. Apply heat to the area as needed for comfort. Repeat calcium at next visit. Follow up in one month, continue current medication.

## 2016-06-16 LAB — CALCIUM: Calcium: 9.3 mg/dL (ref 8.7–10.2)

## 2016-06-17 ENCOUNTER — Telehealth: Payer: Self-pay

## 2016-06-17 NOTE — Telephone Encounter (Signed)
Notified patient as instructed, patient pleased. Discussed follow-up appointments, patient agrees  

## 2016-06-17 NOTE — Telephone Encounter (Signed)
-----   Message from Robert Bellow, MD sent at 06/17/2016  8:12 AM EDT ----- Please notify the patient her calcium is still a little low, but OK. She should continue her present calcium supplements,2 tabs twice a day. Thanks.  ----- Message ----- From: Lavone Neri Lab Results In Sent: 06/16/2016   5:37 AM To: Robert Bellow, MD

## 2016-06-21 ENCOUNTER — Other Ambulatory Visit: Payer: Self-pay | Admitting: Internal Medicine

## 2016-06-27 ENCOUNTER — Telehealth: Payer: Self-pay | Admitting: *Deleted

## 2016-06-27 NOTE — Telephone Encounter (Signed)
Patient called and states that since November 2nd she has had a throbbing headache, bad sweats, and feelings of being chilled. She states that she has had problems with medications causing her headaches in the past. She is most concerned about the sweats and is not sure if her medication needs to be adjusted. She is scheduled to follow up here on 07/20/16.

## 2016-06-27 NOTE — Telephone Encounter (Signed)
Reassured patient that thyroid supplementation should not be responsible for her sweats or headache. I've asked her to come in tomorrow for blood pressure and vital sign check.

## 2016-07-01 NOTE — Discharge Summary (Signed)
Physician Discharge Summary  Patient ID: [name] MRN: [id number] DOB/AGE: 1961-01-11 55 y.o.  Admit date: 06/06/2016 Discharge date: 07/01/2016  Admission Diagnoses:Thyroid mass, suspicious on [place] FNA sampling  Discharge Diagnoses:  Active Problems:   * No active hospital problems. 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  Discharged Condition: good  Hospital Course: The patient was admitted for overnight observation. She did show significant hypocalcemia, on pathology review secondary to injury to 2 parathyroid glands. She did not require calcium supplements by vein. While she showed some nerve irritability at time of discharge her serum calcium was rising and this resolved spontaneously.  Consults: None  Significant Diagnostic Studies: labs: Serum calcium.  Treatments: IV hydration  Discharge Exam: Blood pressure 119/60, pulse 80, temperature 98.2 F (36.8 C), temperature source Oral, resp. rate 20, height 5\' 7"  (1.702 m), weight 198 lb 12.8 oz (90.2 kg), SpO2 97 %. Neck exam showed no hematoma. The drain was removed without incident. Clear cardiopulmonary exam.  Disposition: 01-Home or Self Care  Discharge Instructions    Diet - low sodium heart healthy    Complete by:  As directed    Discharge instructions    Complete by:  As directed    No driving until pain free.  Heating pad to neck for comfort.  May shower starting tomorrow.  Remove gauze/ change if needed daily.   Gentle neck stretching exercises can start tomorrow.   Tylenol/ advil/ Aleve: If needed for soreness.  Norco (hydrocodone): If needed for neck pain.  Continue all of your baseline pain medications.  Laxative if needed.   Increase activity slowly    Complete by:  As directed        Medication List    TAKE these medications   albuterol (2.5 MG/3ML) 0.083% nebulizer solution Commonly known as:  PROVENTIL Take 2.5 mg by  nebulization every 4 (four) hours as needed for wheezing or shortness of breath.   albuterol 108 (90 Base) MCG/ACT inhaler Commonly known as:  PROVENTIL HFA;VENTOLIN HFA Inhale 2 puffs into the lungs every 4 (four) hours as needed for wheezing or shortness of breath.   AMBIEN 10 MG tablet Generic drug:  zolpidem Take 10 mg by mouth at bedtime as needed.   calcium-vitamin D 500-200 MG-UNIT tablet Commonly known as:  OSCAL WITH D Take 2 tablets by mouth 2 (two) times daily.   diazepam 5 MG tablet Commonly known as:  VALIUM Take 5 mg by mouth 3 (three) times daily as needed.   docusate sodium 100 MG capsule Commonly known as:  COLACE Take 100 mg by mouth 2 (two) times daily.   Fish Oil 1200 MG Caps Take 1 capsule by mouth daily.   gabapentin 800 MG tablet Commonly known as:  NEURONTIN Take 800 mg by mouth 4 (four) times daily.   HYDROcodone-acetaminophen 5-325 MG tablet Commonly known as:  NORCO/VICODIN Take 1-2 tablets by mouth every 4 (four) hours as needed for moderate pain.   levothyroxine 100 MCG tablet Commonly known as:  SYNTHROID, LEVOTHROID Take 1 tablet (100 mcg total) by mouth daily before breakfast.   multivitamin capsule Take 1 capsule by mouth daily.   nystatin cream Commonly known as:  MYCOSTATIN Apply 1 application topically 2 (two) times daily.   nystatin powder Commonly known as:  nystatin Apply topically 2 (two) times daily.   Oxycodone HCl 10 MG Tabs Take 10 mg by mouth 3 (three) times daily as needed.   pantoprazole 40  MG tablet Commonly known as:  PROTONIX TAKE 1 TABLET BY MOUTH EVERY DAY   RELISTOR 150 MG Tabs Generic drug:  Methylnaltrexone Bromide TAKE 3 TABLETS BY MOUTH ONCE A DAY prn   SYMBICORT 80-4.5 MCG/ACT inhaler Generic drug:  budesonide-formoterol INHALE 2 PUFFS BY MOUTH TWICE A DAY IN THE MORNING AND EVENING   topiramate 50 MG tablet Commonly known as:  TOPAMAX TAKE 2 TABLETS BY MOUTH TWICE A DAY   triamcinolone cream  0.1 % Commonly known as:  KENALOG Apply 1 application topically 2 (two) times daily. Do not use more than 7-10 days in one area   UNABLE TO FIND Med Name: I Cool 1 daily for menopause   venlafaxine 75 MG tablet Commonly known as:  EFFEXOR Take 75 mg by mouth once.   venlafaxine XR 150 MG 24 hr capsule Commonly known as:  EFFEXOR-XR Take 300 mg by mouth once.   XTAMPZA ER 36 MG C12a Generic drug:  OxyCODONE ER TAKE 1 CAPSULE BY MOUTH EVERY 12 HOURS     ASK your doctor about these medications   azithromycin 250 MG tablet Commonly known as:  ZITHROMAX Take 2 tablets (500 mg) on  Day 1,  followed by 1 tablet (250 mg) once daily on Days 2 through 5. Ask about: Should I take this medication?   Influenza vac split quadrivalent PF 0.5 ML injection Commonly known as:  FLUARIX Inject 0.5 mLs into the muscle tomorrow at 10 am. Ask about: Should I take this medication?   pneumococcal 23 valent vaccine 25 MCG/0.5ML injection Commonly known as:  PNU-IMMUNE Inject 0.5 mLs into the muscle tomorrow at 10 am. Ask about: Should I take this medication?   pravastatin 10 MG tablet Commonly known as:  PRAVACHOL TAKE 1 TABLET (10 MG TOTAL) BY MOUTH DAILY. Ask about: Which instructions should I use?      Follow-up Information    Zach Tietje, Forest Gleason, MD. Go on 06/15/2016.   Specialties:  General Surgery, Radiology Why:  at 1:00pm for hospital follow-up Contact information: [address] [place] [address] [phone]           Signed: Robert Petersen 07/01/2016, 2:47 PM

## 2016-07-12 ENCOUNTER — Ambulatory Visit (INDEPENDENT_AMBULATORY_CARE_PROVIDER_SITE_OTHER): Payer: Medicare Other | Admitting: Pulmonary Disease

## 2016-07-12 ENCOUNTER — Encounter: Payer: Self-pay | Admitting: Pulmonary Disease

## 2016-07-12 ENCOUNTER — Ambulatory Visit
Admission: RE | Admit: 2016-07-12 | Discharge: 2016-07-12 | Disposition: A | Discharge status: Home / Self Care | Payer: Medicare Other | Source: Ambulatory Visit | Attending: Pulmonary Disease | Admitting: Pulmonary Disease

## 2016-07-12 ENCOUNTER — Other Ambulatory Visit
Admission: RE | Admit: 2016-07-12 | Discharge: 2016-07-12 | Disposition: A | Discharge status: Home / Self Care | Payer: Medicare Other | Source: Ambulatory Visit | Attending: Pulmonary Disease | Admitting: Pulmonary Disease

## 2016-07-12 VITALS — BP 140/90 | HR 83 | Wt 199.0 lb

## 2016-07-12 DIAGNOSIS — M3390 Dermatopolymyositis, unspecified, organ involvement unspecified: Secondary | ICD-10-CM | POA: Diagnosis not present

## 2016-07-12 DIAGNOSIS — J4542 Moderate persistent asthma with status asthmaticus: Secondary | ICD-10-CM | POA: Diagnosis not present

## 2016-07-12 DIAGNOSIS — R918 Other nonspecific abnormal finding of lung field: Secondary | ICD-10-CM | POA: Insufficient documentation

## 2016-07-12 DIAGNOSIS — J189 Pneumonia, unspecified organism: Secondary | ICD-10-CM

## 2016-07-12 DIAGNOSIS — R0602 Shortness of breath: Secondary | ICD-10-CM | POA: Diagnosis not present

## 2016-07-12 DIAGNOSIS — E7800 Pure hypercholesterolemia, unspecified: Secondary | ICD-10-CM

## 2016-07-12 DIAGNOSIS — I10 Essential (primary) hypertension: Secondary | ICD-10-CM

## 2016-07-12 LAB — CBC WITH DIFFERENTIAL/PLATELET
Basophils Absolute: 0 10*3/uL (ref 0–0.1)
Basophils Relative: 0 %
Eosinophils Absolute: 1 10*3/uL — ABNORMAL HIGH (ref 0–0.7)
Eosinophils Relative: 13 %
HCT: 41.2 % (ref 35.0–47.0)
Hemoglobin: 14.3 g/dL (ref 12.0–16.0)
Lymphocytes Relative: 25 %
Lymphs Abs: 2.1 10*3/uL (ref 1.0–3.6)
MCH: 30 pg (ref 26.0–34.0)
MCHC: 34.8 g/dL (ref 32.0–36.0)
MCV: 86.2 fL (ref 80.0–100.0)
Monocytes Absolute: 0.7 10*3/uL (ref 0.2–0.9)
Monocytes Relative: 8 %
Neutro Abs: 4.5 10*3/uL (ref 1.4–6.5)
Neutrophils Relative %: 54 %
Platelets: 236 10*3/uL (ref 150–440)
RBC: 4.78 MIL/uL (ref 3.80–5.20)
RDW: 13 % (ref 11.5–14.5)
WBC: 8.3 10*3/uL (ref 3.6–11.0)

## 2016-07-12 LAB — TSH: TSH: 5.031 u[IU]/mL — ABNORMAL HIGH (ref 0.350–4.500)

## 2016-07-12 LAB — HEPATIC FUNCTION PANEL
ALT: 19 U/L (ref 14–54)
AST: 25 U/L (ref 15–41)
Albumin: 4 g/dL (ref 3.5–5.0)
Alkaline Phosphatase: 81 U/L (ref 38–126)
Bilirubin, Direct: <0.1 mg/dL — ABNORMAL LOW (ref 0.1–0.5)
Total Bilirubin: 0.3 mg/dL (ref 0.3–1.2)
Total Protein: 7.2 g/dL (ref 6.5–8.1)

## 2016-07-12 LAB — BASIC METABOLIC PANEL
Anion gap: 7 (ref 5–15)
BUN: 12 mg/dL (ref 6–20)
CO2: 32 mmol/L (ref 22–32)
Calcium: 8.8 mg/dL — ABNORMAL LOW (ref 8.9–10.3)
Chloride: 103 mmol/L (ref 101–111)
Creatinine, Ser: 0.8 mg/dL (ref 0.44–1.00)
GFR calc Af Amer: >60 mL/min (ref >60)
GFR calc non Af Amer: >60 mL/min (ref >60)
Glucose, Bld: 83 mg/dL (ref 65–99)
Potassium: 3.1 mmol/L — ABNORMAL LOW (ref 3.5–5.1)
Sodium: 142 mmol/L (ref 135–145)

## 2016-07-12 LAB — LIPID PANEL
Cholesterol: 215 mg/dL — ABNORMAL HIGH (ref 0–200)
HDL: 54 mg/dL (ref >40)
LDL Cholesterol: 121 mg/dL — ABNORMAL HIGH (ref 0–99)
Total CHOL/HDL Ratio: 4 RATIO
Triglycerides: 202 mg/dL — ABNORMAL HIGH (ref <150)
VLDL: 40 mg/dL (ref 0–40)

## 2016-07-12 MED ORDER — PREDNISONE 5 MG PO TABS
5.0000 mg | ORAL_TABLET | Freq: Every day | ORAL | 10 refills | Status: DC
Start: 2016-07-12 — End: 2017-06-07

## 2016-07-12 MED ORDER — PREDNISONE 20 MG PO TABS: 40.0000 mg | ORAL_TABLET | Freq: Every day | ORAL | 0 refills | Status: AC

## 2016-07-12 NOTE — Patient Instructions (Addendum)
Chest Xray today  Blood work today - CBC with differential   Prednisone 40 mg daily for 5 days  Then, prednisone 5 mg daily thereafter   Follow up in 6 weeks

## 2016-07-13 ENCOUNTER — Other Ambulatory Visit: Payer: Self-pay | Admitting: Internal Medicine

## 2016-07-13 ENCOUNTER — Telehealth: Payer: Self-pay

## 2016-07-13 ENCOUNTER — Encounter: Payer: Self-pay | Admitting: Internal Medicine

## 2016-07-13 DIAGNOSIS — E876 Hypokalemia: Secondary | ICD-10-CM

## 2016-07-13 MED ORDER — POTASSIUM CHLORIDE ER 10 MEQ PO TBCR
10.0000 meq | EXTENDED_RELEASE_TABLET | Freq: Every day | ORAL | 4 refills | Status: DC
Start: 2016-07-13 — End: 2016-11-27

## 2016-07-13 NOTE — Telephone Encounter (Signed)
See continued message for response.

## 2016-07-13 NOTE — Telephone Encounter (Signed)
Before she starts back on the medication, need to confirm with her surgeon that they want her to start back on it.  I am not sure if they have her off for a specific reason.  Will need to confirm and then can decide if restart or need to change dose.

## 2016-07-13 NOTE — Progress Notes (Signed)
Order placed for f/u potassium lab.  

## 2016-07-13 NOTE — Telephone Encounter (Signed)
Patient has been on thyroid med continuously and will follow up next week

## 2016-07-13 NOTE — Telephone Encounter (Signed)
Patient notified. Patient has not been off of thyroid medicine, she is taking 136mcg and has a follow up appointment next week. Patient states she was not fasting for this. I have sent rx to pharmacy and lab is scheduled.

## 2016-07-13 NOTE — Telephone Encounter (Signed)
-----   Message from Einar Pheasant, MD sent at 07/13/2016  5:36 AM EST ----- Please call pt and notify her that her thyroid test (tsh) is slightly elevated.  She has recently had thyroid surgery.  Need to know dose of medication she is taking now.  Also need to know if she has been off medication or if Dr Bary Castilla has adjusted the dose around her surgery.  Her bad cholesterol has improved.  Triglycerides are slightly elevated.  I recommend a low carb diet.  We will follow.  Her potassium is slightly decreased.  Is she taking any potassium supplements.  Inform her and send info on foods with increased potassium.  Start kcl 10 meq q day.  Recheck potassium in one week.  Kidney function tests and liver function tests are wnl.

## 2016-07-16 NOTE — Progress Notes (Signed)
PROBLEMS: Dermatomyositis Asthma Evanescent pulmonary infiltrates DATA: 03/17/11 CT chest: Atelectasis versus infiltrate within the superior segments of the right upper and right lower lobes 11/16/15: Patchy infiltrate left upper lobe consistent with pneumonia 01/14/16 CXR: Patchy alveolar opacities in the left upper lobe 04/07/16 PFTs: mod-severe obstruction with substantial improvement after bronchodilator (FEV1 1.37 > 1.83 liters, 46-62% pred), mild restriction, normal DLCO 04/07/16 6MWT: 192 meters. More limited by LE weakness 04/20/16 HRCT chest: Extensive air trapping, indicating small airways disease. Mild diffuse bronchial wall thickening. Mild patchy regions of ground-glass attenuation in the mid to upper lungs bilaterally. 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 04/25/16 Office spiro: FEV1 2.15 liters (73%) 07/12/16 Respiratory symptoms deteriorated gradually since last course of prednisone. Progressive SOB, increased wheezing and rescue inhaler use X 2-3 weeks. . .   INTERVAL HISTORY: No major events  SUBJ: As above. Also complains of sinus congestion. Denies cough, fever, CP, sputum, LE edema, calf tenderness  OBJ: Vitals:   07/12/16 1102  BP: 140/90  Pulse: 83  SpO2: 94%  Weight: 199 lb (90.3 kg)    Gen: NAD HEENT: WNL Neck: NO LAN, no JVD noted Lungs: full BS, normal percussion, inspiratory and expiratory wheezes Cardiovascular: Reg, no M noted Abdomen: Soft, NT +BS Ext: no C/C/E Neuro: CNs intact, motor/sens grossly intact Skin: No lesions noted  DATA CXR (07/12/16): No acute infiltrates   IMPRESSION: 1) History of dermatomyositis 2) History of recurrent "pneumonias" 3) Mod persistent asthma with acute exacerbation/bronchiolitis   PLAN: 1) CXR ordered this visit and reviewed above 2) CBC with diff ordered this visit 3) Prednisone 40 mg daily X 5 days 4) then prednisone 5 mg daily thereafter 5) Continue Symbicort  and PRN albuterol 6) Follow up in 6 weeks  Merton Border, MD PCCM service Mobile [phone] Pager [phone] 07/16/2016

## 2016-07-20 ENCOUNTER — Ambulatory Visit: Payer: Medicare Other | Admitting: General Surgery

## 2016-07-21 ENCOUNTER — Other Ambulatory Visit: Payer: Managed Care, Other (non HMO)

## 2016-07-28 IMAGING — CT CT CERVICAL SPINE W/O CM
2 series · 10 of 14 positions shown, 12 images · non-contrast
Comparison: 02/11/2008 postmyelogram cervical spine CT.

CLINICAL DATA: 54-year-old female with C6 cervical radiculopathy.
Neck pain. Right arm and finger numbness. Nonactive neurostimulator
device in place. Initial encounter.

EXAM:
CT CERVICAL SPINE WITHOUT CONTRAST
TECHNIQUE: Multidetector CT imaging of the cervical spine was performed without
intravenous contrast. Multiplanar CT image reconstructions were also
generated.

[Series 2: cspine soft · axial · 0.23mm/px · z∈[-251,-131]mm · 5 of 91 slices shown]
[im 16/91  soft-tissue]
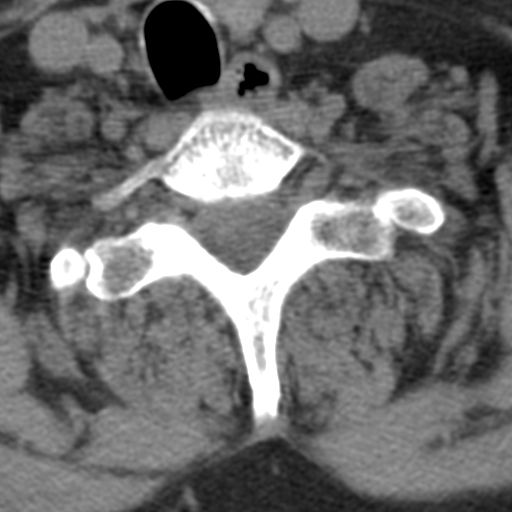
[im 31/91  soft-tissue]
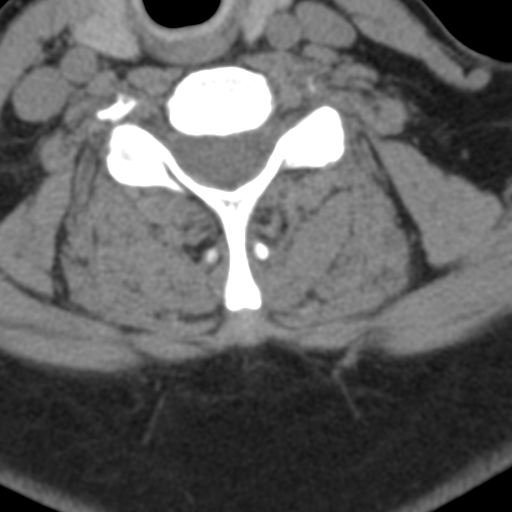
[im 46/91  soft-tissue]
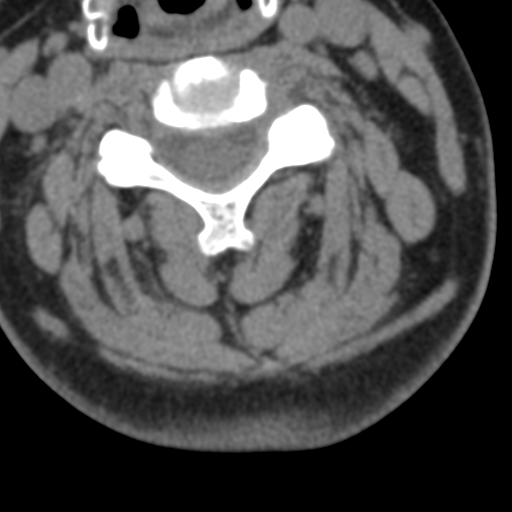
[im 61/91  soft-tissue]
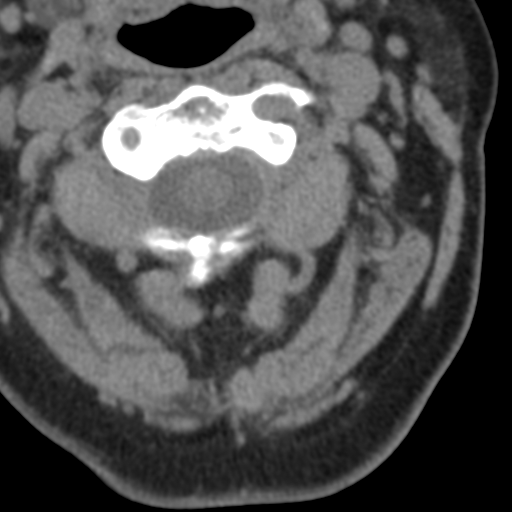
[im 76/91  soft-tissue]
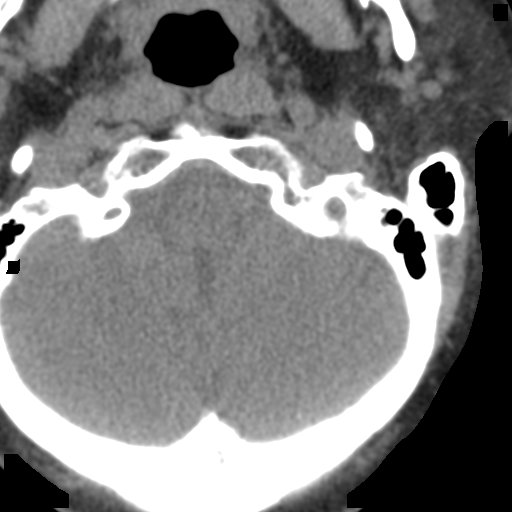

[Series 8: angled axial · axial · 0.23mm/px · z∈[-264,-145]mm · 5 of 91 slices shown, 7 images]
[im 16/91  soft-tissue]
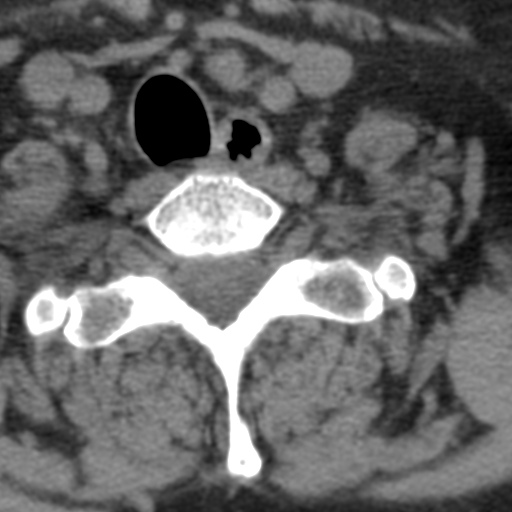
[im 16/91  bone]
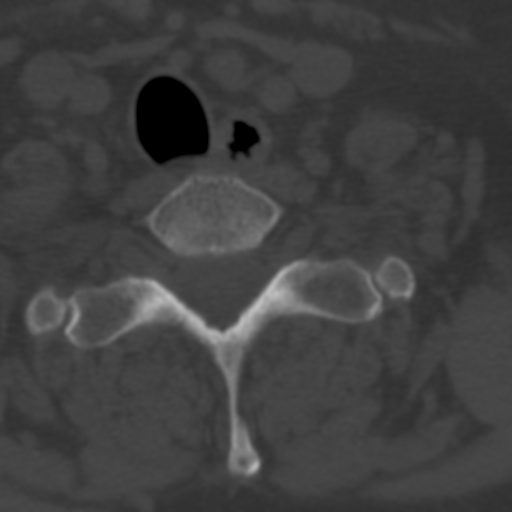
[im 31/91  bone]
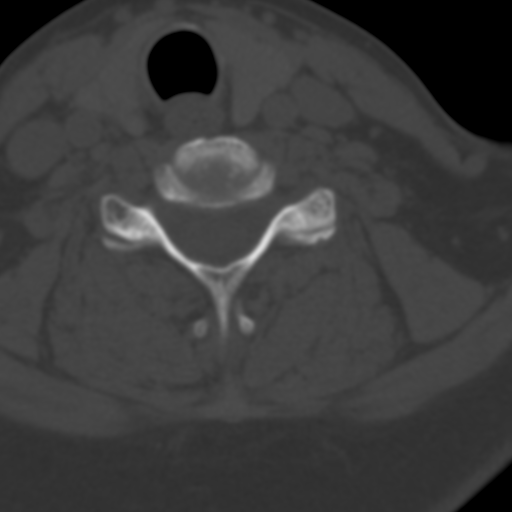
[im 46/91  bone]
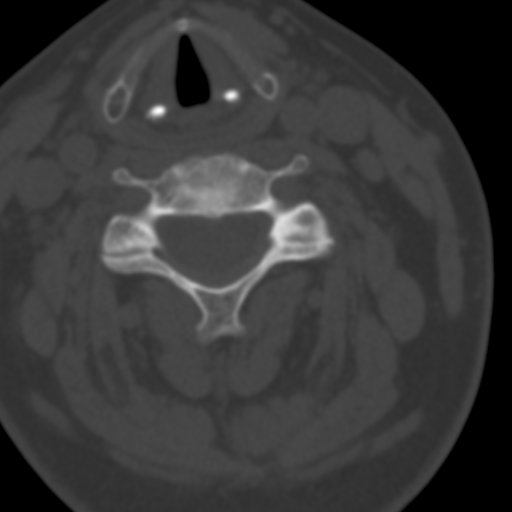
[im 61/91  bone]
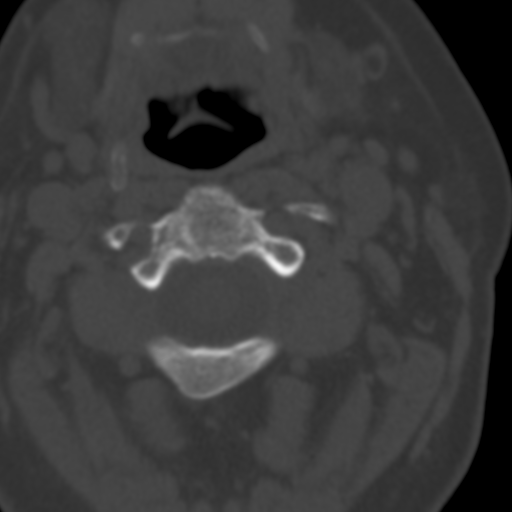
[im 76/91  soft-tissue]
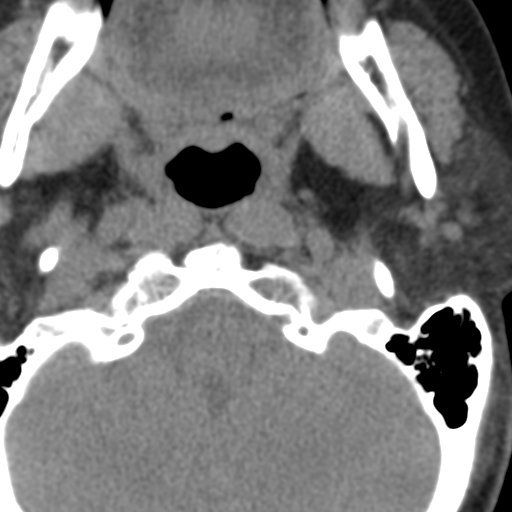
[im 76/91  bone]
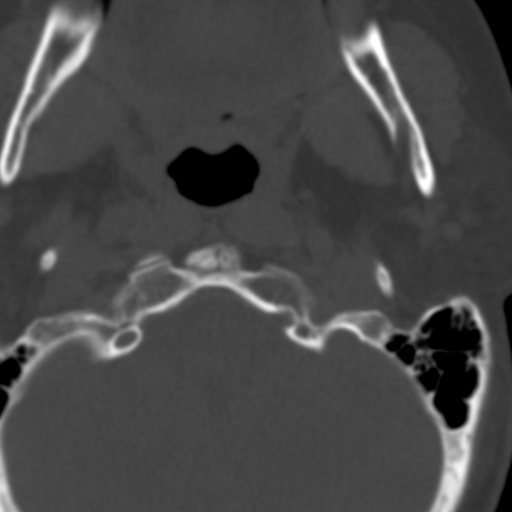

[10 of 14 positions shown; findings below may reference images not displayed]

FINDINGS: Normal alignment of the cervical spine. Cervical medullary junction
within normal limits.

Mild degenerative changes C1-2 articulation.

C2-3:  Negative.

C3-4: Mild left facet degenerative changes. Minimal to mild bulge
slightly greater to left.

C4-5: Small central protrusion slightly greater to left small to
slightly moderate-size central/left paracentral protrusion with
minimal flattening left paracentral aspect of the cord.

C5-6: Bulge/small central protrusion. Mild narrowing ventral thecal
sac.

C6-7:  Negative.

C7-T1:  Negative.

Partially calcified lesions within the inferior aspect of the
thyroid gland measuring up to 2.4 cm on the left. Thyroid ultrasound
recommended for further delineation. Additionally, inferior to the
thyroid gland are small calcified structures suggestive of calcified
lymph nodes. If left thyroid lesion proves to be malignant than
these lymph nodes may represent presence of spread of tumor
(containing colloid). Alternatively, calcified lymph nodes may
represent results of prior infection or inflammatory process
(sarcoidosis) and can be considered after thyroid cancer has been
excluded.
IMPRESSION: No evidence of right-sided disc herniation causing right-sided nerve
root compression. Cervical spondylotic changes most notable C4-5
level as detailed above.

Partially calcified lesions within the inferior aspect of the
thyroid gland measuring up to 2.4 cm on the left. Thyroid ultrasound
recommended for further delineation. Additionally, inferior to the
thyroid gland are small calcified structures suggestive of calcified
lymph nodes. If left thyroid lesion proves to be malignant than
these lymph nodes may represent presence of spread of tumor
(containing colloid). Alternatively, calcified lymph nodes may
represent results of prior infection or inflammatory process
(sarcoidosis) and can be considered after thyroid cancer has been
excluded.

## 2016-08-16 ENCOUNTER — Ambulatory Visit: Payer: Medicare Other | Admitting: General Surgery

## 2016-08-17 DIAGNOSIS — Z79891 Long term (current) use of opiate analgesic: Secondary | ICD-10-CM | POA: Diagnosis not present

## 2016-08-17 DIAGNOSIS — G894 Chronic pain syndrome: Secondary | ICD-10-CM | POA: Diagnosis not present

## 2016-08-17 DIAGNOSIS — M47817 Spondylosis without myelopathy or radiculopathy, lumbosacral region: Secondary | ICD-10-CM | POA: Diagnosis not present

## 2016-08-17 DIAGNOSIS — G4700 Insomnia, unspecified: Secondary | ICD-10-CM | POA: Diagnosis not present

## 2016-08-17 DIAGNOSIS — M62830 Muscle spasm of back: Secondary | ICD-10-CM | POA: Diagnosis not present

## 2016-08-18 ENCOUNTER — Telehealth: Payer: Self-pay | Admitting: Pulmonary Disease

## 2016-08-18 MED ORDER — LEVOFLOXACIN 500 MG PO TABS: 500.0000 mg | ORAL_TABLET | Freq: Every day | ORAL | 0 refills | Status: AC

## 2016-08-18 NOTE — Telephone Encounter (Signed)
Pt informed. Nothing further needed. 

## 2016-08-18 NOTE — Telephone Encounter (Signed)
DS please advise on sick message below and if we can send in abx. Thanks.

## 2016-08-18 NOTE — Telephone Encounter (Signed)
Routing to BT triage for follow up.  

## 2016-08-18 NOTE — Telephone Encounter (Signed)
Pt states she is still sick, coughing up green stuff, congestion, and would like an antibiotic called in.

## 2016-08-18 NOTE — Telephone Encounter (Signed)
I have sent order for Levofloxacin 500 mg daily X 7 days  [name]

## 2016-08-19 ENCOUNTER — Other Ambulatory Visit: Payer: Self-pay | Admitting: Internal Medicine

## 2016-08-24 ENCOUNTER — Ambulatory Visit: Payer: Medicare Other | Admitting: General Surgery

## 2016-08-26 ENCOUNTER — Other Ambulatory Visit: Payer: Self-pay | Admitting: Internal Medicine

## 2016-09-08 ENCOUNTER — Ambulatory Visit: Payer: Managed Care, Other (non HMO) | Admitting: Pulmonary Disease

## 2016-09-13 ENCOUNTER — Ambulatory Visit: Payer: Medicare Other | Admitting: General Surgery

## 2016-09-19 ENCOUNTER — Ambulatory Visit: Payer: Medicare Other | Admitting: Psychiatry

## 2016-09-19 DIAGNOSIS — M62830 Muscle spasm of back: Secondary | ICD-10-CM | POA: Diagnosis not present

## 2016-09-19 DIAGNOSIS — G894 Chronic pain syndrome: Secondary | ICD-10-CM | POA: Diagnosis not present

## 2016-09-19 DIAGNOSIS — G4700 Insomnia, unspecified: Secondary | ICD-10-CM | POA: Diagnosis not present

## 2016-09-19 DIAGNOSIS — M47817 Spondylosis without myelopathy or radiculopathy, lumbosacral region: Secondary | ICD-10-CM | POA: Diagnosis not present

## 2016-09-21 ENCOUNTER — Encounter: Payer: Self-pay | Admitting: Psychiatry

## 2016-09-21 ENCOUNTER — Ambulatory Visit (INDEPENDENT_AMBULATORY_CARE_PROVIDER_SITE_OTHER): Payer: Medicare Other | Admitting: Psychiatry

## 2016-09-21 VITALS — BP 156/90 | HR 80 | Temp 98.0°F | Wt 217.8 lb

## 2016-09-21 DIAGNOSIS — F411 Generalized anxiety disorder: Secondary | ICD-10-CM

## 2016-09-21 DIAGNOSIS — F331 Major depressive disorder, recurrent, moderate: Secondary | ICD-10-CM

## 2016-09-21 MED ORDER — VENLAFAXINE HCL ER 150 MG PO CP24: 300.0000 mg | ORAL_CAPSULE | Freq: Every day | ORAL | 1 refills | Status: DC

## 2016-09-21 MED ORDER — VENLAFAXINE HCL 75 MG PO TABS: 75.0000 mg | ORAL_TABLET | Freq: Once | ORAL | 1 refills | Status: DC

## 2016-09-21 NOTE — Progress Notes (Signed)
BH MD/PA/NP OP Progress Note  09/21/2016 1:41 PM [name]  MRN:  [id number]  Subjective:   Patient is a 56 year old female who presented for follow up accompanied by her husband. She was not seen in this practice for one year. She reported that she has been taking the Effexor XR 375 mg  since last year and she had the refill of the medications. She reported that she tried to decrease the dose of it was not helpful. She does not want to change her medications. She has been getting Valium and zolpidem from her pain management. She stated that she has been doing well on the current combination of the medications. She appeared calm and alert and has been walking with the help of the cane. Her husband remains supportive. Patient does not want to change any medications at this time. She denied having any perceptual disturbances. She denied having any suicidal homicidal ideations or plans.       Chief Complaint:  Chief Complaint    Follow-up; Medication Refill     Visit Diagnosis:     ICD-9-CM ICD-10-CM   1. MDD (major depressive disorder), recurrent episode, moderate (HCC) 296.32 F33.1   2. Anxiety state 300.00 F41.1     Past Medical History:  Past Medical History:  Diagnosis Date  . Allergic rhinitis   . Anxiety   . Arthralgia   . Asthma   . Bronchitis, chronic ([place])   . Chronic back pain   . Collagenous colitis    followed by Dr Tiffany Kocher  . Depression   . Diplopia 11/15/2013  . Dysphagia   . Endometriosis   . Fibrocystic breast disease   . GERD (gastroesophageal reflux disease)   . History of colon polyps   . Hyperlipidemia   . Hypertension   . Hypokalemia   . IBS (irritable bowel syndrome)   . Memory difficulty 11/15/2013  . Migraine headache   . Pneumonia 2017   x2  . PONV (postoperative nausea and vomiting)    used patch  . Sleep apnea    recommended CPAP  . Ulcer disease   . Urine incontinence     Past Surgical History:  Procedure Laterality Date  . ABDOMINAL  HYSTERECTOMY     endometriosis  . BACK SURGERY     x6  . BREAST BIOPSY Right   . COLONOSCOPY WITH PROPOFOL N/A 06/25/2015   Procedure: COLONOSCOPY WITH PROPOFOL;  Surgeon: Manya Silvas, MD;  Location: [facility] ENDOSCOPY;  Service: Endoscopy;  Laterality: N/A;  . DILATION AND CURETTAGE OF UTERUS     and hysteroscopy  . ESOPHAGOGASTRODUODENOSCOPY N/A 06/25/2015   Procedure: ESOPHAGOGASTRODUODENOSCOPY (EGD);  Surgeon: Manya Silvas, MD;  Location: [facility] ENDOSCOPY;  Service: Endoscopy;  Laterality: N/A;  . insertion of permanent spinal cord stimulator    . insertion of trial spinal cord stimulator    . LUMBAR DISC SURGERY    . LUMBAR FUSION  4/05 and 8/07  . LUMBAR MICRODISCECTOMY  03/28/03   L4-5 L5  . MOUTH SURGERY    . NASAL SEPTOPLASTY W/ TURBINOPLASTY    . removal of spinal cord stimulator    . SEPTOPLASTY     with reduction of turbinates  . THYROIDECTOMY N/A 06/06/2016   Procedure: THYROIDECTOMY;  Surgeon: Robert Bellow, MD;  Location: [facility] ORS;  Service: General;  Laterality: N/A;   Family History:  Family History  Problem Relation Age of Onset  . Allergies Mother   . Hypertension Mother   . Arthritis  Mother   . Hyperlipidemia Mother   . Stroke Mother   . Hypertension Father   . Arthritis Father   . Diabetes Father   . Hypertension Brother   . Diabetes Brother   . Arthritis Maternal Grandmother   . Hyperlipidemia Maternal Grandmother   . Hyperlipidemia Maternal Grandfather   . Arthritis Paternal Grandmother   . Arthritis Paternal Grandfather   . Asthma      several family member  . Leukemia      uncle  . Lymphoma Maternal Aunt     non hodgkins   Social History:  Social History   Social History  . Marital status: Married    Spouse name: N/A  . Number of children: 1  . Years of education: 55 th   Occupational History  . Disabled   .  Disabled   Social History Main Topics  . Smoking status: Never Smoker  . Smokeless tobacco: Never Used  .  Alcohol use No  . Drug use: No  . Sexual activity: Not Currently   Other Topics Concern  . None   Social History Narrative  . None   Additional History:  Married x 37 years. Has a daughter and has 3 grandsons.  Was following Dr Mamie Nick for almost 3 years.    Assessment:   Musculoskeletal: Strength & Muscle Tone: within normal limits Gait & Station: unsteady Patient leans: N/A  Psychiatric Specialty Exam: HPI  ROS  Blood pressure (!) 156/90, pulse 80, temperature 98 F (36.7 C), temperature source Oral, weight 217 lb 12.8 oz (98.8 kg).Body mass index is 34.11 kg/m.  General Appearance: Casual  Eye Contact:  Fair  Speech:  Clear and Coherent  Volume:  Decreased  Mood:  Anxious  Affect:  Congruent and Constricted  Thought Process:  Coherent  Orientation:  Full (Time, Place, and Person)  Thought Content:  WDL  Suicidal Thoughts:  No  Homicidal Thoughts:  No  Memory:  Immediate;   Fair  Judgement:  Fair  Insight:  Fair  Psychomotor Activity:  Decreased  Concentration:  Fair  Recall:  [employer] of Knowledge: Fair  Language: Fair  Akathisia:  No  Handed:  Right  AIMS (if indicated):    Assets:  Communication Skills Desire for Improvement Physical Health Social Support  ADL's:  Intact  Cognition: WNL  Sleep:  6-7   Is the patient at risk to self?  No. Has the patient been a risk to self in the past 6 months?  No. Has the patient been a risk to self within the distant past?  No. Is the patient a risk to others?  No. Has the patient been a risk to others in the past 6 months?  No. Has the patient been a risk to others within the distant past?  No.  Current Medications: Current Outpatient Prescriptions  Medication Sig Dispense Refill  . albuterol (PROVENTIL HFA;VENTOLIN HFA) 108 (90 Base) MCG/ACT inhaler Inhale 2 puffs into the lungs every 4 (four) hours as needed for wheezing or shortness of breath. 1 Inhaler 3  . albuterol (PROVENTIL) (2.5 MG/3ML) 0.083%  nebulizer solution Take 2.5 mg by nebulization every 4 (four) hours as needed for wheezing or shortness of breath.    . calcium-vitamin D (OSCAL WITH D) 500-200 MG-UNIT tablet Take 2 tablets by mouth 2 (two) times daily. 120 tablet 6  . diazepam (VALIUM) 5 MG tablet Take 5 mg by mouth 3 (three) times daily as needed.      Marland Kitchen  docusate sodium (COLACE) 100 MG capsule Take 100 mg by mouth 2 (two) times daily.    Marland Kitchen gabapentin (NEURONTIN) 800 MG tablet Take 800 mg by mouth 4 (four) times daily.  2  . levothyroxine (SYNTHROID, LEVOTHROID) 100 MCG tablet Take 1 tablet (100 mcg total) by mouth daily before breakfast. 90 tablet 4  . metoprolol (LOPRESSOR) 50 MG tablet TAKE 1/2 TABLET BY MOUTH TWICE A DAY (NEED OFFICE VISIT) 30 tablet 1  . Multiple Vitamin (MULTIVITAMIN) capsule Take 1 capsule by mouth daily.      Marland Kitchen nystatin (NYSTATIN) powder Apply topically 2 (two) times daily. 15 g 0  . nystatin cream (MYCOSTATIN) Apply 1 application topically 2 (two) times daily. 30 g 0  . Omega-3 Fatty Acids (FISH OIL) 1200 MG CAPS Take 1 capsule by mouth daily.    . Oxycodone HCl 10 MG TABS Take 10 mg by mouth 3 (three) times daily as needed.  0  . pantoprazole (PROTONIX) 40 MG tablet TAKE 1 TABLET BY MOUTH EVERY DAY 30 tablet 3  . potassium chloride (K-DUR) 10 MEQ tablet Take 1 tablet (10 mEq total) by mouth daily. 30 tablet 4  . pravastatin (PRAVACHOL) 10 MG tablet TAKE 1 TABLET (10 MG TOTAL) BY MOUTH DAILY. 45 tablet 6  . predniSONE (DELTASONE) 5 MG tablet Take 1 tablet (5 mg total) by mouth daily with breakfast. 30 tablet 10  . RELISTOR 150 MG TABS TAKE 3 TABLETS BY MOUTH ONCE A DAY prn  2  . SYMBICORT 80-4.5 MCG/ACT inhaler INHALE 2 PUFFS BY MOUTH TWICE A DAY IN THE MORNING AND EVENING 1 Inhaler 2  . topiramate (TOPAMAX) 50 MG tablet TAKE 2 TABLETS BY MOUTH TWICE A DAY 120 tablet 4  . triamcinolone cream (KENALOG) 0.1 % Apply 1 application topically 2 (two) times daily. Do not use more than 7-10 days in one area 30  g 0  . UNABLE TO FIND Med Name: I Cool 1 daily for menopause    . venlafaxine (EFFEXOR) 75 MG tablet Take 1 tablet (75 mg total) by mouth once. 30 tablet 1  . venlafaxine XR (EFFEXOR-XR) 150 MG 24 hr capsule Take 2 capsules (300 mg total) by mouth daily with breakfast. 60 capsule 1  . XTAMPZA ER 36 MG C12A TAKE 1 CAPSULE BY MOUTH EVERY 12 HOURS  0  . zolpidem (AMBIEN) 10 MG tablet Take 10 mg by mouth at bedtime as needed.       No current facility-administered medications for this visit.      Medical Decision Making:  Review of Psycho-Social Stressors (1)  Treatment Plan Summary:Medication management  I reviewed all the medications with the patient and her husband. She is getting Valium and Ambien from the [place] [facility]. I will start her on Effexor 375 mg daily. Her husband reported that they have enough supply of the medication at this time.  She will follow-up in 2 months or earlier  No previous records of the patient available in the practice   More than 50% of the time spent in psychoeducation, counseling and coordination of care.    This note was generated in part or whole with voice recognition software. Voice regonition is usually quite accurate but there are transcription errors that can and very often do occur. I apologize for any typographical errors that were not detected and corrected.     Rainey Pines, MD    09/21/2016, 1:41 PM

## 2016-09-22 ENCOUNTER — Telehealth: Payer: Self-pay | Admitting: *Deleted

## 2016-09-22 ENCOUNTER — Encounter: Payer: Self-pay | Admitting: General Surgery

## 2016-09-22 ENCOUNTER — Ambulatory Visit (INDEPENDENT_AMBULATORY_CARE_PROVIDER_SITE_OTHER): Payer: Medicare Other | Admitting: General Surgery

## 2016-09-22 VITALS — BP 160/90 | HR 62 | Resp 12 | Ht 67.0 in | Wt 217.0 lb

## 2016-09-22 DIAGNOSIS — D34 Benign neoplasm of thyroid gland: Secondary | ICD-10-CM

## 2016-09-22 DIAGNOSIS — E041 Nontoxic single thyroid nodule: Secondary | ICD-10-CM | POA: Diagnosis not present

## 2016-09-22 NOTE — Patient Instructions (Signed)
Follow up appointment to be announced.  

## 2016-09-22 NOTE — Telephone Encounter (Signed)
Pt would like to inform Dr. Nicki Reaper that she blood pressure has averaged around 160/90.  Pt requested a call to discuss medications Pt contact [phone]

## 2016-09-22 NOTE — Telephone Encounter (Signed)
lmovm to call office

## 2016-09-22 NOTE — Progress Notes (Signed)
Patient ID: [name], female   DOB: 04-Apr-1961, 56 y.o.   MRN: [id number]  Chief Complaint  Patient presents with  . Follow-up    tyhroid     HPI [name] is a 56 y.o. female here today for her one month thyroid. Patient states she has been having a headache daily.  She states she still have trouble getting her food down. This has been a long-standing problem previously evaluated by Dr. Vira Agar. Dilatation 15 months ago produced minimal improvement. She did not follow-up for reassessment. No symptoms suggestive of ongoing hypocalcemia.  The patient reports that she's been eating constantly, and had been notified that her TSH level was elevated 6 weeks after surgery. The patient is also been on a steroid taper and is on prednisone 5 mg daily. She reports eating cheese puffs throughout the evening. No discomfort or dysphasia with this food.     HPI  Past Medical History:  Diagnosis Date  . Allergic rhinitis   . Anxiety   . Arthralgia   . Asthma   . Bronchitis, chronic ([place])   . Chronic back pain   . Collagenous colitis    followed by Dr Tiffany Kocher  . Depression   . Diplopia 11/15/2013  . Dysphagia   . Endometriosis   . Fibrocystic breast disease   . GERD (gastroesophageal reflux disease)   . History of colon polyps   . Hyperlipidemia   . Hypertension   . Hypokalemia   . IBS (irritable bowel syndrome)   . Memory difficulty 11/15/2013  . Migraine headache   . Pneumonia 2017   x2  . PONV (postoperative nausea and vomiting)    used patch  . Sleep apnea    recommended CPAP  . Ulcer disease   . Urine incontinence     Past Surgical History:  Procedure Laterality Date  . ABDOMINAL HYSTERECTOMY     endometriosis  . BACK SURGERY     x6  . BREAST BIOPSY Right   . COLONOSCOPY WITH PROPOFOL N/A 06/25/2015   Procedure: COLONOSCOPY WITH PROPOFOL;  Surgeon: Manya Silvas, MD;  Location: [facility] ENDOSCOPY;  Service: Endoscopy;  Laterality: N/A;  . DILATION AND CURETTAGE OF  UTERUS     and hysteroscopy  . ESOPHAGOGASTRODUODENOSCOPY N/A 06/25/2015   Procedure: ESOPHAGOGASTRODUODENOSCOPY (EGD);  Surgeon: Manya Silvas, MD;  Location: [facility] ENDOSCOPY;  Service: Endoscopy;  Laterality: N/A;  . insertion of permanent spinal cord stimulator    . insertion of trial spinal cord stimulator    . LUMBAR DISC SURGERY    . LUMBAR FUSION  4/05 and 8/07  . LUMBAR MICRODISCECTOMY  03/28/03   L4-5 L5  . MOUTH SURGERY    . NASAL SEPTOPLASTY W/ TURBINOPLASTY    . removal of spinal cord stimulator    . SEPTOPLASTY     with reduction of turbinates  . THYROIDECTOMY N/A 06/06/2016   Procedure: THYROIDECTOMY;  Surgeon: Robert Bellow, MD;  Location: [facility] ORS;  Service: General;  Laterality: N/A;    Family History  Problem Relation Age of Onset  . Allergies Mother   . Hypertension Mother   . Arthritis Mother   . Hyperlipidemia Mother   . Stroke Mother   . Hypertension Father   . Arthritis Father   . Diabetes Father   . Hypertension Brother   . Diabetes Brother   . Arthritis Maternal Grandmother   . Hyperlipidemia Maternal Grandmother   . Hyperlipidemia Maternal Grandfather   . Arthritis Paternal Grandmother   .  Arthritis Paternal Grandfather   . Asthma      several family member  . Leukemia      uncle  . Lymphoma Maternal Aunt     non hodgkins    Social History Social History  Substance Use Topics  . Smoking status: Never Smoker  . Smokeless tobacco: Never Used  . Alcohol use No    Allergies  Allergen Reactions  . Fentanyl Other (See Comments)    headache  . Sumatriptan Other (See Comments)    Severe headache  . Doxycycline Other (See Comments)    REACTION: causes severe abd pain  . Montelukast Other (See Comments)    SEVERE HEADACHE  . Oxymorphone Other (See Comments)    REACTION: severe headaches  . Benzoin Rash  . Buspirone Other (See Comments)    SEVERE HEADACHE  . Ciprofloxacin Nausea Only  . Conjugated Estrogens Other (See Comments)     headaches  . Cymbalta [Duloxetine Hcl] Other (See Comments)    Headache  . Duloxetine Other (See Comments)    Headache  . Erythromycin Ethylsuccinate Other (See Comments)    Stomach ache  . Estradiol Other (See Comments)    headache  . Metronidazole Other (See Comments)    abd pain  . Mirtazapine Other (See Comments)    headache    Current Outpatient Prescriptions  Medication Sig Dispense Refill  . albuterol (PROVENTIL HFA;VENTOLIN HFA) 108 (90 Base) MCG/ACT inhaler Inhale 2 puffs into the lungs every 4 (four) hours as needed for wheezing or shortness of breath. 1 Inhaler 3  . albuterol (PROVENTIL) (2.5 MG/3ML) 0.083% nebulizer solution Take 2.5 mg by nebulization every 4 (four) hours as needed for wheezing or shortness of breath.    . calcium-vitamin D (OSCAL WITH D) 500-200 MG-UNIT tablet Take 2 tablets by mouth 2 (two) times daily. 120 tablet 6  . diazepam (VALIUM) 5 MG tablet Take 5 mg by mouth 3 (three) times daily as needed.      . docusate sodium (COLACE) 100 MG capsule Take 100 mg by mouth 2 (two) times daily.    Marland Kitchen gabapentin (NEURONTIN) 800 MG tablet Take 800 mg by mouth 4 (four) times daily.  2  . levothyroxine (SYNTHROID, LEVOTHROID) 100 MCG tablet Take 1 tablet (100 mcg total) by mouth daily before breakfast. 90 tablet 4  . metoprolol (LOPRESSOR) 50 MG tablet TAKE 1/2 TABLET BY MOUTH TWICE A DAY (NEED OFFICE VISIT) 30 tablet 1  . Multiple Vitamin (MULTIVITAMIN) capsule Take 1 capsule by mouth daily.      Marland Kitchen nystatin (NYSTATIN) powder Apply topically 2 (two) times daily. 15 g 0  . nystatin cream (MYCOSTATIN) Apply 1 application topically 2 (two) times daily. 30 g 0  . Omega-3 Fatty Acids (FISH OIL) 1200 MG CAPS Take 1 capsule by mouth daily.    . Oxycodone HCl 10 MG TABS Take 10 mg by mouth 3 (three) times daily as needed.  0  . pantoprazole (PROTONIX) 40 MG tablet TAKE 1 TABLET BY MOUTH EVERY DAY 30 tablet 3  . potassium chloride (K-DUR) 10 MEQ tablet Take 1 tablet (10  mEq total) by mouth daily. 30 tablet 4  . pravastatin (PRAVACHOL) 10 MG tablet TAKE 1 TABLET (10 MG TOTAL) BY MOUTH DAILY. 45 tablet 6  . predniSONE (DELTASONE) 5 MG tablet Take 1 tablet (5 mg total) by mouth daily with breakfast. 30 tablet 10  . RELISTOR 150 MG TABS TAKE 3 TABLETS BY MOUTH ONCE A DAY prn  2  .  SYMBICORT 80-4.5 MCG/ACT inhaler INHALE 2 PUFFS BY MOUTH TWICE A DAY IN THE MORNING AND EVENING 1 Inhaler 2  . topiramate (TOPAMAX) 50 MG tablet TAKE 2 TABLETS BY MOUTH TWICE A DAY 120 tablet 4  . triamcinolone cream (KENALOG) 0.1 % Apply 1 application topically 2 (two) times daily. Do not use more than 7-10 days in one area 30 g 0  . UNABLE TO FIND Med Name: I Cool 1 daily for menopause    . venlafaxine XR (EFFEXOR-XR) 150 MG 24 hr capsule Take 2 capsules (300 mg total) by mouth daily with breakfast. 60 capsule 1  . XTAMPZA ER 36 MG C12A TAKE 1 CAPSULE BY MOUTH EVERY 12 HOURS  0  . zolpidem (AMBIEN) 10 MG tablet Take 10 mg by mouth at bedtime as needed.      . venlafaxine (EFFEXOR) 75 MG tablet Take 1 tablet (75 mg total) by mouth once. 30 tablet 1   No current facility-administered medications for this visit.     Review of Systems Review of Systems  Constitutional: Negative.   Respiratory: Negative.   Cardiovascular: Negative.     Blood pressure (!) 160/90, pulse 62, resp. rate 12, height 5\' 7"  (1.702 m), weight 217 lb (98.4 kg).  Physical Exam Physical Exam  Constitutional: She is oriented to person, place, and time. She appears well-developed and well-nourished.  Eyes: Conjunctivae are normal. No scleral icterus.  Neck: Neck supple.    Cardiovascular: Normal rate, regular rhythm and normal heart sounds.   Pulmonary/Chest: Effort normal and breath sounds normal.  Lymphadenopathy:    She has no cervical adenopathy.  Neurological: She is alert and oriented to person, place, and time.  Skin: Skin is warm and dry.    Data Reviewed Laboratory studies obtained today show  calcium 9.4, TSH@4 .8, T4 total at 7.3, free T3 2 0.5.  Assessment     Resolution of postop hypocalcemia. T4 and T3 at lower limits of normal, minimal elevation of the TSH.      Plan    The patient may decrease her calcium supplements to 1 tablet twice a day.  Will increase her Synthroid 125 g daily and plan on a repeat T3, T4 and TSH at that time.    The patient was encouraged to contact Dr. Vira Agar for follow-up regarding her dysphagia, and was encouraged to discontinue her evening cheese puffs.  This information has been scribed by Gaspar Cola CMA.   Robert Bellow 09/23/2016, 9:27 AM

## 2016-09-23 ENCOUNTER — Telehealth: Payer: Self-pay | Admitting: *Deleted

## 2016-09-23 LAB — T4 AND TSH
T4, Total: 7.3 ug/dL (ref 4.5–12.0)
TSH: 4.82 u[IU]/mL — ABNORMAL HIGH (ref 0.450–4.500)

## 2016-09-23 LAB — T3, FREE: T3, Free: 2.5 pg/mL (ref 2.0–4.4)

## 2016-09-23 LAB — CALCIUM: Calcium: 9.4 mg/dL (ref 8.7–10.2)

## 2016-09-23 MED ORDER — LEVOTHYROXINE SODIUM 125 MCG PO TABS: 125.0000 ug | ORAL_TABLET | Freq: Every day | ORAL | 4 refills | Status: DC

## 2016-09-23 NOTE — Telephone Encounter (Signed)
Left message for patient to return cal to office needs to be scheduled with dr. Nicki Reaper on Tuesday.

## 2016-09-23 NOTE — Telephone Encounter (Signed)
Notified patient as instructed,The patient can decrease her calcium supplements to 1 tablet twice a day. patient pleased. Discussed follow-up appointments, patient agrees.

## 2016-09-23 NOTE — Telephone Encounter (Signed)
Needs to be seen.  If no acute symptoms and ok to wait, I can see her Tuesday 09/27/16 at 9:00 or 10:00.  Thanks

## 2016-09-23 NOTE — Telephone Encounter (Signed)
Called pt l/m that we have put her in the schedule for 10am on Tuesday 2-13. If she will not make that app she is to call our office on Monday.

## 2016-09-23 NOTE — Telephone Encounter (Signed)
Called pt her BP have been up for last she is not on any blood pressure meds at this time. She denies any chest pain or shortness of breath or any other symptoms. She does have a head ache but states that they are changing her thyroid medications at this time. She has physical with you in April wanted to know if you felt like she could wait to be seen until then or if we need to get her in sooner? She will need a pm app if we do.   Per pt just leave v/m with information she did not sleep well last night and is going back to bed.

## 2016-09-23 NOTE — Telephone Encounter (Signed)
-----   Message from Robert Bellow, MD sent at 09/23/2016  9:30 AM EST ----- These notify the patient her thyroid levels are near normal, but I'm going to increase her dose to 125 g daily. The prescription has been sent for pharmacy.  The patient needs a T4, TSH and free T3 in 3 months. She'll be contacted after the laboratory is reviewed to determine if a follow-up visit is required.  Encourage her to contact Dr. Vira Agar regarding her difficulty swallowing.

## 2016-09-23 NOTE — Telephone Encounter (Signed)
Please call pt at [phone]

## 2016-09-27 ENCOUNTER — Ambulatory Visit: Payer: Managed Care, Other (non HMO) | Admitting: Internal Medicine

## 2016-10-13 ENCOUNTER — Telehealth: Payer: Self-pay | Admitting: Internal Medicine

## 2016-10-13 MED ORDER — NYSTATIN 100000 UNIT/GM EX CREA: 1.0000 "application " | TOPICAL_CREAM | Freq: Two times a day (BID) | CUTANEOUS | 0 refills | Status: DC

## 2016-10-13 MED ORDER — NYSTATIN 100000 UNIT/GM EX POWD: Freq: Two times a day (BID) | CUTANEOUS | 0 refills | Status: DC

## 2016-10-13 NOTE — Telephone Encounter (Signed)
Refilled nystatin cream and nystatin powder.  Needs to keep f/u appt.

## 2016-10-13 NOTE — Telephone Encounter (Signed)
Can this be refilled? 

## 2016-10-13 NOTE — Telephone Encounter (Signed)
Pt called requesting a refill on her nystatin (NYSTATIN) powder and nystatin cream (MYCOSTATIN). Please advise, thank you!  Call pt @ [phone]  Pharmacy - [id number] [address], [place]

## 2016-10-15 ENCOUNTER — Other Ambulatory Visit: Payer: Self-pay | Admitting: Internal Medicine

## 2016-10-24 ENCOUNTER — Telehealth: Payer: Self-pay | Admitting: Internal Medicine

## 2016-10-24 NOTE — Telephone Encounter (Signed)
Patient advised of below and verbalized . Explained importance of getting evaluated today.  She stated she wasn't sure she could get evaluated today.  I again explained the importance of getting evaluated today.  She stated she would get in touch with husband and go to ER for work up.

## 2016-10-24 NOTE — Telephone Encounter (Addendum)
Reason for call:elevated Bp, headaches, shortness of breath with exertion, history of asthma, tingling in face left side 2 weeks ago, chest pain  Symptoms:elevated BP 174/104 pulse 107 this am BP 187/104 last night pulse 109, last month  Bp readings 150/90 3/1 148/92 pulse 81,2/28 152/91 pulse 82 Duration over the past  Medications: Prednisone daily Family history of stroke,  Advised patient no openings today, go to urgent care for evaluation, ER Please advise.

## 2016-10-24 NOTE — Telephone Encounter (Signed)
Pt called and stated that her bp has been running really high it has been running around 186/104 and pulse is 106. Pt is a little concerned. Please advise, thank you!  Call pt @ [phone].

## 2016-10-24 NOTE — Telephone Encounter (Signed)
Noted. Thanks.

## 2016-10-24 NOTE — Telephone Encounter (Signed)
Agree.  If increased sob, headache and elevated blood pressure, needs evaluation today.

## 2016-10-31 ENCOUNTER — Ambulatory Visit: Payer: Managed Care, Other (non HMO) | Admitting: Pulmonary Disease

## 2016-10-31 DIAGNOSIS — M47817 Spondylosis without myelopathy or radiculopathy, lumbosacral region: Secondary | ICD-10-CM | POA: Diagnosis not present

## 2016-10-31 DIAGNOSIS — G4700 Insomnia, unspecified: Secondary | ICD-10-CM | POA: Diagnosis not present

## 2016-10-31 DIAGNOSIS — G894 Chronic pain syndrome: Secondary | ICD-10-CM | POA: Diagnosis not present

## 2016-10-31 DIAGNOSIS — M62830 Muscle spasm of back: Secondary | ICD-10-CM | POA: Diagnosis not present

## 2016-11-08 ENCOUNTER — Emergency Department
Admission: EM | Admit: 2016-11-08 | Discharge: 2016-11-08 | Disposition: A | Discharge status: Home / Self Care | Payer: Medicare Other | Attending: Emergency Medicine | Admitting: Emergency Medicine

## 2016-11-08 ENCOUNTER — Emergency Department: Payer: Medicare Other

## 2016-11-08 ENCOUNTER — Encounter: Payer: Self-pay | Admitting: Emergency Medicine

## 2016-11-08 DIAGNOSIS — J45909 Unspecified asthma, uncomplicated: Secondary | ICD-10-CM | POA: Diagnosis not present

## 2016-11-08 DIAGNOSIS — I10 Essential (primary) hypertension: Secondary | ICD-10-CM | POA: Diagnosis not present

## 2016-11-08 DIAGNOSIS — Z79899 Other long term (current) drug therapy: Secondary | ICD-10-CM | POA: Insufficient documentation

## 2016-11-08 DIAGNOSIS — R0602 Shortness of breath: Secondary | ICD-10-CM | POA: Diagnosis not present

## 2016-11-08 DIAGNOSIS — R079 Chest pain, unspecified: Secondary | ICD-10-CM | POA: Insufficient documentation

## 2016-11-08 LAB — BASIC METABOLIC PANEL
Anion gap: 6 (ref 5–15)
BUN: 13 mg/dL (ref 6–20)
CO2: 28 mmol/L (ref 22–32)
Calcium: 8.4 mg/dL — ABNORMAL LOW (ref 8.9–10.3)
Chloride: 105 mmol/L (ref 101–111)
Creatinine, Ser: 0.72 mg/dL (ref 0.44–1.00)
GFR calc Af Amer: >60 mL/min (ref >60)
GFR calc non Af Amer: >60 mL/min (ref >60)
Glucose, Bld: 104 mg/dL — ABNORMAL HIGH (ref 65–99)
Potassium: 3.3 mmol/L — ABNORMAL LOW (ref 3.5–5.1)
Sodium: 139 mmol/L (ref 135–145)

## 2016-11-08 LAB — CBC
HCT: 39.1 % (ref 35.0–47.0)
Hemoglobin: 13.6 g/dL (ref 12.0–16.0)
MCH: 30.9 pg (ref 26.0–34.0)
MCHC: 34.8 g/dL (ref 32.0–36.0)
MCV: 89 fL (ref 80.0–100.0)
Platelets: 254 10*3/uL (ref 150–440)
RBC: 4.39 MIL/uL (ref 3.80–5.20)
RDW: 12.6 % (ref 11.5–14.5)
WBC: 9.2 10*3/uL (ref 3.6–11.0)

## 2016-11-08 LAB — TROPONIN I
Troponin I: <0.03 ng/mL (ref <0.03)
Troponin I: <0.03 ng/mL (ref <0.03)

## 2016-11-08 MED ORDER — IPRATROPIUM-ALBUTEROL 0.5-2.5 (3) MG/3ML IN SOLN
3.0000 mL | Freq: Once | RESPIRATORY_TRACT | Status: AC
  Administered 2016-11-08: 3 mL via RESPIRATORY_TRACT
  Filled 2016-11-08: qty 3

## 2016-11-08 MED ORDER — HYDROCHLOROTHIAZIDE 12.5 MG PO TABS: 12.5000 mg | ORAL_TABLET | Freq: Every day | ORAL | 0 refills | Status: DC

## 2016-11-08 NOTE — Discharge Instructions (Signed)
Please seek medical attention for any high fevers, chest pain, shortness of breath, change in behavior, persistent vomiting, bloody stool or any other new or concerning symptoms.  

## 2016-11-08 NOTE — ED Provider Notes (Signed)
[facility] Emergency Department Provider Note   ____________________________________________   I have reviewed the triage vital signs and the nursing notes.   HISTORY  Chief Complaint Chest Pain and Shortness of Breath   History limited by: Not Limited   HPI [name] is a 56 y.o. female who presents to the emergency department today with multiple concerns. Her primary concerns is for high blood pressure. She states that her blood pressure is been high for the past few weeks. She has been trying to get in with primary care doctor but has been unsuccessful. For the past few weeks patient has also felt short of breath. She does have a history of asthma. The patient started developing some chest pain. It is located centrally. It lasted about 10 minutes. There was some radiation down her arm. She states she had a catheterization a few years ago which was negative.   Past Medical History:  Diagnosis Date  . Allergic rhinitis   . Anxiety   . Arthralgia   . Asthma   . Bronchitis, chronic ([place])   . Chronic back pain   . Collagenous colitis    followed by Dr Tiffany Kocher  . Depression   . Diplopia 11/15/2013  . Dysphagia   . Endometriosis   . Fibrocystic breast disease   . GERD (gastroesophageal reflux disease)   . History of colon polyps   . Hyperlipidemia   . Hypertension   . Hypokalemia   . IBS (irritable bowel syndrome)   . Memory difficulty 11/15/2013  . Migraine headache   . Pneumonia 2017   x2  . PONV (postoperative nausea and vomiting)    used patch  . Sleep apnea    recommended CPAP  . Ulcer disease   . Urine incontinence     Patient Active Problem List   Diagnosis Date Noted  . 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 Chronic thyroiditis 06/15/2016  . Sinusitis, chronic 02/11/2016  . Recurrent upper respiratory infection (URI) 02/11/2016  . Pneumonia 01/16/2016  . Anxiety 09/03/2015  . Ache in joint 09/03/2015  . Benign  hypertension 09/03/2015  . Endometriosis 09/03/2015  . Bloodgood disease 09/03/2015  . H/O disease 09/03/2015  . History of migraine headaches 09/03/2015  . H/O adenomatous polyp of colon 09/03/2015  . Decreased potassium in the blood 09/03/2015  . Adaptive colitis 09/03/2015  . History of surgical procedure 09/03/2015  . Apnea, sleep 09/03/2015  . Postprocedural state 09/03/2015  . Health care maintenance 08/06/2015  . History of colonic polyps 06/30/2015  . Can't get food down 05/25/2015  . Rash 05/05/2015  . Breast cancer screening 05/05/2015  . 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 Female genuine stress incontinence 09/04/2014  . Breast pain, right 02/15/2014  . Memory difficulty 11/15/2013  . Diplopia 11/15/2013  . 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 Incomplete bladder emptying 10/31/2013  . Must strain to pass urine 10/31/2013  . Infection of urinary tract 10/31/2013  . Depression 12/03/2012  . Essential hypertension, benign 12/03/2012  . Hypercholesterolemia 12/03/2012  . Obstructive sleep apnea 12/03/2012  . Collagenous colitis 12/03/2012  . DERMATOMYOSITIS 11/12/2009  . Sinusitis, acute 05/25/2007  . ALLERGIC RHINITIS 05/25/2007  . Asthma 05/25/2007  . GERD 05/25/2007  . Back pain 05/25/2007  . HEADACHE 05/25/2007    Past Surgical History:  Procedure Laterality Date  . ABDOMINAL HYSTERECTOMY     endometriosis  . BACK SURGERY     x6  . BREAST BIOPSY Right   .  COLONOSCOPY WITH PROPOFOL N/A 06/25/2015   Procedure: COLONOSCOPY WITH PROPOFOL;  Surgeon: Manya Silvas, MD;  Location: [facility] ENDOSCOPY;  Service: Endoscopy;  Laterality: N/A;  . DILATION AND CURETTAGE OF UTERUS     and hysteroscopy  . ESOPHAGOGASTRODUODENOSCOPY N/A 06/25/2015   Procedure: ESOPHAGOGASTRODUODENOSCOPY (EGD);  Surgeon: Manya Silvas, MD;  Location: [facility] ENDOSCOPY;  Service: Endoscopy;  Laterality: N/A;  . insertion of permanent spinal cord stimulator    . insertion of trial  spinal cord stimulator    . LUMBAR DISC SURGERY    . LUMBAR FUSION  4/05 and 8/07  . LUMBAR MICRODISCECTOMY  03/28/03   L4-5 L5  . MOUTH SURGERY    . NASAL SEPTOPLASTY W/ TURBINOPLASTY    . removal of spinal cord stimulator    . SEPTOPLASTY     with reduction of turbinates  . THYROIDECTOMY N/A 06/06/2016   Procedure: THYROIDECTOMY;  Surgeon: Robert Bellow, MD;  Location: [facility] ORS;  Service: General;  Laterality: N/A;    Prior to Admission medications   Medication Sig Start Date End Date Taking? Authorizing Provider  albuterol (PROVENTIL HFA;VENTOLIN HFA) 108 (90 Base) MCG/ACT inhaler Inhale 2 puffs into the lungs every 4 (four) hours as needed for wheezing or shortness of breath. 03/02/16   Vishal Mungal, MD  albuterol (PROVENTIL) (2.5 MG/3ML) 0.083% nebulizer solution Take 2.5 mg by nebulization every 4 (four) hours as needed for wheezing or shortness of breath.    Historical Provider, MD  calcium-vitamin D (OSCAL WITH D) 500-200 MG-UNIT tablet Take 2 tablets by mouth 2 (two) times daily. 06/07/16   Robert Bellow, MD  diazepam (VALIUM) 5 MG tablet Take 5 mg by mouth 3 (three) times daily as needed.      Historical Provider, MD  docusate sodium (COLACE) 100 MG capsule Take 100 mg by mouth 2 (two) times daily.    Historical Provider, MD  gabapentin (NEURONTIN) 800 MG tablet Take 800 mg by mouth 4 (four) times daily. 12/23/15   Historical Provider, MD  levothyroxine (SYNTHROID) 125 MCG tablet Take 1 tablet (125 mcg total) by mouth daily before breakfast. 09/23/16   Robert Bellow, MD  metoprolol (LOPRESSOR) 50 MG tablet TAKE 1/2 TABLET BY MOUTH TWICE A DAY (NEED OFFICE VISIT) 10/17/16   Einar Pheasant, MD  Multiple Vitamin (MULTIVITAMIN) capsule Take 1 capsule by mouth daily.      Historical Provider, MD  nystatin (NYSTATIN) powder Apply topically 2 (two) times daily. 10/13/16   Einar Pheasant, MD  nystatin cream (MYCOSTATIN) Apply 1 application topically 2 (two) times daily. 10/13/16    Einar Pheasant, MD  Omega-3 Fatty Acids (FISH OIL) 1200 MG CAPS Take 1 capsule by mouth daily.    Historical Provider, MD  Oxycodone HCl 10 MG TABS Take 10 mg by mouth 3 (three) times daily as needed. 04/02/15   Historical Provider, MD  pantoprazole (PROTONIX) 40 MG tablet TAKE 1 TABLET BY MOUTH EVERY DAY 08/26/16   Einar Pheasant, MD  potassium chloride (K-DUR) 10 MEQ tablet Take 1 tablet (10 mEq total) by mouth daily. 07/13/16   Einar Pheasant, MD  pravastatin (PRAVACHOL) 10 MG tablet TAKE 1 TABLET (10 MG TOTAL) BY MOUTH DAILY. 06/07/16   Einar Pheasant, MD  predniSONE (DELTASONE) 5 MG tablet Take 1 tablet (5 mg total) by mouth daily with breakfast. 07/12/16   Wilhelmina Mcardle, MD  RELISTOR 150 MG TABS TAKE 3 TABLETS BY MOUTH ONCE A DAY prn 10/20/15   Historical Provider, MD  Dellis Anes  80-4.5 MCG/ACT inhaler INHALE 2 PUFFS BY MOUTH TWICE A DAY IN THE MORNING AND EVENING 03/28/16   Vishal Mungal, MD  topiramate (TOPAMAX) 50 MG tablet TAKE 2 TABLETS BY MOUTH TWICE A DAY 06/14/15   Jolanta B Pucilowska, MD  triamcinolone cream (KENALOG) 0.1 % Apply 1 application topically 2 (two) times daily. Do not use more than 7-10 days in one area 05/04/15   Einar Pheasant, MD  UNABLE TO FIND Med Name: I Cool 1 daily for menopause    Historical Provider, MD  venlafaxine (EFFEXOR) 75 MG tablet Take 1 tablet (75 mg total) by mouth once. 09/21/16 09/21/16  Rainey Pines, MD  venlafaxine XR (EFFEXOR-XR) 150 MG 24 hr capsule Take 2 capsules (300 mg total) by mouth daily with breakfast. 09/21/16   Rainey Pines, MD  XTAMPZA ER 36 MG C12A TAKE 1 CAPSULE BY MOUTH EVERY 12 HOURS 12/25/15   Historical Provider, MD  zolpidem (AMBIEN) 10 MG tablet Take 10 mg by mouth at bedtime as needed.      Historical Provider, MD    Allergies Fentanyl; Sumatriptan; Doxycycline; Montelukast; Oxymorphone; Benzoin; Buspirone; Ciprofloxacin; Conjugated estrogens; Cymbalta [duloxetine hcl]; Duloxetine; Erythromycin ethylsuccinate; Estradiol; Metronidazole;  and Mirtazapine  Family History  Problem Relation Age of Onset  . Allergies Mother   . Hypertension Mother   . Arthritis Mother   . Hyperlipidemia Mother   . Stroke Mother   . Hypertension Father   . Arthritis Father   . Diabetes Father   . Hypertension Brother   . Diabetes Brother   . Arthritis Maternal Grandmother   . Hyperlipidemia Maternal Grandmother   . Hyperlipidemia Maternal Grandfather   . Arthritis Paternal Grandmother   . Arthritis Paternal Grandfather   . Asthma      several family member  . Leukemia      uncle  . Lymphoma Maternal Aunt     non hodgkins    Social History Social History  Substance Use Topics  . Smoking status: Never Smoker  . Smokeless tobacco: Never Used  . Alcohol use No    Review of Systems  Constitutional: Negative for fever. Cardiovascular: Positive for chest pain. Respiratory: Positive for shortness of breath. Gastrointestinal: Negative for abdominal pain, vomiting and diarrhea. Neurological: Negative for headaches, focal weakness or numbness.  10-point ROS otherwise negative.  ____________________________________________   PHYSICAL EXAM:  VITAL SIGNS: ED Triage Vitals [11/08/16 1751]  Enc Vitals Group     BP (!) 146/71     Pulse Rate 80     Resp 18     Temp 98 F (36.7 C)     Temp Source Oral     SpO2 97 %     Weight 217 lb (98.4 kg)     Height 5\' 7"  (1.702 m)    Constitutional: Alert and oriented. Well appearing and in no distress. Eyes: Conjunctivae are normal. Normal extraocular movements. ENT   Head: Normocephalic and atraumatic.   Nose: No congestion/rhinnorhea.   Mouth/Throat: Mucous membranes are moist.   Neck: No stridor. Hematological/Lymphatic/Immunilogical: No cervical lymphadenopathy. Cardiovascular: Normal rate, regular rhythm.  No murmurs, rubs, or gallops.  Respiratory: Normal respiratory effort without tachypnea nor retractions. Some wheezing appreciated  diffusely. Gastrointestinal: Soft and non tender. No rebound. No guarding.  Genitourinary: Deferred Musculoskeletal: Normal range of motion in all extremities. No lower extremity edema. Neurologic:  Normal speech and language. No gross focal neurologic deficits are appreciated.  Skin:  Skin is warm, dry and intact. No rash noted. Psychiatric: Mood  and affect are normal. Speech and behavior are normal. Patient exhibits appropriate insight and judgment.  ____________________________________________    LABS (pertinent positives/negatives)  Labs Reviewed  BASIC METABOLIC PANEL - Abnormal; Notable for the following:       Result Value   Potassium 3.3 (*)    Glucose, Bld 104 (*)    Calcium 8.4 (*)    All other components within normal limits  CBC  TROPONIN I  TROPONIN I     ____________________________________________   EKG  I, Nance Pear, attending physician, personally viewed and interpreted this EKG  EKG Time: 1758 Rate: 80 Rhythm: normal sinus rhythm Axis: normal Intervals: qtc 468 QRS: narrow, LVH, q waves V1 ST changes: no st elevation Impression: abnormal ekg   ____________________________________________    RADIOLOGY  CXR IMPRESSION: No acute cardiopulmonary disease. ____________________________________________   PROCEDURES  Procedures  ____________________________________________   INITIAL IMPRESSION / ASSESSMENT AND PLAN / ED COURSE  Pertinent labs & imaging results that were available during my care of the patient were reviewed by me and considered in my medical decision making (see chart for details).  Patient presented to the emergency department today with concerns for hypertension shortness breath and chest pain. 2 sets of troponin were negative here. I did have a chance to review the patient's blood pressure log and she has had elevated blood pressure at home. Will start patient on a low-dose antihypertensive medication. Discussed  patient's ports following up with primary care.  ____________________________________________   FINAL CLINICAL IMPRESSION(S) / ED DIAGNOSES  Final diagnoses:  Shortness of breath  Chest pain, unspecified type  Hypertension, unspecified type     Note: This dictation was prepared with Dragon dictation. Any transcriptional errors that result from this process are unintentional     Nance Pear, MD 11/08/16 2252

## 2016-11-08 NOTE — ED Triage Notes (Signed)
States left chest pain and SOB, states not feeling well for the past few weeks, states EMS gave a breathing tx which she feels helped, hx of asthma, states she took asa at home, awake and alert upon arrival

## 2016-11-09 ENCOUNTER — Other Ambulatory Visit: Payer: Self-pay | Admitting: Pulmonary Disease

## 2016-11-13 ENCOUNTER — Other Ambulatory Visit: Payer: Self-pay | Admitting: Psychiatry

## 2016-11-25 ENCOUNTER — Other Ambulatory Visit (HOSPITAL_COMMUNITY)
Admission: RE | Admit: 2016-11-25 | Discharge: 2016-11-25 | Disposition: A | Discharge status: Home / Self Care | Payer: Medicare Other | Source: Ambulatory Visit | Attending: Internal Medicine | Admitting: Internal Medicine

## 2016-11-25 ENCOUNTER — Encounter: Payer: Self-pay | Admitting: Internal Medicine

## 2016-11-25 ENCOUNTER — Ambulatory Visit (INDEPENDENT_AMBULATORY_CARE_PROVIDER_SITE_OTHER): Payer: Medicare Other | Admitting: Internal Medicine

## 2016-11-25 VITALS — BP 138/82 | HR 65 | Temp 98.6°F | Resp 65 | Ht 67.0 in | Wt 224.6 lb

## 2016-11-25 DIAGNOSIS — K219 Gastro-esophageal reflux disease without esophagitis: Secondary | ICD-10-CM | POA: Diagnosis not present

## 2016-11-25 DIAGNOSIS — R0789 Other chest pain: Secondary | ICD-10-CM | POA: Diagnosis not present

## 2016-11-25 DIAGNOSIS — Z1239 Encounter for other screening for malignant neoplasm of breast: Secondary | ICD-10-CM

## 2016-11-25 DIAGNOSIS — I10 Essential (primary) hypertension: Secondary | ICD-10-CM | POA: Diagnosis not present

## 2016-11-25 DIAGNOSIS — Z124 Encounter for screening for malignant neoplasm of cervix: Secondary | ICD-10-CM | POA: Diagnosis not present

## 2016-11-25 DIAGNOSIS — R0602 Shortness of breath: Secondary | ICD-10-CM | POA: Diagnosis not present

## 2016-11-25 DIAGNOSIS — Z0000 Encounter for general adult medical examination without abnormal findings: Secondary | ICD-10-CM | POA: Diagnosis not present

## 2016-11-25 DIAGNOSIS — N644 Mastodynia: Secondary | ICD-10-CM

## 2016-11-25 DIAGNOSIS — F329 Major depressive disorder, single episode, unspecified: Secondary | ICD-10-CM

## 2016-11-25 DIAGNOSIS — M545 Low back pain: Secondary | ICD-10-CM

## 2016-11-25 DIAGNOSIS — F32A Depression, unspecified: Secondary | ICD-10-CM

## 2016-11-25 DIAGNOSIS — E7800 Pure hypercholesterolemia, unspecified: Secondary | ICD-10-CM

## 2016-11-25 DIAGNOSIS — Z1231 Encounter for screening mammogram for malignant neoplasm of breast: Secondary | ICD-10-CM | POA: Diagnosis not present

## 2016-11-25 DIAGNOSIS — J4520 Mild intermittent asthma, uncomplicated: Secondary | ICD-10-CM

## 2016-11-25 NOTE — Progress Notes (Signed)
Pre-visit discussion using our clinic review tool. No additional management support is needed unless otherwise documented below in the visit note.  

## 2016-11-25 NOTE — Progress Notes (Signed)
Patient ID: [name], female   DOB: [birth year], 56 y.o.   MRN: [id number]   Subjective:    Patient ID: [name], female    DOB: June 02, 1961, 56 y.o.   MRN: [id number]  HPI  Patient here for a scheduled follow up.  She is s/p thyroidectomy.  States had her thyroid medication adjusted the last time it was checked.  She feels is still not right.  She is seeing Dr Alva Garnet for her pulmonary issues.  States was told had pneumonitis.  On prednisone 5mg  q day.  Has f/u planned 12/26/16.  She reports that her blood pressure has been elevated.  She was seen in ER recently for chest pain.  Described as a stabbing pain.  Also described that if felt like someone sitting on her chest.  ER evaluation unrevealing.  Had an appt with Dr Ubaldo Glassing the following day after her ER visit.  She cancelled the appt.  States that since her ER visit, she has still noticed intermittent chest pain - stabbing pain.  Also noticed the feeling of someone sitting on her chest.  This occurs with increased exertion.  Increased sob with exertion.  She is not very active.  No reported acid reflux.  No abdominal pain.  Bowels moving.  Still with chronic back pain.  Reports pain left breast - 9:00 region.     Past Medical History:  Diagnosis Date  . Allergic rhinitis   . Anxiety   . Arthralgia   . Asthma   . Bronchitis, chronic ([place])   . Chronic back pain   . Collagenous colitis    followed by Dr Tiffany Kocher  . Depression   . Diplopia 11/15/2013  . Dysphagia   . Endometriosis   . Fibrocystic breast disease   . GERD (gastroesophageal reflux disease)   . History of colon polyps   . Hyperlipidemia   . Hypertension   . Hypokalemia   . IBS (irritable bowel syndrome)   . Memory difficulty 11/15/2013  . Migraine headache   . Pneumonia 2017   x2  . PONV (postoperative nausea and vomiting)    used patch  . Sleep apnea    recommended CPAP  . Ulcer disease   . Urine incontinence    Past Surgical History:  Procedure Laterality Date   . ABDOMINAL HYSTERECTOMY     endometriosis  . BACK SURGERY     x6  . BREAST BIOPSY Right   . COLONOSCOPY WITH PROPOFOL N/A 06/25/2015   Procedure: COLONOSCOPY WITH PROPOFOL;  Surgeon: Manya Silvas, MD;  Location: [facility] ENDOSCOPY;  Service: Endoscopy;  Laterality: N/A;  . DILATION AND CURETTAGE OF UTERUS     and hysteroscopy  . ESOPHAGOGASTRODUODENOSCOPY N/A 06/25/2015   Procedure: ESOPHAGOGASTRODUODENOSCOPY (EGD);  Surgeon: Manya Silvas, MD;  Location: [facility] ENDOSCOPY;  Service: Endoscopy;  Laterality: N/A;  . insertion of permanent spinal cord stimulator    . insertion of trial spinal cord stimulator    . LUMBAR DISC SURGERY    . LUMBAR FUSION  4/05 and 8/07  . LUMBAR MICRODISCECTOMY  03/28/03   L4-5 L5  . MOUTH SURGERY    . NASAL SEPTOPLASTY W/ TURBINOPLASTY    . removal of spinal cord stimulator    . SEPTOPLASTY     with reduction of turbinates  . THYROIDECTOMY N/A 06/06/2016   Procedure: THYROIDECTOMY;  Surgeon: Robert Bellow, MD;  Location: [facility] ORS;  Service: General;  Laterality: N/A;   Family History  Problem Relation Age  of Onset  . Allergies Mother   . Hypertension Mother   . Arthritis Mother   . Hyperlipidemia Mother   . Stroke Mother   . Hypertension Father   . Arthritis Father   . Diabetes Father   . Hypertension Brother   . Diabetes Brother   . Arthritis Maternal Grandmother   . Hyperlipidemia Maternal Grandmother   . Hyperlipidemia Maternal Grandfather   . Arthritis Paternal Grandmother   . Arthritis Paternal Grandfather   . Asthma      several family member  . Leukemia      uncle  . Lymphoma Maternal Aunt     non hodgkins   Social History   Social History  . Marital status: Married    Spouse name: N/A  . Number of children: 1  . Years of education: 56 th   Occupational History  . Disabled   .  Disabled   Social History Main Topics  . Smoking status: Never Smoker  . Smokeless tobacco: Never Used  . Alcohol use No  . Drug use:  No  . Sexual activity: Not Currently   Other Topics Concern  . None   Social History Narrative  . None    Outpatient Encounter Prescriptions as of 11/25/2016  Medication Sig  . albuterol (PROVENTIL HFA;VENTOLIN HFA) 108 (90 Base) MCG/ACT inhaler Inhale 2 puffs into the lungs every 4 (four) hours as needed for wheezing or shortness of breath.  Marland Kitchen albuterol (PROVENTIL) (2.5 MG/3ML) 0.083% nebulizer solution Take 2.5 mg by nebulization every 4 (four) hours as needed for wheezing or shortness of breath.  . calcium-vitamin D (OSCAL WITH D) 500-200 MG-UNIT tablet Take 2 tablets by mouth 2 (two) times daily.  . diazepam (VALIUM) 5 MG tablet Take 5 mg by mouth 3 (three) times daily as needed.    . docusate sodium (COLACE) 100 MG capsule Take 100 mg by mouth 2 (two) times daily.  Marland Kitchen gabapentin (NEURONTIN) 800 MG tablet Take 800 mg by mouth 4 (four) times daily.  . hydrochlorothiazide (HYDRODIURIL) 12.5 MG tablet Take 1 tablet (12.5 mg total) by mouth daily.  Marland Kitchen levothyroxine (SYNTHROID) 125 MCG tablet Take 1 tablet (125 mcg total) by mouth daily before breakfast.  . metoprolol (LOPRESSOR) 50 MG tablet TAKE 1/2 TABLET BY MOUTH TWICE A DAY (NEED OFFICE VISIT)  . Multiple Vitamin (MULTIVITAMIN) capsule Take 1 capsule by mouth daily.    Marland Kitchen nystatin (NYSTATIN) powder Apply topically 2 (two) times daily.  Marland Kitchen nystatin cream (MYCOSTATIN) Apply 1 application topically 2 (two) times daily.  . Omega-3 Fatty Acids (FISH OIL) 1200 MG CAPS Take 1 capsule by mouth daily.  . Oxycodone HCl 10 MG TABS Take 10 mg by mouth 3 (three) times daily as needed.  . pantoprazole (PROTONIX) 40 MG tablet TAKE 1 TABLET BY MOUTH EVERY DAY  . potassium chloride (K-DUR) 10 MEQ tablet Take 1 tablet (10 mEq total) by mouth daily.  . pravastatin (PRAVACHOL) 10 MG tablet TAKE 1 TABLET (10 MG TOTAL) BY MOUTH DAILY.  Marland Kitchen predniSONE (DELTASONE) 5 MG tablet Take 1 tablet (5 mg total) by mouth daily with breakfast.  . RELISTOR 150 MG TABS TAKE 3  TABLETS BY MOUTH ONCE A DAY prn  . SYMBICORT 80-4.5 MCG/ACT inhaler INHALE 2 PUFFS BY MOUTH TWICE A DAY IN THE MORNING AND EVENING  . topiramate (TOPAMAX) 50 MG tablet TAKE 2 TABLETS BY MOUTH TWICE A DAY  . triamcinolone cream (KENALOG) 0.1 % Apply 1 application topically 2 (two) times  daily. Do not use more than 7-10 days in one area  . UNABLE TO FIND Med Name: I Cool 1 daily for menopause  . venlafaxine XR (EFFEXOR-XR) 150 MG 24 hr capsule TAKE 2 CAPSULES (300 MG TOTAL) BY MOUTH DAILY WITH BREAKFAST.  Marland Kitchen XTAMPZA ER 36 MG C12A TAKE 1 CAPSULE BY MOUTH EVERY 12 HOURS  . zolpidem (AMBIEN) 10 MG tablet Take 10 mg by mouth at bedtime as needed.    . venlafaxine (EFFEXOR) 75 MG tablet TAKE 1 TABLET (75 MG TOTAL) BY MOUTH ONCE.   No facility-administered encounter medications on file as of 11/25/2016.     Review of Systems  Constitutional: Positive for fatigue. Negative for appetite change.  HENT: Negative for congestion, sinus pressure and sore throat.   Eyes: Negative for pain and visual disturbance.  Respiratory: Positive for chest tightness and shortness of breath. Negative for cough.   Cardiovascular: Positive for chest pain. Negative for palpitations and leg swelling.  Gastrointestinal: Negative for abdominal pain, diarrhea, nausea and vomiting.  Genitourinary: Negative for difficulty urinating and dysuria.  Musculoskeletal: Positive for back pain. Negative for joint swelling.  Skin: Negative for color change and rash.  Neurological: Negative for dizziness, light-headedness and headaches.  Hematological: Negative for adenopathy. Does not bruise/bleed easily.  Psychiatric/Behavioral: Negative for agitation and dysphoric mood.       Objective:    Physical Exam  Constitutional: She appears well-developed and well-nourished. No distress.  HENT:  Nose: Nose normal.  Mouth/Throat: Oropharynx is clear and moist.  Eyes: Conjunctivae are normal. Right eye exhibits no discharge. Left eye  exhibits no discharge.  Neck: Neck supple. No thyromegaly present.  Cardiovascular: Normal rate and regular rhythm.   Pulmonary/Chest: Effort normal and breath sounds normal. No respiratory distress.  Abdominal: Soft. Bowel sounds are normal. There is no tenderness.  Genitourinary:  Genitourinary Comments: Normal external genitalia.  Vaginal vault without lesions.  Cervix identified.  Pap smear performed.  Could not appreciate any adnexal masses or tenderness.    Musculoskeletal: She exhibits no edema or deformity.  Lymphadenopathy:    She has no cervical adenopathy.  Skin: Skin is warm and dry. No erythema.  Psychiatric: She has a normal mood and affect. Her behavior is normal.    BP 138/82 (BP Location: Left Arm, Patient Position: Sitting, Cuff Size: Normal)   Pulse 65   Temp 98.6 F (37 C) (Oral)   Resp (!) 65   Ht 5\' 7"  (1.702 m)   Wt 224 lb 9.6 oz (101.9 kg)   SpO2 98%   BMI 35.18 kg/m  Wt Readings from Last 3 Encounters:  11/25/16 224 lb 9.6 oz (101.9 kg)  11/08/16 217 lb (98.4 kg)  09/22/16 217 lb (98.4 kg)     Lab Results  Component Value Date   WBC 9.2 11/08/2016   HGB 13.6 11/08/2016   HCT 39.1 11/08/2016   PLT 254 11/08/2016   GLUCOSE 104 (H) 11/08/2016   CHOL 215 (H) 07/12/2016   TRIG 202 (H) 07/12/2016   HDL 54 07/12/2016   LDLCALC 121 (H) 07/12/2016   ALT 19 07/12/2016   AST 25 07/12/2016   NA 139 11/08/2016   K 3.3 (L) 11/08/2016   CL 105 11/08/2016   CREATININE 0.72 11/08/2016   BUN 13 11/08/2016   CO2 28 11/08/2016   TSH 4.820 (H) 09/22/2016    Dg Chest 2 View  Result Date: 11/08/2016 CLINICAL DATA:  Chest pain and shortness of breath. EXAM: CHEST  2 VIEW  COMPARISON:  07/12/2016 FINDINGS: Mild hyperinflation. Mild right hemidiaphragm elevation. Dorsal spinal stimulator. Patient rotated right. Midline trachea. Normal heart size and mediastinal contours. No pleural effusion or pneumothorax. Clear lungs. IMPRESSION: No acute cardiopulmonary  disease. Electronically Signed   By: Abigail Miyamoto M.D.   On: 11/08/2016 18:32       Assessment & Plan:   Problem List Items Addressed This Visit    Asthma    Known history of asthma.  Seeing pulmonary.  On low dose daily prednisone.  Continue inhalers.  Keep f/u with  Dr Alva Garnet.        Back pain    Chronic back pain.  Followed by pain clinic.  They are trying to decrease some of her medications.  Follow.        Chest tightness    Describes the intermittent chest pain and tightness as outlined.  Evaluated recently in the ER.  EKG as outlined in ER note.  Discussed repeat EKG today.  She declines.  Discussed the need for further cardiac w/up.  She is in agreement.  Schedule appt with Dr Ubaldo Glassing.        Relevant Orders   Ambulatory referral to Cardiology   Depression    Has been followed by psychiatry.       Essential hypertension, benign    Blood pressure elevated on her outside checks.  Rechecked blood pressure here.  Did not correlate with her cuff.  Her parents have a cuff.  Have her spot check and send in readings.  Get her back in soon to reassess blood pressure.  If elevation, will need to adjust medications.        Relevant Orders   TSH   Basic metabolic panel   GERD    Appears to be stable.  On protonix.        Health care maintenance    Physical today 11/25/16.  PAP 11/25/16.  Schedule mammogram.  Colonoscopy 06/2015.  Recommended f/u in 06/2020.        Hypercholesterolemia    On pravastatin.  Low cholesterol diet and exercise.  Follow lipid panel.        Relevant Orders   Lipid panel   Hepatic function panel   SOB (shortness of breath)    Probably multifactorial.  Pursue cardiac w/up as outlined.  Seeing pulmonary.  Continue low dose prednisone and inhalers as she is doing.  Also has decreased stamina.  Is not active.  Had cxr in ER.  Followed by Dr Alva Garnet.  Diagnosed with pneumonitis.        Relevant Orders   Ambulatory referral to Cardiology    Other Visit  Diagnoses    Breast pain, left    -  Primary   Left breast pain 9:00 region.  schedule diagnostic mammogram with possible ultrasound.     Relevant Orders   MM Digital Diagnostic Bilat   US BREAST LTD UNI LEFT INC AXILLA   US BREAST LTD UNI RIGHT INC AXILLA   Screening for breast cancer       Screening for cervical cancer       Relevant Orders   Cytology - PAP   Routine general medical examination at a health care facility         I spent 25 minutes with the patient and more than 50% of the time was spent in consultation regarding the above.  Time spent obtaining her history and symptoms, presentation and w/up in the ER.  Also discussed  her other health issues and treatment plan as well as plan for further w/up.    Einar Pheasant, MD

## 2016-11-26 ENCOUNTER — Encounter: Payer: Self-pay | Admitting: Internal Medicine

## 2016-11-26 NOTE — Assessment & Plan Note (Signed)
Has been followed by psychiatry.   

## 2016-11-26 NOTE — Assessment & Plan Note (Signed)
Describes the intermittent chest pain and tightness as outlined.  Evaluated recently in the ER.  EKG as outlined in ER note.  Discussed repeat EKG today.  She declines.  Discussed the need for further cardiac w/up.  She is in agreement.  Schedule appt with Dr Ubaldo Glassing.

## 2016-11-26 NOTE — Assessment & Plan Note (Signed)
On pravastatin.  Low cholesterol diet and exercise.  Follow lipid panel.   

## 2016-11-26 NOTE — Assessment & Plan Note (Addendum)
Physical today 11/25/16.  PAP 11/25/16.  Schedule mammogram.  Colonoscopy 06/2015.  Recommended f/u in 06/2020.

## 2016-11-26 NOTE — Assessment & Plan Note (Signed)
Known history of asthma.  Seeing pulmonary.  On low dose daily prednisone.  Continue inhalers.  Keep f/u with  Dr Alva Garnet.

## 2016-11-26 NOTE — Assessment & Plan Note (Signed)
Probably multifactorial.  Pursue cardiac w/up as outlined.  Seeing pulmonary.  Continue low dose prednisone and inhalers as she is doing.  Also has decreased stamina.  Is not active.  Had cxr in ER.  Followed by Dr Alva Garnet.  Diagnosed with pneumonitis.

## 2016-11-26 NOTE — Assessment & Plan Note (Signed)
Blood pressure elevated on her outside checks.  Rechecked blood pressure here.  Did not correlate with her cuff.  Her parents have a cuff.  Have her spot check and send in readings.  Get her back in soon to reassess blood pressure.  If elevation, will need to adjust medications.

## 2016-11-26 NOTE — Assessment & Plan Note (Signed)
Appears to be stable.  On protonix.

## 2016-11-26 NOTE — Assessment & Plan Note (Signed)
Chronic back pain.  Followed by pain clinic.  They are trying to decrease some of her medications.  Follow.

## 2016-11-27 ENCOUNTER — Other Ambulatory Visit: Payer: Self-pay | Admitting: Internal Medicine

## 2016-11-29 DIAGNOSIS — G4700 Insomnia, unspecified: Secondary | ICD-10-CM | POA: Diagnosis not present

## 2016-11-29 DIAGNOSIS — M47817 Spondylosis without myelopathy or radiculopathy, lumbosacral region: Secondary | ICD-10-CM | POA: Diagnosis not present

## 2016-11-29 DIAGNOSIS — G894 Chronic pain syndrome: Secondary | ICD-10-CM | POA: Diagnosis not present

## 2016-11-29 DIAGNOSIS — M62830 Muscle spasm of back: Secondary | ICD-10-CM | POA: Diagnosis not present

## 2016-11-29 LAB — CYTOLOGY - PAP
Diagnosis: NEGATIVE
HPV: NOT DETECTED

## 2016-12-01 ENCOUNTER — Other Ambulatory Visit: Payer: Medicare Other

## 2016-12-01 ENCOUNTER — Other Ambulatory Visit: Payer: Self-pay | Admitting: Internal Medicine

## 2016-12-01 DIAGNOSIS — N644 Mastodynia: Secondary | ICD-10-CM

## 2016-12-01 NOTE — Progress Notes (Signed)
Order placed for tomo mammogram.   

## 2016-12-02 ENCOUNTER — Encounter: Payer: Self-pay | Admitting: Internal Medicine

## 2016-12-05 ENCOUNTER — Ambulatory Visit: Payer: Medicare Other | Admitting: Psychiatry

## 2016-12-05 ENCOUNTER — Other Ambulatory Visit: Payer: Self-pay

## 2016-12-05 DIAGNOSIS — D34 Benign neoplasm of thyroid gland: Secondary | ICD-10-CM

## 2016-12-06 ENCOUNTER — Telehealth: Payer: Self-pay | Admitting: Internal Medicine

## 2016-12-06 DIAGNOSIS — R300 Dysuria: Secondary | ICD-10-CM

## 2016-12-06 NOTE — Telephone Encounter (Signed)
Confirm with pt that she has no other symptoms (vomiting, etc).  If no other acute symptoms and since recently seen,  Can add urine.  Just let me know and can add.

## 2016-12-06 NOTE — Telephone Encounter (Signed)
Reason for call: dsyuria  Symptoms: dysuria, constipated as well , urine cloudy,  Duration 1 week  Medications: Last seen for this problem: Seen by: Coming in for labs  See message , no follow up appointment to go over labs Please advise.

## 2016-12-06 NOTE — Telephone Encounter (Signed)
Pt called and was wondering if Dr. Nicki Reaper could add a UA to her lab work that she is coming in for on Thursday. Pt states that she feels as though she has a uti. She is c/o unable to go much and burning when she does urinate. Please advise, thank you!  Call pt @ [phone]

## 2016-12-06 NOTE — Telephone Encounter (Signed)
Called patient back confirmed no nausea, just myalgias due to muscle disease and she is complaining of urinary frequency.

## 2016-12-07 NOTE — Telephone Encounter (Signed)
Patient advised of below and verbalized understanding.  

## 2016-12-07 NOTE — Telephone Encounter (Signed)
I have added the urinalysis and urine culture to labs to be done.

## 2016-12-08 ENCOUNTER — Other Ambulatory Visit (INDEPENDENT_AMBULATORY_CARE_PROVIDER_SITE_OTHER): Payer: Medicare Other

## 2016-12-08 DIAGNOSIS — R7989 Other specified abnormal findings of blood chemistry: Secondary | ICD-10-CM

## 2016-12-08 DIAGNOSIS — I10 Essential (primary) hypertension: Secondary | ICD-10-CM

## 2016-12-08 DIAGNOSIS — R300 Dysuria: Secondary | ICD-10-CM | POA: Diagnosis not present

## 2016-12-08 DIAGNOSIS — E7800 Pure hypercholesterolemia, unspecified: Secondary | ICD-10-CM

## 2016-12-08 LAB — URINALYSIS, ROUTINE W REFLEX MICROSCOPIC
Bilirubin Urine: NEGATIVE
Hgb urine dipstick: NEGATIVE
Ketones, ur: NEGATIVE
Leukocytes, UA: NEGATIVE
Nitrite: NEGATIVE
Specific Gravity, Urine: 1.02 (ref 1.000–1.030)
Urine Glucose: NEGATIVE
Urobilinogen, UA: 0.2 (ref 0.0–1.0)
pH: 7 (ref 5.0–8.0)

## 2016-12-08 LAB — BASIC METABOLIC PANEL
BUN: 9 mg/dL (ref 6–23)
CO2: 34 mEq/L — ABNORMAL HIGH (ref 19–32)
Calcium: 9 mg/dL (ref 8.4–10.5)
Chloride: 104 mEq/L (ref 96–112)
Creatinine, Ser: 0.75 mg/dL (ref 0.40–1.20)
GFR: 84.42 mL/min (ref >60.00)
Glucose, Bld: 92 mg/dL (ref 70–99)
Potassium: 3.6 mEq/L (ref 3.5–5.1)
Sodium: 145 mEq/L (ref 135–145)

## 2016-12-08 LAB — HEPATIC FUNCTION PANEL
ALT: 20 U/L (ref 0–35)
AST: 21 U/L (ref 0–37)
Albumin: 4.1 g/dL (ref 3.5–5.2)
Alkaline Phosphatase: 71 U/L (ref 39–117)
Bilirubin, Direct: 0.1 mg/dL (ref 0.0–0.3)
Total Bilirubin: 0.6 mg/dL (ref 0.2–1.2)
Total Protein: 6.8 g/dL (ref 6.0–8.3)

## 2016-12-08 LAB — LDL CHOLESTEROL, DIRECT: Direct LDL: 127 mg/dL

## 2016-12-08 LAB — LIPID PANEL
Cholesterol: 227 mg/dL — ABNORMAL HIGH (ref 0–200)
HDL: 62.2 mg/dL (ref >39.00)
NonHDL: 164.66
Total CHOL/HDL Ratio: 4
Triglycerides: 226 mg/dL — ABNORMAL HIGH (ref 0.0–149.0)
VLDL: 45.2 mg/dL — ABNORMAL HIGH (ref 0.0–40.0)

## 2016-12-08 LAB — TSH: TSH: 3.38 u[IU]/mL (ref 0.35–4.50)

## 2016-12-09 ENCOUNTER — Encounter: Payer: Self-pay | Admitting: Internal Medicine

## 2016-12-10 LAB — URINE CULTURE

## 2016-12-11 ENCOUNTER — Other Ambulatory Visit: Payer: Self-pay | Admitting: Internal Medicine

## 2016-12-11 ENCOUNTER — Encounter: Payer: Self-pay | Admitting: Internal Medicine

## 2016-12-12 ENCOUNTER — Other Ambulatory Visit: Payer: Self-pay | Admitting: *Deleted

## 2016-12-12 ENCOUNTER — Other Ambulatory Visit: Payer: Self-pay | Admitting: Internal Medicine

## 2016-12-12 DIAGNOSIS — I10 Essential (primary) hypertension: Secondary | ICD-10-CM | POA: Diagnosis not present

## 2016-12-12 DIAGNOSIS — G4733 Obstructive sleep apnea (adult) (pediatric): Secondary | ICD-10-CM | POA: Diagnosis not present

## 2016-12-12 DIAGNOSIS — R079 Chest pain, unspecified: Secondary | ICD-10-CM | POA: Diagnosis not present

## 2016-12-12 DIAGNOSIS — R0602 Shortness of breath: Secondary | ICD-10-CM | POA: Diagnosis not present

## 2016-12-12 DIAGNOSIS — R0789 Other chest pain: Secondary | ICD-10-CM | POA: Diagnosis not present

## 2016-12-12 MED ORDER — ALBUTEROL SULFATE HFA 108 (90 BASE) MCG/ACT IN AERS: 2.0000 | INHALATION_SPRAY | RESPIRATORY_TRACT | 3 refills | Status: DC | PRN

## 2016-12-15 DIAGNOSIS — R079 Chest pain, unspecified: Secondary | ICD-10-CM | POA: Insufficient documentation

## 2016-12-21 ENCOUNTER — Other Ambulatory Visit: Payer: Medicare Other

## 2016-12-22 ENCOUNTER — Other Ambulatory Visit: Payer: Medicare Other

## 2016-12-26 ENCOUNTER — Other Ambulatory Visit: Payer: Self-pay | Admitting: Internal Medicine

## 2016-12-26 ENCOUNTER — Encounter: Payer: Self-pay | Admitting: Pulmonary Disease

## 2016-12-26 ENCOUNTER — Other Ambulatory Visit
Admission: RE | Admit: 2016-12-26 | Discharge: 2016-12-26 | Disposition: A | Discharge status: Home / Self Care | Payer: Medicare Other | Source: Ambulatory Visit | Attending: Pulmonary Disease | Admitting: Pulmonary Disease

## 2016-12-26 ENCOUNTER — Ambulatory Visit (INDEPENDENT_AMBULATORY_CARE_PROVIDER_SITE_OTHER): Payer: Medicare Other | Admitting: Pulmonary Disease

## 2016-12-26 VITALS — BP 148/90 | HR 84 | Resp 16 | Ht 64.0 in | Wt 224.0 lb

## 2016-12-26 DIAGNOSIS — J4540 Moderate persistent asthma, uncomplicated: Secondary | ICD-10-CM | POA: Diagnosis not present

## 2016-12-26 DIAGNOSIS — R0600 Dyspnea, unspecified: Secondary | ICD-10-CM

## 2016-12-26 DIAGNOSIS — R079 Chest pain, unspecified: Secondary | ICD-10-CM

## 2016-12-26 DIAGNOSIS — R0609 Other forms of dyspnea: Secondary | ICD-10-CM | POA: Diagnosis not present

## 2016-12-26 DIAGNOSIS — D721 Eosinophilia, unspecified: Secondary | ICD-10-CM

## 2016-12-26 LAB — CBC WITH DIFFERENTIAL/PLATELET
Basophils Absolute: 0 10*3/uL (ref 0–0.1)
Basophils Relative: 0 %
Eosinophils Absolute: 0.6 10*3/uL (ref 0–0.7)
Eosinophils Relative: 7 %
HCT: 43.6 % (ref 35.0–47.0)
Hemoglobin: 14.6 g/dL (ref 12.0–16.0)
Lymphocytes Relative: 18 %
Lymphs Abs: 1.8 10*3/uL (ref 1.0–3.6)
MCH: 30 pg (ref 26.0–34.0)
MCHC: 33.6 g/dL (ref 32.0–36.0)
MCV: 89.1 fL (ref 80.0–100.0)
Monocytes Absolute: 0.6 10*3/uL (ref 0.2–0.9)
Monocytes Relative: 6 %
Neutro Abs: 6.8 10*3/uL — ABNORMAL HIGH (ref 1.4–6.5)
Neutrophils Relative %: 69 %
Platelets: 299 10*3/uL (ref 150–440)
RBC: 4.89 MIL/uL (ref 3.80–5.20)
RDW: 12.4 % (ref 11.5–14.5)
WBC: 9.8 10*3/uL (ref 3.6–11.0)

## 2016-12-26 NOTE — Patient Instructions (Signed)
Blood work today - CBC with differential  No changes in treatment today - continue Symbicort and prednisone at current dose  Follow up in 4-6 weeks (after the cardiology evaluation is completed) at which time we will discuss further options in evaluation and management including the possibility of biological therapy for the elevated eosinophils in your blood

## 2016-12-26 NOTE — Progress Notes (Signed)
PROBLEMS: Dermatomyositis Asthma Evanescent pulmonary infiltrates  DATA: 03/17/11 CT chest: Atelectasis versus infiltrate within the superior segments of the right upper and right lower lobes 11/16/15: Patchy infiltrate left upper lobe consistent with pneumonia 01/14/16 CXR: Patchy alveolar opacities in the left upper lobe 04/07/16 PFTs: mod-severe obstruction with substantial improvement after bronchodilator (FEV1 1.37 > 1.83 liters, 46-62% pred), mild restriction, normal DLCO 04/07/16 6MWT: 192 meters. More limited by LE weakness 04/20/16 HRCT chest: Extensive air trapping, indicating small airways disease. Mild diffuse bronchial wall thickening. Mild patchy regions of ground-glass attenuation in the mid to upper lungs bilaterally. 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 04/25/16 Office spiro: FEV1 2.15 liters (73%) 07/12/16 Respiratory symptoms deteriorated gradually since last course of prednisone. Progressive SOB, increased wheezing and rescue inhaler use X 2-3 weeks.   INTERVAL HISTORY: ED visit 11/08/16 for shortness of breath, chest pain, hypertension  SUBJ: This is a routine reevaluation. However, patient was seen in the emergency department as noted above. She describes moderate to severe exertional dyspnea with chest tightness and heaviness. She also describes significant diaphoresis. She is presently on Symbicort inhaler and prednisone 5 mg per day. She is compliant with these medications. She uses her albuterol rescue inhaler 1-2 times per day on average.Denies fever, purulent sputum, hemoptysis, LE edema and calf tenderness.   OBJ: Vitals:   12/26/16 1404 12/26/16 1406  BP:  (!) 148/90  Pulse:  84  Resp: 16 16  SpO2:  93%  Weight: 224 lb (101.6 kg) 224 lb (101.6 kg)  Height: 5\' 7"  (1.702 m) 5\' 4"  (1.626 m)    Gen: NAD at rest HEENT: WNL Neck: NO LAN, no JVD noted Lungs: full BS, normal percussion, scattered inspiratory  wheezes Cardiovascular: Reg, no M noted Abdomen: Centripetal obesity, soft, NT +BS Ext: no C/C/E Neuro: CNs intact, motor/sens grossly intact Skin: No lesions noted  DATA CXR (11/08/16): No acute cardiopulmonary disease noted   IMPRESSION: Eosinophilia - Plan: CBC w/Diff  Moderate persistent asthma, unspecified whether complicated - Plan: CBC w/Diff  DOE (dyspnea on exertion)  Chest pain, unspecified type 1) Dermatomyositis 2) History of recurrent "pneumonias" 3) Moderate persistent asthma with acute exacerbation/bronchiolitis 4) CBC with differential 06/2016 revealed significant eosinophilia 5) worsening exertional dyspnea with chest heaviness - presently undergoing evaluation by Dr. Ubaldo Glassing   PLAN: 1) repeat CBC with diff ordered this visit 2) Continue Symbicort and PRN albuterol 3) continue prednisone 5 mg per day 4) after completion of a cardiac evaluation, in 4-6 weeks, she is to return for further evaluation which will include office spirometry, review of CBC with differential, consideration of a biological therapy for asthma with eosinophilia  Merton Border, MD PCCM service Mobile [phone] Pager [phone] 12/26/2016 2:43 PM

## 2016-12-27 DIAGNOSIS — G894 Chronic pain syndrome: Secondary | ICD-10-CM | POA: Diagnosis not present

## 2016-12-27 DIAGNOSIS — M47817 Spondylosis without myelopathy or radiculopathy, lumbosacral region: Secondary | ICD-10-CM | POA: Diagnosis not present

## 2016-12-27 DIAGNOSIS — G4700 Insomnia, unspecified: Secondary | ICD-10-CM | POA: Diagnosis not present

## 2016-12-27 DIAGNOSIS — M62830 Muscle spasm of back: Secondary | ICD-10-CM | POA: Diagnosis not present

## 2017-01-05 IMAGING — CR DG CHEST 2V
1 series · 2 of 2 positions shown · non-contrast
Comparison: 01/14/2016

CLINICAL DATA: Short of breath for 1 week

EXAM:
CHEST  2 VIEW

[Series 1: dg chest 2 view · 0.14mm/px · 2 of 2 slices shown]
[im 1/2]
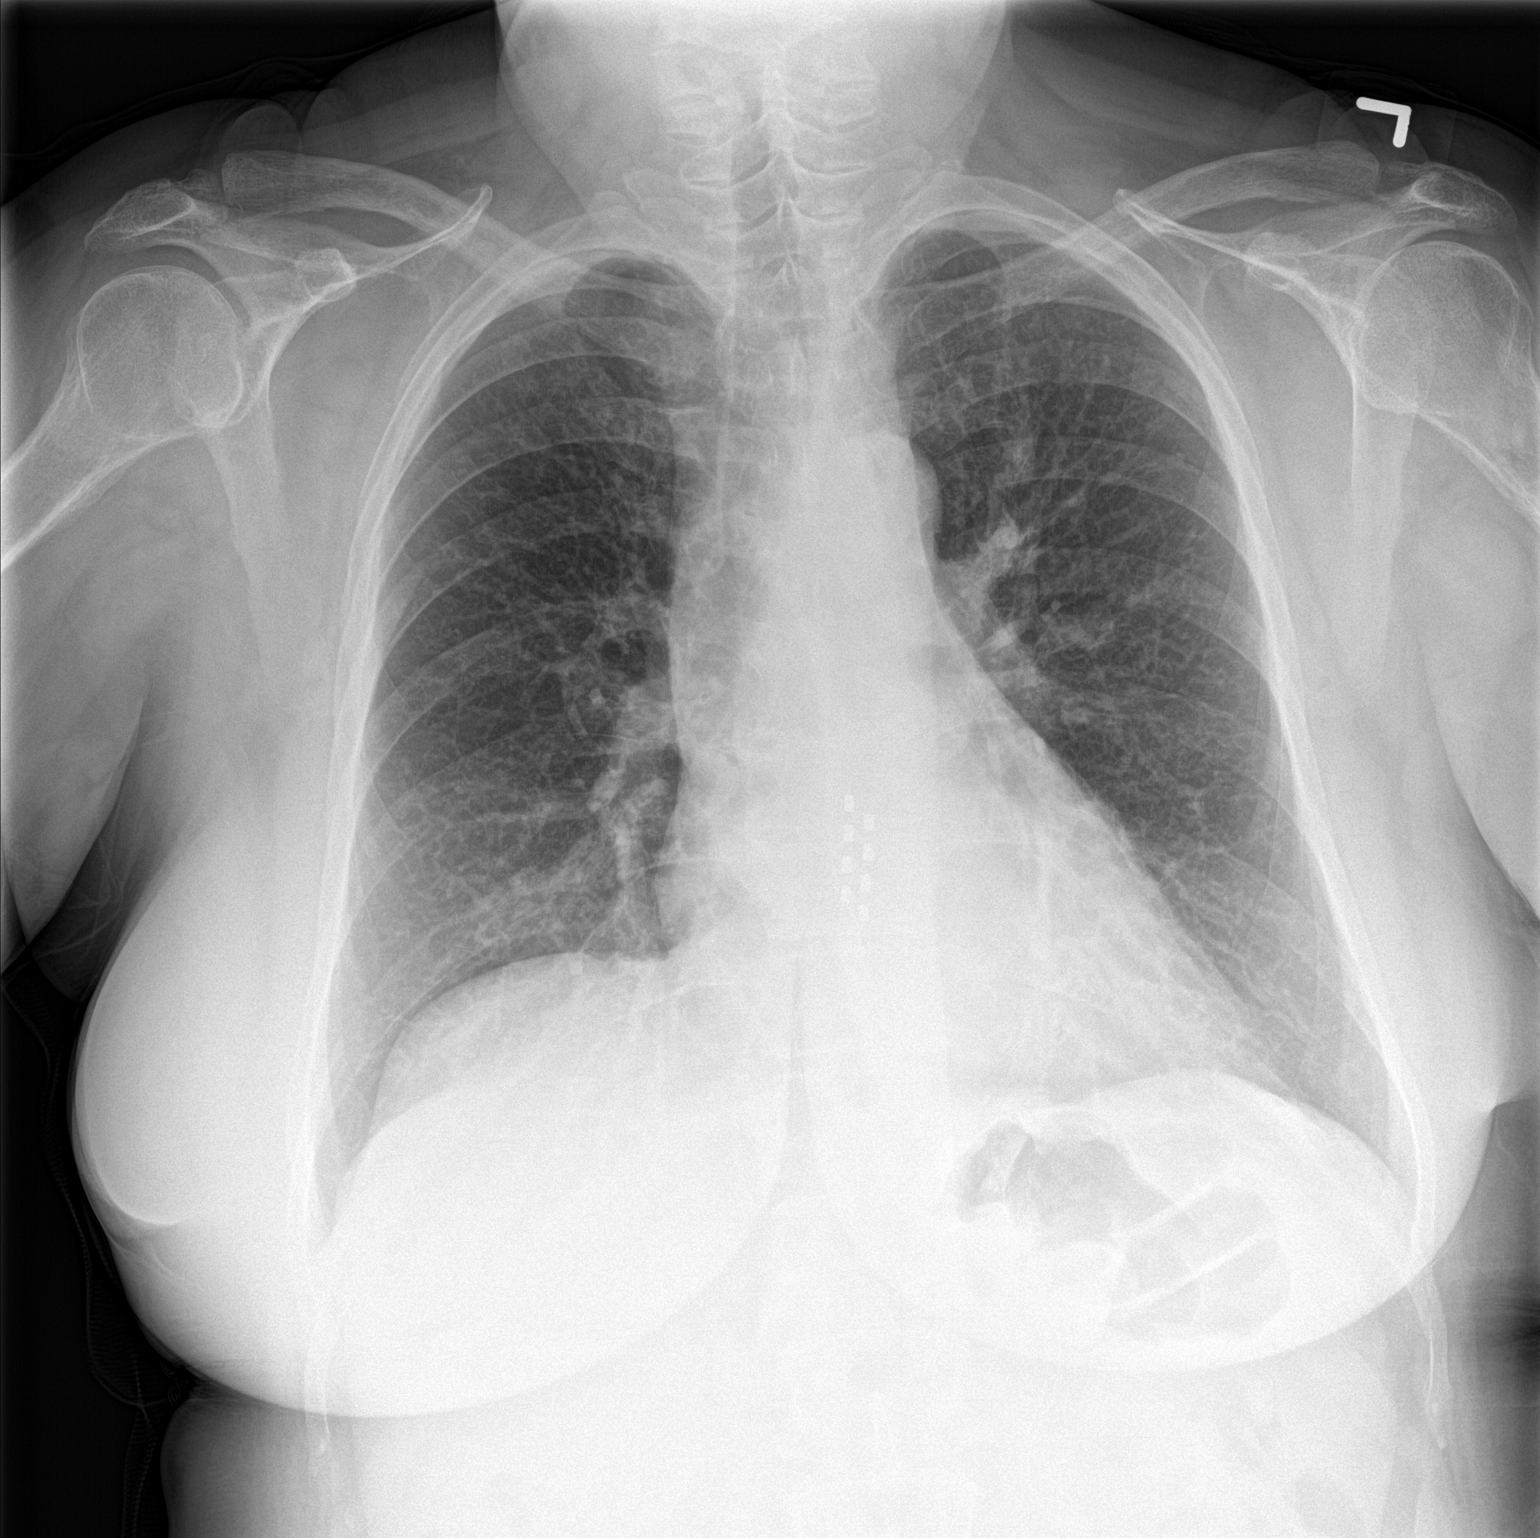
[im 2/2]
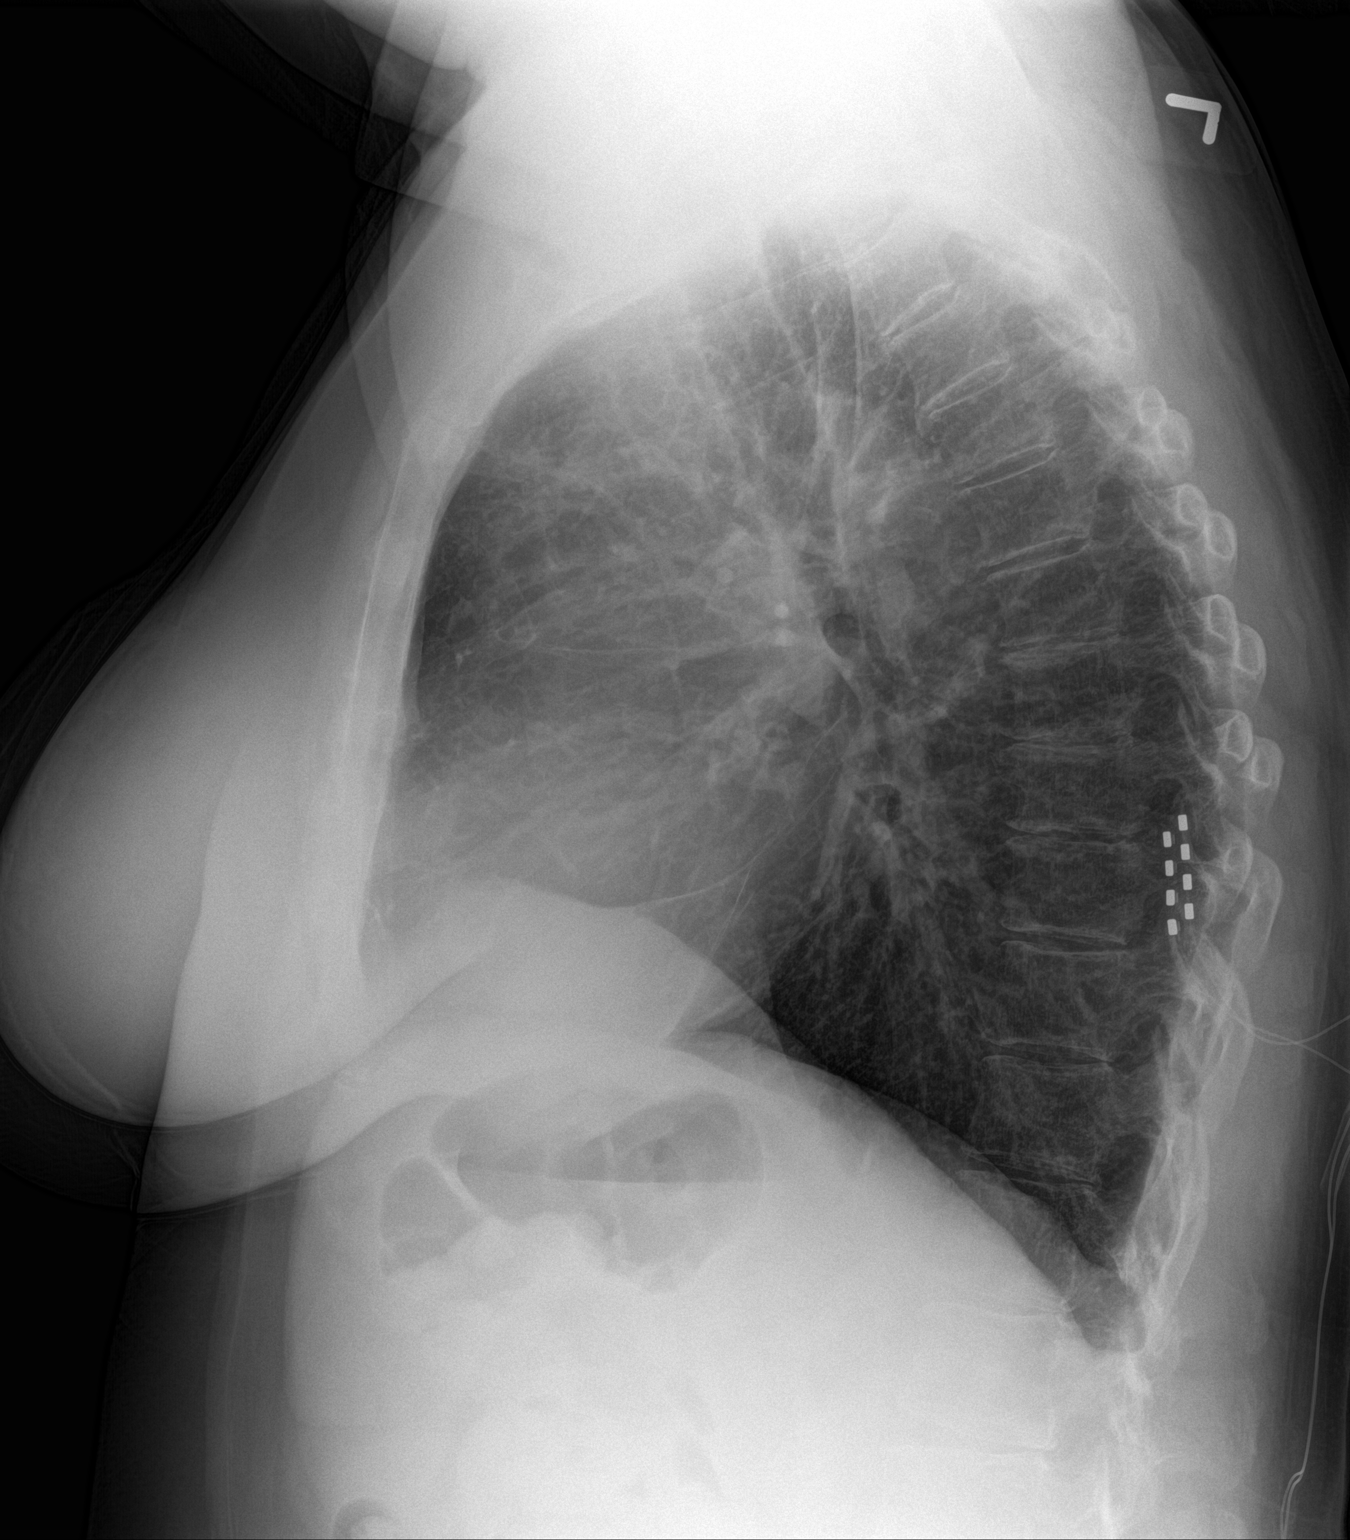

[2 of 2 positions shown; findings below may reference images not displayed]

FINDINGS: Upper normal heart size. Lungs clear. Spinal stimulator in the mid
thoracic spinal canal is stable. No pneumothorax or pleural
effusion. There is chronic blunting of the posterior costophrenic
angles.
IMPRESSION: No active cardiopulmonary disease.

## 2017-01-07 ENCOUNTER — Other Ambulatory Visit: Payer: Self-pay | Admitting: Psychiatry

## 2017-01-10 ENCOUNTER — Other Ambulatory Visit: Payer: Self-pay | Admitting: Psychiatry

## 2017-01-10 DIAGNOSIS — R0789 Other chest pain: Secondary | ICD-10-CM | POA: Diagnosis not present

## 2017-01-10 DIAGNOSIS — R079 Chest pain, unspecified: Secondary | ICD-10-CM | POA: Diagnosis not present

## 2017-01-10 DIAGNOSIS — R0602 Shortness of breath: Secondary | ICD-10-CM | POA: Diagnosis not present

## 2017-01-11 ENCOUNTER — Ambulatory Visit: Payer: Medicare Other | Admitting: Psychiatry

## 2017-01-16 DIAGNOSIS — I10 Essential (primary) hypertension: Secondary | ICD-10-CM | POA: Diagnosis not present

## 2017-01-16 DIAGNOSIS — R0602 Shortness of breath: Secondary | ICD-10-CM | POA: Diagnosis not present

## 2017-01-16 DIAGNOSIS — G4733 Obstructive sleep apnea (adult) (pediatric): Secondary | ICD-10-CM | POA: Diagnosis not present

## 2017-01-18 ENCOUNTER — Ambulatory Visit: Payer: Medicare Other | Admitting: Psychiatry

## 2017-01-19 ENCOUNTER — Telehealth: Payer: Self-pay

## 2017-01-19 NOTE — Telephone Encounter (Signed)
Pt needs refill on effexor.

## 2017-01-19 NOTE — Telephone Encounter (Signed)
pt had an appt to see dr. Gretel Acre yesterday but dr. Gretel Acre called out of work. pt was r/s for 02-01-17 but pt is out of medication . will you please send in enough exxerxor  to do until her next appt.

## 2017-01-19 NOTE — Telephone Encounter (Signed)
Patient is on an unusually high dosage of Effexor. Please check with [email or web address] before sending a refill

## 2017-01-20 ENCOUNTER — Telehealth: Payer: Self-pay | Admitting: Psychiatry

## 2017-01-23 ENCOUNTER — Encounter: Payer: Self-pay | Admitting: Internal Medicine

## 2017-01-23 ENCOUNTER — Ambulatory Visit (INDEPENDENT_AMBULATORY_CARE_PROVIDER_SITE_OTHER): Payer: Medicare Other | Admitting: Internal Medicine

## 2017-01-23 ENCOUNTER — Other Ambulatory Visit: Payer: Self-pay

## 2017-01-23 DIAGNOSIS — K219 Gastro-esophageal reflux disease without esophagitis: Secondary | ICD-10-CM

## 2017-01-23 DIAGNOSIS — M545 Low back pain: Secondary | ICD-10-CM

## 2017-01-23 DIAGNOSIS — R232 Flushing: Secondary | ICD-10-CM | POA: Diagnosis not present

## 2017-01-23 DIAGNOSIS — F329 Major depressive disorder, single episode, unspecified: Secondary | ICD-10-CM

## 2017-01-23 DIAGNOSIS — G4733 Obstructive sleep apnea (adult) (pediatric): Secondary | ICD-10-CM

## 2017-01-23 DIAGNOSIS — I10 Essential (primary) hypertension: Secondary | ICD-10-CM

## 2017-01-23 DIAGNOSIS — J4520 Mild intermittent asthma, uncomplicated: Secondary | ICD-10-CM | POA: Diagnosis not present

## 2017-01-23 DIAGNOSIS — F419 Anxiety disorder, unspecified: Secondary | ICD-10-CM | POA: Diagnosis not present

## 2017-01-23 DIAGNOSIS — E7800 Pure hypercholesterolemia, unspecified: Secondary | ICD-10-CM | POA: Diagnosis not present

## 2017-01-23 DIAGNOSIS — R0602 Shortness of breath: Secondary | ICD-10-CM | POA: Diagnosis not present

## 2017-01-23 DIAGNOSIS — M3390 Dermatopolymyositis, unspecified, organ involvement unspecified: Secondary | ICD-10-CM | POA: Diagnosis not present

## 2017-01-23 DIAGNOSIS — F32A Depression, unspecified: Secondary | ICD-10-CM

## 2017-01-23 MED ORDER — VENLAFAXINE HCL ER 150 MG PO CP24: 300.0000 mg | ORAL_CAPSULE | Freq: Every day | ORAL | 0 refills | Status: DC

## 2017-01-23 MED ORDER — VENLAFAXINE HCL 75 MG PO TABS: 75.0000 mg | ORAL_TABLET | Freq: Once | ORAL | 0 refills | Status: DC

## 2017-01-23 MED ORDER — PANTOPRAZOLE SODIUM 40 MG PO TBEC: 40.0000 mg | DELAYED_RELEASE_TABLET | Freq: Every day | ORAL | 2 refills | Status: DC

## 2017-01-23 NOTE — Telephone Encounter (Signed)
Pharmacy was called to make sure that orders for effexor was not done twice because verbal order was done earlier and then note put in.they noted on there end.

## 2017-01-23 NOTE — Progress Notes (Signed)
Patient ID: [name], female   DOB: 1961/05/05, 56 y.o.   MRN: [id number]   Subjective:    Patient ID: [name], female    DOB: Apr 13, 1961, 56 y.o.   MRN: [id number]  HPI  Patient here for a scheduled follow up.  She is accompanied by her husband.  History obtained from both of them.  She recently saw cardiology.  Had functional study.  Negative for ischemia.  Had ECHO - EF 67%.  No increased PAP.  Has known sleep apnea.  Not treated.  Did not tolerate the mask and is not willing to try.  Saw Dr Alva Garnet.  Undergoing w/up.  Still with sob with exertion.  She is not active.  Very sedentary.  We discussed importance of keeping moving and staying active.  She reports some acid reflux.  Food hangs sometimes when eating.  Off PPI.  Will restart.  Could be aggravating above.  Persistent back pain.  Followed by pain clinic. Discussed diet and exercise.  Discussed weight loss.      Past Medical History:  Diagnosis Date  . Allergic rhinitis   . Anxiety   . Arthralgia   . Asthma   . Bronchitis, chronic ([place])   . Chronic back pain   . Collagenous colitis    followed by Dr Tiffany Kocher  . Depression   . Diplopia 11/15/2013  . Dysphagia   . Endometriosis   . Fibrocystic breast disease   . GERD (gastroesophageal reflux disease)   . History of colon polyps   . Hyperlipidemia   . Hypertension   . Hypokalemia   . IBS (irritable bowel syndrome)   . Memory difficulty 11/15/2013  . Migraine headache   . Pneumonia 2017   x2  . PONV (postoperative nausea and vomiting)    used patch  . Sleep apnea    recommended CPAP  . Ulcer disease   . Urine incontinence    Past Surgical History:  Procedure Laterality Date  . ABDOMINAL HYSTERECTOMY     endometriosis  . BACK SURGERY     x6  . BREAST BIOPSY Right   . COLONOSCOPY WITH PROPOFOL N/A 06/25/2015   Procedure: COLONOSCOPY WITH PROPOFOL;  Surgeon: Manya Silvas, MD;  Location: [facility] ENDOSCOPY;  Service: Endoscopy;  Laterality: N/A;  . DILATION  AND CURETTAGE OF UTERUS     and hysteroscopy  . ESOPHAGOGASTRODUODENOSCOPY N/A 06/25/2015   Procedure: ESOPHAGOGASTRODUODENOSCOPY (EGD);  Surgeon: Manya Silvas, MD;  Location[facility] ENDOSCOPY;  Service: Endoscopy;  Laterality: N/A;  . insertion of permanent spinal cord stimulator    . insertion of trial spinal cord stimulator    . LUMBAR DISC SURGERY    . LUMBAR FUSION  4/05 and 8/07  . LUMBAR MICRODISCECTOMY  03/28/03   L4-5 L5  . MOUTH SURGERY    . NASAL SEPTOPLASTY W/ TURBINOPLASTY    . removal of spinal cord stimulator    . SEPTOPLASTY     with reduction of turbinates  . THYROIDECTOMY N/A 06/06/2016   Procedure: THYROIDECTOMY;  Surgeon: Robert Bellow, MD;  Location: [facility] ORS;  Service: General;  Laterality: N/A;   Family History  Problem Relation Age of Onset  . Allergies Mother   . Hypertension Mother   . Arthritis Mother   . Hyperlipidemia Mother   . Stroke Mother   . Hypertension Father   . Arthritis Father   . Diabetes Father   . Hypertension Brother   . Diabetes Brother   . Arthritis  Maternal Grandmother   . Hyperlipidemia Maternal Grandmother   . Hyperlipidemia Maternal Grandfather   . Arthritis Paternal Grandmother   . Arthritis Paternal Grandfather   . Asthma Unknown        several family member  . Leukemia Unknown        uncle  . Lymphoma Maternal Aunt        non hodgkins   Social History   Social History  . Marital status: Married    Spouse name: N/A  . Number of children: 1  . Years of education: 22 th   Occupational History  . Disabled   .  Disabled   Social History Main Topics  . Smoking status: Never Smoker  . Smokeless tobacco: Never Used  . Alcohol use No  . Drug use: No  . Sexual activity: Not Currently   Other Topics Concern  . None   Social History Narrative  . None    Outpatient Encounter Prescriptions as of 01/23/2017  Medication Sig  . albuterol (PROVENTIL HFA;VENTOLIN HFA) 108 (90 Base) MCG/ACT inhaler Inhale 2  puffs into the lungs every 4 (four) hours as needed for wheezing or shortness of breath.  Marland Kitchen albuterol (PROVENTIL) (2.5 MG/3ML) 0.083% nebulizer solution Take 2.5 mg by nebulization every 4 (four) hours as needed for wheezing or shortness of breath.  . calcium-vitamin D (OSCAL WITH D) 500-200 MG-UNIT tablet Take 2 tablets by mouth 2 (two) times daily.  . diazepam (VALIUM) 5 MG tablet Take 5 mg by mouth 3 (three) times daily as needed.    . docusate sodium (COLACE) 100 MG capsule Take 100 mg by mouth 2 (two) times daily.  Marland Kitchen gabapentin (NEURONTIN) 800 MG tablet Take 800 mg by mouth 4 (four) times daily.  . hydrochlorothiazide (HYDRODIURIL) 25 MG tablet Take 25 mg by mouth daily.  Marland Kitchen KLOR-CON 10 10 MEQ tablet TAKE 1 TABLET (10 MEQ TOTAL) BY MOUTH DAILY.  Marland Kitchen levothyroxine (SYNTHROID) 125 MCG tablet Take 1 tablet (125 mcg total) by mouth daily before breakfast.  . metoprolol (LOPRESSOR) 50 MG tablet TAKE 1/2 TABLET BY MOUTH TWICE A DAY (NEED OFFICE VISIT)  . Multiple Vitamin (MULTIVITAMIN) capsule Take 1 capsule by mouth daily.    Marland Kitchen nystatin (NYSTATIN) powder Apply topically 2 (two) times daily.  Marland Kitchen nystatin cream (MYCOSTATIN) Apply 1 application topically 2 (two) times daily.  . Omega-3 Fatty Acids (FISH OIL) 1200 MG CAPS Take 1 capsule by mouth daily.  . Oxycodone HCl 10 MG TABS Take 10 mg by mouth 3 (three) times daily as needed.  . pravastatin (PRAVACHOL) 10 MG tablet TAKE 1 TABLET (10 MG TOTAL) BY MOUTH DAILY.  Marland Kitchen predniSONE (DELTASONE) 5 MG tablet Take 1 tablet (5 mg total) by mouth daily with breakfast.  . RELISTOR 150 MG TABS TAKE 3 TABLETS BY MOUTH ONCE A DAY prn  . SYMBICORT 80-4.5 MCG/ACT inhaler INHALE 2 PUFFS BY MOUTH TWICE A DAY IN THE MORNING AND EVENING  . triamcinolone cream (KENALOG) 0.1 % Apply 1 application topically 2 (two) times daily. Do not use more than 7-10 days in one area  . UNABLE TO FIND Med Name: I Cool 1 daily for menopause  . venlafaxine (EFFEXOR) 75 MG tablet Take 1  tablet (75 mg total) by mouth once.  . venlafaxine XR (EFFEXOR-XR) 150 MG 24 hr capsule Take 2 capsules (300 mg total) by mouth daily with breakfast.  . XTAMPZA ER 36 MG C12A TAKE 1 CAPSULE BY MOUTH EVERY 12 HOURS  . zolpidem (  AMBIEN) 10 MG tablet Take 10 mg by mouth at bedtime as needed.    . pantoprazole (PROTONIX) 40 MG tablet Take 1 tablet (40 mg total) by mouth daily.  . [DISCONTINUED] venlafaxine (EFFEXOR) 75 MG tablet TAKE 1 TABLET (75 MG TOTAL) BY MOUTH ONCE.  . [DISCONTINUED] venlafaxine XR (EFFEXOR-XR) 150 MG 24 hr capsule TAKE 2 CAPSULES (300 MG TOTAL) BY MOUTH DAILY WITH BREAKFAST.   No facility-administered encounter medications on file as of 01/23/2017.     Review of Systems  Constitutional: Positive for fatigue. Negative for activity change.  HENT: Negative for congestion and sinus pressure.   Respiratory: Negative for cough and chest tightness.        SOB with exertion.    Cardiovascular: Negative for chest pain and palpitations.  Gastrointestinal: Negative for abdominal pain, diarrhea, nausea and vomiting.  Endocrine:       Reports hot flashes.    Genitourinary: Negative for difficulty urinating and dysuria.  Musculoskeletal: Positive for back pain. Negative for joint swelling.  Skin: Negative for color change and rash.  Neurological: Negative for dizziness, light-headedness and headaches.  Psychiatric/Behavioral: Negative for agitation and dysphoric mood.       Objective:     Blood pressure rechecked by me:  132/78  Physical Exam  Constitutional: She appears well-developed and well-nourished. No distress.  HENT:  Nose: Nose normal.  Mouth/Throat: Oropharynx is clear and moist.  Neck: Neck supple. No thyromegaly present.  Cardiovascular: Normal rate and regular rhythm.   Pulmonary/Chest: Breath sounds normal. No respiratory distress.  Some cough with forced expiration.    Abdominal: Soft. Bowel sounds are normal. There is no tenderness.  Musculoskeletal: She  exhibits no tenderness.  No increased edema.    Lymphadenopathy:    She has no cervical adenopathy.  Skin: No rash noted. No erythema.  Psychiatric: She has a normal mood and affect. Her behavior is normal.    BP (!) 143/86 (BP Location: Left Arm, Patient Position: Sitting, Cuff Size: Normal)   Pulse 82   Temp 98.4 F (36.9 C) (Oral)   Resp 16   Ht 5\' 4"  (1.626 m)   Wt 231 lb 9.6 oz (105.1 kg)   SpO2 96%   BMI 39.75 kg/m  Wt Readings from Last 3 Encounters:  01/23/17 231 lb 9.6 oz (105.1 kg)  12/26/16 224 lb (101.6 kg)  11/25/16 224 lb 9.6 oz (101.9 kg)     Lab Results  Component Value Date   WBC 9.8 12/26/2016   HGB 14.6 12/26/2016   HCT 43.6 12/26/2016   PLT 299 12/26/2016   GLUCOSE 92 12/08/2016   CHOL 227 (H) 12/08/2016   TRIG 226.0 (H) 12/08/2016   HDL 62.20 12/08/2016   LDLDIRECT 127.0 12/08/2016   LDLCALC 121 (H) 07/12/2016   ALT 20 12/08/2016   AST 21 12/08/2016   NA 145 12/08/2016   K 3.6 12/08/2016   CL 104 12/08/2016   CREATININE 0.75 12/08/2016   BUN 9 12/08/2016   CO2 34 (H) 12/08/2016   TSH 3.38 12/08/2016       Assessment & Plan:   Problem List Items Addressed This Visit    Anxiety    Followed by psychiatry.        Asthma    Seeing Dr Alva Garnet.  Undergoing further w/up.        Back pain    Chronic back pain.  Followed by pain clinic.        Depression    Has been  followed by psychiatry.        DERMATOMYOSITIS    Has been followed by Dr Jefm Bryant.  Undergoing pulmonary w/up.        Essential hypertension, benign    Blood pressure on recheck improved.  Follow pressures.  Follow metabolic panel.        Relevant Orders   TSH   Basic metabolic panel   GERD    With increased acid reflux.  Some trouble swallowing as outlined.  Restart protonix.  Wanted to hold on GI evaluation.  Follow closely.  If persistent issues, may need f/u EGD.        Relevant Medications   pantoprazole (PROTONIX) 40 MG tablet   Hot flashes    Felt to  be multifactorial.  Not active.  Occurs when up and moving.  Just had cardiology w/up.  Recent tsh wnl.  Discussed further w/up.  She declines.  Follow.  Off estrogen for years.        Hypercholesterolemia    On pravastatin.  Low cholesterol diet and exercise.  Follow lipid panel and liver function tests.        Relevant Orders   Hepatic function panel   Lipid panel   Obstructive sleep apnea    Declines CPAP.  Discussed with her today.        SOB (shortness of breath)    Just had cardiac w/up as outlined.  Unrevealing.  Seeing pulmonary.  Undergoing w/up.  Continues on low dose prednisone and inhalers.  Discussed importance of moving/exercise.  Follow.            Einar Pheasant, MD

## 2017-01-23 NOTE — Progress Notes (Unsigned)
Both effexor xr 150 #60 with no additional refills was called into pharmacy along with the effexor 75mg  #30 with no additional refills per dr. Dwyane Dee ok.

## 2017-01-23 NOTE — Telephone Encounter (Signed)
Called in refill for effexor xr 150mg  #60 with no additional refills and effexor 75mg  #30 with no additional refills.  Ok by dr. Dwyane Dee.

## 2017-01-23 NOTE — Telephone Encounter (Signed)
Yes, refill it for 1 month

## 2017-01-23 NOTE — Telephone Encounter (Signed)
Pt was notified that rxs' were called into pharmacy.

## 2017-01-23 NOTE — Progress Notes (Signed)
Pre-visit discussion using our clinic review tool. No additional management support is needed unless otherwise documented below in the visit note.  

## 2017-01-23 NOTE — Telephone Encounter (Signed)
pt called states she needs refill on effexor pt was suppose to have seen dr. Gretel Acre on  01-18-17 but dr. Gretel Acre was out of office pt has appt for  02-01-17.  pt last seen on  09-21-16. pt takes effexor 75mg  and effexor xr 150

## 2017-01-25 ENCOUNTER — Encounter: Payer: Self-pay | Admitting: Internal Medicine

## 2017-01-25 DIAGNOSIS — M47817 Spondylosis without myelopathy or radiculopathy, lumbosacral region: Secondary | ICD-10-CM | POA: Diagnosis not present

## 2017-01-25 DIAGNOSIS — G4700 Insomnia, unspecified: Secondary | ICD-10-CM | POA: Diagnosis not present

## 2017-01-25 DIAGNOSIS — G894 Chronic pain syndrome: Secondary | ICD-10-CM | POA: Diagnosis not present

## 2017-01-25 DIAGNOSIS — M62830 Muscle spasm of back: Secondary | ICD-10-CM | POA: Diagnosis not present

## 2017-01-25 DIAGNOSIS — R232 Flushing: Secondary | ICD-10-CM | POA: Insufficient documentation

## 2017-01-25 NOTE — Assessment & Plan Note (Signed)
Blood pressure on recheck improved.  Follow pressures.  Follow metabolic panel.  

## 2017-01-25 NOTE — Assessment & Plan Note (Signed)
Followed by psychiatry 

## 2017-01-25 NOTE — Assessment & Plan Note (Signed)
Chronic back pain.  Followed by pain clinic.  

## 2017-01-25 NOTE — Assessment & Plan Note (Signed)
With increased acid reflux.  Some trouble swallowing as outlined.  Restart protonix.  Wanted to hold on GI evaluation.  Follow closely.  If persistent issues, may need f/u EGD.

## 2017-01-25 NOTE — Assessment & Plan Note (Addendum)
Felt to be multifactorial.  Not active.  Occurs when up and moving.  Just had cardiology w/up.  Recent tsh wnl.  Discussed further w/up.  She declines.  Follow.  Off estrogen for years.

## 2017-01-25 NOTE — Assessment & Plan Note (Signed)
Has been followed by Dr Jefm Bryant.  Undergoing pulmonary w/up.

## 2017-01-25 NOTE — Assessment & Plan Note (Signed)
On pravastatin.  Low cholesterol diet and exercise.  Follow lipid panel and liver function tests.   

## 2017-01-25 NOTE — Assessment & Plan Note (Signed)
Declines CPAP.  Discussed with her today.

## 2017-01-25 NOTE — Assessment & Plan Note (Signed)
Just had cardiac w/up as outlined.  Unrevealing.  Seeing pulmonary.  Undergoing w/up.  Continues on low dose prednisone and inhalers.  Discussed importance of moving/exercise.  Follow.

## 2017-01-25 NOTE — Assessment & Plan Note (Signed)
Has been followed by psychiatry.   

## 2017-01-25 NOTE — Assessment & Plan Note (Signed)
Seeing Dr Alva Garnet.  Undergoing further w/up.

## 2017-01-27 ENCOUNTER — Telehealth: Payer: Self-pay | Admitting: Pulmonary Disease

## 2017-01-27 NOTE — Telephone Encounter (Signed)
Appointment rescheduled 03/06/17 DS.

## 2017-01-27 NOTE — Telephone Encounter (Signed)
Patient wants sooner needs fu from 5/14 appt scheduled now in august added to waitlist

## 2017-02-01 ENCOUNTER — Ambulatory Visit: Payer: Medicare Other | Admitting: Psychiatry

## 2017-02-02 NOTE — Telephone Encounter (Signed)
pt last seen in feb next appt july 30th can pt receive enough medication to due until her appt.

## 2017-02-07 ENCOUNTER — Ambulatory Visit: Payer: Medicare Other

## 2017-02-07 ENCOUNTER — Other Ambulatory Visit: Payer: Medicare Other

## 2017-02-08 ENCOUNTER — Other Ambulatory Visit: Payer: Self-pay | Admitting: Internal Medicine

## 2017-02-18 ENCOUNTER — Other Ambulatory Visit (HOSPITAL_COMMUNITY): Payer: Self-pay | Admitting: Psychiatry

## 2017-02-21 ENCOUNTER — Other Ambulatory Visit (HOSPITAL_COMMUNITY): Payer: Self-pay | Admitting: Psychiatry

## 2017-02-22 ENCOUNTER — Telehealth (HOSPITAL_COMMUNITY): Payer: Self-pay

## 2017-02-22 NOTE — Telephone Encounter (Signed)
Regan from the [place] office called states she forgot to put in what medication it was requested. It was for the venlafaxine 75mg . Pt was last seen on  09-21-16 next appt  03-13-17

## 2017-02-22 NOTE — Telephone Encounter (Signed)
Dr. Dwyane Dee received a refill request for this patient, she normally sees Dr. Gretel Acre. Patient was last seen in February and has a follow up at the end of this month. Please review and advise, thank you

## 2017-02-23 NOTE — Telephone Encounter (Signed)
pt husband called.  he was told that since pt had not been seen since feb that we would have to wait until dr. Gretel Acre returned to the office on monday to see if she would refill or give enough medication to do until her appt.

## 2017-02-27 NOTE — Telephone Encounter (Signed)
Pt need appt

## 2017-02-28 ENCOUNTER — Telehealth: Payer: Self-pay | Admitting: Internal Medicine

## 2017-02-28 NOTE — Telephone Encounter (Signed)
A message was left on voice mail that per dr. Alonza Smoker order pt would need to be seen and that appt needed. Pt was left a message with the phone number to the office so that she can set up or move appt

## 2017-02-28 NOTE — Telephone Encounter (Signed)
Please advise have called patient and let her know that you are out of the office today. She will be out tomorrow. She had to cancel last two appointments. She called their office and was told that she needed to find another provider that Dr. Jacinta Shoe she does not have follow up with anyone at this time. She also would like to be set up with another provider.

## 2017-02-28 NOTE — Telephone Encounter (Signed)
Pt called and stated that she needs a refill on her venlafaxine XR (EFFEXOR-XR) 150 MG 24 hr capsule and venlafaxine (EFFEXOR) 75 MG tablet(Expired). Pt has not see Dr. Jacinta Shoe until February because of flare ups and Dr. Jacinta Shoe will not refill until she sees her. Pt has not taken any medication today. Pt was wondering if Dr. Nicki Reaper would refill this until her next appt with her on April 15, 2017. Pt asked that we do call her back today with some sort of update. Please advise, thank you!  Call pt @ [phone]  Pharmacy - [id number] [address], [place]

## 2017-02-28 NOTE — Telephone Encounter (Signed)
I do not understand the message from the pt.  Please call Dr Anola Gurney office and clarify.  (with psych at [facility]).  There should be some way this pt can f/u with her psychiatrist that has been prescribing these medications.  Will need f/u with psych.

## 2017-03-01 ENCOUNTER — Telehealth: Payer: Self-pay

## 2017-03-01 ENCOUNTER — Telehealth: Payer: Self-pay | Admitting: *Deleted

## 2017-03-01 NOTE — Telephone Encounter (Signed)
Called and spoke with office was told that her last app was on 09/21/16. She has follow up app scheduled for 03/13/17. She was informed that she will need to start looking for another provider and to call her insurance and see who was in network. Their office will not give any scripts until seen for follow up on 7/30. She is out of meds today. Patient would like refill until follow up.

## 2017-03-01 NOTE — Telephone Encounter (Signed)
Pt  Physiatrist  is leaving and can no longer see pt. Pt has been out of her medication ( Effexor) , pt is requesting to have a refill until she finds another phyciatrist  Contact Husband Nathaneil Canary [phone]

## 2017-03-01 NOTE — Telephone Encounter (Signed)
pt needs refill on effexor pt last seen in feb. pt does have appt for 03-13-17 pt is requesting enough medication to due until her appt on  03-13-17.

## 2017-03-01 NOTE — Telephone Encounter (Signed)
LMOM for patient to call back about her medication.

## 2017-03-01 NOTE — Telephone Encounter (Signed)
Please call pt [phone]

## 2017-03-01 NOTE — Telephone Encounter (Signed)
Reviewed psych telephone note and pts request.  She has been following with psychiatry for her medications.  She does need to get the effexor rx from them.  I have also called them and left them a message - to see if she can get enough until her appt with them.

## 2017-03-01 NOTE — Telephone Encounter (Signed)
Has another message started already that Dr. Nicki Reaper has

## 2017-03-02 NOTE — Telephone Encounter (Signed)
Called husband and let him know that per Dr. Nicki Reaper patient is followed by psychiatry for this medications and she would ike them to follow recommendations from provider and go to ED.

## 2017-03-02 NOTE — Telephone Encounter (Signed)
Noted.  Per psychiatry - will need to go to ER.

## 2017-03-02 NOTE — Telephone Encounter (Signed)
Husband called back he was told by her office to take her to ED. No doctor in office at all today.  I called office and spoke to Jess states that patient has no showed or missed 4 scheduled app from last time seen in the office. She will not have provider in office until Monday and advised that she go to ED to get script. The last script per chart was given by Dr. Dwyane Dee their office is not sure who that was. I have looked online and it looks it may by a doctor that was on call after hours. She has been out of meds for 2 days.  Per office she is on Efexor 150 xr bid las script was on 6/11 for #60.

## 2017-03-02 NOTE — Telephone Encounter (Signed)
Husband called back informed that we have sent message to physiatrist office she should call their office for refills. He will call if any problems.

## 2017-03-02 NOTE — Telephone Encounter (Signed)
Dr. Nicki Reaper would like for you to contact her in regards of patient.

## 2017-03-06 ENCOUNTER — Ambulatory Visit: Payer: Medicare Other | Admitting: Pulmonary Disease

## 2017-03-13 ENCOUNTER — Ambulatory Visit (INDEPENDENT_AMBULATORY_CARE_PROVIDER_SITE_OTHER): Payer: Medicare Other | Admitting: Psychiatry

## 2017-03-13 ENCOUNTER — Encounter: Payer: Self-pay | Admitting: Psychiatry

## 2017-03-13 VITALS — BP 132/71 | HR 80 | Temp 98.3°F

## 2017-03-13 DIAGNOSIS — F331 Major depressive disorder, recurrent, moderate: Secondary | ICD-10-CM

## 2017-03-13 DIAGNOSIS — F411 Generalized anxiety disorder: Secondary | ICD-10-CM

## 2017-03-13 MED ORDER — VENLAFAXINE HCL ER 150 MG PO CP24: 300.0000 mg | ORAL_CAPSULE | Freq: Every day | ORAL | 3 refills | Status: DC

## 2017-03-13 MED ORDER — VENLAFAXINE HCL 75 MG PO TABS: 75.0000 mg | ORAL_TABLET | Freq: Once | ORAL | 3 refills | Status: DC

## 2017-03-13 NOTE — Progress Notes (Signed)
BH MD/PA/NP OP Progress Note  03/13/2017 12:32 PM [name]  MRN:  [id number]  Subjective:   Patient is a 56 year old female who presented for follow up accompanied by her husband. She was sitting in the wheelchair. She reported that she ran out of her Effexor one week ago as she was not seen since every. She reported that she was pretty upset and has been calling for the refill of her medications. She reported that her husband has some leftover medications. She reported that she has been diagnosed with Dermatomyositis and was taking Effexor XR 375 mg which was restarted by the neurologist and was continued by Dr. Mamie Nick. However we do not have any records of her medication. Patient reported that she is stable on her current dose of the medication and does not want to change her medications at this time. She reported that she is also going to [facility] who has been prescribing her zolpidem oxycodone Valium and other medication on a monthly basis. She reported that they are going to start her on prednisone to complete her treatment.  She remains insistent to continue on the same dose of the Effexor. She currently denied having any suicidal homicidal ideations or plans. Her husband remains supportive.      Chief Complaint:  Chief Complaint    Follow-up; Medication Refill     Visit Diagnosis:     ICD-10-CM   1. MDD (major depressive disorder), recurrent episode, moderate (HCC) F33.1   2. Anxiety state F41.1     Past Medical History:  Past Medical History:  Diagnosis Date  . Allergic rhinitis   . Anxiety   . Arthralgia   . Asthma   . Bronchitis, chronic ([place])   . Chronic back pain   . Collagenous colitis    followed by Dr Tiffany Kocher  . Depression   . Diplopia 11/15/2013  . Dysphagia   . Endometriosis   . Fibrocystic breast disease   . GERD (gastroesophageal reflux disease)   . History of colon polyps   . Hyperlipidemia   . Hypertension   . Hypokalemia   . IBS (irritable bowel  syndrome)   . Memory difficulty 11/15/2013  . Migraine headache   . Pneumonia 2017   x2  . PONV (postoperative nausea and vomiting)    used patch  . Sleep apnea    recommended CPAP  . Ulcer disease   . Urine incontinence     Past Surgical History:  Procedure Laterality Date  . ABDOMINAL HYSTERECTOMY     endometriosis  . BACK SURGERY     x6  . BREAST BIOPSY Right   . COLONOSCOPY WITH PROPOFOL N/A 06/25/2015   Procedure: COLONOSCOPY WITH PROPOFOL;  Surgeon: Manya Silvas, MD;  Location: [facility] ENDOSCOPY;  Service: Endoscopy;  Laterality: N/A;  . DILATION AND CURETTAGE OF UTERUS     and hysteroscopy  . ESOPHAGOGASTRODUODENOSCOPY N/A 06/25/2015   Procedure: ESOPHAGOGASTRODUODENOSCOPY (EGD);  Surgeon: Manya Silvas, MD;  Location: [facility] ENDOSCOPY;  Service: Endoscopy;  Laterality: N/A;  . insertion of permanent spinal cord stimulator    . insertion of trial spinal cord stimulator    . LUMBAR DISC SURGERY    . LUMBAR FUSION  4/05 and 8/07  . LUMBAR MICRODISCECTOMY  03/28/03   L4-5 L5  . MOUTH SURGERY    . NASAL SEPTOPLASTY W/ TURBINOPLASTY    . removal of spinal cord stimulator    . SEPTOPLASTY     with reduction of turbinates  .  THYROIDECTOMY N/A 06/06/2016   Procedure: THYROIDECTOMY;  Surgeon: Robert Bellow, MD;  Location: [facility] ORS;  Service: General;  Laterality: N/A;   Family History:  Family History  Problem Relation Age of Onset  . Allergies Mother   . Hypertension Mother   . Arthritis Mother   . Hyperlipidemia Mother   . Stroke Mother   . Hypertension Father   . Arthritis Father   . Diabetes Father   . Hypertension Brother   . Diabetes Brother   . Arthritis Maternal Grandmother   . Hyperlipidemia Maternal Grandmother   . Hyperlipidemia Maternal Grandfather   . Arthritis Paternal Grandmother   . Arthritis Paternal Grandfather   . Asthma Unknown        several family member  . Leukemia Unknown        uncle  . Lymphoma Maternal Aunt        non hodgkins    Social History:  Social History   Social History  . Marital status: Married    Spouse name: N/A  . Number of children: 1  . Years of education: 52 th   Occupational History  . Disabled   .  Disabled   Social History Main Topics  . Smoking status: Never Smoker  . Smokeless tobacco: Never Used  . Alcohol use No  . Drug use: No  . Sexual activity: Not Currently   Other Topics Concern  . None   Social History Narrative  . None   Additional History:  Married x 37 years. Has a daughter and has 3 grandsons.  Was following Dr Mamie Nick for almost 3 years.    Assessment:   Musculoskeletal: Strength & Muscle Tone: within normal limits Gait & Station: unsteady Patient leans: N/A  Psychiatric Specialty Exam: Medication Refill     ROS  Blood pressure 132/71, pulse 80, temperature 98.3 F (36.8 C), temperature source Oral.There is no height or weight on file to calculate BMI.  General Appearance: Casual  Eye Contact:  Fair  Speech:  Clear and Coherent  Volume:  Decreased  Mood:  Anxious  Affect:  Congruent and Constricted  Thought Process:  Coherent  Orientation:  Full (Time, Place, and Person)  Thought Content:  WDL  Suicidal Thoughts:  No  Homicidal Thoughts:  No  Memory:  Immediate;   Fair  Judgement:  Fair  Insight:  Fair  Psychomotor Activity:  Decreased  Concentration:  Fair  Recall:  [employer] of Knowledge: Fair  Language: Fair  Akathisia:  No  Handed:  Right  AIMS (if indicated):    Assets:  Communication Skills Desire for Improvement Physical Health Social Support  ADL's:  Intact  Cognition: WNL  Sleep:  6-7   Is the patient at risk to self?  No. Has the patient been a risk to self in the past 6 months?  No. Has the patient been a risk to self within the distant past?  No. Is the patient a risk to others?  No. Has the patient been a risk to others in the past 6 months?  No. Has the patient been a risk to others within the distant past?   No.  Current Medications: Current Outpatient Prescriptions  Medication Sig Dispense Refill  . albuterol (PROVENTIL HFA;VENTOLIN HFA) 108 (90 Base) MCG/ACT inhaler Inhale 2 puffs into the lungs every 4 (four) hours as needed for wheezing or shortness of breath. 1 Inhaler 3  . albuterol (PROVENTIL) (2.5 MG/3ML) 0.083% nebulizer solution Take 2.5 mg by  nebulization every 4 (four) hours as needed for wheezing or shortness of breath.    . calcium-vitamin D (OSCAL WITH D) 500-200 MG-UNIT tablet Take 2 tablets by mouth 2 (two) times daily. 120 tablet 6  . diazepam (VALIUM) 5 MG tablet Take 5 mg by mouth 3 (three) times daily as needed.      . docusate sodium (COLACE) 100 MG capsule Take 100 mg by mouth 2 (two) times daily.    Marland Kitchen gabapentin (NEURONTIN) 800 MG tablet Take 800 mg by mouth 4 (four) times daily.  2  . hydrochlorothiazide (HYDRODIURIL) 25 MG tablet Take 25 mg by mouth daily.  11  . KLOR-CON 10 10 MEQ tablet TAKE 1 TABLET (10 MEQ TOTAL) BY MOUTH DAILY. 30 tablet 4  . levothyroxine (SYNTHROID) 125 MCG tablet Take 1 tablet (125 mcg total) by mouth daily before breakfast. 90 tablet 4  . metoprolol tartrate (LOPRESSOR) 50 MG tablet TAKE 1/2 TABLET BY MOUTH TWICE A DAY (NEED OFFICE VISIT) 30 tablet 1  . Multiple Vitamin (MULTIVITAMIN) capsule Take 1 capsule by mouth daily.      Marland Kitchen nystatin (NYSTATIN) powder Apply topically 2 (two) times daily. 15 g 0  . nystatin cream (MYCOSTATIN) Apply 1 application topically 2 (two) times daily. 30 g 0  . Omega-3 Fatty Acids (FISH OIL) 1200 MG CAPS Take 1 capsule by mouth daily.    Dellia Nims CALCIUM + D3 500-200 MG-UNIT TABS TAKE 2 TABLETS BY MOUTH 2 (TWO) TIMES DAILY.  6  . Oxycodone HCl 10 MG TABS Take 10 mg by mouth 3 (three) times daily as needed.  0  . pantoprazole (PROTONIX) 40 MG tablet Take 1 tablet (40 mg total) by mouth daily. 30 tablet 2  . pravastatin (PRAVACHOL) 10 MG tablet TAKE 1 TABLET (10 MG TOTAL) BY MOUTH DAILY. 45 tablet 6  . predniSONE  (DELTASONE) 5 MG tablet Take 1 tablet (5 mg total) by mouth daily with breakfast. 30 tablet 10  . RELISTOR 150 MG TABS TAKE 3 TABLETS BY MOUTH ONCE A DAY prn  2  . SYMBICORT 80-4.5 MCG/ACT inhaler INHALE 2 PUFFS BY MOUTH TWICE A DAY IN THE MORNING AND EVENING 10.2 Inhaler 2  . triamcinolone cream (KENALOG) 0.1 % Apply 1 application topically 2 (two) times daily. Do not use more than 7-10 days in one area 30 g 0  . UNABLE TO FIND Med Name: I Cool 1 daily for menopause    . venlafaxine XR (EFFEXOR-XR) 150 MG 24 hr capsule Take 2 capsules (300 mg total) by mouth daily with breakfast. 60 capsule 0  . XTAMPZA ER 36 MG C12A TAKE 1 CAPSULE BY MOUTH EVERY 12 HOURS  0  . zolpidem (AMBIEN) 10 MG tablet Take 10 mg by mouth at bedtime as needed.      . venlafaxine (EFFEXOR) 75 MG tablet Take 1 tablet (75 mg total) by mouth once. 30 tablet 0   No current facility-administered medications for this visit.      Medical Decision Making:  Review of Psycho-Social Stressors (1)  Treatment Plan Summary:Medication management  I reviewed all the medications with the patient and her husband. She is getting Valium and Ambien from the [facility]. Continue  Effexor 375 mg daily. She will follow-up in 3  months or earlier  No previous records of the patient available in the practice   More than 50% of the time spent in psychoeducation, counseling and coordination of care.    This note was generated in  part or whole with voice recognition software. Voice regonition is usually quite accurate but there are transcription errors that can and very often do occur. I apologize for any typographical errors that were not detected and corrected.     Rainey Pines, MD    03/13/2017, 12:32 PM

## 2017-03-16 ENCOUNTER — Other Ambulatory Visit: Payer: Self-pay | Admitting: Internal Medicine

## 2017-03-22 DIAGNOSIS — Z79891 Long term (current) use of opiate analgesic: Secondary | ICD-10-CM | POA: Diagnosis not present

## 2017-03-22 DIAGNOSIS — K5900 Constipation, unspecified: Secondary | ICD-10-CM | POA: Diagnosis not present

## 2017-03-22 DIAGNOSIS — G894 Chronic pain syndrome: Secondary | ICD-10-CM | POA: Diagnosis not present

## 2017-03-22 DIAGNOSIS — G4700 Insomnia, unspecified: Secondary | ICD-10-CM | POA: Diagnosis not present

## 2017-03-22 DIAGNOSIS — M3390 Dermatopolymyositis, unspecified, organ involvement unspecified: Secondary | ICD-10-CM | POA: Diagnosis not present

## 2017-03-22 DIAGNOSIS — M62830 Muscle spasm of back: Secondary | ICD-10-CM | POA: Diagnosis not present

## 2017-03-22 DIAGNOSIS — M47817 Spondylosis without myelopathy or radiculopathy, lumbosacral region: Secondary | ICD-10-CM | POA: Diagnosis not present

## 2017-03-22 DIAGNOSIS — M5412 Radiculopathy, cervical region: Secondary | ICD-10-CM | POA: Diagnosis not present

## 2017-03-22 DIAGNOSIS — M961 Postlaminectomy syndrome, not elsewhere classified: Secondary | ICD-10-CM | POA: Diagnosis not present

## 2017-03-31 ENCOUNTER — Ambulatory Visit: Payer: Medicare Other | Admitting: Pulmonary Disease

## 2017-04-04 ENCOUNTER — Other Ambulatory Visit: Payer: Self-pay | Admitting: Internal Medicine

## 2017-04-04 ENCOUNTER — Other Ambulatory Visit: Payer: Medicare Other

## 2017-04-10 ENCOUNTER — Ambulatory Visit (INDEPENDENT_AMBULATORY_CARE_PROVIDER_SITE_OTHER): Payer: Medicare Other | Admitting: Pulmonary Disease

## 2017-04-10 ENCOUNTER — Encounter: Payer: Self-pay | Admitting: Pulmonary Disease

## 2017-04-10 VITALS — BP 130/80 | HR 78 | Resp 16 | Ht 64.0 in | Wt 234.0 lb

## 2017-04-10 DIAGNOSIS — J4540 Moderate persistent asthma, uncomplicated: Secondary | ICD-10-CM

## 2017-04-10 DIAGNOSIS — D721 Eosinophilia, unspecified: Secondary | ICD-10-CM

## 2017-04-10 MED ORDER — BUDESONIDE-FORMOTEROL FUMARATE 160-4.5 MCG/ACT IN AERO: 2.0000 | INHALATION_SPRAY | Freq: Two times a day (BID) | RESPIRATORY_TRACT | 12 refills | Status: DC

## 2017-04-10 MED ORDER — BUDESONIDE-FORMOTEROL FUMARATE 160-4.5 MCG/ACT IN AERO: 2.0000 | INHALATION_SPRAY | Freq: Two times a day (BID) | RESPIRATORY_TRACT | 0 refills | Status: DC

## 2017-04-10 NOTE — Patient Instructions (Signed)
Change Symbicort to the higher strength (160/4.5) - 2 inhalations twice daily  Continue prednisone at 5 mg per day for now  Continue albuterol as needed  If you feel like the change in Symbicort strength has improved your asthma control, call here and I will direct you how to taper off of the prednisone  Follow-up in 3 months or sooner as needed

## 2017-04-11 NOTE — Progress Notes (Signed)
PROBLEMS: Dermatomyositis Asthma Evanescent pulmonary infiltrates  DATA: 03/17/11 CT chest: Atelectasis versus infiltrate within the superior segments of the right upper and right lower lobes 11/16/15: Patchy infiltrate left upper lobe consistent with pneumonia 01/14/16 CXR: Patchy alveolar opacities in the left upper lobe 04/07/16 PFTs: mod-severe obstruction with substantial improvement after bronchodilator (FEV1 1.37 > 1.83 liters, 46-62% pred), mild restriction, normal DLCO 04/07/16 6MWT: 192 meters. More limited by LE weakness 04/20/16 HRCT chest: Extensive air trapping, indicating small airways disease. Mild diffuse bronchial wall thickening. Mild patchy regions of ground-glass attenuation in the mid to upper lungs bilaterally. 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 04/25/16 Office spiro: FEV1 2.15 liters (73%) 07/12/16 Respiratory symptoms deteriorated gradually since last course of prednisone. Progressive SOB, increased wheezing and rescue inhaler use X 2-3 weeks.   INTERVAL HISTORY: No major pulmonary events. Negative cardiac workup. Worsening symptoms of dermatomyositis with rash and myalgias  SUBJ: This is a routine reevaluation. Her biggest problem right now is severe back pain - a longstanding problem for which she has undergone multiple surgeries. She is followed at a chronic pain clinic. Her respiratory status is "the same." She reports wheezing "all the time". She is more limited by pain than SOB. She remains on Symbicort inhaler and prednisone 5 mg per day. She uses albuterol 0-4 times per day. She is compliant with these medications. She denies fever, purulent sputum, hemoptysis, LE edema and calf tenderness.   OBJ: Vitals:   04/10/17 1505 04/10/17 1513  BP:  130/80  Pulse:  78  Resp: 16   SpO2:  97%  Weight: 234 lb (106.1 kg)   Height: 5\' 4"  (1.626 m)     Gen: NAD at rest HEENT: WNL Neck: NO LAN, no JVD noted Lungs: full BS,  pseudowheeze present, no bronchial wheezes Cardiovascular: Reg, no M noted Abdomen: Centripetal obesity, soft, NT +BS Ext: no C/C/E Neuro: CNs intact, motor/sens grossly intact Skin: Mild diffuse erythema  DATA CXR (11/08/16): No new film   IMPRESSION: 1) Dermatomyositis 2) History of recurrent "pneumonias" 3) Moderate persistent asthma with acute exacerbation/bronchiolitis 4) 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 If no benefit from increased ICS dose, we can consider Luster Landsberg, MD PCCM service Mobile [phone] Pager [phone] 04/11/2017 10:19 PM

## 2017-04-14 ENCOUNTER — Other Ambulatory Visit: Payer: Medicare Other

## 2017-04-15 ENCOUNTER — Other Ambulatory Visit: Payer: Self-pay | Admitting: Internal Medicine

## 2017-04-18 ENCOUNTER — Telehealth: Payer: Self-pay | Admitting: Internal Medicine

## 2017-04-18 ENCOUNTER — Ambulatory Visit: Payer: Medicare Other | Admitting: Internal Medicine

## 2017-04-18 NOTE — Telephone Encounter (Signed)
FYI - Pt spouse called and cancelled appt. Pt has been sick since Friday.

## 2017-04-18 NOTE — Telephone Encounter (Signed)
Dr. Nicki Reaper informed by Gorden Harms

## 2017-04-24 DIAGNOSIS — M62830 Muscle spasm of back: Secondary | ICD-10-CM | POA: Diagnosis not present

## 2017-04-24 DIAGNOSIS — G894 Chronic pain syndrome: Secondary | ICD-10-CM | POA: Diagnosis not present

## 2017-04-24 DIAGNOSIS — G4700 Insomnia, unspecified: Secondary | ICD-10-CM | POA: Diagnosis not present

## 2017-04-24 DIAGNOSIS — M47817 Spondylosis without myelopathy or radiculopathy, lumbosacral region: Secondary | ICD-10-CM | POA: Diagnosis not present

## 2017-05-10 ENCOUNTER — Other Ambulatory Visit: Payer: Self-pay | Admitting: Physical Medicine and Rehabilitation

## 2017-05-10 DIAGNOSIS — M545 Low back pain: Secondary | ICD-10-CM

## 2017-05-15 ENCOUNTER — Ambulatory Visit: Payer: Medicare Other | Admitting: Psychiatry

## 2017-05-18 ENCOUNTER — Telehealth: Payer: Self-pay

## 2017-05-18 NOTE — Telephone Encounter (Signed)
Patient will have husband call tomorrow she is not sure where she gets anything

## 2017-05-18 NOTE — Telephone Encounter (Signed)
Pharmacy fax received recall on patients HCTZ. Placed in blue folder for review.

## 2017-05-18 NOTE — Telephone Encounter (Signed)
The recall was issued because a bottle of hctz tablets contained a different medication.  They believe no other lots affected.  She needs to take her bottle to the pharmacy and have them look at her pills.

## 2017-05-19 ENCOUNTER — Other Ambulatory Visit: Payer: Medicare Other

## 2017-05-22 DIAGNOSIS — G894 Chronic pain syndrome: Secondary | ICD-10-CM | POA: Diagnosis not present

## 2017-05-22 DIAGNOSIS — M47817 Spondylosis without myelopathy or radiculopathy, lumbosacral region: Secondary | ICD-10-CM | POA: Diagnosis not present

## 2017-05-22 DIAGNOSIS — M62830 Muscle spasm of back: Secondary | ICD-10-CM | POA: Diagnosis not present

## 2017-05-22 DIAGNOSIS — G4700 Insomnia, unspecified: Secondary | ICD-10-CM | POA: Diagnosis not present

## 2017-05-22 NOTE — Telephone Encounter (Signed)
Pt's husband (Doug) lvm on referral line returning [name]'s call. He said to please call him regarding anything about his wife [name]. Pt cb [phone]

## 2017-05-24 ENCOUNTER — Ambulatory Visit
Admission: RE | Admit: 2017-05-24 | Discharge: 2017-05-24 | Disposition: A | Discharge status: Home / Self Care | Payer: 59 | Source: Ambulatory Visit | Attending: Physical Medicine and Rehabilitation | Admitting: Physical Medicine and Rehabilitation

## 2017-05-24 DIAGNOSIS — M545 Low back pain: Secondary | ICD-10-CM

## 2017-05-24 NOTE — Telephone Encounter (Signed)
Spoke to husband he will take by [facility] today she has not started on new bottle she got last week.

## 2017-06-01 ENCOUNTER — Other Ambulatory Visit: Payer: Self-pay | Admitting: Internal Medicine

## 2017-06-07 ENCOUNTER — Other Ambulatory Visit: Payer: Self-pay | Admitting: Pulmonary Disease

## 2017-06-16 ENCOUNTER — Other Ambulatory Visit: Payer: Medicare Other

## 2017-06-20 DIAGNOSIS — M47817 Spondylosis without myelopathy or radiculopathy, lumbosacral region: Secondary | ICD-10-CM | POA: Diagnosis not present

## 2017-06-20 DIAGNOSIS — M62830 Muscle spasm of back: Secondary | ICD-10-CM | POA: Diagnosis not present

## 2017-06-20 DIAGNOSIS — G894 Chronic pain syndrome: Secondary | ICD-10-CM | POA: Diagnosis not present

## 2017-06-20 DIAGNOSIS — G4700 Insomnia, unspecified: Secondary | ICD-10-CM | POA: Diagnosis not present

## 2017-06-21 ENCOUNTER — Other Ambulatory Visit: Payer: Medicare Other

## 2017-06-26 ENCOUNTER — Ambulatory Visit: Payer: Medicare Other | Admitting: Psychiatry

## 2017-06-30 ENCOUNTER — Other Ambulatory Visit: Payer: Medicare Other

## 2017-07-02 ENCOUNTER — Other Ambulatory Visit: Payer: Self-pay | Admitting: Psychiatry

## 2017-07-05 ENCOUNTER — Other Ambulatory Visit: Payer: Self-pay | Admitting: Psychiatry

## 2017-07-05 ENCOUNTER — Other Ambulatory Visit: Payer: Self-pay

## 2017-07-05 DIAGNOSIS — F331 Major depressive disorder, recurrent, moderate: Secondary | ICD-10-CM

## 2017-07-05 MED ORDER — METOPROLOL TARTRATE 50 MG PO TABS: ORAL_TABLET | ORAL | 1 refills | Status: DC

## 2017-07-05 MED ORDER — VENLAFAXINE HCL ER 150 MG PO CP24: 300.0000 mg | ORAL_CAPSULE | Freq: Every day | ORAL | 1 refills | Status: DC

## 2017-07-05 MED ORDER — VENLAFAXINE HCL 75 MG PO TABS: 75.0000 mg | ORAL_TABLET | Freq: Every day | ORAL | 1 refills | Status: DC

## 2017-07-05 NOTE — Telephone Encounter (Signed)
Effexor XR 375 mg sent to pharmacy - 30 days with 1 refill.

## 2017-07-05 NOTE — Telephone Encounter (Signed)
SENT REFILLS TO PHARMACY .

## 2017-07-05 NOTE — Telephone Encounter (Signed)
  Dr. Gretel Acre pt.    Disp Refills Start End   venlafaxine (EFFEXOR) 75 MG tablet 30 tablet 3 03/13/2017 03/13/2017   Sig - Route: Take 1 tablet (75 mg total) by mouth once. - Oral   Sent to pharmacy as: venlafaxine (EFFEXOR) 75 MG tablet   E-Prescribing Status: Receipt confirmed by pharmacy (03/13/2017 12:35 PM EDT)     Disp Refills Start End   venlafaxine XR (EFFEXOR-XR) 150 MG 24 hr capsule 60 capsule 3 03/13/2017    Sig - Route: Take 2 capsules (300 mg total) by mouth daily with breakfast. - Oral   Sent to pharmacy as: venlafaxine XR (EFFEXOR-XR) 150 MG 24 hr capsule   E-Prescribing Status: Receipt confirmed by pharmacy (03/13/2017 12:34 PM EDT)

## 2017-07-09 ENCOUNTER — Other Ambulatory Visit: Payer: Self-pay | Admitting: General Surgery

## 2017-07-11 ENCOUNTER — Other Ambulatory Visit: Payer: Medicare Other

## 2017-07-14 ENCOUNTER — Encounter: Payer: Self-pay | Admitting: Pulmonary Disease

## 2017-07-14 ENCOUNTER — Ambulatory Visit (INDEPENDENT_AMBULATORY_CARE_PROVIDER_SITE_OTHER): Payer: Medicare Other | Admitting: Pulmonary Disease

## 2017-07-14 VITALS — BP 150/108 | HR 81 | Resp 16 | Ht 64.0 in | Wt 235.0 lb

## 2017-07-14 DIAGNOSIS — M3390 Dermatopolymyositis, unspecified, organ involvement unspecified: Secondary | ICD-10-CM

## 2017-07-14 DIAGNOSIS — J4540 Moderate persistent asthma, uncomplicated: Secondary | ICD-10-CM

## 2017-07-14 NOTE — Patient Instructions (Signed)
Continue Symbicort twice a day.  Rinse mouth after use. Continue prednisone at 5 mg/day for now Follow-up in 4 months with chest x-ray prior to that visit Upon follow-up, we will consider tapering prednisone to off

## 2017-07-14 NOTE — Progress Notes (Signed)
PROBLEMS: Dermatomyositis Asthma Evanescent pulmonary infiltrates  DATA: 03/17/11 CT chest: Atelectasis versus infiltrate within the superior segments of the right upper and right lower lobes 11/16/15: Patchy infiltrate left upper lobe consistent with pneumonia 01/14/16 CXR: Patchy alveolar opacities in the left upper lobe 04/07/16 PFTs: mod-severe obstruction with substantial improvement after bronchodilator (FEV1 1.37 > 1.83 liters, 46-62% pred), mild restriction, normal DLCO 04/07/16 6MWT: 192 meters. More limited by LE weakness 04/20/16 HRCT chest: Extensive air trapping, indicating small airways disease. Mild diffuse bronchial wall thickening. Mild patchy regions of ground-glass attenuation in the mid to upper lungs bilaterally. 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 04/25/16 Office spiro: FEV1 2.15 liters (73%) 07/12/16 Respiratory symptoms deteriorated gradually since last course of prednisone. Progressive SOB, increased wheezing and rescue inhaler use X 2-3 weeks.   INTERVAL HISTORY: No major evidence  SUBJ: This is a routine reevaluation.  From a pulmonary point of view she states that she is "doing better".  She is using the albuterol rescue inhaler less than daily.  She remains on prednisone at 5 mg/day.  She expresses concern over weight gain (though our records indicate no evidence of weight gain since last visit).  Otherwise, she has no new complaints. Denies CP, fever, purulent sputum, hemoptysis, LE edema and calf tenderness    OBJ: Vitals:   07/14/17 1551 07/14/17 1555  BP:  (!) 150/108  Pulse:  81  Resp: 16   SpO2:  95%  Weight: 106.6 kg (235 lb)   Height: 5\' 4"  (1.626 m) 5\' 4"  (1.626 m)    Gen: NAD  HEENT: NCAT, sclerae white Lungs: full BS no wheezes Cardiovascular: Reg, no M noted Abdomen: Centripetal obesity, soft, NT +BS Ext: no C/C/E Neuro: grossly intact  DATA CXR: No new film   IMPRESSION: 1)  Dermatomyositis 2) History of recurrent "pneumonias" 3) Moderate persistent asthma with freq exacerbations of asthma/bronchiolitis 4) 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

## 2017-07-17 ENCOUNTER — Encounter: Payer: Self-pay | Admitting: Psychiatry

## 2017-07-17 ENCOUNTER — Ambulatory Visit (INDEPENDENT_AMBULATORY_CARE_PROVIDER_SITE_OTHER): Payer: Medicare Other | Admitting: Psychiatry

## 2017-07-17 ENCOUNTER — Other Ambulatory Visit: Payer: Self-pay

## 2017-07-17 VITALS — BP 128/86 | HR 78 | Temp 98.3°F

## 2017-07-17 DIAGNOSIS — F331 Major depressive disorder, recurrent, moderate: Secondary | ICD-10-CM

## 2017-07-17 DIAGNOSIS — F411 Generalized anxiety disorder: Secondary | ICD-10-CM

## 2017-07-17 MED ORDER — VENLAFAXINE HCL ER 150 MG PO CP24: 300.0000 mg | ORAL_CAPSULE | Freq: Every day | ORAL | 3 refills | Status: DC

## 2017-07-17 MED ORDER — VENLAFAXINE HCL 75 MG PO TABS: 75.0000 mg | ORAL_TABLET | Freq: Every day | ORAL | 3 refills | Status: DC

## 2017-07-17 NOTE — Progress Notes (Signed)
BH MD/PA/NP OP Progress Note  07/17/2017 2:09 PM [name]  MRN:  [id number]  Subjective:   Patient is a 56 year old female who presented for follow up accompanied by her husband. She was sitting in the wheelchair. She reported that she has been doing well on her medications. She reported that she feels anxious and apprehensive most of the time. However she wants to continue the same dose of the Effexor and does not want to change her medications. She reported that she takes pain medications as prescribed. Her husband remains supportive. Patient reported that she has severe pain in her back. Patient has been compliant with her medication and her husband  has been helping her with her medications. She denied having any suicidal homicidal ideations or plans.    No acute issues noted at this time..      Chief Complaint:  Chief Complaint    Follow-up; Medication Refill     Visit Diagnosis:     ICD-10-CM   1. MDD (major depressive disorder), recurrent episode, moderate (HCC) F33.1   2. Anxiety state F41.1     Past Medical History:  Past Medical History:  Diagnosis Date  . Allergic rhinitis   . Anxiety   . Arthralgia   . Asthma   . Bronchitis, chronic ([place])   . Chronic back pain   . Collagenous colitis    followed by Dr Tiffany Kocher  . Depression   . Diplopia 11/15/2013  . Dysphagia   . Endometriosis   . Fibrocystic breast disease   . GERD (gastroesophageal reflux disease)   . History of colon polyps   . Hyperlipidemia   . Hypertension   . Hypokalemia   . IBS (irritable bowel syndrome)   . Memory difficulty 11/15/2013  . Migraine headache   . Pneumonia 2017   x2  . PONV (postoperative nausea and vomiting)    used patch  . Sleep apnea    recommended CPAP  . Ulcer disease   . Urine incontinence     Past Surgical History:  Procedure Laterality Date  . ABDOMINAL HYSTERECTOMY     endometriosis  . BACK SURGERY     x6  . BREAST BIOPSY Right   . COLONOSCOPY WITH PROPOFOL  N/A 06/25/2015   Procedure: COLONOSCOPY WITH PROPOFOL;  Surgeon: Manya Silvas, MD;  Location: [facility] ENDOSCOPY;  Service: Endoscopy;  Laterality: N/A;  . DILATION AND CURETTAGE OF UTERUS     and hysteroscopy  . ESOPHAGOGASTRODUODENOSCOPY N/A 06/25/2015   Procedure: ESOPHAGOGASTRODUODENOSCOPY (EGD);  Surgeon: Manya Silvas, MD;  Location: [facility] ENDOSCOPY;  Service: Endoscopy;  Laterality: N/A;  . insertion of permanent spinal cord stimulator    . insertion of trial spinal cord stimulator    . LUMBAR DISC SURGERY    . LUMBAR FUSION  4/05 and 8/07  . LUMBAR MICRODISCECTOMY  03/28/03   L4-5 L5  . MOUTH SURGERY    . NASAL SEPTOPLASTY W/ TURBINOPLASTY    . removal of spinal cord stimulator    . SEPTOPLASTY     with reduction of turbinates  . THYROIDECTOMY N/A 06/06/2016   Procedure: THYROIDECTOMY;  Surgeon: Robert Bellow, MD;  Location: [facility] ORS;  Service: General;  Laterality: N/A;   Family History:  Family History  Problem Relation Age of Onset  . Allergies Mother   . Hypertension Mother   . Arthritis Mother   . Hyperlipidemia Mother   . Stroke Mother   . Hypertension Father   . Arthritis Father   .  Diabetes Father   . Hypertension Brother   . Diabetes Brother   . Arthritis Maternal Grandmother   . Hyperlipidemia Maternal Grandmother   . Hyperlipidemia Maternal Grandfather   . Arthritis Paternal Grandmother   . Arthritis Paternal Grandfather   . Asthma Unknown        several family member  . Leukemia Unknown        uncle  . Lymphoma Maternal Aunt        non hodgkins   Social History:  Social History   Socioeconomic History  . Marital status: Married    Spouse name: douglas  . Number of children: 1  . Years of education: 33 th  . Highest education level: High school graduate  Social Needs  . Financial resource strain: Not hard at all  . Food insecurity - worry: Never true  . Food insecurity - inability: Never true  . Transportation needs - medical: No  .  Transportation needs - non-medical: No  Occupational History  . Occupation: Disabled    Employer: DISABLED  Tobacco Use  . Smoking status: Never Smoker  . Smokeless tobacco: Never Used  Substance and Sexual Activity  . Alcohol use: No    Alcohol/week: 0.0 oz  . Drug use: No  . Sexual activity: Not Currently  Other Topics Concern  . None  Social History Narrative  . None   Additional History:  Married x 37 years. Has a daughter and has 3 grandsons.  Was following Dr Mamie Nick for almost 3 years.    Assessment:   Musculoskeletal: Strength & Muscle Tone: within normal limits Gait & Station: unsteady Patient leans: N/A  Psychiatric Specialty Exam: Medication Refill     ROS  Blood pressure 128/86, pulse 78, temperature 98.3 F (36.8 C), temperature source Oral.There is no height or weight on file to calculate BMI.  General Appearance: Casual  Eye Contact:  Fair  Speech:  Clear and Coherent  Volume:  Decreased  Mood:  Anxious  Affect:  Congruent and Constricted  Thought Process:  Coherent  Orientation:  Full (Time, Place, and Person)  Thought Content:  WDL  Suicidal Thoughts:  No  Homicidal Thoughts:  No  Memory:  Immediate;   Fair  Judgement:  Fair  Insight:  Fair  Psychomotor Activity:  Decreased  Concentration:  Fair  Recall:  [employer] of Knowledge: Fair  Language: Fair  Akathisia:  No  Handed:  Right  AIMS (if indicated):    Assets:  Communication Skills Desire for Improvement Physical Health Social Support  ADL's:  Intact  Cognition: WNL  Sleep:  6-7   Is the patient at risk to self?  No. Has the patient been a risk to self in the past 6 months?  No. Has the patient been a risk to self within the distant past?  No. Is the patient a risk to others?  No. Has the patient been a risk to others in the past 6 months?  No. Has the patient been a risk to others within the distant past?  No.  Current Medications: Current Outpatient Medications  Medication  Sig Dispense Refill  . albuterol (PROVENTIL HFA;VENTOLIN HFA) 108 (90 Base) MCG/ACT inhaler Inhale 2 puffs into the lungs every 4 (four) hours as needed for wheezing or shortness of breath. 1 Inhaler 3  . albuterol (PROVENTIL) (2.5 MG/3ML) 0.083% nebulizer solution Take 2.5 mg by nebulization every 4 (four) hours as needed for wheezing or shortness of breath.    Marland Kitchen  budesonide-formoterol (SYMBICORT) 160-4.5 MCG/ACT inhaler Inhale 2 puffs into the lungs 2 (two) times daily. 1 Inhaler 12  . diazepam (VALIUM) 5 MG tablet Take 5 mg by mouth 3 (three) times daily as needed.      . docusate sodium (COLACE) 100 MG capsule Take 100 mg by mouth 2 (two) times daily.    Marland Kitchen gabapentin (NEURONTIN) 800 MG tablet Take 800 mg by mouth 4 (four) times daily.  2  . hydrochlorothiazide (HYDRODIURIL) 25 MG tablet Take 25 mg by mouth daily.  11  . KLOR-CON 10 10 MEQ tablet TAKE 1 TABLET (10 MEQ TOTAL) BY MOUTH DAILY. 30 tablet 4  . levothyroxine (SYNTHROID) 125 MCG tablet Take 1 tablet (125 mcg total) by mouth daily before breakfast. 90 tablet 4  . metoprolol tartrate (LOPRESSOR) 50 MG tablet TAKE 1/2 TABLET BY MOUTH TWICE A DAY (NEED OFFICE VISIT) 30 tablet 1  . Multiple Vitamin (MULTIVITAMIN) capsule Take 1 capsule by mouth daily.      Marland Kitchen nystatin (NYSTATIN) powder Apply topically 2 (two) times daily. 15 g 0  . nystatin cream (MYCOSTATIN) Apply 1 application topically 2 (two) times daily. 30 g 0  . Omega-3 Fatty Acids (FISH OIL) 1200 MG CAPS Take 1 capsule by mouth daily.    Dellia Nims CALCIUM + D3 500-200 MG-UNIT TABS TAKE 2 TABLETS BY MOUTH 2 (TWO) TIMES DAILY.  6  . OS-CAL CALCIUM + D3 500-200 MG-UNIT TABS TAKE 2 TABLETS BY MOUTH 2 (TWO) TIMES DAILY. 160 tablet 5  . Oxycodone HCl 10 MG TABS Take 10 mg by mouth 3 (three) times daily as needed.  0  . pantoprazole (PROTONIX) 40 MG tablet Take 1 tablet (40 mg total) by mouth daily. 30 tablet 2  . pravastatin (PRAVACHOL) 10 MG tablet TAKE 1 TABLET (10 MG TOTAL) BY MOUTH  DAILY. 45 tablet 5  . predniSONE (DELTASONE) 5 MG tablet TAKE 1 TABLET (5 MG TOTAL) BY MOUTH DAILY WITH BREAKFAST. 30 tablet 4  . RELISTOR 150 MG TABS TAKE 3 TABLETS BY MOUTH ONCE A DAY prn  2  . triamcinolone cream (KENALOG) 0.1 % Apply 1 application topically 2 (two) times daily. Do not use more than 7-10 days in one area 30 g 0  . UNABLE TO FIND Med Name: I Cool 1 daily for menopause    . venlafaxine (EFFEXOR) 75 MG tablet Take 1 tablet (75 mg total) by mouth daily. 30 tablet 1  . venlafaxine XR (EFFEXOR-XR) 150 MG 24 hr capsule Take 2 capsules (300 mg total) by mouth daily with breakfast. 60 capsule 1  . XTAMPZA ER 36 MG C12A TAKE 1 CAPSULE BY MOUTH EVERY 12 HOURS  0  . zolpidem (AMBIEN) 10 MG tablet Take 10 mg by mouth at bedtime as needed.       No current facility-administered medications for this visit.      Medical Decision Making:  Review of Psycho-Social Stressors (1)  Treatment Plan Summary:Medication management  I reviewed all the medications with the patient and her husband. She is getting Valium and Ambien from the [place] [facility]. Continue  Effexor 375 mg daily. She will follow-up in 3  months or earlier     More than 50% of the time spent in psychoeducation, counseling and coordination of care.    This note was generated in part or whole with voice recognition software. Voice regonition is usually quite accurate but there are transcription errors that can and very often do occur. I apologize for any  typographical errors that were not detected and corrected.     Rainey Pines, MD    07/17/2017, 2:09 PM

## 2017-07-18 ENCOUNTER — Encounter: Payer: Self-pay | Admitting: Pulmonary Disease

## 2017-07-19 ENCOUNTER — Telehealth: Payer: Self-pay | Admitting: Pulmonary Disease

## 2017-07-19 MED ORDER — LEVOFLOXACIN 500 MG PO TABS: 500.0000 mg | ORAL_TABLET | Freq: Every day | ORAL | 0 refills | Status: AC

## 2017-07-19 MED ORDER — PREDNISONE 20 MG PO TABS: 40.0000 mg | ORAL_TABLET | Freq: Every day | ORAL | 0 refills | Status: AC

## 2017-07-19 NOTE — Telephone Encounter (Signed)
Left message

## 2017-07-19 NOTE — Telephone Encounter (Signed)
Please call [name] Husband patient is going to go lay down

## 2017-07-19 NOTE — Telephone Encounter (Signed)
I have placed order for Levofloxacin for 5 days and prednisone 40 mg daily for 5 days  [name]

## 2017-07-19 NOTE — Telephone Encounter (Signed)
Patient has developed cough wheezing sob and head congestion with green mucuos sore throat fever   Patient wants to know if Dr. Alva Garnet can send in a rx for antibiotics and prednisone   Patient just seen on 11/30  Please call

## 2017-07-19 NOTE — Telephone Encounter (Signed)
Please advise: According to chart patient is on 5mg  of Prednisone currently.   Patient has developed cough wheezing sob and head congestion with green mucuos sore throat fever   Patient wants to know if Dr. Alva Garnet can send in a rx for antibiotics and prednisone   Patient just seen on 11/30

## 2017-07-20 NOTE — Telephone Encounter (Signed)
Called and left detailed message that abx and prednisone sent to pharmacy. Nothing further needed.

## 2017-07-28 DIAGNOSIS — M62830 Muscle spasm of back: Secondary | ICD-10-CM | POA: Diagnosis not present

## 2017-07-28 DIAGNOSIS — G4700 Insomnia, unspecified: Secondary | ICD-10-CM | POA: Diagnosis not present

## 2017-07-28 DIAGNOSIS — G894 Chronic pain syndrome: Secondary | ICD-10-CM | POA: Diagnosis not present

## 2017-07-28 DIAGNOSIS — M47817 Spondylosis without myelopathy or radiculopathy, lumbosacral region: Secondary | ICD-10-CM | POA: Diagnosis not present

## 2017-08-05 ENCOUNTER — Other Ambulatory Visit: Payer: Self-pay | Admitting: Internal Medicine

## 2017-08-14 ENCOUNTER — Other Ambulatory Visit: Payer: Self-pay | Admitting: Internal Medicine

## 2017-08-29 DIAGNOSIS — G4700 Insomnia, unspecified: Secondary | ICD-10-CM | POA: Diagnosis not present

## 2017-08-29 DIAGNOSIS — M47817 Spondylosis without myelopathy or radiculopathy, lumbosacral region: Secondary | ICD-10-CM | POA: Diagnosis not present

## 2017-08-29 DIAGNOSIS — G894 Chronic pain syndrome: Secondary | ICD-10-CM | POA: Diagnosis not present

## 2017-08-29 DIAGNOSIS — M62830 Muscle spasm of back: Secondary | ICD-10-CM | POA: Diagnosis not present

## 2017-09-04 ENCOUNTER — Other Ambulatory Visit: Payer: Self-pay | Admitting: Internal Medicine

## 2017-09-05 ENCOUNTER — Ambulatory Visit (INDEPENDENT_AMBULATORY_CARE_PROVIDER_SITE_OTHER): Payer: Medicare Other | Admitting: Internal Medicine

## 2017-09-05 ENCOUNTER — Encounter: Payer: Self-pay | Admitting: Internal Medicine

## 2017-09-05 DIAGNOSIS — J4520 Mild intermittent asthma, uncomplicated: Secondary | ICD-10-CM | POA: Diagnosis not present

## 2017-09-05 DIAGNOSIS — E7800 Pure hypercholesterolemia, unspecified: Secondary | ICD-10-CM | POA: Diagnosis not present

## 2017-09-05 DIAGNOSIS — G4733 Obstructive sleep apnea (adult) (pediatric): Secondary | ICD-10-CM | POA: Diagnosis not present

## 2017-09-05 DIAGNOSIS — I10 Essential (primary) hypertension: Secondary | ICD-10-CM

## 2017-09-05 DIAGNOSIS — K52831 Collagenous colitis: Secondary | ICD-10-CM

## 2017-09-05 DIAGNOSIS — K219 Gastro-esophageal reflux disease without esophagitis: Secondary | ICD-10-CM

## 2017-09-05 DIAGNOSIS — R0602 Shortness of breath: Secondary | ICD-10-CM

## 2017-09-05 DIAGNOSIS — M3390 Dermatopolymyositis, unspecified, organ involvement unspecified: Secondary | ICD-10-CM | POA: Diagnosis not present

## 2017-09-05 DIAGNOSIS — F329 Major depressive disorder, single episode, unspecified: Secondary | ICD-10-CM | POA: Diagnosis not present

## 2017-09-05 DIAGNOSIS — F32A Depression, unspecified: Secondary | ICD-10-CM

## 2017-09-05 MED ORDER — AMLODIPINE BESYLATE 5 MG PO TABS: 5.0000 mg | ORAL_TABLET | Freq: Every day | ORAL | 2 refills | Status: DC

## 2017-09-05 NOTE — Progress Notes (Signed)
Patient ID: ARANTZA DARRINGTON, female   DOB: 17-Oct-1960, 57 y.o.   MRN: [id number]   Subjective:    Patient ID: BERRY GALLACHER, female    DOB: 06-29-1961, 57 y.o.   MRN: [id number]  HPI  Patient here for a scheduled follow up.  She is accompanied by her husband.  History obtained from both of them.  Saw cardiology.  Had negative stress test.  Recommend using BiPAP.  Sees pulmonary.  symbicort increased.  This has helped her breathing.  No chest pain.  No acid reflux reported.  No abdominal pain.  Bowels moving.  Blood pressure elevated.  Has a history of dermatomyositis.  Overdue f/u with rheumatology.  Discussed the need for her to watch her diet and decrease soda intake.  Also discussed the need for increased activity.     Past Medical History:  Diagnosis Date  . Allergic rhinitis   . Anxiety   . Arthralgia   . Asthma   . Bronchitis, chronic ([place])   . Chronic back pain   . Collagenous colitis    followed by Dr Tiffany Kocher  . Depression   . Diplopia 11/15/2013  . Dysphagia   . Endometriosis   . Fibrocystic breast disease   . GERD (gastroesophageal reflux disease)   . History of colon polyps   . Hyperlipidemia   . Hypertension   . Hypokalemia   . IBS (irritable bowel syndrome)   . Memory difficulty 11/15/2013  . Migraine headache   . Pneumonia 2017   x2  . PONV (postoperative nausea and vomiting)    used patch  . Sleep apnea    recommended CPAP  . Ulcer disease   . Urine incontinence    Past Surgical History:  Procedure Laterality Date  . ABDOMINAL HYSTERECTOMY     endometriosis  . BACK SURGERY     x6  . BREAST BIOPSY Right   . COLONOSCOPY WITH PROPOFOL N/A 06/25/2015   Procedure: COLONOSCOPY WITH PROPOFOL;  Surgeon: Manya Silvas, MD;  Location: [facility] ENDOSCOPY;  Service: Endoscopy;  Laterality: N/A;  . DILATION AND CURETTAGE OF UTERUS     and hysteroscopy  . ESOPHAGOGASTRODUODENOSCOPY N/A 06/25/2015   Procedure: ESOPHAGOGASTRODUODENOSCOPY (EGD);  Surgeon: Manya Silvas,  MD;  Location: [facility] ENDOSCOPY;  Service: Endoscopy;  Laterality: N/A;  . insertion of permanent spinal cord stimulator    . insertion of trial spinal cord stimulator    . LUMBAR DISC SURGERY    . LUMBAR FUSION  4/05 and 8/07  . LUMBAR MICRODISCECTOMY  03/28/03   L4-5 L5  . MOUTH SURGERY    . NASAL SEPTOPLASTY W/ TURBINOPLASTY    . removal of spinal cord stimulator    . SEPTOPLASTY     with reduction of turbinates  . THYROIDECTOMY N/A 06/06/2016   Procedure: THYROIDECTOMY;  Surgeon: Robert Bellow, MD;  Location: [facility] ORS;  Service: General;  Laterality: N/A;   Family History  Problem Relation Age of Onset  . Allergies Mother   . Hypertension Mother   . Arthritis Mother   . Hyperlipidemia Mother   . Stroke Mother   . Hypertension Father   . Arthritis Father   . Diabetes Father   . Hypertension Brother   . Diabetes Brother   . Arthritis Maternal Grandmother   . Hyperlipidemia Maternal Grandmother   . Hyperlipidemia Maternal Grandfather   . Arthritis Paternal Grandmother   . Arthritis Paternal Grandfather   . Asthma Unknown  several family member  . Leukemia Unknown        uncle  . Lymphoma Maternal Aunt        non hodgkins   Social History   Socioeconomic History  . Marital status: Married    Spouse name: douglas  . Number of children: 1  . Years of education: 60 th  . Highest education level: High school graduate  Social Needs  . Financial resource strain: Not hard at all  . Food insecurity - worry: Never true  . Food insecurity - inability: Never true  . Transportation needs - medical: No  . Transportation needs - non-medical: No  Occupational History  . Occupation: Disabled    Employer: DISABLED  Tobacco Use  . Smoking status: Never Smoker  . Smokeless tobacco: Never Used  Substance and Sexual Activity  . Alcohol use: No    Alcohol/week: 0.0 oz  . Drug use: No  . Sexual activity: Not Currently  Other Topics Concern  . None  Social History  Narrative  . None    Outpatient Encounter Medications as of 09/05/2017  Medication Sig  . albuterol (PROVENTIL HFA;VENTOLIN HFA) 108 (90 Base) MCG/ACT inhaler Inhale 2 puffs into the lungs every 4 (four) hours as needed for wheezing or shortness of breath.  Marland Kitchen albuterol (PROVENTIL) (2.5 MG/3ML) 0.083% nebulizer solution Take 2.5 mg by nebulization every 4 (four) hours as needed for wheezing or shortness of breath.  Marland Kitchen amLODipine (NORVASC) 5 MG tablet Take 1 tablet (5 mg total) by mouth daily.  . budesonide-formoterol (SYMBICORT) 160-4.5 MCG/ACT inhaler Inhale 2 puffs into the lungs 2 (two) times daily.  . diazepam (VALIUM) 5 MG tablet Take 5 mg by mouth 3 (three) times daily as needed.    . docusate sodium (COLACE) 100 MG capsule Take 100 mg by mouth 2 (two) times daily.  Marland Kitchen gabapentin (NEURONTIN) 800 MG tablet Take 800 mg by mouth 4 (four) times daily.  . hydrochlorothiazide (HYDRODIURIL) 25 MG tablet Take 25 mg by mouth daily.  Marland Kitchen levothyroxine (SYNTHROID) 125 MCG tablet Take 1 tablet (125 mcg total) by mouth daily before breakfast.  . metoprolol tartrate (LOPRESSOR) 50 MG tablet TAKE 1/2 TABLET BY MOUTH TWICE A DAY (NEED OFFICE VISIT)  . Multiple Vitamin (MULTIVITAMIN) capsule Take 1 capsule by mouth daily.    Marland Kitchen nystatin (NYSTATIN) powder Apply topically 2 (two) times daily.  Marland Kitchen nystatin cream (MYCOSTATIN) Apply 1 application topically 2 (two) times daily.  . Omega-3 Fatty Acids (FISH OIL) 1200 MG CAPS Take 1 capsule by mouth daily.  Dellia Nims CALCIUM + D3 500-200 MG-UNIT TABS TAKE 2 TABLETS BY MOUTH 2 (TWO) TIMES DAILY.  Marland Kitchen OS-CAL CALCIUM + D3 500-200 MG-UNIT TABS TAKE 2 TABLETS BY MOUTH 2 (TWO) TIMES DAILY.  Marland Kitchen Oxycodone HCl 10 MG TABS Take 10 mg by mouth 3 (three) times daily as needed.  . pantoprazole (PROTONIX) 40 MG tablet TAKE 1 TABLET BY MOUTH EVERY DAY  . pravastatin (PRAVACHOL) 10 MG tablet TAKE 1 TABLET (10 MG TOTAL) BY MOUTH DAILY.  Marland Kitchen predniSONE (DELTASONE) 5 MG tablet TAKE 1 TABLET (5  MG TOTAL) BY MOUTH DAILY WITH BREAKFAST.  Marland Kitchen RELISTOR 150 MG TABS TAKE 3 TABLETS BY MOUTH ONCE A DAY prn  . triamcinolone cream (KENALOG) 0.1 % Apply 1 application topically 2 (two) times daily. Do not use more than 7-10 days in one area  . UNABLE TO FIND Med Name: I Cool 1 daily for menopause  . venlafaxine (EFFEXOR) 75 MG tablet  Take 1 tablet (75 mg total) by mouth daily.  Marland Kitchen venlafaxine XR (EFFEXOR-XR) 150 MG 24 hr capsule Take 2 capsules (300 mg total) by mouth daily with breakfast.  . XTAMPZA ER 36 MG C12A TAKE 1 CAPSULE BY MOUTH EVERY 12 HOURS  . zolpidem (AMBIEN) 10 MG tablet Take 10 mg by mouth at bedtime as needed.    . [DISCONTINUED] KLOR-CON 10 10 MEQ tablet TAKE 1 TABLET (10 MEQ TOTAL) BY MOUTH DAILY.   No facility-administered encounter medications on file as of 09/05/2017.     Review of Systems  Constitutional: Negative for appetite change and unexpected weight change.  HENT: Negative for congestion and sinus pressure.   Respiratory: Negative for cough and chest tightness.        Breathing stable.   Cardiovascular: Negative for chest pain, palpitations and leg swelling.  Gastrointestinal: Negative for abdominal pain, diarrhea and nausea.  Genitourinary: Negative for difficulty urinating and dysuria.  Musculoskeletal: Negative for myalgias.  Skin: Negative for color change and rash.  Neurological: Negative for dizziness, light-headedness and headaches.  Psychiatric/Behavioral: Negative for agitation and dysphoric mood.       Objective:     Pulse:  76  Physical Exam  Constitutional: She appears well-developed and well-nourished. No distress.  HENT:  Nose: Nose normal.  Mouth/Throat: Oropharynx is clear and moist.  Neck: Neck supple. No thyromegaly present.  Cardiovascular: Normal rate and regular rhythm.  Pulmonary/Chest: Breath sounds normal. No respiratory distress. She has no wheezes.  Abdominal: Soft. Bowel sounds are normal. There is no tenderness.    Musculoskeletal: She exhibits no edema or tenderness.  Lymphadenopathy:    She has no cervical adenopathy.  Skin: No rash noted. No erythema.  Psychiatric: She has a normal mood and affect. Her behavior is normal.    BP (!) 148/88 (BP Location: Left Arm, Patient Position: Sitting, Cuff Size: Large)   Pulse 88   Temp 98.3 F (36.8 C) (Oral)   Resp 18   Wt 242 lb 3.2 oz (109.9 kg)   SpO2 97%   BMI 41.57 kg/m  Wt Readings from Last 3 Encounters:  09/05/17 242 lb 3.2 oz (109.9 kg)  07/14/17 235 lb (106.6 kg)  04/10/17 234 lb (106.1 kg)     Lab Results  Component Value Date   WBC 9.8 12/26/2016   HGB 14.6 12/26/2016   HCT 43.6 12/26/2016   PLT 299 12/26/2016   GLUCOSE 92 12/08/2016   CHOL 227 (H) 12/08/2016   TRIG 226.0 (H) 12/08/2016   HDL 62.20 12/08/2016   LDLDIRECT 127.0 12/08/2016   LDLCALC 121 (H) 07/12/2016   ALT 20 12/08/2016   AST 21 12/08/2016   NA 145 12/08/2016   K 3.6 12/08/2016   CL 104 12/08/2016   CREATININE 0.75 12/08/2016   BUN 9 12/08/2016   CO2 34 (H) 12/08/2016   TSH 3.38 12/08/2016    Ct Lumbar Spine Wo Contrast  Result Date: 05/24/2017 CLINICAL DATA:  Low back pain and difficulty standing EXAM: CT LUMBAR SPINE WITHOUT CONTRAST TECHNIQUE: Multidetector CT imaging of the lumbar spine was performed without intravenous contrast administration. Multiplanar CT image reconstructions were also generated. COMPARISON:  CT lumbar spine 08/26/2014 FINDINGS: Segmentation: 5 lumbar type vertebrae. Alignment: Normal. Vertebrae: PLIF at L4-S1. No abnormal lucency surrounding the screws. There is partial osseous fusion anteriorly at the L4-L5 and L5-S1 levels. On the left, the posterior fusion mass is complete from L4-S1. Complete L5-S1 fusion on the right. Paraspinal and other soft tissues: Negative.  Disc levels: The spinal canal is decompressed at the L4-L5 and L5-S1 levels. There are small disc bulges at L2-L3 and L3-L4 without advanced spinal canal or neural  foraminal stenosis. IMPRESSION: 1. Unchanged appearance of L4-S1 PLIF without hardware abnormality. 2. Disc bulges at L2-L3 and L3-L4 without advanced spinal canal or neural foraminal stenosis. Electronically Signed   By: Ulyses Jarred M.D.   On: 05/24/2017 18:26       Assessment & Plan:   Problem List Items Addressed This Visit    Asthma    Being followed by pulmonary.       Collagenous colitis    Continue f/u with GI.        Depression    Seeing psychiatry.        DERMATOMYOSITIS    Has been followed by Dr Jefm Bryant.  Seeing pulmonary.  Overdue f/u with rheumatology.  Schedule f/u.       Relevant Orders   Ambulatory referral to Rheumatology   Essential hypertension, benign    Blood pressure elevated.  Restart amlodipine 5mg  q day.  Follow pressures.  Follow metabolic panel.        Relevant Medications   amLODipine (NORVASC) 5 MG tablet   GERD    No increased acid reflux reported.  Follow.        Hypercholesterolemia    On pravastatin.  Low cholesterol diet and exercise.  Follow lipid panel and liver function tests.        Relevant Medications   amLODipine (NORVASC) 5 MG tablet   Obstructive sleep apnea    Needs BiPAP.       SOB (shortness of breath)    Had cardiac w/up.  Unrevealing.  Saw pulmonary.  Increased symbicort.  On low dose prednisone.  Breathing stable.  Follow.            Einar Pheasant, MD

## 2017-09-09 ENCOUNTER — Encounter: Payer: Self-pay | Admitting: Internal Medicine

## 2017-09-09 NOTE — Assessment & Plan Note (Signed)
Blood pressure elevated.  Restart amlodipine 5mg  q day.  Follow pressures.  Follow metabolic panel.

## 2017-09-09 NOTE — Assessment & Plan Note (Signed)
Continue f/u with GI

## 2017-09-09 NOTE — Assessment & Plan Note (Signed)
Seeing psychiatry

## 2017-09-09 NOTE — Assessment & Plan Note (Signed)
Has been followed by Dr Jefm Bryant.  Seeing pulmonary.  Overdue f/u with rheumatology.  Schedule f/u.

## 2017-09-09 NOTE — Assessment & Plan Note (Signed)
Had cardiac w/up.  Unrevealing.  Saw pulmonary.  Increased symbicort.  On low dose prednisone.  Breathing stable.  Follow.

## 2017-09-09 NOTE — Assessment & Plan Note (Signed)
No increased acid reflux reported.  Follow.  

## 2017-09-09 NOTE — Assessment & Plan Note (Signed)
Needs BiPAP.

## 2017-09-09 NOTE — Assessment & Plan Note (Signed)
Being followed by pulmonary.   

## 2017-09-09 NOTE — Assessment & Plan Note (Signed)
On pravastatin.  Low cholesterol diet and exercise.  Follow lipid panel and liver function tests.   

## 2017-09-13 ENCOUNTER — Other Ambulatory Visit: Payer: Self-pay | Admitting: Internal Medicine

## 2017-09-15 ENCOUNTER — Other Ambulatory Visit: Payer: Medicare Other

## 2017-09-19 ENCOUNTER — Other Ambulatory Visit: Payer: Medicare Other

## 2017-09-22 ENCOUNTER — Other Ambulatory Visit: Payer: Medicare Other

## 2017-09-26 DIAGNOSIS — G894 Chronic pain syndrome: Secondary | ICD-10-CM | POA: Diagnosis not present

## 2017-09-26 DIAGNOSIS — M62830 Muscle spasm of back: Secondary | ICD-10-CM | POA: Diagnosis not present

## 2017-09-26 DIAGNOSIS — M47817 Spondylosis without myelopathy or radiculopathy, lumbosacral region: Secondary | ICD-10-CM | POA: Diagnosis not present

## 2017-09-26 DIAGNOSIS — G4700 Insomnia, unspecified: Secondary | ICD-10-CM | POA: Diagnosis not present

## 2017-09-28 DIAGNOSIS — M79642 Pain in left hand: Secondary | ICD-10-CM | POA: Diagnosis not present

## 2017-09-28 DIAGNOSIS — G8929 Other chronic pain: Secondary | ICD-10-CM | POA: Diagnosis not present

## 2017-09-28 DIAGNOSIS — M79641 Pain in right hand: Secondary | ICD-10-CM | POA: Diagnosis not present

## 2017-09-28 DIAGNOSIS — M25511 Pain in right shoulder: Secondary | ICD-10-CM | POA: Diagnosis not present

## 2017-09-28 DIAGNOSIS — M1990 Unspecified osteoarthritis, unspecified site: Secondary | ICD-10-CM | POA: Diagnosis not present

## 2017-09-28 DIAGNOSIS — M791 Myalgia, unspecified site: Secondary | ICD-10-CM | POA: Diagnosis not present

## 2017-09-28 DIAGNOSIS — M25512 Pain in left shoulder: Secondary | ICD-10-CM | POA: Diagnosis not present

## 2017-09-28 DIAGNOSIS — R21 Rash and other nonspecific skin eruption: Secondary | ICD-10-CM | POA: Diagnosis not present

## 2017-10-03 ENCOUNTER — Other Ambulatory Visit: Payer: Medicare Other

## 2017-10-10 ENCOUNTER — Other Ambulatory Visit: Payer: Medicare Other

## 2017-10-10 ENCOUNTER — Ambulatory Visit: Payer: Medicare Other

## 2017-10-11 ENCOUNTER — Other Ambulatory Visit: Payer: Self-pay | Admitting: General Surgery

## 2017-10-12 ENCOUNTER — Other Ambulatory Visit: Payer: Self-pay | Admitting: Internal Medicine

## 2017-10-19 ENCOUNTER — Other Ambulatory Visit: Payer: Medicare Other

## 2017-10-20 ENCOUNTER — Ambulatory Visit: Payer: Medicare Other | Admitting: Internal Medicine

## 2017-10-24 ENCOUNTER — Other Ambulatory Visit: Payer: Medicare Other

## 2017-10-26 DIAGNOSIS — M62830 Muscle spasm of back: Secondary | ICD-10-CM | POA: Diagnosis not present

## 2017-10-26 DIAGNOSIS — G894 Chronic pain syndrome: Secondary | ICD-10-CM | POA: Diagnosis not present

## 2017-10-26 DIAGNOSIS — G4700 Insomnia, unspecified: Secondary | ICD-10-CM | POA: Diagnosis not present

## 2017-10-26 DIAGNOSIS — M47817 Spondylosis without myelopathy or radiculopathy, lumbosacral region: Secondary | ICD-10-CM | POA: Diagnosis not present

## 2017-10-27 ENCOUNTER — Other Ambulatory Visit: Payer: Self-pay | Admitting: Pulmonary Disease

## 2017-11-06 ENCOUNTER — Other Ambulatory Visit: Payer: Self-pay | Admitting: Physical Medicine and Rehabilitation

## 2017-11-06 ENCOUNTER — Other Ambulatory Visit (HOSPITAL_COMMUNITY): Payer: Self-pay | Admitting: Physical Medicine and Rehabilitation

## 2017-11-06 DIAGNOSIS — M25511 Pain in right shoulder: Secondary | ICD-10-CM

## 2017-11-06 DIAGNOSIS — R200 Anesthesia of skin: Secondary | ICD-10-CM

## 2017-11-10 ENCOUNTER — Other Ambulatory Visit: Payer: Self-pay | Admitting: Physical Medicine and Rehabilitation

## 2017-11-10 ENCOUNTER — Other Ambulatory Visit: Payer: Self-pay | Admitting: Internal Medicine

## 2017-11-10 DIAGNOSIS — M25511 Pain in right shoulder: Secondary | ICD-10-CM

## 2017-11-10 DIAGNOSIS — R200 Anesthesia of skin: Secondary | ICD-10-CM

## 2017-11-14 ENCOUNTER — Other Ambulatory Visit: Payer: Medicare Other

## 2017-11-16 ENCOUNTER — Telehealth: Payer: Self-pay | Admitting: *Deleted

## 2017-11-17 ENCOUNTER — Ambulatory Visit: Payer: 59

## 2017-11-23 ENCOUNTER — Other Ambulatory Visit: Payer: Self-pay | Admitting: Internal Medicine

## 2017-11-23 DIAGNOSIS — Z79891 Long term (current) use of opiate analgesic: Secondary | ICD-10-CM | POA: Diagnosis not present

## 2017-11-23 DIAGNOSIS — G4700 Insomnia, unspecified: Secondary | ICD-10-CM | POA: Diagnosis not present

## 2017-11-23 DIAGNOSIS — M62830 Muscle spasm of back: Secondary | ICD-10-CM | POA: Diagnosis not present

## 2017-11-23 DIAGNOSIS — G894 Chronic pain syndrome: Secondary | ICD-10-CM | POA: Diagnosis not present

## 2017-11-23 DIAGNOSIS — M47817 Spondylosis without myelopathy or radiculopathy, lumbosacral region: Secondary | ICD-10-CM | POA: Diagnosis not present

## 2017-11-28 ENCOUNTER — Ambulatory Visit: Payer: Medicare Other | Admitting: Pulmonary Disease

## 2017-12-01 ENCOUNTER — Other Ambulatory Visit: Payer: Self-pay | Admitting: Pulmonary Disease

## 2017-12-04 ENCOUNTER — Ambulatory Visit: Payer: Medicare Other | Admitting: Psychiatry

## 2017-12-09 ENCOUNTER — Other Ambulatory Visit: Payer: Self-pay | Admitting: Internal Medicine

## 2017-12-09 ENCOUNTER — Other Ambulatory Visit: Payer: Self-pay | Admitting: Psychiatry

## 2017-12-09 DIAGNOSIS — F331 Major depressive disorder, recurrent, moderate: Secondary | ICD-10-CM

## 2017-12-11 ENCOUNTER — Ambulatory Visit: Payer: Medicare Other | Admitting: Pulmonary Disease

## 2017-12-21 DIAGNOSIS — M62830 Muscle spasm of back: Secondary | ICD-10-CM | POA: Diagnosis not present

## 2017-12-21 DIAGNOSIS — G4700 Insomnia, unspecified: Secondary | ICD-10-CM | POA: Diagnosis not present

## 2017-12-21 DIAGNOSIS — M47817 Spondylosis without myelopathy or radiculopathy, lumbosacral region: Secondary | ICD-10-CM | POA: Diagnosis not present

## 2017-12-21 DIAGNOSIS — G894 Chronic pain syndrome: Secondary | ICD-10-CM | POA: Diagnosis not present

## 2017-12-28 ENCOUNTER — Encounter: Payer: Self-pay | Admitting: Psychiatry

## 2017-12-28 ENCOUNTER — Ambulatory Visit (INDEPENDENT_AMBULATORY_CARE_PROVIDER_SITE_OTHER): Payer: Medicare Other | Admitting: Psychiatry

## 2017-12-28 VITALS — BP 154/68 | HR 88 | Temp 98.1°F | Wt 245.8 lb

## 2017-12-28 DIAGNOSIS — F331 Major depressive disorder, recurrent, moderate: Secondary | ICD-10-CM

## 2017-12-28 DIAGNOSIS — F411 Generalized anxiety disorder: Secondary | ICD-10-CM | POA: Diagnosis not present

## 2017-12-28 MED ORDER — FLUOXETINE HCL 20 MG PO TABS: 20.0000 mg | ORAL_TABLET | Freq: Every day | ORAL | 1 refills | Status: DC

## 2017-12-28 MED ORDER — VENLAFAXINE HCL ER 150 MG PO CP24: 150.0000 mg | ORAL_CAPSULE | Freq: Every day | ORAL | 1 refills | Status: DC

## 2017-12-28 NOTE — Progress Notes (Signed)
BH MD/PA/NP OP Progress Note  12/28/2017 1:32 PM [name]  MRN:  [id number]  Subjective:   Patient is a 57 year old female who presented for follow up accompanied by her husband. She was last seen in December.  Patient reported that she continues to have severe pain in her back related to bulging disc and spinal stenosis.  She reported that she is unable to walk.  She has been following with the pain clinic in [place].  Patient reported that they have been trying her on several different medications and she is on chronic pain management.  She uses a walker at home.  Patient reported that she drinks Coke all day long and has been feeling dehydrated and has severe headache.  She ran out of her venlafaxine last week as she missed her appointment.  She reported that she wants to have her medications adjusted as she feels that the medication is not helping her.  Her husband remains supportive.  She reported that she did well on the Prozac in the past and would like to try the medication again.  Patient reported that she does not sleep well at night.  She is getting the Ambien and the diazepam from her pain management on a regular basis.   She is receptive to medication changes at this time.        Chief Complaint:  Chief Complaint    Follow-up; Medication Refill     Visit Diagnosis:     ICD-10-CM   1. MDD (major depressive disorder), recurrent episode, moderate (HCC) F33.1   2. Anxiety state F41.1     Past Medical History:  Past Medical History:  Diagnosis Date  . Allergic rhinitis   . Anxiety   . Arthralgia   . Asthma   . Bronchitis, chronic ([place])   . Chronic back pain   . Collagenous colitis    followed by Dr Tiffany Kocher  . Depression   . Diplopia 11/15/2013  . Dysphagia   . Endometriosis   . Fibrocystic breast disease   . GERD (gastroesophageal reflux disease)   . History of colon polyps   . Hyperlipidemia   . Hypertension   . Hypokalemia   . IBS (irritable bowel  syndrome)   . Memory difficulty 11/15/2013  . Migraine headache   . Pneumonia 2017   x2  . PONV (postoperative nausea and vomiting)    used patch  . Sleep apnea    recommended CPAP  . Ulcer disease   . Urine incontinence     Past Surgical History:  Procedure Laterality Date  . ABDOMINAL HYSTERECTOMY     endometriosis  . BACK SURGERY     x6  . BREAST BIOPSY Right   . COLONOSCOPY WITH PROPOFOL N/A 06/25/2015   Procedure: COLONOSCOPY WITH PROPOFOL;  Surgeon: Manya Silvas, MD;  Location: [facility] ENDOSCOPY;  Service: Endoscopy;  Laterality: N/A;  . DILATION AND CURETTAGE OF UTERUS     and hysteroscopy  . ESOPHAGOGASTRODUODENOSCOPY N/A 06/25/2015   Procedure: ESOPHAGOGASTRODUODENOSCOPY (EGD);  Surgeon: Manya Silvas, MD;  Location: [facility] ENDOSCOPY;  Service: Endoscopy;  Laterality: N/A;  . insertion of permanent spinal cord stimulator    . insertion of trial spinal cord stimulator    . LUMBAR DISC SURGERY    . LUMBAR FUSION  4/05 and 8/07  . LUMBAR MICRODISCECTOMY  03/28/03   L4-5 L5  . MOUTH SURGERY    . NASAL SEPTOPLASTY W/ TURBINOPLASTY    . removal of spinal cord stimulator    .  SEPTOPLASTY     with reduction of turbinates  . THYROIDECTOMY N/A 06/06/2016   Procedure: THYROIDECTOMY;  Surgeon: Robert Bellow, MD;  Location: [facility] ORS;  Service: General;  Laterality: N/A;   Family History:  Family History  Problem Relation Age of Onset  . Allergies Mother   . Hypertension Mother   . Arthritis Mother   . Hyperlipidemia Mother   . Stroke Mother   . Hypertension Father   . Arthritis Father   . Diabetes Father   . Hypertension Brother   . Diabetes Brother   . Arthritis Maternal Grandmother   . Hyperlipidemia Maternal Grandmother   . Hyperlipidemia Maternal Grandfather   . Arthritis Paternal Grandmother   . Arthritis Paternal Grandfather   . Asthma Unknown        several family member  . Leukemia Unknown        uncle  . Lymphoma Maternal Aunt        non hodgkins    Social History:  Social History   Socioeconomic History  . Marital status: Married    Spouse name: douglas  . Number of children: 1  . Years of education: 34 th  . Highest education level: High school graduate  Occupational History  . Occupation: Disabled    Employer: DISABLED  Social Needs  . Financial resource strain: Not hard at all  . Food insecurity:    Worry: Never true    Inability: Never true  . Transportation needs:    Medical: No    Non-medical: No  Tobacco Use  . Smoking status: Never Smoker  . Smokeless tobacco: Never Used  Substance and Sexual Activity  . Alcohol use: No    Alcohol/week: 0.0 oz  . Drug use: No  . Sexual activity: Not Currently  Lifestyle  . Physical activity:    Days per week: 0 days    Minutes per session: 0 min  . Stress: Very much  Relationships  . Social connections:    Talks on phone: Three times a week    Gets together: Never    Attends religious service: More than 4 times per year    Active member of club or organization: No    Attends meetings of clubs or organizations: Never    Relationship status: Not on file  Other Topics Concern  . Not on file  Social History Narrative  . Not on file   Additional History:  Married x 37 years. Has a daughter and has 3 grandsons.  Was following Dr Mamie Nick for almost 3 years.    Assessment:   Musculoskeletal: Strength & Muscle Tone: within normal limits Gait & Station: unsteady Patient leans: N/A  Psychiatric Specialty Exam: Medication Refill     ROS  Blood pressure (!) 154/68, pulse 88, temperature 98.1 F (36.7 C), temperature source Oral, weight 245 lb 12.8 oz (111.5 kg).Body mass index is 42.19 kg/m.  General Appearance: Casual  Eye Contact:  Fair  Speech:  Clear and Coherent  Volume:  Decreased  Mood:  Anxious  Affect:  Congruent and Constricted  Thought Process:  Coherent  Orientation:  Full (Time, Place, and Person)  Thought Content:  WDL  Suicidal Thoughts:  No   Homicidal Thoughts:  No  Memory:  Immediate;   Fair  Judgement:  Fair  Insight:  Fair  Psychomotor Activity:  Decreased  Concentration:  Fair  Recall:  [employer] of Knowledge: Fair  Language: Fair  Akathisia:  No  Handed:  Right  AIMS (if indicated):    Assets:  Communication Skills Desire for Improvement Physical Health Social Support  ADL's:  Intact  Cognition: WNL  Sleep:  6-7   Is the patient at risk to self?  No. Has the patient been a risk to self in the past 6 months?  No. Has the patient been a risk to self within the distant past?  No. Is the patient a risk to others?  No. Has the patient been a risk to others in the past 6 months?  No. Has the patient been a risk to others within the distant past?  No.  Current Medications: Current Outpatient Medications  Medication Sig Dispense Refill  . albuterol (PROVENTIL HFA;VENTOLIN HFA) 108 (90 Base) MCG/ACT inhaler Inhale 2 puffs into the lungs every 4 (four) hours as needed for wheezing or shortness of breath. 1 Inhaler 3  . albuterol (PROVENTIL) (2.5 MG/3ML) 0.083% nebulizer solution Take 2.5 mg by nebulization every 4 (four) hours as needed for wheezing or shortness of breath.    Marland Kitchen amLODipine (NORVASC) 5 MG tablet TAKE 1 TABLET BY MOUTH EVERY DAY 30 tablet 3  . bisacodyl (DULCOLAX) 5 MG EC tablet Take by mouth.    . budesonide-formoterol (SYMBICORT) 160-4.5 MCG/ACT inhaler Inhale 2 puffs into the lungs 2 (two) times daily. 1 Inhaler 12  . diazepam (VALIUM) 5 MG tablet Take 5 mg by mouth 3 (three) times daily as needed.      . docusate sodium (COLACE) 100 MG capsule Take 100 mg by mouth 2 (two) times daily.    Marland Kitchen gabapentin (NEURONTIN) 800 MG tablet Take 800 mg by mouth 4 (four) times daily.  2  . GRALISE 600 MG TABS     . hydrochlorothiazide (HYDRODIURIL) 25 MG tablet Take 25 mg by mouth daily.  11  . KLOR-CON 10 10 MEQ tablet TAKE 1 TABLET (10 MEQ TOTAL) BY MOUTH DAILY. 30 tablet 4  . levothyroxine (SYNTHROID,  LEVOTHROID) 125 MCG tablet TAKE 1 TABLET (125 MCG TOTAL) BY MOUTH DAILY BEFORE BREAKFAST. 90 tablet 2  . metoprolol tartrate (LOPRESSOR) 50 MG tablet TAKE 1/2 TABLET BY MOUTH TWICE A DAY (NEED OFFICE VISIT) 30 tablet 3  . mometasone (NASONEX) 50 MCG/ACT nasal spray Place into the nose.    . Multiple Vitamin (MULTIVITAMIN) capsule Take 1 capsule by mouth daily.      Marland Kitchen nystatin (NYSTATIN) powder Apply topically 2 (two) times daily. 15 g 0  . nystatin cream (MYCOSTATIN) Apply 1 application topically 2 (two) times daily. 30 g 0  . Omega-3 Fatty Acids (FISH OIL) 1200 MG CAPS Take 1 capsule by mouth daily.    Dellia Nims CALCIUM + D3 500-200 MG-UNIT TABS TAKE 2 TABLETS BY MOUTH 2 (TWO) TIMES DAILY.  6  . Oxycodone HCl 10 MG TABS Take 10 mg by mouth 3 (three) times daily as needed.  0  . pantoprazole (PROTONIX) 40 MG tablet TAKE 1 TABLET BY MOUTH EVERY DAY 30 tablet 2  . pravastatin (PRAVACHOL) 10 MG tablet TAKE 1 TABLET (10 MG TOTAL) BY MOUTH DAILY. 45 tablet 5  . predniSONE (DELTASONE) 5 MG tablet TAKE 1 TABLET BY MOUTH EVERY DAY WITH BREAKFAST 30 tablet 0  . RELISTOR 150 MG TABS TAKE 3 TABLETS BY MOUTH ONCE A DAY prn  2  . triamcinolone cream (KENALOG) 0.1 % Apply 1 application topically 2 (two) times daily. Do not use more than 7-10 days in one area 30 g 0  . venlafaxine (EFFEXOR) 75 MG tablet Take  1 tablet (75 mg total) by mouth daily. 30 tablet 3  . venlafaxine XR (EFFEXOR-XR) 150 MG 24 hr capsule Take 2 capsules (300 mg total) by mouth daily with breakfast. 60 capsule 3  . XTAMPZA ER 36 MG C12A TAKE 1 CAPSULE BY MOUTH EVERY 12 HOURS  0  . zolpidem (AMBIEN) 10 MG tablet Take 10 mg by mouth at bedtime as needed.       No current facility-administered medications for this visit.      Medical Decision Making:  Review of Psycho-Social Stressors (1)  Treatment Plan Summary:Medication management  I reviewed all the medications with the patient and her husband. She is getting Valium and Ambien from  the [place] pain clinic. I will change her Effexor 150 mg in the morning as she already ran out of the medication 1 week ago. We will start her on Prozac 20 mg in the morning.  Patient to call the office if she notices worsening of her symptoms.  She will follow-up in 2 months or earlier depending on her symptoms.       More than 50% of the time spent in psychoeducation, counseling and coordination of care.    This note was generated in part or whole with voice recognition software. Voice regonition is usually quite accurate but there are transcription errors that can and very often do occur. I apologize for any typographical errors that were not detected and corrected.     Rainey Pines, MD    12/28/2017, 1:32 PM

## 2018-01-10 ENCOUNTER — Other Ambulatory Visit (INDEPENDENT_AMBULATORY_CARE_PROVIDER_SITE_OTHER): Payer: Medicare Other

## 2018-01-10 DIAGNOSIS — I10 Essential (primary) hypertension: Secondary | ICD-10-CM

## 2018-01-10 DIAGNOSIS — E7800 Pure hypercholesterolemia, unspecified: Secondary | ICD-10-CM

## 2018-01-10 LAB — HEPATIC FUNCTION PANEL
ALT: 47 U/L — ABNORMAL HIGH (ref 0–35)
AST: 51 U/L — ABNORMAL HIGH (ref 0–37)
Albumin: 3.9 g/dL (ref 3.5–5.2)
Alkaline Phosphatase: 49 U/L (ref 39–117)
Bilirubin, Direct: 0.1 mg/dL (ref 0.0–0.3)
Total Bilirubin: 0.6 mg/dL (ref 0.2–1.2)
Total Protein: 7.2 g/dL (ref 6.0–8.3)

## 2018-01-10 LAB — LIPID PANEL
Cholesterol: 220 mg/dL — ABNORMAL HIGH (ref 0–200)
HDL: 42.2 mg/dL (ref >39.00)
NonHDL: 177.62
Total CHOL/HDL Ratio: 5
Triglycerides: 268 mg/dL — ABNORMAL HIGH (ref 0.0–149.0)
VLDL: 53.6 mg/dL — ABNORMAL HIGH (ref 0.0–40.0)

## 2018-01-10 LAB — BASIC METABOLIC PANEL
BUN: 13 mg/dL (ref 6–23)
CO2: 34 mEq/L — ABNORMAL HIGH (ref 19–32)
Calcium: 9.2 mg/dL (ref 8.4–10.5)
Chloride: 99 mEq/L (ref 96–112)
Creatinine, Ser: 0.99 mg/dL (ref 0.40–1.20)
GFR: 61.05 mL/min (ref >60.00)
Glucose, Bld: 137 mg/dL — ABNORMAL HIGH (ref 70–99)
Potassium: 3.1 mEq/L — ABNORMAL LOW (ref 3.5–5.1)
Sodium: 143 mEq/L (ref 135–145)

## 2018-01-10 LAB — LDL CHOLESTEROL, DIRECT: Direct LDL: 132 mg/dL

## 2018-01-10 LAB — TSH: TSH: 4.75 u[IU]/mL — ABNORMAL HIGH (ref 0.35–4.50)

## 2018-01-11 ENCOUNTER — Other Ambulatory Visit: Payer: Self-pay

## 2018-01-11 MED ORDER — LEVOTHYROXINE SODIUM 137 MCG PO CAPS: 137.0000 ug | ORAL_CAPSULE | Freq: Every day | ORAL | 2 refills | Status: DC

## 2018-01-12 ENCOUNTER — Ambulatory Visit (INDEPENDENT_AMBULATORY_CARE_PROVIDER_SITE_OTHER): Payer: Medicare Other | Admitting: Internal Medicine

## 2018-01-12 ENCOUNTER — Encounter: Payer: Self-pay | Admitting: Internal Medicine

## 2018-01-12 VITALS — BP 118/72 | HR 84 | Temp 98.4°F | Resp 18 | Wt 245.0 lb

## 2018-01-12 DIAGNOSIS — F419 Anxiety disorder, unspecified: Secondary | ICD-10-CM | POA: Diagnosis not present

## 2018-01-12 DIAGNOSIS — F329 Major depressive disorder, single episode, unspecified: Secondary | ICD-10-CM

## 2018-01-12 DIAGNOSIS — R1084 Generalized abdominal pain: Secondary | ICD-10-CM | POA: Diagnosis not present

## 2018-01-12 DIAGNOSIS — M545 Low back pain: Secondary | ICD-10-CM | POA: Diagnosis not present

## 2018-01-12 DIAGNOSIS — F32A Depression, unspecified: Secondary | ICD-10-CM

## 2018-01-12 DIAGNOSIS — E7800 Pure hypercholesterolemia, unspecified: Secondary | ICD-10-CM

## 2018-01-12 DIAGNOSIS — N644 Mastodynia: Secondary | ICD-10-CM | POA: Diagnosis not present

## 2018-01-12 DIAGNOSIS — I10 Essential (primary) hypertension: Secondary | ICD-10-CM

## 2018-01-12 DIAGNOSIS — E876 Hypokalemia: Secondary | ICD-10-CM | POA: Diagnosis not present

## 2018-01-12 DIAGNOSIS — J4520 Mild intermittent asthma, uncomplicated: Secondary | ICD-10-CM

## 2018-01-12 MED ORDER — POTASSIUM CHLORIDE ER 10 MEQ PO TBCR: 10.0000 meq | EXTENDED_RELEASE_TABLET | Freq: Two times a day (BID) | ORAL | 1 refills | Status: DC

## 2018-01-12 NOTE — Progress Notes (Signed)
Patient ID: [name], female   DOB: June 12, 1961, 57 y.o.   MRN: [id number]   Subjective:    Patient ID: [name], female    DOB: 19-Aug-1960, 57 y.o.   MRN: [id number]  HPI  Patient here for a scheduled follow up.  She is accompanied by her husband.  History obtained from both of them.  She is having increased back pain.  Followed by pain clinic.  Breathing stable.  No chest pain.  She has noticed pain in her breasts.  Both breasts.  Describes as a sharp pain.  Localized around the nipple.  No nipple discharge.  If holds up her breasts, helps.  Some abdominal discomfort.  Bowels moving.  Discussed cholesterol labs.  Discussed her thyroid dose adjustment.   Discussed need for diet adjustment.  Decreased coke intake.     Past Medical History:  Diagnosis Date  . Allergic rhinitis   . Anxiety   . Arthralgia   . Asthma   . Bronchitis, chronic ([place])   . Chronic back pain   . Collagenous colitis    followed by Dr Tiffany Kocher  . Depression   . Diplopia 11/15/2013  . Dysphagia   . Endometriosis   . Fibrocystic breast disease   . GERD (gastroesophageal reflux disease)   . History of colon polyps   . Hyperlipidemia   . Hypertension   . Hypokalemia   . IBS (irritable bowel syndrome)   . Memory difficulty 11/15/2013  . Migraine headache   . Pneumonia 2017   x2  . PONV (postoperative nausea and vomiting)    used patch  . Sleep apnea    recommended CPAP  . Ulcer disease   . Urine incontinence    Past Surgical History:  Procedure Laterality Date  . ABDOMINAL HYSTERECTOMY     endometriosis  . BACK SURGERY     x6  . BREAST BIOPSY Right   . COLONOSCOPY WITH PROPOFOL N/A 06/25/2015   Procedure: COLONOSCOPY WITH PROPOFOL;  Surgeon: Manya Silvas, MD;  Location: [facility] ENDOSCOPY;  Service: Endoscopy;  Laterality: N/A;  . DILATION AND CURETTAGE OF UTERUS     and hysteroscopy  . ESOPHAGOGASTRODUODENOSCOPY N/A 06/25/2015   Procedure: ESOPHAGOGASTRODUODENOSCOPY (EGD);  Surgeon: Manya Silvas, MD;  Location: [facility] ENDOSCOPY;  Service: Endoscopy;  Laterality: N/A;  . insertion of permanent spinal cord stimulator    . insertion of trial spinal cord stimulator    . LUMBAR DISC SURGERY    . LUMBAR FUSION  4/05 and 8/07  . LUMBAR MICRODISCECTOMY  03/28/03   L4-5 L5  . MOUTH SURGERY    . NASAL SEPTOPLASTY W/ TURBINOPLASTY    . removal of spinal cord stimulator    . SEPTOPLASTY     with reduction of turbinates  . THYROIDECTOMY N/A 06/06/2016   Procedure: THYROIDECTOMY;  Surgeon: Robert Bellow, MD;  Location: [facility] ORS;  Service: General;  Laterality: N/A;   Family History  Problem Relation Age of Onset  . Allergies Mother   . Hypertension Mother   . Arthritis Mother   . Hyperlipidemia Mother   . Stroke Mother   . Hypertension Father   . Arthritis Father   . Diabetes Father   . Hypertension Brother   . Diabetes Brother   . Arthritis Maternal Grandmother   . Hyperlipidemia Maternal Grandmother   . Hyperlipidemia Maternal Grandfather   . Arthritis Paternal Grandmother   . Arthritis Paternal Grandfather   . Asthma Unknown  several family member  . Leukemia Unknown        uncle  . Lymphoma Maternal Aunt        non hodgkins   Social History   Socioeconomic History  . Marital status: Married    Spouse name: douglas  . Number of children: 1  . Years of education: 56 th  . Highest education level: High school graduate  Occupational History  . Occupation: Disabled    Employer: DISABLED  Social Needs  . Financial resource strain: Not hard at all  . Food insecurity:    Worry: Never true    Inability: Never true  . Transportation needs:    Medical: No    Non-medical: No  Tobacco Use  . Smoking status: Never Smoker  . Smokeless tobacco: Never Used  Substance and Sexual Activity  . Alcohol use: No    Alcohol/week: 0.0 oz  . Drug use: No  . Sexual activity: Not Currently  Lifestyle  . Physical activity:    Days per week: 0 days    Minutes per  session: 0 min  . Stress: Very much  Relationships  . Social connections:    Talks on phone: Three times a week    Gets together: Never    Attends religious service: More than 4 times per year    Active member of club or organization: No    Attends meetings of clubs or organizations: Never    Relationship status: Not on file  Other Topics Concern  . Not on file  Social History Narrative  . Not on file    Outpatient Encounter Medications as of 01/12/2018  Medication Sig  . albuterol (PROVENTIL HFA;VENTOLIN HFA) 108 (90 Base) MCG/ACT inhaler Inhale 2 puffs into the lungs every 4 (four) hours as needed for wheezing or shortness of breath.  Marland Kitchen albuterol (PROVENTIL) (2.5 MG/3ML) 0.083% nebulizer solution Take 2.5 mg by nebulization every 4 (four) hours as needed for wheezing or shortness of breath.  Marland Kitchen amLODipine (NORVASC) 5 MG tablet TAKE 1 TABLET BY MOUTH EVERY DAY  . bisacodyl (DULCOLAX) 5 MG EC tablet Take by mouth.  . budesonide-formoterol (SYMBICORT) 160-4.5 MCG/ACT inhaler Inhale 2 puffs into the lungs 2 (two) times daily.  . diazepam (VALIUM) 5 MG tablet Take 5 mg by mouth 3 (three) times daily as needed.    . docusate sodium (COLACE) 100 MG capsule Take 100 mg by mouth 2 (two) times daily.  Marland Kitchen FLUoxetine (PROZAC) 20 MG tablet Take 1 tablet (20 mg total) by mouth daily.  Marland Kitchen GRALISE 600 MG TABS   . hydrochlorothiazide (HYDRODIURIL) 25 MG tablet Take 25 mg by mouth daily.  . Levothyroxine Sodium 137 MCG CAPS Take 1 capsule (137 mcg total) by mouth daily before breakfast.  . metoprolol tartrate (LOPRESSOR) 50 MG tablet TAKE 1/2 TABLET BY MOUTH TWICE A DAY (NEED OFFICE VISIT)  . mometasone (NASONEX) 50 MCG/ACT nasal spray Place into the nose.  . Multiple Vitamin (MULTIVITAMIN) capsule Take 1 capsule by mouth daily.    . Omega-3 Fatty Acids (FISH OIL) 1200 MG CAPS Take 1 capsule by mouth daily.  Dellia Nims CALCIUM + D3 500-200 MG-UNIT TABS TAKE 2 TABLETS BY MOUTH 2 (TWO) TIMES DAILY.  Marland Kitchen  Oxycodone HCl 10 MG TABS Take 10 mg by mouth 3 (three) times daily as needed.  . potassium chloride (KLOR-CON 10) 10 MEQ tablet Take 1 tablet (10 mEq total) by mouth 2 (two) times daily.  . pravastatin (PRAVACHOL) 10 MG tablet TAKE  1 TABLET (10 MG TOTAL) BY MOUTH DAILY.  Marland Kitchen predniSONE (DELTASONE) 5 MG tablet TAKE 1 TABLET BY MOUTH EVERY DAY WITH BREAKFAST  . RELISTOR 150 MG TABS TAKE 3 TABLETS BY MOUTH ONCE A DAY prn  . venlafaxine XR (EFFEXOR-XR) 150 MG 24 hr capsule Take 1 capsule (150 mg total) by mouth daily with breakfast.  . XTAMPZA ER 36 MG C12A TAKE 1 CAPSULE BY MOUTH EVERY 12 HOURS  . zolpidem (AMBIEN) 10 MG tablet Take 10 mg by mouth at bedtime as needed.    . [DISCONTINUED] KLOR-CON 10 10 MEQ tablet TAKE 1 TABLET (10 MEQ TOTAL) BY MOUTH DAILY.  . [DISCONTINUED] nystatin (NYSTATIN) powder Apply topically 2 (two) times daily.  . [DISCONTINUED] nystatin cream (MYCOSTATIN) Apply 1 application topically 2 (two) times daily.  . [DISCONTINUED] pantoprazole (PROTONIX) 40 MG tablet TAKE 1 TABLET BY MOUTH EVERY DAY  . [DISCONTINUED] triamcinolone (NASACORT AQ) 55 MCG/ACT AERO nasal inhaler Place 2 sprays into the nose 2 (two) times daily. 1 sprays each nostril twice a day  . [DISCONTINUED] triamcinolone cream (KENALOG) 0.1 % Apply 1 application topically 2 (two) times daily. Do not use more than 7-10 days in one area   No facility-administered encounter medications on file as of 01/12/2018.     Review of Systems  Constitutional: Negative for appetite change and unexpected weight change.  HENT: Negative for congestion and sinus pressure.   Respiratory: Negative for cough, chest tightness and shortness of breath.   Cardiovascular: Negative for chest pain, palpitations and leg swelling.  Gastrointestinal: Positive for abdominal pain. Negative for diarrhea and vomiting.  Genitourinary: Negative for difficulty urinating and dysuria.  Musculoskeletal: Positive for back pain. Negative for myalgias.    Skin: Negative for color change and rash.  Neurological: Negative for dizziness, light-headedness and headaches.  Psychiatric/Behavioral: Negative for agitation and dysphoric mood.       Objective:     Blood pressure rechecked by me:  118/72  Physical Exam  Constitutional: She appears well-developed and well-nourished. No distress.  HENT:  Nose: Nose normal.  Mouth/Throat: Oropharynx is clear and moist.  Neck: Neck supple. No thyromegaly present.  Cardiovascular: Normal rate and regular rhythm.  Pulmonary/Chest: Breath sounds normal. No respiratory distress. She has no wheezes.  Breasts:  No nipple discharge or nipple retraction present.  Could not appreciate any distinct nodules or axillary adenopathy.  Minimal tenderness bilateral breasts - around nipples.    Abdominal: Soft. Bowel sounds are normal. There is no tenderness.  Musculoskeletal: She exhibits no edema or tenderness.  Lymphadenopathy:    She has no cervical adenopathy.  Skin: No rash noted. No erythema.  Psychiatric: She has a normal mood and affect. Her behavior is normal.    BP 118/72   Pulse 84   Temp 98.4 F (36.9 C) (Oral)   Resp 18   Wt 245 lb (111.1 kg)   SpO2 96%   BMI 42.05 kg/m  Wt Readings from Last 3 Encounters:  01/12/18 245 lb (111.1 kg)  09/05/17 242 lb 3.2 oz (109.9 kg)  07/14/17 235 lb (106.6 kg)     Lab Results  Component Value Date   WBC 9.8 12/26/2016   HGB 14.6 12/26/2016   HCT 43.6 12/26/2016   PLT 299 12/26/2016   GLUCOSE 137 (H) 01/10/2018   CHOL 220 (H) 01/10/2018   TRIG 268.0 (H) 01/10/2018   HDL 42.20 01/10/2018   LDLDIRECT 132.0 01/10/2018   LDLCALC 121 (H) 07/12/2016   ALT 47 (H) 01/10/2018  AST 51 (H) 01/10/2018   NA 143 01/10/2018   K 3.1 (L) 01/10/2018   CL 99 01/10/2018   CREATININE 0.99 01/10/2018   BUN 13 01/10/2018   CO2 34 (H) 01/10/2018   TSH 4.75 (H) 01/10/2018    Ct Lumbar Spine Wo Contrast  Result Date: 05/24/2017 CLINICAL DATA:  Low back  pain and difficulty standing EXAM: CT LUMBAR SPINE WITHOUT CONTRAST TECHNIQUE: Multidetector CT imaging of the lumbar spine was performed without intravenous contrast administration. Multiplanar CT image reconstructions were also generated. COMPARISON:  CT lumbar spine 08/26/2014 FINDINGS: Segmentation: 5 lumbar type vertebrae. Alignment: Normal. Vertebrae: PLIF at L4-S1. No abnormal lucency surrounding the screws. There is partial osseous fusion anteriorly at the L4-L5 and L5-S1 levels. On the left, the posterior fusion mass is complete from L4-S1. Complete L5-S1 fusion on the right. Paraspinal and other soft tissues: Negative. Disc levels: The spinal canal is decompressed at the L4-L5 and L5-S1 levels. There are small disc bulges at L2-L3 and L3-L4 without advanced spinal canal or neural foraminal stenosis. IMPRESSION: 1. Unchanged appearance of L4-S1 PLIF without hardware abnormality. 2. Disc bulges at L2-L3 and L3-L4 without advanced spinal canal or neural foraminal stenosis. Electronically Signed   By: Ulyses Jarred M.D.   On: 05/24/2017 18:26       Assessment & Plan:   Problem List Items Addressed This Visit    Abdominal pain   Relevant Orders   US Abdomen Complete   Ambulatory referral to Gastroenterology   Anxiety    Followed by psychiatry.        Asthma    Followed by pulmonary.  Breathing currently stable.        Back pain    Chronic back pain.  Followed by pain clinic.        Breast pain    Breast pain as outlined.  No nipple discharge or nipple retraction present.  No palpable nodules.  Schedule diagnostic mammogram.        Relevant Orders   MM DIAG BREAST TOMO BILATERAL   US BREAST LTD UNI LEFT INC AXILLA   US BREAST LTD UNI RIGHT INC AXILLA   Depression    Followed by psychiatry.  Just added prozac.        Essential hypertension, benign    Blood pressure on recheck improved.  Continue same medication regimen.  Follow pressures.  Follow metabolic panel.         Hypercholesterolemia    On pravastatin.  Low cholesterol diet and exercise.  Follow lipid panel and liver function tests.         Other Visit Diagnoses    Hypokalemia    -  Primary   instructed on how to adjust potassium.  follow potassium.  recheck in 10 days.     Relevant Orders   Potassium       Einar Pheasant, MD

## 2018-01-14 ENCOUNTER — Encounter: Payer: Self-pay | Admitting: Internal Medicine

## 2018-01-14 DIAGNOSIS — R109 Unspecified abdominal pain: Secondary | ICD-10-CM | POA: Insufficient documentation

## 2018-01-14 DIAGNOSIS — N644 Mastodynia: Secondary | ICD-10-CM | POA: Insufficient documentation

## 2018-01-14 NOTE — Assessment & Plan Note (Signed)
Followed by pulmonary.  Breathing currently stable.

## 2018-01-14 NOTE — Assessment & Plan Note (Signed)
Chronic back pain.  Followed by pain clinic.  

## 2018-01-14 NOTE — Assessment & Plan Note (Signed)
Breast pain as outlined.  No nipple discharge or nipple retraction present.  No palpable nodules.  Schedule diagnostic mammogram.

## 2018-01-14 NOTE — Assessment & Plan Note (Signed)
On pravastatin.  Low cholesterol diet and exercise.  Follow lipid panel and liver function tests.   

## 2018-01-14 NOTE — Assessment & Plan Note (Signed)
Followed by psychiatry 

## 2018-01-14 NOTE — Assessment & Plan Note (Signed)
Followed by psychiatry.  Just added prozac.

## 2018-01-14 NOTE — Assessment & Plan Note (Signed)
Blood pressure on recheck improved.  Continue same medication regimen.  Follow pressures.  Follow metabolic panel.   

## 2018-01-19 ENCOUNTER — Ambulatory Visit
Admission: RE | Admit: 2018-01-19 | Discharge: 2018-01-19 | Disposition: A | Discharge status: Home / Self Care | Payer: Medicare Other | Source: Ambulatory Visit | Attending: Internal Medicine | Admitting: Internal Medicine

## 2018-01-19 DIAGNOSIS — R1084 Generalized abdominal pain: Secondary | ICD-10-CM

## 2018-01-19 DIAGNOSIS — R7989 Other specified abnormal findings of blood chemistry: Secondary | ICD-10-CM | POA: Diagnosis not present

## 2018-01-19 DIAGNOSIS — K760 Fatty (change of) liver, not elsewhere classified: Secondary | ICD-10-CM | POA: Diagnosis not present

## 2018-01-22 DIAGNOSIS — M47817 Spondylosis without myelopathy or radiculopathy, lumbosacral region: Secondary | ICD-10-CM | POA: Diagnosis not present

## 2018-01-22 DIAGNOSIS — G894 Chronic pain syndrome: Secondary | ICD-10-CM | POA: Diagnosis not present

## 2018-01-22 DIAGNOSIS — G4700 Insomnia, unspecified: Secondary | ICD-10-CM | POA: Diagnosis not present

## 2018-01-22 DIAGNOSIS — M62830 Muscle spasm of back: Secondary | ICD-10-CM | POA: Diagnosis not present

## 2018-01-23 ENCOUNTER — Other Ambulatory Visit (INDEPENDENT_AMBULATORY_CARE_PROVIDER_SITE_OTHER): Payer: Medicare Other

## 2018-01-23 ENCOUNTER — Ambulatory Visit
Admission: RE | Admit: 2018-01-23 | Discharge: 2018-01-23 | Disposition: A | Discharge status: Home / Self Care | Payer: Medicare Other | Source: Ambulatory Visit | Attending: Internal Medicine | Admitting: Internal Medicine

## 2018-01-23 DIAGNOSIS — N644 Mastodynia: Secondary | ICD-10-CM

## 2018-01-23 DIAGNOSIS — E876 Hypokalemia: Secondary | ICD-10-CM

## 2018-01-23 DIAGNOSIS — R928 Other abnormal and inconclusive findings on diagnostic imaging of breast: Secondary | ICD-10-CM | POA: Diagnosis not present

## 2018-01-23 NOTE — Addendum Note (Signed)
Addended by: Arby Barrette on: 01/23/2018 03:49 PM   Modules accepted: Orders

## 2018-01-24 ENCOUNTER — Other Ambulatory Visit: Payer: Self-pay | Admitting: Internal Medicine

## 2018-01-24 DIAGNOSIS — E876 Hypokalemia: Secondary | ICD-10-CM

## 2018-01-24 LAB — POTASSIUM: Potassium: 3.8 mmol/L (ref 3.5–5.3)

## 2018-01-24 NOTE — Progress Notes (Signed)
Order placed for f/u potassium.  

## 2018-01-25 ENCOUNTER — Telehealth: Payer: Self-pay | Admitting: Internal Medicine

## 2018-01-25 NOTE — Telephone Encounter (Signed)
Copied from [place] [id number]. Topic: Quick Communication - Lab Results >> Jan 24, 2018  1:21 PM Lars Masson, LPN wrote: Called patient to inform them of mammogram and lab results. When patient returns call, triage nurse may disclose results.

## 2018-01-25 NOTE — Telephone Encounter (Signed)
Returning call.

## 2018-01-25 NOTE — Telephone Encounter (Signed)
Charted in result notes. 

## 2018-02-02 ENCOUNTER — Other Ambulatory Visit: Payer: Medicare Other

## 2018-02-05 ENCOUNTER — Other Ambulatory Visit: Payer: Self-pay | Admitting: Internal Medicine

## 2018-02-05 ENCOUNTER — Other Ambulatory Visit: Payer: Self-pay | Admitting: Psychiatry

## 2018-02-05 DIAGNOSIS — F331 Major depressive disorder, recurrent, moderate: Secondary | ICD-10-CM

## 2018-02-06 ENCOUNTER — Other Ambulatory Visit: Payer: Medicare Other

## 2018-02-06 ENCOUNTER — Other Ambulatory Visit: Payer: Self-pay | Admitting: Internal Medicine

## 2018-02-08 ENCOUNTER — Ambulatory Visit: Payer: Medicare Other

## 2018-02-13 ENCOUNTER — Other Ambulatory Visit: Payer: Medicare Other

## 2018-02-13 ENCOUNTER — Other Ambulatory Visit: Payer: Self-pay | Admitting: Internal Medicine

## 2018-02-14 ENCOUNTER — Ambulatory Visit: Admission: RE | Admit: 2018-02-14 | Payer: Medicare Other | Source: Ambulatory Visit

## 2018-02-19 ENCOUNTER — Other Ambulatory Visit: Payer: Self-pay | Admitting: *Deleted

## 2018-02-19 ENCOUNTER — Other Ambulatory Visit: Payer: Self-pay | Admitting: Internal Medicine

## 2018-02-19 ENCOUNTER — Encounter: Payer: Self-pay | Admitting: Internal Medicine

## 2018-02-19 ENCOUNTER — Ambulatory Visit: Payer: Medicare Other | Admitting: Pulmonary Disease

## 2018-02-19 DIAGNOSIS — R0600 Dyspnea, unspecified: Secondary | ICD-10-CM

## 2018-02-19 DIAGNOSIS — D34 Benign neoplasm of thyroid gland: Secondary | ICD-10-CM

## 2018-02-19 NOTE — Progress Notes (Signed)
All labs ordered.

## 2018-02-21 DIAGNOSIS — G4700 Insomnia, unspecified: Secondary | ICD-10-CM | POA: Diagnosis not present

## 2018-02-21 DIAGNOSIS — M47817 Spondylosis without myelopathy or radiculopathy, lumbosacral region: Secondary | ICD-10-CM | POA: Diagnosis not present

## 2018-02-21 DIAGNOSIS — M62830 Muscle spasm of back: Secondary | ICD-10-CM | POA: Diagnosis not present

## 2018-02-21 DIAGNOSIS — G894 Chronic pain syndrome: Secondary | ICD-10-CM | POA: Diagnosis not present

## 2018-02-22 ENCOUNTER — Other Ambulatory Visit: Payer: Self-pay | Admitting: Psychiatry

## 2018-02-22 ENCOUNTER — Ambulatory Visit: Payer: Medicare Other

## 2018-02-23 ENCOUNTER — Other Ambulatory Visit: Payer: Medicare Other

## 2018-02-23 DIAGNOSIS — D34 Benign neoplasm of thyroid gland: Secondary | ICD-10-CM | POA: Diagnosis not present

## 2018-02-23 DIAGNOSIS — E876 Hypokalemia: Secondary | ICD-10-CM | POA: Diagnosis not present

## 2018-02-23 NOTE — Addendum Note (Signed)
Addended by: Arby Barrette on: 02/23/2018 09:26 AM   Modules accepted: Orders

## 2018-02-23 NOTE — Addendum Note (Signed)
Addended by: Arby Barrette on: 02/23/2018 04:08 PM   Modules accepted: Orders

## 2018-02-24 LAB — POTASSIUM: Potassium: 4.1 mmol/L (ref 3.5–5.3)

## 2018-02-24 LAB — TSH: TSH: 1.78 mIU/L (ref 0.40–4.50)

## 2018-02-25 ENCOUNTER — Other Ambulatory Visit: Payer: Self-pay | Admitting: Internal Medicine

## 2018-02-25 DIAGNOSIS — E876 Hypokalemia: Secondary | ICD-10-CM

## 2018-02-25 NOTE — Progress Notes (Signed)
Order placed for f/u potassium check.  

## 2018-02-26 ENCOUNTER — Ambulatory Visit (INDEPENDENT_AMBULATORY_CARE_PROVIDER_SITE_OTHER): Payer: Medicare Other | Admitting: Psychiatry

## 2018-02-26 ENCOUNTER — Other Ambulatory Visit: Payer: Self-pay

## 2018-02-26 ENCOUNTER — Encounter: Payer: Self-pay | Admitting: Psychiatry

## 2018-02-26 VITALS — BP 108/71 | HR 80 | Temp 98.2°F

## 2018-02-26 DIAGNOSIS — F331 Major depressive disorder, recurrent, moderate: Secondary | ICD-10-CM

## 2018-02-26 DIAGNOSIS — F411 Generalized anxiety disorder: Secondary | ICD-10-CM

## 2018-02-26 MED ORDER — VENLAFAXINE HCL ER 37.5 MG PO CP24: 37.5000 mg | ORAL_CAPSULE | Freq: Every day | ORAL | 0 refills | Status: DC

## 2018-02-26 MED ORDER — FLUOXETINE HCL 40 MG PO CAPS: 40.0000 mg | ORAL_CAPSULE | Freq: Every day | ORAL | 1 refills | Status: DC

## 2018-02-26 NOTE — Progress Notes (Signed)
BH MD/PA/NP OP Progress Note  02/26/2018 1:21 PM [name]  MRN:  [id number]  Subjective:   Patient is a 57 year old female who presented for follow up accompanied by her husband. Patient reported that she has not noticed any improvement in her symptoms since she was started on Prozac.  She reported that she continues to have pain in her back related to the bulging disc and has been using pain medications and has also received Ambien and Valium from the pain medication clinic.  Patient appeared to be somewhat sedated during the interview and she was minimizing her sedation and reported that she does not feel sleepy.  I tried to discuss with the patient about decreasing the use of Valium but she reported that she cannot stop the use of Valium as she has severe spasms in her back and in her muscles and she cannot stand and has to sit on the scooter and cannot walk.  She reported that her pain management has been giving her Valium and Ambien and she wants to continue with those medications.    She reported that the Prozac is not helping her and she feels depressed and wants to go higher on the dose of the medications.  I will gradually adjust her medications.  Her husband remains supportive.  She has increased water consumption during the daytime.      She is receptive to medication changes at this time.        Chief Complaint:   Visit Diagnosis:     ICD-10-CM   1. MDD (major depressive disorder), recurrent episode, moderate (HCC) F33.1   2. Anxiety state F41.1     Past Medical History:  Past Medical History:  Diagnosis Date  . Allergic rhinitis   . Anxiety   . Arthralgia   . Asthma   . Bronchitis, chronic ([place])   . Chronic back pain   . Collagenous colitis    followed by Dr Tiffany Kocher  . Depression   . Diplopia 11/15/2013  . Dysphagia   . Endometriosis   . Fibrocystic breast disease   . GERD (gastroesophageal reflux disease)   . History of colon polyps   . Hyperlipidemia   .  Hypertension   . Hypokalemia   . IBS (irritable bowel syndrome)   . Memory difficulty 11/15/2013  . Migraine headache   . Pneumonia 2017   x2  . PONV (postoperative nausea and vomiting)    used patch  . Sleep apnea    recommended CPAP  . Ulcer disease   . Urine incontinence     Past Surgical History:  Procedure Laterality Date  . ABDOMINAL HYSTERECTOMY     endometriosis  . BACK SURGERY     x6  . BREAST BIOPSY Right   . COLONOSCOPY WITH PROPOFOL N/A 06/25/2015   Procedure: COLONOSCOPY WITH PROPOFOL;  Surgeon: Manya Silvas, MD;  Location: [facility] ENDOSCOPY;  Service: Endoscopy;  Laterality: N/A;  . DILATION AND CURETTAGE OF UTERUS     and hysteroscopy  . ESOPHAGOGASTRODUODENOSCOPY N/A 06/25/2015   Procedure: ESOPHAGOGASTRODUODENOSCOPY (EGD);  Surgeon: Manya Silvas, MD;  Location: [facility] ENDOSCOPY;  Service: Endoscopy;  Laterality: N/A;  . insertion of permanent spinal cord stimulator    . insertion of trial spinal cord stimulator    . LUMBAR DISC SURGERY    . LUMBAR FUSION  4/05 and 8/07  . LUMBAR MICRODISCECTOMY  03/28/03   L4-5 L5  . MOUTH SURGERY    . NASAL SEPTOPLASTY W/ TURBINOPLASTY    .  removal of spinal cord stimulator    . SEPTOPLASTY     with reduction of turbinates  . THYROIDECTOMY N/A 06/06/2016   Procedure: THYROIDECTOMY;  Surgeon: Robert Bellow, MD;  Location: [facility] ORS;  Service: General;  Laterality: N/A;   Family History:  Family History  Problem Relation Age of Onset  . Allergies Mother   . Hypertension Mother   . Arthritis Mother   . Hyperlipidemia Mother   . Stroke Mother   . Hypertension Father   . Arthritis Father   . Diabetes Father   . Hypertension Brother   . Diabetes Brother   . Arthritis Maternal Grandmother   . Hyperlipidemia Maternal Grandmother   . Hyperlipidemia Maternal Grandfather   . Arthritis Paternal Grandmother   . Arthritis Paternal Grandfather   . Asthma Unknown        several family member  . Leukemia Unknown         uncle  . Lymphoma Maternal Aunt        non hodgkins   Social History:  Social History   Socioeconomic History  . Marital status: Married    Spouse name: douglas  . Number of children: 1  . Years of education: 76 th  . Highest education level: High school graduate  Occupational History  . Occupation: Disabled    Employer: DISABLED  Social Needs  . Financial resource strain: Not hard at all  . Food insecurity:    Worry: Never true    Inability: Never true  . Transportation needs:    Medical: No    Non-medical: No  Tobacco Use  . Smoking status: Never Smoker  . Smokeless tobacco: Never Used  Substance and Sexual Activity  . Alcohol use: No    Alcohol/week: 0.0 oz  . Drug use: No  . Sexual activity: Not Currently  Lifestyle  . Physical activity:    Days per week: 0 days    Minutes per session: 0 min  . Stress: Very much  Relationships  . Social connections:    Talks on phone: Three times a week    Gets together: Never    Attends religious service: More than 4 times per year    Active member of club or organization: No    Attends meetings of clubs or organizations: Never    Relationship status: Not on file  Other Topics Concern  . Not on file  Social History Narrative  . Not on file   Additional History:  Married x 37 years. Has a daughter and has 3 grandsons.  Was following Dr Mamie Nick for almost 3 years.    Assessment:   Musculoskeletal: Strength & Muscle Tone: within normal limits Gait & Station: unsteady Patient leans: N/A  Psychiatric Specialty Exam: Medication Refill     ROS  There were no vitals taken for this visit.There is no height or weight on file to calculate BMI.  General Appearance: Casual  Eye Contact:  Fair  Speech:  Clear and Coherent  Volume:  Decreased  Mood:  Anxious  Affect:  Congruent and Constricted  Thought Process:  Coherent  Orientation:  Full (Time, Place, and Person)  Thought Content:  WDL  Suicidal Thoughts:  No   Homicidal Thoughts:  No  Memory:  Immediate;   Fair  Judgement:  Fair  Insight:  Fair  Psychomotor Activity:  Decreased  Concentration:  Fair  Recall:  [employer] of Knowledge: Fair  Language: Fair  Akathisia:  No  Handed:  Right  AIMS (if indicated):    Assets:  Communication Skills Desire for Improvement Physical Health Social Support  ADL's:  Intact  Cognition: WNL  Sleep:  6-7   Is the patient at risk to self?  No. Has the patient been a risk to self in the past 6 months?  No. Has the patient been a risk to self within the distant past?  No. Is the patient a risk to others?  No. Has the patient been a risk to others in the past 6 months?  No. Has the patient been a risk to others within the distant past?  No.  Current Medications: Current Outpatient Medications  Medication Sig Dispense Refill  . albuterol (PROVENTIL HFA;VENTOLIN HFA) 108 (90 Base) MCG/ACT inhaler Inhale 2 puffs into the lungs every 4 (four) hours as needed for wheezing or shortness of breath. 1 Inhaler 3  . albuterol (PROVENTIL) (2.5 MG/3ML) 0.083% nebulizer solution Take 2.5 mg by nebulization every 4 (four) hours as needed for wheezing or shortness of breath.    Marland Kitchen amLODipine (NORVASC) 5 MG tablet TAKE 1 TABLET BY MOUTH EVERY DAY 30 tablet 3  . bisacodyl (DULCOLAX) 5 MG EC tablet Take by mouth.    . budesonide-formoterol (SYMBICORT) 160-4.5 MCG/ACT inhaler Inhale 2 puffs into the lungs 2 (two) times daily. 1 Inhaler 12  . diazepam (VALIUM) 5 MG tablet Take 5 mg by mouth 3 (three) times daily as needed.      . docusate sodium (COLACE) 100 MG capsule Take 100 mg by mouth 2 (two) times daily.    Marland Kitchen FLUoxetine (PROZAC) 20 MG tablet Take 1 tablet (20 mg total) by mouth daily. 30 tablet 1  . GRALISE 600 MG TABS     . hydrochlorothiazide (HYDRODIURIL) 25 MG tablet Take 25 mg by mouth daily.  11  . Levothyroxine Sodium 137 MCG CAPS Take 1 capsule (137 mcg total) by mouth daily before breakfast. 30 capsule 2  .  metoprolol tartrate (LOPRESSOR) 50 MG tablet TAKE 1/2 TABLET BY MOUTH TWICE A DAY (NEED OFFICE VISIT) 30 tablet 3  . mometasone (NASONEX) 50 MCG/ACT nasal spray Place into the nose.    . Multiple Vitamin (MULTIVITAMIN) capsule Take 1 capsule by mouth daily.      . Omega-3 Fatty Acids (FISH OIL) 1200 MG CAPS Take 1 capsule by mouth daily.    Dellia Nims CALCIUM + D3 500-200 MG-UNIT TABS TAKE 2 TABLETS BY MOUTH 2 (TWO) TIMES DAILY.  6  . Oxycodone HCl 10 MG TABS Take 10 mg by mouth 3 (three) times daily as needed.  0  . potassium chloride (KLOR-CON 10) 10 MEQ tablet Take 1 tablet (10 mEq total) by mouth 2 (two) times daily. 60 tablet 1  . pravastatin (PRAVACHOL) 10 MG tablet TAKE 1 TABLET (10 MG TOTAL) BY MOUTH DAILY. 45 tablet 5  . predniSONE (DELTASONE) 5 MG tablet TAKE 1 TABLET BY MOUTH EVERY DAY WITH BREAKFAST 30 tablet 0  . RELISTOR 150 MG TABS TAKE 3 TABLETS BY MOUTH ONCE A DAY prn  2  . venlafaxine XR (EFFEXOR-XR) 150 MG 24 hr capsule Take 1 capsule (150 mg total) by mouth daily with breakfast. 30 capsule 1  . XTAMPZA ER 36 MG C12A TAKE 1 CAPSULE BY MOUTH EVERY 12 HOURS  0  . zolpidem (AMBIEN) 10 MG tablet Take 10 mg by mouth at bedtime as needed.       No current facility-administered medications for this visit.      Medical  Decision Making:  Review of Psycho-Social Stressors (1)  Treatment Plan Summary:Medication management   I reviewed all the medications with the patient and her husband. She is getting Valium and Ambien from the [place] pain clinic.  Patient continues to appear sedated under the influence of these medications and is not willing to have her medications adjusted   I will change her Effexor 75 mg for 2 weeks and then 37.5 mg for 2 weeks and then stop. Start Prozac 40 mg every morning.  Patient to call the office if she notices worsening of her symptoms.  She will follow-up in 1 months or earlier depending on her symptoms.       More than 50% of the time spent  in psychoeducation, counseling and coordination of care.    This note was generated in part or whole with voice recognition software. Voice regonition is usually quite accurate but there are transcription errors that can and very often do occur. I apologize for any typographical errors that were not detected and corrected.     Rainey Pines, MD    02/26/2018, 1:21 PM

## 2018-02-28 ENCOUNTER — Ambulatory Visit: Payer: Medicare Other | Admitting: Pulmonary Disease

## 2018-03-07 ENCOUNTER — Ambulatory Visit: Payer: Medicare Other

## 2018-03-16 ENCOUNTER — Other Ambulatory Visit: Payer: Self-pay | Admitting: Internal Medicine

## 2018-03-20 ENCOUNTER — Other Ambulatory Visit: Payer: Self-pay | Admitting: Internal Medicine

## 2018-03-21 DIAGNOSIS — M62830 Muscle spasm of back: Secondary | ICD-10-CM | POA: Diagnosis not present

## 2018-03-21 DIAGNOSIS — G894 Chronic pain syndrome: Secondary | ICD-10-CM | POA: Diagnosis not present

## 2018-03-21 DIAGNOSIS — M47817 Spondylosis without myelopathy or radiculopathy, lumbosacral region: Secondary | ICD-10-CM | POA: Diagnosis not present

## 2018-03-21 DIAGNOSIS — G4700 Insomnia, unspecified: Secondary | ICD-10-CM | POA: Diagnosis not present

## 2018-03-22 ENCOUNTER — Ambulatory Visit: Payer: Medicare Other

## 2018-03-26 ENCOUNTER — Ambulatory Visit: Payer: Medicare Other | Admitting: Psychiatry

## 2018-03-28 ENCOUNTER — Encounter: Payer: Self-pay | Admitting: *Deleted

## 2018-03-29 ENCOUNTER — Encounter: Payer: Self-pay | Admitting: Internal Medicine

## 2018-03-29 ENCOUNTER — Other Ambulatory Visit: Payer: Medicare Other

## 2018-04-02 ENCOUNTER — Other Ambulatory Visit: Payer: Medicare Other

## 2018-04-05 ENCOUNTER — Ambulatory Visit: Payer: Medicare Other

## 2018-04-08 ENCOUNTER — Other Ambulatory Visit: Payer: Self-pay | Admitting: Internal Medicine

## 2018-04-12 ENCOUNTER — Ambulatory Visit: Payer: Medicare Other | Admitting: Pulmonary Disease

## 2018-04-12 ENCOUNTER — Other Ambulatory Visit: Payer: Self-pay | Admitting: Internal Medicine

## 2018-04-23 ENCOUNTER — Other Ambulatory Visit: Payer: Self-pay | Admitting: Psychiatry

## 2018-04-25 DIAGNOSIS — G4700 Insomnia, unspecified: Secondary | ICD-10-CM | POA: Diagnosis not present

## 2018-04-25 DIAGNOSIS — G894 Chronic pain syndrome: Secondary | ICD-10-CM | POA: Diagnosis not present

## 2018-04-25 DIAGNOSIS — M47817 Spondylosis without myelopathy or radiculopathy, lumbosacral region: Secondary | ICD-10-CM | POA: Diagnosis not present

## 2018-04-25 DIAGNOSIS — M62830 Muscle spasm of back: Secondary | ICD-10-CM | POA: Diagnosis not present

## 2018-04-30 ENCOUNTER — Ambulatory Visit: Payer: Medicare Other | Admitting: Psychiatry

## 2018-04-30 NOTE — Telephone Encounter (Signed)
pt called left a message checking on status of getting a one month refill on medication .

## 2018-04-30 NOTE — Telephone Encounter (Signed)
pt called states that she did have an appt today but that she has hemorrhoids and is in a lot of pain. can you please give her a month refill until she can see someone about the hemorrhoids.

## 2018-05-03 NOTE — Telephone Encounter (Signed)
pt called left message states she just needs enough medication to do until she is see on 05-28-18 with dr. Gretel Acre.

## 2018-05-04 MED ORDER — FLUOXETINE HCL 40 MG PO CAPS: 40.0000 mg | ORAL_CAPSULE | Freq: Every day | ORAL | 0 refills | Status: DC

## 2018-05-04 NOTE — Telephone Encounter (Signed)
Sent Prozac for 30 days to pharmacy

## 2018-05-18 ENCOUNTER — Other Ambulatory Visit: Payer: Self-pay | Admitting: Internal Medicine

## 2018-05-18 ENCOUNTER — Ambulatory Visit: Payer: Medicare Other | Admitting: Internal Medicine

## 2018-05-18 DIAGNOSIS — Z0289 Encounter for other administrative examinations: Secondary | ICD-10-CM

## 2018-05-18 NOTE — Telephone Encounter (Signed)
Pt cancelled f/u visit today & has cancelled the last 2-3 lab appt to recheck Potassium

## 2018-05-21 NOTE — Telephone Encounter (Signed)
I refilled potassium x 1.  She needs to schedule f/u lab before next refill.

## 2018-05-23 DIAGNOSIS — G4700 Insomnia, unspecified: Secondary | ICD-10-CM | POA: Diagnosis not present

## 2018-05-23 DIAGNOSIS — G894 Chronic pain syndrome: Secondary | ICD-10-CM | POA: Diagnosis not present

## 2018-05-23 DIAGNOSIS — M62830 Muscle spasm of back: Secondary | ICD-10-CM | POA: Diagnosis not present

## 2018-05-23 DIAGNOSIS — M47817 Spondylosis without myelopathy or radiculopathy, lumbosacral region: Secondary | ICD-10-CM | POA: Diagnosis not present

## 2018-05-24 ENCOUNTER — Encounter: Payer: Self-pay | Admitting: *Deleted

## 2018-05-24 NOTE — Telephone Encounter (Signed)
Sent mychart message

## 2018-05-27 ENCOUNTER — Other Ambulatory Visit: Payer: Self-pay | Admitting: Internal Medicine

## 2018-05-27 ENCOUNTER — Other Ambulatory Visit: Payer: Self-pay | Admitting: Psychiatry

## 2018-05-28 ENCOUNTER — Ambulatory Visit (INDEPENDENT_AMBULATORY_CARE_PROVIDER_SITE_OTHER): Payer: Medicare Other | Admitting: Psychiatry

## 2018-05-28 ENCOUNTER — Other Ambulatory Visit: Payer: Self-pay

## 2018-05-28 ENCOUNTER — Encounter: Payer: Self-pay | Admitting: Psychiatry

## 2018-05-28 VITALS — BP 125/76 | HR 80 | Temp 97.2°F | Wt 227.6 lb

## 2018-05-28 DIAGNOSIS — F411 Generalized anxiety disorder: Secondary | ICD-10-CM | POA: Diagnosis not present

## 2018-05-28 DIAGNOSIS — F331 Major depressive disorder, recurrent, moderate: Secondary | ICD-10-CM | POA: Diagnosis not present

## 2018-05-28 MED ORDER — ESCITALOPRAM OXALATE 10 MG PO TABS: 10.0000 mg | ORAL_TABLET | Freq: Every day | ORAL | 1 refills | Status: DC

## 2018-05-28 NOTE — Progress Notes (Signed)
BH MD/PA/NP OP Progress Note  05/28/2018 2:43 PM [name]  MRN:  [id number]  Subjective:   Patient is a 57 year old female who presented for follow up accompanied by her husband.  She is currently in the wheelchair.  Patient reported that she has noticed increase in her headaches since she was started on the Prozac.  She spends most of the time in her bedroom.  She reported that she does not have any energy to meet people and talks with them.  She has been getting Ambien and the pain medications from her pain clinic.  She is also on Valium 3 times daily.  She does not want to have her medications decreased.  She wants to have the Prozac changed as she thinks that the headaches have been worse since because of the medication.  Patient reported that she is interested in taking and other anxiety and depressive medication at this time.  Her husband remains supportive.    Patient is denying suicidal homicidal ideations or plans.  She is receptive to medication changes at this time.    Her appetite and sleep are normal.  She stated that she has chronic pain and does not want to have her pain medication changes       Chief Complaint:   Visit Diagnosis:     ICD-10-CM   1. MDD (major depressive disorder), recurrent episode, moderate (HCC) F33.1   2. Anxiety state F41.1     Past Medical History:  Past Medical History:  Diagnosis Date  . Allergic rhinitis   . Anxiety   . Arthralgia   . Asthma   . Bronchitis, chronic ([place])   . Chronic back pain   . Collagenous colitis    followed by Dr Tiffany Kocher  . Depression   . Diplopia 11/15/2013  . Dysphagia   . Endometriosis   . Fibrocystic breast disease   . GERD (gastroesophageal reflux disease)   . History of colon polyps   . Hyperlipidemia   . Hypertension   . Hypokalemia   . IBS (irritable bowel syndrome)   . Memory difficulty 11/15/2013  . Migraine headache   . Pneumonia 2017   x2  . PONV (postoperative nausea and vomiting)    used  patch  . Sleep apnea    recommended CPAP  . Ulcer disease   . Urine incontinence     Past Surgical History:  Procedure Laterality Date  . ABDOMINAL HYSTERECTOMY     endometriosis  . BACK SURGERY     x6  . BREAST BIOPSY Right   . COLONOSCOPY WITH PROPOFOL N/A 06/25/2015   Procedure: COLONOSCOPY WITH PROPOFOL;  Surgeon: Manya Silvas, MD;  Location: [facility] ENDOSCOPY;  Service: Endoscopy;  Laterality: N/A;  . DILATION AND CURETTAGE OF UTERUS     and hysteroscopy  . ESOPHAGOGASTRODUODENOSCOPY N/A 06/25/2015   Procedure: ESOPHAGOGASTRODUODENOSCOPY (EGD);  Surgeon: Manya Silvas, MD;  Location: [facility] ENDOSCOPY;  Service: Endoscopy;  Laterality: N/A;  . insertion of permanent spinal cord stimulator    . insertion of trial spinal cord stimulator    . LUMBAR DISC SURGERY    . LUMBAR FUSION  4/05 and 8/07  . LUMBAR MICRODISCECTOMY  03/28/03   L4-5 L5  . MOUTH SURGERY    . NASAL SEPTOPLASTY W/ TURBINOPLASTY    . removal of spinal cord stimulator    . SEPTOPLASTY     with reduction of turbinates  . THYROIDECTOMY N/A 06/06/2016   Procedure: THYROIDECTOMY;  Surgeon: Robert Bellow,  MD;  Location: [facility] ORS;  Service: General;  Laterality: N/A;   Family History:  Family History  Problem Relation Age of Onset  . Allergies Mother   . Hypertension Mother   . Arthritis Mother   . Hyperlipidemia Mother   . Stroke Mother   . Hypertension Father   . Arthritis Father   . Diabetes Father   . Hypertension Brother   . Diabetes Brother   . Arthritis Maternal Grandmother   . Hyperlipidemia Maternal Grandmother   . Hyperlipidemia Maternal Grandfather   . Arthritis Paternal Grandmother   . Arthritis Paternal Grandfather   . Asthma Unknown        several family member  . Leukemia Unknown        uncle  . Lymphoma Maternal Aunt        non hodgkins   Social History:  Social History   Socioeconomic History  . Marital status: Married    Spouse name: douglas  . Number of children: 1   . Years of education: 65 th  . Highest education level: High school graduate  Occupational History  . Occupation: Disabled    Employer: DISABLED  Social Needs  . Financial resource strain: Not hard at all  . Food insecurity:    Worry: Never true    Inability: Never true  . Transportation needs:    Medical: No    Non-medical: No  Tobacco Use  . Smoking status: Never Smoker  . Smokeless tobacco: Never Used  Substance and Sexual Activity  . Alcohol use: No    Alcohol/week: 0.0 standard drinks  . Drug use: No  . Sexual activity: Not Currently  Lifestyle  . Physical activity:    Days per week: 0 days    Minutes per session: 0 min  . Stress: Very much  Relationships  . Social connections:    Talks on phone: Three times a week    Gets together: Never    Attends religious service: More than 4 times per year    Active member of club or organization: No    Attends meetings of clubs or organizations: Never    Relationship status: Not on file  Other Topics Concern  . Not on file  Social History Narrative  . Not on file   Additional History:  Married x 37 years. Has a daughter and has 3 grandsons.  Was following Dr Mamie Nick for almost 3 years.    Assessment:   Musculoskeletal: Strength & Muscle Tone: within normal limits Gait & Station: unsteady Patient leans: N/A  Psychiatric Specialty Exam: Medication Refill  Associated symptoms include headaches and weakness.    Review of Systems  Constitutional: Positive for malaise/fatigue.  Eyes: Positive for blurred vision.  Neurological: Positive for weakness and headaches.  Psychiatric/Behavioral: Positive for depression.    There were no vitals taken for this visit.There is no height or weight on file to calculate BMI.  General Appearance: Casual  Eye Contact:  Fair  Speech:  Clear and Coherent  Volume:  Decreased  Mood:  Anxious  Affect:  Congruent and Constricted  Thought Process:  Coherent  Orientation:  Full (Time,  Place, and Person)  Thought Content:  WDL  Suicidal Thoughts:  No  Homicidal Thoughts:  No  Memory:  Immediate;   Fair  Judgement:  Fair  Insight:  Fair  Psychomotor Activity:  Decreased  Concentration:  Fair  Recall:  [employer] of Knowledge: Fair  Language: Fair  Akathisia:  No  Handed:  Right  AIMS (if indicated):    Assets:  Communication Skills Desire for Improvement Physical Health Social Support  ADL's:  Intact  Cognition: WNL  Sleep:  6-7   Is the patient at risk to self?  No. Has the patient been a risk to self in the past 6 months?  No. Has the patient been a risk to self within the distant past?  No. Is the patient a risk to others?  No. Has the patient been a risk to others in the past 6 months?  No. Has the patient been a risk to others within the distant past?  No.  Current Medications: Current Outpatient Medications  Medication Sig Dispense Refill  . albuterol (PROVENTIL HFA;VENTOLIN HFA) 108 (90 Base) MCG/ACT inhaler Inhale 2 puffs into the lungs every 4 (four) hours as needed for wheezing or shortness of breath. 1 Inhaler 3  . albuterol (PROVENTIL) (2.5 MG/3ML) 0.083% nebulizer solution Take 2.5 mg by nebulization every 4 (four) hours as needed for wheezing or shortness of breath.    Marland Kitchen amLODipine (NORVASC) 5 MG tablet TAKE 1 TABLET BY MOUTH EVERY DAY 30 tablet 3  . bisacodyl (DULCOLAX) 5 MG EC tablet Take by mouth.    . budesonide-formoterol (SYMBICORT) 160-4.5 MCG/ACT inhaler Inhale 2 puffs into the lungs 2 (two) times daily. 1 Inhaler 12  . diazepam (VALIUM) 5 MG tablet Take 5 mg by mouth 3 (three) times daily as needed.      . docusate sodium (COLACE) 100 MG capsule Take 100 mg by mouth 2 (two) times daily.    Marland Kitchen FLUoxetine (PROZAC) 40 MG capsule Take 1 capsule (40 mg total) by mouth daily. 30 capsule 0  . GRALISE 600 MG TABS     . hydrochlorothiazide (HYDRODIURIL) 25 MG tablet Take 25 mg by mouth daily.  11  . metoprolol tartrate (LOPRESSOR) 50 MG tablet  TAKE 1/2 TABLET BY MOUTH TWICE A DAY (NEED OFFICE VISIT) 30 tablet 3  . mometasone (NASONEX) 50 MCG/ACT nasal spray Place into the nose.    . Multiple Vitamin (MULTIVITAMIN) capsule Take 1 capsule by mouth daily.      . Omega-3 Fatty Acids (FISH OIL) 1200 MG CAPS Take 1 capsule by mouth daily.    Dellia Nims CALCIUM + D3 500-200 MG-UNIT TABS TAKE 2 TABLETS BY MOUTH 2 (TWO) TIMES DAILY.  6  . Oxycodone HCl 10 MG TABS Take 10 mg by mouth 3 (three) times daily as needed.  0  . potassium chloride (K-DUR) 10 MEQ tablet TAKE 1 TABLET BY MOUTH TWICE A DAY 60 tablet 0  . pravastatin (PRAVACHOL) 10 MG tablet TAKE 1 TABLET (10 MG TOTAL) BY MOUTH DAILY. 45 tablet 5  . predniSONE (DELTASONE) 5 MG tablet TAKE 1 TABLET BY MOUTH EVERY DAY WITH BREAKFAST 30 tablet 0  . RELISTOR 150 MG TABS TAKE 3 TABLETS BY MOUTH ONCE A DAY prn  2  . TIROSINT 137 MCG CAPS TAKE 1 CAPSULE BY MOUTH DAILY BEFORE BREAKFAST. 30 capsule 2  . venlafaxine XR (EFFEXOR-XR) 37.5 MG 24 hr capsule Take 1 capsule (37.5 mg total) by mouth daily with breakfast. Take 2 caps in Am x 2 weeks then 1 cap in AM x 2 week then STOP 60 capsule 0  . XTAMPZA ER 36 MG C12A TAKE 1 CAPSULE BY MOUTH EVERY 12 HOURS  0  . zolpidem (AMBIEN) 10 MG tablet Take 10 mg by mouth at bedtime as needed.       No current  facility-administered medications for this visit.      Medical Decision Making:  Review of Psycho-Social Stressors (1)  Treatment Plan Summary:Medication management   I reviewed all the medications with the patient and her husband. She is getting Valium and Ambien from the [place] [facility].  Patient continues to appear sedated under the influence of these medications and is not willing to have her medications adjusted   Stop Prozac  Start her on Lexapro 10 mg p.o. daily.  .  Patient to call the office if she notices worsening of her symptoms.  She will follow-up in 2 months or earlier depending on her symptoms.       More than 50% of the  time spent in psychoeducation, counseling and coordination of care.    This note was generated in part or whole with voice recognition software. Voice regonition is usually quite accurate but there are transcription errors that can and very often do occur. I apologize for any typographical errors that were not detected and corrected.     Rainey Pines, MD    05/28/2018, 2:43 PM

## 2018-06-04 ENCOUNTER — Encounter: Payer: Self-pay | Admitting: Internal Medicine

## 2018-06-04 DIAGNOSIS — E7800 Pure hypercholesterolemia, unspecified: Secondary | ICD-10-CM

## 2018-06-04 DIAGNOSIS — I10 Essential (primary) hypertension: Secondary | ICD-10-CM

## 2018-06-04 NOTE — Telephone Encounter (Signed)
Last TSH was checked in 02/2018. Does she need both drawn?

## 2018-06-04 NOTE — Telephone Encounter (Signed)
Orders placed for f/u labs.  See her my chart message.  Please schedule her for fasting lab appt.

## 2018-06-06 NOTE — Telephone Encounter (Signed)
Left detailed message letting patient know to call and schedule fasting labs

## 2018-06-12 ENCOUNTER — Other Ambulatory Visit: Payer: Self-pay | Admitting: Internal Medicine

## 2018-06-20 DIAGNOSIS — M47817 Spondylosis without myelopathy or radiculopathy, lumbosacral region: Secondary | ICD-10-CM | POA: Diagnosis not present

## 2018-06-20 DIAGNOSIS — G894 Chronic pain syndrome: Secondary | ICD-10-CM | POA: Diagnosis not present

## 2018-06-20 DIAGNOSIS — J0140 Acute pansinusitis, unspecified: Secondary | ICD-10-CM | POA: Diagnosis not present

## 2018-06-20 DIAGNOSIS — Z79891 Long term (current) use of opiate analgesic: Secondary | ICD-10-CM | POA: Diagnosis not present

## 2018-06-20 DIAGNOSIS — G4700 Insomnia, unspecified: Secondary | ICD-10-CM | POA: Diagnosis not present

## 2018-06-20 DIAGNOSIS — N390 Urinary tract infection, site not specified: Secondary | ICD-10-CM | POA: Diagnosis not present

## 2018-06-20 DIAGNOSIS — R300 Dysuria: Secondary | ICD-10-CM | POA: Diagnosis not present

## 2018-06-20 DIAGNOSIS — M62830 Muscle spasm of back: Secondary | ICD-10-CM | POA: Diagnosis not present

## 2018-06-21 ENCOUNTER — Other Ambulatory Visit: Payer: Self-pay | Admitting: Psychiatry

## 2018-07-02 ENCOUNTER — Ambulatory Visit: Payer: Medicare Other | Admitting: Psychiatry

## 2018-07-07 ENCOUNTER — Other Ambulatory Visit: Payer: Self-pay | Admitting: Internal Medicine

## 2018-07-12 ENCOUNTER — Other Ambulatory Visit: Payer: Self-pay | Admitting: Internal Medicine

## 2018-07-20 ENCOUNTER — Other Ambulatory Visit: Payer: Self-pay | Admitting: Internal Medicine

## 2018-07-20 ENCOUNTER — Other Ambulatory Visit: Payer: Self-pay | Admitting: Psychiatry

## 2018-07-20 ENCOUNTER — Encounter: Payer: Self-pay | Admitting: *Deleted

## 2018-07-20 NOTE — Telephone Encounter (Signed)
NO OV since 01/12/18 ok to fill?

## 2018-07-20 NOTE — Telephone Encounter (Signed)
I refilled her metoprolol x 1.  Needs to schedule a f/u appt.

## 2018-07-23 ENCOUNTER — Encounter: Payer: Self-pay | Admitting: Internal Medicine

## 2018-07-23 ENCOUNTER — Ambulatory Visit (INDEPENDENT_AMBULATORY_CARE_PROVIDER_SITE_OTHER): Payer: Medicare Other | Admitting: Psychiatry

## 2018-07-23 ENCOUNTER — Encounter: Payer: Self-pay | Admitting: Emergency Medicine

## 2018-07-23 ENCOUNTER — Encounter: Payer: Self-pay | Admitting: Psychiatry

## 2018-07-23 ENCOUNTER — Other Ambulatory Visit: Payer: Self-pay

## 2018-07-23 ENCOUNTER — Ambulatory Visit (INDEPENDENT_AMBULATORY_CARE_PROVIDER_SITE_OTHER): Payer: Medicare Other | Admitting: Internal Medicine

## 2018-07-23 ENCOUNTER — Emergency Department
Admission: EM | Admit: 2018-07-23 | Discharge: 2018-07-24 | Disposition: A | Discharge status: Home / Self Care | Payer: Medicare Other | Attending: Emergency Medicine | Admitting: Emergency Medicine

## 2018-07-23 DIAGNOSIS — I10 Essential (primary) hypertension: Secondary | ICD-10-CM | POA: Insufficient documentation

## 2018-07-23 DIAGNOSIS — R4589 Other symptoms and signs involving emotional state: Secondary | ICD-10-CM

## 2018-07-23 DIAGNOSIS — F32A Depression, unspecified: Secondary | ICD-10-CM

## 2018-07-23 DIAGNOSIS — F329 Major depressive disorder, single episode, unspecified: Secondary | ICD-10-CM

## 2018-07-23 DIAGNOSIS — F331 Major depressive disorder, recurrent, moderate: Secondary | ICD-10-CM | POA: Diagnosis not present

## 2018-07-23 DIAGNOSIS — M545 Low back pain, unspecified: Secondary | ICD-10-CM

## 2018-07-23 DIAGNOSIS — J45909 Unspecified asthma, uncomplicated: Secondary | ICD-10-CM | POA: Diagnosis not present

## 2018-07-23 DIAGNOSIS — F411 Generalized anxiety disorder: Secondary | ICD-10-CM | POA: Diagnosis not present

## 2018-07-23 DIAGNOSIS — M62830 Muscle spasm of back: Secondary | ICD-10-CM | POA: Diagnosis not present

## 2018-07-23 DIAGNOSIS — G894 Chronic pain syndrome: Secondary | ICD-10-CM | POA: Diagnosis not present

## 2018-07-23 DIAGNOSIS — Z79899 Other long term (current) drug therapy: Secondary | ICD-10-CM | POA: Insufficient documentation

## 2018-07-23 DIAGNOSIS — M47817 Spondylosis without myelopathy or radiculopathy, lumbosacral region: Secondary | ICD-10-CM | POA: Diagnosis not present

## 2018-07-23 DIAGNOSIS — G4700 Insomnia, unspecified: Secondary | ICD-10-CM | POA: Diagnosis not present

## 2018-07-23 LAB — CBC
HCT: 43.1 % (ref 36.0–46.0)
Hemoglobin: 14.7 g/dL (ref 12.0–15.0)
MCH: 30.8 pg (ref 26.0–34.0)
MCHC: 34.1 g/dL (ref 30.0–36.0)
MCV: 90.2 fL (ref 80.0–100.0)
Platelets: 349 10*3/uL (ref 150–400)
RBC: 4.78 MIL/uL (ref 3.87–5.11)
RDW: 12 % (ref 11.5–15.5)
WBC: 6.8 10*3/uL (ref 4.0–10.5)
nRBC: 0 % (ref 0.0–0.2)

## 2018-07-23 LAB — URINE DRUG SCREEN, QUALITATIVE (ARMC ONLY)
Amphetamines, Ur Screen: NOT DETECTED
Barbiturates, Ur Screen: NOT DETECTED
Benzodiazepine, Ur Scrn: POSITIVE — AB
Cannabinoid 50 Ng, Ur ~~LOC~~: NOT DETECTED
Cocaine Metabolite,Ur ~~LOC~~: NOT DETECTED
MDMA (Ecstasy)Ur Screen: NOT DETECTED
Methadone Scn, Ur: NOT DETECTED
Opiate, Ur Screen: POSITIVE — AB
Phencyclidine (PCP) Ur S: NOT DETECTED
Tricyclic, Ur Screen: POSITIVE — AB

## 2018-07-23 LAB — COMPREHENSIVE METABOLIC PANEL
ALT: 19 U/L (ref 0–44)
AST: 25 U/L (ref 15–41)
Albumin: 4.2 g/dL (ref 3.5–5.0)
Alkaline Phosphatase: 52 U/L (ref 38–126)
Anion gap: 8 (ref 5–15)
BUN: 12 mg/dL (ref 6–20)
CO2: 30 mmol/L (ref 22–32)
Calcium: 8.6 mg/dL — ABNORMAL LOW (ref 8.9–10.3)
Chloride: 100 mmol/L (ref 98–111)
Creatinine, Ser: 0.82 mg/dL (ref 0.44–1.00)
GFR calc Af Amer: >60 mL/min (ref >60)
GFR calc non Af Amer: >60 mL/min (ref >60)
Glucose, Bld: 102 mg/dL — ABNORMAL HIGH (ref 70–99)
Potassium: 2.9 mmol/L — ABNORMAL LOW (ref 3.5–5.1)
Sodium: 138 mmol/L (ref 135–145)
Total Bilirubin: 0.6 mg/dL (ref 0.3–1.2)
Total Protein: 7.1 g/dL (ref 6.5–8.1)

## 2018-07-23 LAB — ETHANOL: Alcohol, Ethyl (B): <10 mg/dL (ref <10)

## 2018-07-23 LAB — SALICYLATE LEVEL: Salicylate Lvl: <7 mg/dL (ref 2.8–30.0)

## 2018-07-23 LAB — ACETAMINOPHEN LEVEL: Acetaminophen (Tylenol), Serum: <10 ug/mL — ABNORMAL LOW (ref 10–30)

## 2018-07-23 MED ORDER — VENLAFAXINE HCL ER 150 MG PO CP24: 150.0000 mg | ORAL_CAPSULE | Freq: Every day | ORAL | 1 refills | Status: DC

## 2018-07-23 MED ORDER — POTASSIUM CHLORIDE CRYS ER 20 MEQ PO TBCR
20.0000 meq | EXTENDED_RELEASE_TABLET | Freq: Once | ORAL | Status: AC
  Administered 2018-07-23: 20 meq via ORAL
  Filled 2018-07-23: qty 1

## 2018-07-23 NOTE — Assessment & Plan Note (Signed)
Has a history of depression.  Worsened recently.  Sees psychiatry.  Walked in to clinic today stating she did not feel good and wanted to die.  Had lengthy discussion with her and her husband.  After discussion, it was determined that the best way to get her treatment would be evaluation at [facility] this pm. Expressed my concern for her safety.   This was discussed at length with her and her husband.  She agreed to go for evaluation voluntarily.  Discussed with ER triage and behavioral intake nurse.  Police called to transport pt.  Pt left office with police to be transported to ER for evaluation.

## 2018-07-23 NOTE — Progress Notes (Signed)
Patient ID: [name], female   DOB: 01/26/1961, 57 y.o.   MRN: [id number]   Subjective:    Patient ID: [name], female    DOB: 1960-10-27, 57 y.o.   MRN: [id number]  HPI  Patient here as a work in with concerns regarding increased depression.  She walked in and was evaluated by our triage nurse.  Stated she "just wanted to die".  Has had increased problems with depression - worse over the last two months.  States her medication has been adjusted.  Was evaluated today.  Advised to stop lexapro and start back on effexor.  Has been on this previously and felt it helped (on higher dose of effexor).  She has chronic pain.  Was seen in pain clinic today.  Has pain medications, ambien, valium at home.  States she previously (in the past), had her medications lined up and thought about taking them.  States she would not want to hurt her family, but reports she wants to die.  States she is tired of being in pain and not feeling well.  Not sleeping well.     Past Medical History:  Diagnosis Date  . Allergic rhinitis   . Anxiety   . Arthralgia   . Asthma   . Bronchitis, chronic ([facility])   . Chronic back pain   . Collagenous colitis    followed by Dr Tiffany Kocher  . Depression   . Diplopia 11/15/2013  . Dysphagia   . Endometriosis   . Fibrocystic breast disease   . GERD (gastroesophageal reflux disease)   . History of colon polyps   . Hyperlipidemia   . Hypertension   . Hypokalemia   . IBS (irritable bowel syndrome)   . Memory difficulty 11/15/2013  . Migraine headache   . Pneumonia 2017   x2  . PONV (postoperative nausea and vomiting)    used patch  . Sleep apnea    recommended CPAP  . Ulcer disease   . Urine incontinence    Past Surgical History:  Procedure Laterality Date  . ABDOMINAL HYSTERECTOMY     endometriosis  . BACK SURGERY     x6  . BREAST BIOPSY Right   . COLONOSCOPY WITH PROPOFOL N/A 06/25/2015   Procedure: COLONOSCOPY WITH PROPOFOL;  Surgeon: Manya Silvas, MD;   Location: [facility] ENDOSCOPY;  Service: Endoscopy;  Laterality: N/A;  . DILATION AND CURETTAGE OF UTERUS     and hysteroscopy  . ESOPHAGOGASTRODUODENOSCOPY N/A 06/25/2015   Procedure: ESOPHAGOGASTRODUODENOSCOPY (EGD);  Surgeon: Manya Silvas, MD;  Location: [facility] ENDOSCOPY;  Service: Endoscopy;  Laterality: N/A;  . insertion of permanent spinal cord stimulator    . insertion of trial spinal cord stimulator    . LUMBAR DISC SURGERY    . LUMBAR FUSION  4/05 and 8/07  . LUMBAR MICRODISCECTOMY  03/28/03   L4-5 L5  . MOUTH SURGERY    . NASAL SEPTOPLASTY W/ TURBINOPLASTY    . removal of spinal cord stimulator    . SEPTOPLASTY     with reduction of turbinates  . THYROIDECTOMY N/A 06/06/2016   Procedure: THYROIDECTOMY;  Surgeon: Robert Bellow, MD;  Location: [facility] ORS;  Service: General;  Laterality: N/A;   Family History  Problem Relation Age of Onset  . Allergies Mother   . Hypertension Mother   . Arthritis Mother   . Hyperlipidemia Mother   . Stroke Mother   . Hypertension Father   . Arthritis Father   . Diabetes Father   .  Hypertension Brother   . Diabetes Brother   . Arthritis Maternal Grandmother   . Hyperlipidemia Maternal Grandmother   . Hyperlipidemia Maternal Grandfather   . Arthritis Paternal Grandmother   . Arthritis Paternal Grandfather   . Asthma Unknown        several family member  . Leukemia Unknown        uncle  . Lymphoma Maternal Aunt        non hodgkins   Social History   Socioeconomic History  . Marital status: Married    Spouse name: douglas  . Number of children: 1  . Years of education: 47 th  . Highest education level: High school graduate  Occupational History  . Occupation: Disabled    Employer: DISABLED  Social Needs  . Financial resource strain: Not hard at all  . Food insecurity:    Worry: Never true    Inability: Never true  . Transportation needs:    Medical: No    Non-medical: No  Tobacco Use  . Smoking status: Never Smoker  .  Smokeless tobacco: Never Used  Substance and Sexual Activity  . Alcohol use: No    Alcohol/week: 0.0 standard drinks  . Drug use: No  . Sexual activity: Not Currently  Lifestyle  . Physical activity:    Days per week: 0 days    Minutes per session: 0 min  . Stress: Very much  Relationships  . Social connections:    Talks on phone: Three times a week    Gets together: Never    Attends religious service: More than 4 times per year    Active member of club or organization: No    Attends meetings of clubs or organizations: Never    Relationship status: Not on file  Other Topics Concern  . Not on file  Social History Narrative  . Not on file    Outpatient Encounter Medications as of 07/23/2018  Medication Sig  . albuterol (PROVENTIL HFA;VENTOLIN HFA) 108 (90 Base) MCG/ACT inhaler Inhale 2 puffs into the lungs every 4 (four) hours as needed for wheezing or shortness of breath.  Marland Kitchen albuterol (PROVENTIL) (2.5 MG/3ML) 0.083% nebulizer solution Take 2.5 mg by nebulization every 4 (four) hours as needed for wheezing or shortness of breath.  Marland Kitchen amLODipine (NORVASC) 5 MG tablet TAKE 1 TABLET BY MOUTH EVERY DAY  . bisacodyl (DULCOLAX) 5 MG EC tablet Take by mouth.  . budesonide-formoterol (SYMBICORT) 160-4.5 MCG/ACT inhaler Inhale 2 puffs into the lungs 2 (two) times daily.  . diazepam (VALIUM) 5 MG tablet Take 5 mg by mouth 3 (three) times daily as needed.    . docusate sodium (COLACE) 100 MG capsule Take 100 mg by mouth 2 (two) times daily.  Marland Kitchen GRALISE 600 MG TABS   . hydrochlorothiazide (HYDRODIURIL) 25 MG tablet Take 25 mg by mouth daily.  . metoprolol tartrate (LOPRESSOR) 50 MG tablet TAKE 1/2 TABLET BY MOUTH TWICE A DAY (NEED OFFICE VISIT)  . mometasone (NASONEX) 50 MCG/ACT nasal spray Place into the nose.  . Multiple Vitamin (MULTIVITAMIN) capsule Take 1 capsule by mouth daily.    . Omega-3 Fatty Acids (FISH OIL) 1200 MG CAPS Take 1 capsule by mouth daily.  Dellia Nims CALCIUM + D3 500-200  MG-UNIT TABS TAKE 2 TABLETS BY MOUTH 2 (TWO) TIMES DAILY.  Marland Kitchen Oxycodone HCl 10 MG TABS Take 10 mg by mouth 3 (three) times daily as needed.  . potassium chloride (K-DUR) 10 MEQ tablet TAKE 1 TABLET BY MOUTH  TWICE A DAY  . pravastatin (PRAVACHOL) 10 MG tablet TAKE 1 TABLET (10 MG TOTAL) BY MOUTH DAILY.  Marland Kitchen RELISTOR 150 MG TABS TAKE 3 TABLETS BY MOUTH ONCE A DAY prn  . TIROSINT 137 MCG CAPS TAKE 1 CAPSULE BY MOUTH DAILY BEFORE BREAKFAST.  Marland Kitchen venlafaxine XR (EFFEXOR-XR) 150 MG 24 hr capsule Take 1 capsule (150 mg total) by mouth daily with breakfast.  . XTAMPZA ER 36 MG C12A TAKE 1 CAPSULE BY MOUTH EVERY 12 HOURS  . zolpidem (AMBIEN) 10 MG tablet Take 10 mg by mouth at bedtime as needed.    . [DISCONTINUED] triamcinolone (NASACORT AQ) 55 MCG/ACT AERO nasal inhaler Place 2 sprays into the nose 2 (two) times daily. 1 sprays each nostril twice a day   No facility-administered encounter medications on file as of 07/23/2018.     Review of Systems  Constitutional: Positive for fatigue.       Some decreased appetite at times.    HENT: Negative for congestion and sinus pressure.   Respiratory: Negative for cough and chest tightness.        Breathing stable.    Cardiovascular: Negative for chest pain and leg swelling.  Gastrointestinal: Negative for nausea and vomiting.  Genitourinary: Negative for difficulty urinating and dysuria.  Musculoskeletal:       Chronic back pain.    Skin: Negative for color change and rash.  Neurological: Negative for dizziness and headaches.  Psychiatric/Behavioral: Positive for dysphoric mood. Negative for agitation.       Increased depression as outlined.         Objective:    Physical Exam  Constitutional: She appears well-developed and well-nourished. No distress.  Neck: Neck supple.  Cardiovascular: Normal rate and regular rhythm.  Pulmonary/Chest: Breath sounds normal. No respiratory distress. She has no wheezes.  Abdominal: Soft. Bowel sounds are normal.  There is no tenderness.  Psychiatric:  Appears sleepy.  Depressed.      BP 138/84 (BP Location: Left Arm)   Pulse 82   Temp 98.6 F (37 C) (Oral)   Resp 16   Wt 226 lb (102.5 kg)   SpO2 94%   BMI 38.79 kg/m  Wt Readings from Last 3 Encounters:  07/23/18 226 lb (102.5 kg)  05/28/18 227 lb 9.6 oz (103.2 kg)  01/12/18 245 lb (111.1 kg)     Lab Results  Component Value Date   WBC 9.8 12/26/2016   HGB 14.6 12/26/2016   HCT 43.6 12/26/2016   PLT 299 12/26/2016   GLUCOSE 137 (H) 01/10/2018   CHOL 220 (H) 01/10/2018   TRIG 268.0 (H) 01/10/2018   HDL 42.20 01/10/2018   LDLDIRECT 132.0 01/10/2018   LDLCALC 121 (H) 07/12/2016   ALT 47 (H) 01/10/2018   AST 51 (H) 01/10/2018   NA 143 01/10/2018   K 4.1 02/23/2018   CL 99 01/10/2018   CREATININE 0.99 01/10/2018   BUN 13 01/10/2018   CO2 34 (H) 01/10/2018   TSH 1.78 02/23/2018    US Breast Ltd Uni Left Inc Axilla  Result Date: 01/23/2018 CLINICAL DATA:  57 year old female with intermittent, shooting pain in the bilateral subareolar regions for approximately 1 year. EXAM: DIGITAL DIAGNOSTIC BILATERAL MAMMOGRAM WITH CAD AND TOMO ULTRASOUND BILATERAL BREAST COMPARISON:  Previous exam(s). ACR Breast Density Category b: There are scattered areas of fibroglandular density. FINDINGS: Stable circumscribed masses are identified bilaterally. No suspicious mammographic findings are identified in either breast. Mammographic images were processed with CAD. Targeted ultrasound is performed, showing normal  fibroglandular tissue without focal suspicious sonographic finding in the bilateral subareolar regions. IMPRESSION: 1. No mammographic evidence of malignancy in either breast. 2. No additional sonographic findings to explain the patient's bilateral nipple pain. RECOMMENDATION: 1. Clinical follow-up recommended for the painful area of concern in the bilateral breasts. Any further workup should be based on clinical grounds. Benign causes of breast  pain were discussed with the patient. Patient was encouraged to follow-up with referring physician if pain became localized and persistent or if a palpable lump/mass developed. 2.  Screening mammogram in one year.(Code:SM-B-01Y) I have discussed the findings and recommendations with the patient. Results were also provided in writing at the conclusion of the visit. If applicable, a reminder letter will be sent to the patient regarding the next appointment. BI-RADS CATEGORY  2: Benign. Electronically Signed   By: Kristopher Oppenheim M.D.   On: 01/23/2018 15:32   US Breast Ltd Uni Right Inc Axilla  Result Date: 01/23/2018 CLINICAL DATA:  57 year old female with intermittent, shooting pain in the bilateral subareolar regions for approximately 1 year. EXAM: DIGITAL DIAGNOSTIC BILATERAL MAMMOGRAM WITH CAD AND TOMO ULTRASOUND BILATERAL BREAST COMPARISON:  Previous exam(s). ACR Breast Density Category b: There are scattered areas of fibroglandular density. FINDINGS: Stable circumscribed masses are identified bilaterally. No suspicious mammographic findings are identified in either breast. Mammographic images were processed with CAD. Targeted ultrasound is performed, showing normal fibroglandular tissue without focal suspicious sonographic finding in the bilateral subareolar regions. IMPRESSION: 1. No mammographic evidence of malignancy in either breast. 2. No additional sonographic findings to explain the patient's bilateral nipple pain. RECOMMENDATION: 1. Clinical follow-up recommended for the painful area of concern in the bilateral breasts. Any further workup should be based on clinical grounds. Benign causes of breast pain were discussed with the patient. Patient was encouraged to follow-up with referring physician if pain became localized and persistent or if a palpable lump/mass developed. 2.  Screening mammogram in one year.(Code:SM-B-01Y) I have discussed the findings and recommendations with the patient. Results  were also provided in writing at the conclusion of the visit. If applicable, a reminder letter will be sent to the patient regarding the next appointment. BI-RADS CATEGORY  2: Benign. Electronically Signed   By: Kristopher Oppenheim M.D.   On: 01/23/2018 15:32   Mm Diag Breast Tomo Bilateral  Result Date: 01/23/2018 CLINICAL DATA:  57 year old female with intermittent, shooting pain in the bilateral subareolar regions for approximately 1 year. EXAM: DIGITAL DIAGNOSTIC BILATERAL MAMMOGRAM WITH CAD AND TOMO ULTRASOUND BILATERAL BREAST COMPARISON:  Previous exam(s). ACR Breast Density Category b: There are scattered areas of fibroglandular density. FINDINGS: Stable circumscribed masses are identified bilaterally. No suspicious mammographic findings are identified in either breast. Mammographic images were processed with CAD. Targeted ultrasound is performed, showing normal fibroglandular tissue without focal suspicious sonographic finding in the bilateral subareolar regions. IMPRESSION: 1. No mammographic evidence of malignancy in either breast. 2. No additional sonographic findings to explain the patient's bilateral nipple pain. RECOMMENDATION: 1. Clinical follow-up recommended for the painful area of concern in the bilateral breasts. Any further workup should be based on clinical grounds. Benign causes of breast pain were discussed with the patient. Patient was encouraged to follow-up with referring physician if pain became localized and persistent or if a palpable lump/mass developed. 2.  Screening mammogram in one year.(Code:SM-B-01Y) I have discussed the findings and recommendations with the patient. Results were also provided in writing at the conclusion of the visit. If applicable, a reminder letter will be  sent to the patient regarding the next appointment. BI-RADS CATEGORY  2: Benign. Electronically Signed   By: Kristopher Oppenheim M.D.   On: 01/23/2018 15:32       Assessment & Plan:   Problem List Items  Addressed This Visit    Back pain    Chronic pain.  Evaluated in pain clinic today.        Depression    Has a history of depression.  Worsened recently.  Sees psychiatry.  Walked in to clinic today stating she did not feel good and wanted to die.  Had lengthy discussion with her and her husband.  After discussion, it was determined that the best way to get her treatment would be evaluation at [facility] this pm. Expressed my concern for her safety.   This was discussed at length with her and her husband.  She agreed to go for evaluation voluntarily.  Discussed with ER triage and behavioral intake nurse.  Police called to transport pt.  Pt left office with police to be transported to ER for evaluation.        Essential hypertension, benign    Blood pressure under good control.  Continue same medication regimen.  Follow pressures.  Follow metabolic panel.           I spent 45 minutes with the patient and more than 50% of the time was spent in consultation regarding the above.  Time spent discussing her current concerns. Time also spent discussing plans for treatment and further evaluation.      Einar Pheasant, MD

## 2018-07-23 NOTE — BH Assessment (Signed)
Assessment Note  [name] is an 57 y.o. female. [name] arrived to the ED by way of law enforcement.  She states that she was recommended to come to the ED by her doctor.  She reports that "My depression has just gotten worse".  She states that she was seen by Dr. Gretel Petersen and her medication was changed 2 months ago and her symptoms had gotten worse.  She states that she wanted to see another doctor.  She went by her primary care and the primary care recommended that she come to the Emergency room.  She reports chronic pain from multiple back surgeries and muscle disease. She reports symptoms of depression related to her pain and difficulty walking.  She reports that she is crying more, sad more, she is sleeping less, and eating less. She reports that she has lost about 20 pounds in the last 3 months.  She reports symptoms of anxiety and feeling as though she is going to have a panic attack. She denied having auditory or visual hallucinations. She denied suicidal or homicidal ideation or intent. She denied new stressors.  She denied the use of alcohol or drugs.  She reports that she is a lot less active than she used to be.     Diagnosis: Depression  Past Medical History:  Past Medical History:  Diagnosis Date  . Allergic rhinitis   . Anxiety   . Arthralgia   . Asthma   . Bronchitis, chronic ([place])   . Chronic back pain   . Collagenous colitis    followed by Dr [name]  . Depression   . Diplopia 11/15/2013  . Dysphagia   . Endometriosis   . Fibrocystic breast disease   . GERD (gastroesophageal reflux disease)   . History of colon polyps   . Hyperlipidemia   . Hypertension   . Hypokalemia   . IBS (irritable bowel syndrome)   . Memory difficulty 11/15/2013  . Migraine headache   . Pneumonia 2017   x2  . PONV (postoperative nausea and vomiting)    used patch  . Sleep apnea    recommended CPAP  . Ulcer disease   . Urine incontinence     Past Surgical History:  Procedure  Laterality Date  . ABDOMINAL HYSTERECTOMY     endometriosis  . BACK SURGERY     x6  . BREAST BIOPSY Right   . COLONOSCOPY WITH PROPOFOL N/A 06/25/2015   Procedure: COLONOSCOPY WITH PROPOFOL;  Surgeon: Manya Silvas, MD;  Location: [facility] ENDOSCOPY;  Service: Endoscopy;  Laterality: N/A;  . DILATION AND CURETTAGE OF UTERUS     and hysteroscopy  . ESOPHAGOGASTRODUODENOSCOPY N/A 06/25/2015   Procedure: ESOPHAGOGASTRODUODENOSCOPY (EGD);  Surgeon: Manya Silvas, MD;  Location: [facility] ENDOSCOPY;  Service: Endoscopy;  Laterality: N/A;  . insertion of permanent spinal cord stimulator    . insertion of trial spinal cord stimulator    . LUMBAR DISC SURGERY    . LUMBAR FUSION  4/05 and 8/07  . LUMBAR MICRODISCECTOMY  03/28/03   L4-5 L5  . MOUTH SURGERY    . NASAL SEPTOPLASTY W/ TURBINOPLASTY    . removal of spinal cord stimulator    . SEPTOPLASTY     with reduction of turbinates  . THYROIDECTOMY N/A 06/06/2016   Procedure: THYROIDECTOMY;  Surgeon: [name] Bellow, MD;  Location: [facility] ORS;  Service: General;  Laterality: N/A;    Family History:  Family History  Problem Relation Age of Onset  . Allergies  Mother   . Hypertension Mother   . Arthritis Mother   . Hyperlipidemia Mother   . Stroke Mother   . Hypertension Father   . Arthritis Father   . Diabetes Father   . Hypertension Brother   . Diabetes Brother   . Arthritis Maternal Grandmother   . Hyperlipidemia Maternal Grandmother   . Hyperlipidemia Maternal Grandfather   . Arthritis Paternal Grandmother   . Arthritis Paternal Grandfather   . Asthma Unknown        several family member  . Leukemia Unknown        uncle  . Lymphoma Maternal Aunt        non hodgkins    Social History:  reports that she has never smoked. She has never used smokeless tobacco. She reports that she does not drink alcohol or use drugs.  Additional Social History:  Alcohol / Drug Use History of alcohol / drug use?: No history of alcohol / drug  abuse  CIWA: CIWA-Ar BP: 138/86 Pulse Rate: 81 COWS:    Allergies:  Allergies  Allergen Reactions  . Fentanyl Other (See Comments)    Other reaction(s): Headache, Other (See Comments) headache SEVERE HEADACHE headache  . Sumatriptan Other (See Comments)    Other reaction(s): Headache Severe headache  . Doxycycline Other (See Comments)    Other reaction(s): Headache, Other (See Comments) REACTION: causes severe abd pain REACTION: causes severe abd pain REACTION: causes severe abd pain  . Montelukast Other (See Comments)    SEVERE HEADACHE  . Oxymorphone Other (See Comments)    REACTION: severe headaches  . Benzoin Rash  . Buspirone Other (See Comments)    SEVERE HEADACHE  . Ciprofloxacin Nausea Only  . Conjugated Estrogens Other (See Comments)    headaches  . Cymbalta [Duloxetine Hcl] Other (See Comments)    Headache  . Duloxetine Other (See Comments)    Headache  . Erythromycin Ethylsuccinate Other (See Comments)    Stomach ache  . Estradiol Other (See Comments)    headache  . Metronidazole Other (See Comments)    abd pain  . Mirtazapine Other (See Comments)    headache    Home Medications:  (Not in a hospital admission)  OB/GYN Status:  No LMP recorded. Patient has had a hysterectomy.  General Assessment Data Location of Assessment: [facility] ED TTS Assessment: In system Is this a Tele or Face-to-Face Assessment?: Face-to-Face Is this an Initial Assessment or a Re-assessment for this encounter?: Initial Assessment Patient Accompanied by:: N/A Language Other than English: No Living Arrangements: Other (Comment)(Private residence) What gender do you identify as?: Female Marital status: Married [place] name: [place]iness Pregnancy Status: No Living Arrangements: Spouse/significant other Can pt return to current living arrangement?: Yes Admission Status: Voluntary Is patient capable of signing voluntary admission?: Yes Referral Source:  Self/Family/Friend Insurance type: Medicare/Aetna  Medical Screening Exam ([place]) Medical Exam completed: Yes  Crisis Care Plan Living Arrangements: Spouse/significant other Legal Guardian: Other:(Self) Name of Psychiatrist: Dr. Gretel Petersen Name of Therapist: None  Education Status Is patient currently in school?: No Is the patient employed, unemployed or receiving disability?: Receiving disability income  Risk to self with the past 6 months Suicidal Ideation: No Has patient been a risk to self within the past 6 months prior to admission? : No Suicidal Intent: No Has patient had any suicidal intent within the past 6 months prior to admission? : No Is patient at risk for suicide?: No Suicidal Plan?: No Has patient had any suicidal  plan within the past 6 months prior to admission? : No Access to Means: No What has been your use of drugs/alcohol within the last 12 months?: Denied use Previous Attempts/Gestures: No How many times?: 0 Other Self Harm Risks: denied Triggers for Past Attempts: None known Intentional Self Injurious Behavior: None Family Suicide History: No Recent stressful life event(s): Other (Comment)(Denied) Persecutory voices/beliefs?: No Depression: Yes Depression Symptoms: Despondent, Tearfulness Substance abuse history and/or treatment for substance abuse?: No Suicide prevention information given to non-admitted patients: Not applicable  Risk to Others within the past 6 months Homicidal Ideation: No Does patient have any lifetime risk of violence toward others beyond the six months prior to admission? : No Thoughts of Harm to Others: No Current Homicidal Intent: No Current Homicidal Plan: No Access to Homicidal Means: No Identified Victim: None identified History of harm to others?: No Assessment of Violence: None Noted Does patient have access to weapons?: No Criminal Charges Pending?: No Does patient have a court date: No Is patient on  probation?: No  Psychosis Hallucinations: None noted Delusions: None noted  Mental Status Report Appearance/Hygiene: In scrubs Eye Contact: Good Motor Activity: Unremarkable Speech: Logical/coherent Level of Consciousness: Alert Mood: Depressed Affect: Appropriate to circumstance Anxiety Level: None Thought Processes: Coherent Judgement: Unimpaired Orientation: Appropriate for developmental age Obsessive Compulsive Thoughts/Behaviors: None  Cognitive Functioning Concentration: Normal Memory: Recent Intact Is patient IDD: No Insight: Good Impulse Control: Good Appetite: Poor Have you had any weight changes? : Loss Amount of the weight change? (lbs): 20 lbs Sleep: Decreased Vegetative Symptoms: None  ADLScreening [facility] Assessment Services) Patient's cognitive ability adequate to safely complete daily activities?: Yes Patient able to express need for assistance with ADLs?: Yes Independently performs ADLs?: Yes (appropriate for developmental age)  Prior Inpatient Therapy Prior Inpatient Therapy: No  Prior Outpatient Therapy Prior Outpatient Therapy: Yes Prior Therapy Dates: Current Prior Therapy Facilty/Provider(s): Dr. Gretel Petersen Reason for Treatment: Depression Does patient have an ACCT team?: No Does patient have Intensive In-House Services?  : No Does patient have [facility] services? : No Does patient have P4CC services?: No  ADL Screening (condition at time of admission) Patient's cognitive ability adequate to safely complete daily activities?: Yes Is the patient deaf or have difficulty hearing?: No Does the patient have difficulty seeing, even when wearing glasses/contacts?: No Does the patient have difficulty concentrating, remembering, or making decisions?: No Patient able to express need for assistance with ADLs?: Yes Does the patient have difficulty dressing or bathing?: No Independently performs ADLs?: Yes (appropriate for developmental age) Does the patient  have difficulty walking or climbing stairs?: Yes(Uses a walker due to back pain) Weakness of Legs: None Weakness of Arms/Hands: None  Home Assistive Devices/Equipment Home Assistive Devices/Equipment: Environmental consultant (specify type)(Walker with wheels)    Abuse/Neglect Assessment (Assessment to be complete while patient is alone) Abuse/Neglect Assessment Can Be Completed: (Patient denied a  history of abuse)     Advance Directives (For Healthcare) Does Patient Have a Medical Advance Directive?: No          Disposition:  Disposition Initial Assessment Completed for this Encounter: Yes  On Site Evaluation by:   Reviewed with Physician:    Elmer Bales 07/23/2018 9:33 PM

## 2018-07-23 NOTE — Progress Notes (Signed)
BH MD/PA/NP OP Progress Note  07/23/2018 3:54 PM [name]  MRN:  [id number]  Subjective:   Patient is a 57 year old female who presented for follow up accompanied by her husband.  She is currently in the wheelchair.  Patient reported that she has noticed increase in her present symptoms and reported that Lexapro is not helping her.  She continues to complain about the medication and reported that she wants to go back on the Effexor as she was taking higher dose of the medication in the past.  Her husband also reported that she was taking 350 mg of Effexor in the past.  Patient reported that she spends most of the time in the bed and does not sleep at night but sleeps in the morning.  She has been taking Valium and Ambien as prescribed by the pain clinic.  She is just coming back from the appointment from the pain clinic at this time.  Patient appears to be somewhat sedated under the influence of the Valium but she continues to reference that she is not sedated and the medications does not make her sleepy and tired.  She is getting benzodiazepines and Ambien from the pain clinic in [place].  Her husband remains supportive and does not want to decrease the dose of her medications.    Patient currently denied having any suicidal homicidal ideations or plans.  She denied having any perceptual disturbances.  She is getting a combination of opioid as well as benzodiazepine from the pain clinic in [place].      She is receptive to medication changes at this time.    Her appetite and sleep are normal.  She stated that she has chronic pain and does not want to have her pain medication changes       Chief Complaint:   Visit Diagnosis:     ICD-10-CM   1. MDD (major depressive disorder), recurrent episode, moderate (HCC) F33.1   2. Anxiety state F41.1     Past Medical History:  Past Medical History:  Diagnosis Date  . Allergic rhinitis   . Anxiety   . Arthralgia   . Asthma   .  Bronchitis, chronic ([place])   . Chronic back pain   . Collagenous colitis    followed by Dr Tiffany Kocher  . Depression   . Diplopia 11/15/2013  . Dysphagia   . Endometriosis   . Fibrocystic breast disease   . GERD (gastroesophageal reflux disease)   . History of colon polyps   . Hyperlipidemia   . Hypertension   . Hypokalemia   . IBS (irritable bowel syndrome)   . Memory difficulty 11/15/2013  . Migraine headache   . Pneumonia 2017   x2  . PONV (postoperative nausea and vomiting)    used patch  . Sleep apnea    recommended CPAP  . Ulcer disease   . Urine incontinence     Past Surgical History:  Procedure Laterality Date  . ABDOMINAL HYSTERECTOMY     endometriosis  . BACK SURGERY     x6  . BREAST BIOPSY Right   . COLONOSCOPY WITH PROPOFOL N/A 06/25/2015   Procedure: COLONOSCOPY WITH PROPOFOL;  Surgeon: Manya Silvas, MD;  Location: [facility] ENDOSCOPY;  Service: Endoscopy;  Laterality: N/A;  . DILATION AND CURETTAGE OF UTERUS     and hysteroscopy  . ESOPHAGOGASTRODUODENOSCOPY N/A 06/25/2015   Procedure: ESOPHAGOGASTRODUODENOSCOPY (EGD);  Surgeon: Manya Silvas, MD;  Location: [facility] ENDOSCOPY;  Service: Endoscopy;  Laterality: N/A;  .  insertion of permanent spinal cord stimulator    . insertion of trial spinal cord stimulator    . LUMBAR DISC SURGERY    . LUMBAR FUSION  4/05 and 8/07  . LUMBAR MICRODISCECTOMY  03/28/03   L4-5 L5  . MOUTH SURGERY    . NASAL SEPTOPLASTY W/ TURBINOPLASTY    . removal of spinal cord stimulator    . SEPTOPLASTY     with reduction of turbinates  . THYROIDECTOMY N/A 06/06/2016   Procedure: THYROIDECTOMY;  Surgeon: Robert Bellow, MD;  Location: [facility] ORS;  Service: General;  Laterality: N/A;   Family History:  Family History  Problem Relation Age of Onset  . Allergies Mother   . Hypertension Mother   . Arthritis Mother   . Hyperlipidemia Mother   . Stroke Mother   . Hypertension Father   . Arthritis Father   . Diabetes Father   .  Hypertension Brother   . Diabetes Brother   . Arthritis Maternal Grandmother   . Hyperlipidemia Maternal Grandmother   . Hyperlipidemia Maternal Grandfather   . Arthritis Paternal Grandmother   . Arthritis Paternal Grandfather   . Asthma Unknown        several family member  . Leukemia Unknown        uncle  . Lymphoma Maternal Aunt        non hodgkins   Social History:  Social History   Socioeconomic History  . Marital status: Married    Spouse name: douglas  . Number of children: 1  . Years of education: 15 th  . Highest education level: High school graduate  Occupational History  . Occupation: Disabled    Employer: DISABLED  Social Needs  . Financial resource strain: Not hard at all  . Food insecurity:    Worry: Never true    Inability: Never true  . Transportation needs:    Medical: No    Non-medical: No  Tobacco Use  . Smoking status: Never Smoker  . Smokeless tobacco: Never Used  Substance and Sexual Activity  . Alcohol use: No    Alcohol/week: 0.0 standard drinks  . Drug use: No  . Sexual activity: Not Currently  Lifestyle  . Physical activity:    Days per week: 0 days    Minutes per session: 0 min  . Stress: Very much  Relationships  . Social connections:    Talks on phone: Three times a week    Gets together: Never    Attends religious service: More than 4 times per year    Active member of club or organization: No    Attends meetings of clubs or organizations: Never    Relationship status: Not on file  Other Topics Concern  . Not on file  Social History Narrative  . Not on file   Additional History:  Married x 37 years. Has a daughter and has 3 grandsons.  Was following Dr Mamie Nick for almost 3 years.    Assessment:   Musculoskeletal: Strength & Muscle Tone: within normal limits Gait & Station: unsteady Patient leans: N/A  Psychiatric Specialty Exam: Medication Refill  Associated symptoms include headaches and weakness.    Review of  Systems  Constitutional: Positive for malaise/fatigue.  Eyes: Positive for blurred vision.  Neurological: Positive for weakness and headaches.  Psychiatric/Behavioral: Positive for depression.    There were no vitals taken for this visit.There is no height or weight on file to calculate BMI.  General Appearance: Casual  Eye Contact:  Fair  Speech:  Clear and Coherent  Volume:  Decreased  Mood:  Anxious  Affect:  Congruent and Constricted  Thought Process:  Coherent  Orientation:  Full (Time, Place, and Person)  Thought Content:  WDL  Suicidal Thoughts:  No  Homicidal Thoughts:  No  Memory:  Immediate;   Fair  Judgement:  Fair  Insight:  Fair  Psychomotor Activity:  Decreased  Concentration:  Fair  Recall:  [employer] of Knowledge: Fair  Language: Fair  Akathisia:  No  Handed:  Right  AIMS (if indicated):    Assets:  Communication Skills Desire for Improvement Physical Health Social Support  ADL's:  Intact  Cognition: WNL  Sleep:  6-7   Is the patient at risk to self?  No. Has the patient been a risk to self in the past 6 months?  No. Has the patient been a risk to self within the distant past?  No. Is the patient a risk to others?  No. Has the patient been a risk to others in the past 6 months?  No. Has the patient been a risk to others within the distant past?  No.  Current Medications: Current Outpatient Medications  Medication Sig Dispense Refill  . albuterol (PROVENTIL HFA;VENTOLIN HFA) 108 (90 Base) MCG/ACT inhaler Inhale 2 puffs into the lungs every 4 (four) hours as needed for wheezing or shortness of breath. 1 Inhaler 3  . albuterol (PROVENTIL) (2.5 MG/3ML) 0.083% nebulizer solution Take 2.5 mg by nebulization every 4 (four) hours as needed for wheezing or shortness of breath.    Marland Kitchen amLODipine (NORVASC) 5 MG tablet TAKE 1 TABLET BY MOUTH EVERY DAY 90 tablet 1  . bisacodyl (DULCOLAX) 5 MG EC tablet Take by mouth.    . budesonide-formoterol (SYMBICORT) 160-4.5  MCG/ACT inhaler Inhale 2 puffs into the lungs 2 (two) times daily. 1 Inhaler 12  . diazepam (VALIUM) 5 MG tablet Take 5 mg by mouth 3 (three) times daily as needed.      . docusate sodium (COLACE) 100 MG capsule Take 100 mg by mouth 2 (two) times daily.    Marland Kitchen escitalopram (LEXAPRO) 10 MG tablet Take 1 tablet (10 mg total) by mouth daily. 30 tablet 1  . GRALISE 600 MG TABS     . hydrochlorothiazide (HYDRODIURIL) 25 MG tablet Take 25 mg by mouth daily.  11  . metoprolol tartrate (LOPRESSOR) 50 MG tablet TAKE 1/2 TABLET BY MOUTH TWICE A DAY (NEED OFFICE VISIT) 90 tablet 0  . mometasone (NASONEX) 50 MCG/ACT nasal spray Place into the nose.    . Multiple Vitamin (MULTIVITAMIN) capsule Take 1 capsule by mouth daily.      . Omega-3 Fatty Acids (FISH OIL) 1200 MG CAPS Take 1 capsule by mouth daily.    Dellia Nims CALCIUM + D3 500-200 MG-UNIT TABS TAKE 2 TABLETS BY MOUTH 2 (TWO) TIMES DAILY.  6  . Oxycodone HCl 10 MG TABS Take 10 mg by mouth 3 (three) times daily as needed.  0  . potassium chloride (K-DUR) 10 MEQ tablet TAKE 1 TABLET BY MOUTH TWICE A DAY 60 tablet 0  . pravastatin (PRAVACHOL) 10 MG tablet TAKE 1 TABLET (10 MG TOTAL) BY MOUTH DAILY. 45 tablet 5  . predniSONE (DELTASONE) 5 MG tablet TAKE 1 TABLET BY MOUTH EVERY DAY WITH BREAKFAST 30 tablet 0  . RELISTOR 150 MG TABS TAKE 3 TABLETS BY MOUTH ONCE A DAY prn  2  . TIROSINT 137 MCG CAPS TAKE 1 CAPSULE BY  MOUTH DAILY BEFORE BREAKFAST. 30 capsule 2  . venlafaxine XR (EFFEXOR-XR) 37.5 MG 24 hr capsule Take 1 capsule (37.5 mg total) by mouth daily with breakfast. Take 2 caps in Am x 2 weeks then 1 cap in AM x 2 week then STOP 60 capsule 0  . XTAMPZA ER 36 MG C12A TAKE 1 CAPSULE BY MOUTH EVERY 12 HOURS  0  . zolpidem (AMBIEN) 10 MG tablet Take 10 mg by mouth at bedtime as needed.       No current facility-administered medications for this visit.      Medical Decision Making:  Review of Psycho-Social Stressors (1)  Treatment Plan Summary:Medication  management   I reviewed all the medications with the patient and her husband. She is getting Valium and Ambien from the [place] pain clinic.  Patient continues to appear sedated under the influence of these medications and is not willing to have her medications adjusted   We will discontinue Lexapro. Start her on Effexor XR 150 mg p.o. every morning.  Continue other medications as prescribed..  Patient to call the office if she notices worsening of her symptoms.  She will follow-up in 2 months or earlier depending on her symptoms.       More than 50% of the time spent in psychoeducation, counseling and coordination of care.    This note was generated in part or whole with voice recognition software. Voice regonition is usually quite accurate but there are transcription errors that can and very often do occur. I apologize for any typographical errors that were not detected and corrected.     Rainey Pines, MD    07/23/2018, 3:54 PM

## 2018-07-23 NOTE — Assessment & Plan Note (Signed)
Blood pressure under good control.  Continue same medication regimen.  Follow pressures.  Follow metabolic panel.   

## 2018-07-23 NOTE — Assessment & Plan Note (Signed)
Chronic pain.  Evaluated in pain clinic today.

## 2018-07-23 NOTE — ED Provider Notes (Addendum)
[facility] Emergency Department Provider Note ____________________________________________   First MD Initiated Contact with Patient 07/23/18 1900     (approximate)  I have reviewed the triage vital signs and the nursing notes.   HISTORY  Chief Complaint Depression    HPI [name] is a 57 y.o. female with PMH as noted below presents with worsening depression, gradual onset, and associated with a feeling of not wanting to be alive anymore.  The patient denies any specific suicidal ideation and states that she would not want to harm herself because she cares about her grandchildren.  However she states that she just does not want to live.  She denies any HI or other acute psychiatric symptoms.  She denies any medical symptoms.   Past Medical History:  Diagnosis Date  . Allergic rhinitis   . Anxiety   . Arthralgia   . Asthma   . Bronchitis, chronic ([place])   . Chronic back pain   . Collagenous colitis    followed by Dr Tiffany Kocher  . Depression   . Diplopia 11/15/2013  . Dysphagia   . Endometriosis   . Fibrocystic breast disease   . GERD (gastroesophageal reflux disease)   . History of colon polyps   . Hyperlipidemia   . Hypertension   . Hypokalemia   . IBS (irritable bowel syndrome)   . Memory difficulty 11/15/2013  . Migraine headache   . Pneumonia 2017   x2  . PONV (postoperative nausea and vomiting)    used patch  . Sleep apnea    recommended CPAP  . Ulcer disease   . Urine incontinence     Patient Active Problem List   Diagnosis Date Noted  . Breast pain 01/14/2018  . Abdominal pain 01/14/2018  . Hot flashes 01/25/2017  . Chest pain on exertion 12/15/2016  . Chest tightness 11/25/2016  . SOB (shortness of breath) 11/25/2016  . 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 Chronic thyroiditis 06/15/2016  . Sinusitis, chronic 02/11/2016  . Recurrent upper respiratory infection (URI) 02/11/2016  . Pneumonia 01/16/2016  . Anxiety  09/03/2015  . Ache in joint 09/03/2015  . Benign hypertension 09/03/2015  . Endometriosis 09/03/2015  . History of migraine headaches 09/03/2015  . H/O adenomatous polyp of colon 09/03/2015  . Adaptive colitis 09/03/2015  . Apnea, sleep 09/03/2015  . Health care maintenance 08/06/2015  . History of colonic polyps 06/30/2015  . Rash 05/05/2015  . 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 Memory difficulty 11/15/2013  . Diplopia 11/15/2013  . 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 Incomplete bladder emptying 10/31/2013  . Depression 12/03/2012  . Essential hypertension, benign 12/03/2012  . Hypercholesterolemia 12/03/2012  . Obstructive sleep apnea 12/03/2012  . Collagenous colitis 12/03/2012  . DERMATOMYOSITIS 11/12/2009  . ALLERGIC RHINITIS 05/25/2007  . Asthma 05/25/2007  . GERD 05/25/2007  . Back pain 05/25/2007  . HEADACHE 05/25/2007    Past Surgical History:  Procedure Laterality Date  . ABDOMINAL HYSTERECTOMY     endometriosis  . BACK SURGERY     x6  . BREAST BIOPSY Right   . COLONOSCOPY WITH PROPOFOL N/A 06/25/2015   Procedure: COLONOSCOPY WITH PROPOFOL;  Surgeon: Manya Silvas, MD;  Location: [facility] ENDOSCOPY;  Service: Endoscopy;  Laterality: N/A;  . DILATION AND CURETTAGE OF UTERUS     and hysteroscopy  . ESOPHAGOGASTRODUODENOSCOPY N/A 06/25/2015   Procedure: ESOPHAGOGASTRODUODENOSCOPY (EGD);  Surgeon: Manya Silvas, MD;  Location: [facility] ENDOSCOPY;  Service: Endoscopy;  Laterality: N/A;  .  insertion of permanent spinal cord stimulator    . insertion of trial spinal cord stimulator    . LUMBAR DISC SURGERY    . LUMBAR FUSION  4/05 and 8/07  . LUMBAR MICRODISCECTOMY  03/28/03   L4-5 L5  . MOUTH SURGERY    . NASAL SEPTOPLASTY W/ TURBINOPLASTY    . removal of spinal cord stimulator    . SEPTOPLASTY     with reduction of turbinates  . THYROIDECTOMY N/A 06/06/2016   Procedure: THYROIDECTOMY;  Surgeon: Robert Bellow, MD;  Location: [facility] ORS;  Service:  General;  Laterality: N/A;    Prior to Admission medications   Medication Sig Start Date End Date Taking? Authorizing Provider  albuterol (PROVENTIL HFA;VENTOLIN HFA) 108 (90 Base) MCG/ACT inhaler Inhale 2 puffs into the lungs every 4 (four) hours as needed for wheezing or shortness of breath. 12/12/16   Wilhelmina Mcardle, MD  albuterol (PROVENTIL) (2.5 MG/3ML) 0.083% nebulizer solution Take 2.5 mg by nebulization every 4 (four) hours as needed for wheezing or shortness of breath.    [provider]  amLODipine (NORVASC) 5 MG tablet TAKE 1 TABLET BY MOUTH EVERY DAY 05/29/18   Einar Pheasant, MD  bisacodyl (DULCOLAX) 5 MG EC tablet Take by mouth.    [provider]  budesonide-formoterol (SYMBICORT) 160-4.5 MCG/ACT inhaler Inhale 2 puffs into the lungs 2 (two) times daily. 04/10/17   Wilhelmina Mcardle, MD  diazepam (VALIUM) 5 MG tablet Take 5 mg by mouth 3 (three) times daily as needed.      [provider]  docusate sodium (COLACE) 100 MG capsule Take 100 mg by mouth 2 (two) times daily.    [provider]  GRALISE 600 MG TABS  12/22/17   [provider]  hydrochlorothiazide (HYDRODIURIL) 25 MG tablet Take 25 mg by mouth daily. 12/12/16   [provider]  metoprolol tartrate (LOPRESSOR) 50 MG tablet TAKE 1/2 TABLET BY MOUTH TWICE A DAY (NEED OFFICE VISIT) 07/20/18   Einar Pheasant, MD  mometasone (NASONEX) 50 MCG/ACT nasal spray Place into the nose.    [provider]  Multiple Vitamin (MULTIVITAMIN) capsule Take 1 capsule by mouth daily.      [provider]  Omega-3 Fatty Acids (FISH OIL) 1200 MG CAPS Take 1 capsule by mouth daily.    [provider]  OS-CAL CALCIUM + D3 500-200 MG-UNIT TABS TAKE 2 TABLETS BY MOUTH 2 (TWO) TIMES DAILY. 01/23/17   [provider]  Oxycodone HCl 10 MG TABS Take 10 mg by mouth 3 (three) times daily as needed. 04/02/15   [provider]  potassium chloride (K-DUR) 10 MEQ  tablet TAKE 1 TABLET BY MOUTH TWICE A DAY 07/09/18   Einar Pheasant, MD  pravastatin (PRAVACHOL) 10 MG tablet TAKE 1 TABLET (10 MG TOTAL) BY MOUTH DAILY. 12/11/17   Einar Pheasant, MD  RELISTOR 150 MG TABS TAKE 3 TABLETS BY MOUTH ONCE A DAY prn 10/20/15   [provider]  TIROSINT 137 MCG CAPS TAKE 1 CAPSULE BY MOUTH DAILY BEFORE BREAKFAST. 07/16/18   Einar Pheasant, MD  venlafaxine XR (EFFEXOR-XR) 150 MG 24 hr capsule Take 1 capsule (150 mg total) by mouth daily with breakfast. 07/23/18   Rainey Pines, MD  XTAMPZA ER 36 MG C12A TAKE 1 CAPSULE BY MOUTH EVERY 12 HOURS 12/25/15   [provider]  zolpidem (AMBIEN) 10 MG tablet Take 10 mg by mouth at bedtime as needed.      [provider]  triamcinolone (NASACORT AQ) 55 MCG/ACT AERO nasal inhaler Place 2 sprays into the nose 2 (two) times daily. 1 sprays each nostril twice a day 06/02/16 06/02/16  Robert Bellow, MD    Allergies Fentanyl; Sumatriptan; Doxycycline; Montelukast; Oxymorphone; Benzoin; Buspirone; Ciprofloxacin; Conjugated estrogens; Cymbalta [duloxetine hcl]; Duloxetine; Erythromycin ethylsuccinate; Estradiol; Metronidazole; and Mirtazapine  Family History  Problem Relation Age of Onset  . Allergies Mother   . Hypertension Mother   . Arthritis Mother   . Hyperlipidemia Mother   . Stroke Mother   . Hypertension Father   . Arthritis Father   . Diabetes Father   . Hypertension Brother   . Diabetes Brother   . Arthritis Maternal Grandmother   . Hyperlipidemia Maternal Grandmother   . Hyperlipidemia Maternal Grandfather   . Arthritis Paternal Grandmother   . Arthritis Paternal Grandfather   . Asthma Unknown        several family member  . Leukemia Unknown        uncle  . Lymphoma Maternal Aunt        non hodgkins    Social History Social History   Tobacco Use  . Smoking status: Never Smoker  . Smokeless tobacco: Never Used  Substance Use Topics  . Alcohol use: No    Alcohol/week: 0.0  standard drinks  . Drug use: No    Review of Systems  Constitutional: No fever. Eyes: No redness. ENT: No sore throat. Cardiovascular: Denies chest pain. Respiratory: Denies shortness of breath. Gastrointestinal: No vomiting.  Genitourinary: Negative for dysuria.  Musculoskeletal: Negative for back pain. Skin: Negative for rash. Neurological: Negative for headache.   ____________________________________________   PHYSICAL EXAM:  VITAL SIGNS: ED Triage Vitals [07/23/18 1843]  Enc Vitals Group     BP 138/86     Pulse Rate 81     Resp 18     Temp 98.2 F (36.8 C)     Temp Source Oral     SpO2 96 %     Weight 226 lb (102.5 kg)     Height 5\' 7"  (1.702 m)     Head Circumference      Peak Flow      Pain Score 0     Pain Loc      Pain Edu?      Excl. in [place]?     Constitutional: Alert and oriented. Well appearing and in no acute distress. Eyes: Conjunctivae are normal.  Head: Atraumatic. Nose: No congestion/rhinnorhea. Mouth/Throat: Mucous membranes are moist.   Neck: Normal range of motion.  Cardiovascular: Good peripheral circulation. Respiratory: Normal respiratory effort.  Gastrointestinal: No distention.  Musculoskeletal: Extremities warm and well perfused.  Neurologic:  Normal speech and language. No gross focal neurologic deficits are appreciated.  Skin:  Skin is warm and dry. No rash noted. Psychiatric: Somewhat flat affect.  Calm and cooperative. ____________________________________________   LABS (all labs ordered are listed, but only abnormal results are displayed)  Labs Reviewed  COMPREHENSIVE METABOLIC PANEL - Abnormal; Notable for the following components:      Result Value   Potassium 2.9 (*)    Glucose, Bld 102 (*)    Calcium 8.6 (*)    All other components within normal limits  ACETAMINOPHEN LEVEL - Abnormal; Notable for the following components:   Acetaminophen (Tylenol), Serum <10 (*)    All other components within normal limits  URINE  DRUG SCREEN, QUALITATIVE ([facility] ONLY) - Abnormal; Notable for the following components:   Tricyclic, Ur Screen POSITIVE (*)  Opiate, Ur Screen POSITIVE (*)    Benzodiazepine, Ur Scrn POSITIVE (*)    All other components within normal limits  ETHANOL  SALICYLATE LEVEL  CBC  POC URINE PREG, ED   ____________________________________________  EKG   ____________________________________________  RADIOLOGY    ____________________________________________   PROCEDURES  Procedure(s) performed: No  Procedures  Critical Care performed: No ____________________________________________   INITIAL IMPRESSION / ASSESSMENT AND PLAN / ED COURSE  Pertinent labs & imaging results that were available during my care of the patient were reviewed by me and considered in my medical decision making (see chart for details).  57 year old female with PMH as noted above presents with worsening depression.  Patient states she feels like she does not want to live, but denies specific suicidal intent or plan.  She has no acute medical complaints.  On exam, the patient is comfortable appearing and her vital signs are normal.  The remainder of the exam is unremarkable.  On review of the past medical records in Epic, she has not had any recent ED visits or admissions here.  We will obtain lab work-up to medically clear the patient, as well as psychiatric evaluation.  At this time, the patient wants to be here, denies any specific suicidal ideation other than a vague feeling of not wanting to be alive, and is able to contract for safety.  Therefore there is no indication for involuntary commitment at this time.  ----------------------------------------- 11:45 PM on 07/23/2018 -----------------------------------------  The patient's work-up is notable for mild hypokalemia as well as UDS positive for opiates and benzodiazepines.  I gave p.o. potassium to start repletion.  She is pending SOC evaluation.   I am signing the patient out to the oncoming physician Dr. Beather Arbour. ____________________________________________   FINAL CLINICAL IMPRESSION(S) / ED DIAGNOSES  Final diagnoses:  Depressed mood      NEW MEDICATIONS STARTED DURING THIS VISIT:  New Prescriptions   No medications on file     Note:  This document was prepared using Dragon voice recognition software and may include unintentional dictation errors.       Arta Silence, MD 07/23/18 [phone]

## 2018-07-23 NOTE — ED Triage Notes (Signed)
Pt has had severe depression for last 2 months.  Pt states "I don't have a reason to live any more, I just wish I could go to sleep and not wake up".  All belongings are given to husband.  Reports has been trying to get help but her doctor has not helped her.  Her medications were recently switched.

## 2018-07-23 NOTE — ED Notes (Signed)
Pt. Transferred to [place] from ED to room 4 after screening for contraband. Report to include Situation, Background, Assessment and Recommendations from [facility]. Pt. Oriented to unit including Q15 minute rounds as well as the security cameras for their protection. Patient is alert and oriented, warm and dry in no acute distress. Patient denies SI, HI, and AVH. Pt. Encouraged to let me know if needs arise.

## 2018-07-23 NOTE — ED Notes (Signed)
Hourly rounding reveals patient sleeping in room. No complaints, stable, in no acute distress. Q15 minute rounds and monitoring via Security Cameras to continue. 

## 2018-07-24 NOTE — Discharge Instructions (Addendum)
Continue your home medicines as directed by your doctor.  Return to the ER for feelings of hurting yourself or others, or other concerns.

## 2018-07-24 NOTE — ED Provider Notes (Signed)
-----------------------------------------   12:42 AM on 07/24/2018 -----------------------------------------  Patient was evaluated by [facility] psychiatrist Dr. Roosevelt Locks who recommends discharge home with outpatient follow-up.  Patient does not meet criteria for IVC.  She is to continue her home medications.  Strict return precautions given.  Patient verbalizes understanding and agrees with plan of care.   Paulette Blanch, MD 07/24/18 [phone]

## 2018-07-24 NOTE — ED Notes (Signed)
Hourly rounding reveals patient in room. No complaints, stable, in no acute distress. Q15 minute rounds and monitoring via Security Cameras to continue. 

## 2018-07-30 ENCOUNTER — Ambulatory Visit: Payer: Medicare Other | Admitting: Psychiatry

## 2018-08-09 ENCOUNTER — Other Ambulatory Visit: Payer: Self-pay | Admitting: Internal Medicine

## 2018-08-15 ENCOUNTER — Other Ambulatory Visit: Payer: Self-pay | Admitting: Psychiatry

## 2018-08-23 DIAGNOSIS — M47817 Spondylosis without myelopathy or radiculopathy, lumbosacral region: Secondary | ICD-10-CM | POA: Diagnosis not present

## 2018-08-23 DIAGNOSIS — G4700 Insomnia, unspecified: Secondary | ICD-10-CM | POA: Diagnosis not present

## 2018-08-23 DIAGNOSIS — G894 Chronic pain syndrome: Secondary | ICD-10-CM | POA: Diagnosis not present

## 2018-08-23 DIAGNOSIS — M62830 Muscle spasm of back: Secondary | ICD-10-CM | POA: Diagnosis not present

## 2018-08-24 ENCOUNTER — Other Ambulatory Visit: Payer: Self-pay | Admitting: Internal Medicine

## 2018-09-19 ENCOUNTER — Other Ambulatory Visit: Payer: Self-pay | Admitting: Internal Medicine

## 2018-09-20 DIAGNOSIS — G4700 Insomnia, unspecified: Secondary | ICD-10-CM | POA: Diagnosis not present

## 2018-09-20 DIAGNOSIS — G894 Chronic pain syndrome: Secondary | ICD-10-CM | POA: Diagnosis not present

## 2018-09-20 DIAGNOSIS — M47817 Spondylosis without myelopathy or radiculopathy, lumbosacral region: Secondary | ICD-10-CM | POA: Diagnosis not present

## 2018-09-20 DIAGNOSIS — M62830 Muscle spasm of back: Secondary | ICD-10-CM | POA: Diagnosis not present

## 2018-09-24 ENCOUNTER — Encounter: Payer: Self-pay | Admitting: Psychiatry

## 2018-09-24 ENCOUNTER — Ambulatory Visit (INDEPENDENT_AMBULATORY_CARE_PROVIDER_SITE_OTHER): Payer: Medicare Other | Admitting: Psychiatry

## 2018-09-24 ENCOUNTER — Other Ambulatory Visit: Payer: Self-pay

## 2018-09-24 VITALS — BP 121/83 | HR 82 | Temp 98.1°F

## 2018-09-24 DIAGNOSIS — F331 Major depressive disorder, recurrent, moderate: Secondary | ICD-10-CM | POA: Diagnosis not present

## 2018-09-24 DIAGNOSIS — F411 Generalized anxiety disorder: Secondary | ICD-10-CM | POA: Diagnosis not present

## 2018-09-24 MED ORDER — VENLAFAXINE HCL ER 150 MG PO CP24: 150.0000 mg | ORAL_CAPSULE | Freq: Every day | ORAL | 1 refills | Status: DC

## 2018-09-24 NOTE — Progress Notes (Signed)
BH MD/PA/NP OP Progress Note  09/24/2018 3:37 PM [name]  MRN:  [id number]  Subjective:   Patient is a 58 year old female who presented for follow up accompanied by her husband.  She is currently in the wheelchair.  Patient reported that she has noticed symptoms are getting better but she was focused on getting a higher dose of the Effexor.  Discussed with the patient about her last appointment as she went to the emergency room after she left this appointment.  She reported that she was upset after her appointment and she went to the front and asked the receptionist about changing the psychiatrist.  She was advised that she will not be able to get what "she is looking for in this practice". She reported that she became upset and went to her PCP Dr. Nicki Reaper office and tried to ask them for the medications.  Dr. Nicki Reaper referred her to the ER.  She was evaluated by tele-psych who discharged her without any medication changes.  Patient reported that she continues to go to the pain clinic and they have been prescribing her pain medication as well as Ambien and the Valium.  She reported that she does not want to change any medications now.   Advised patient that her medication dose of Effexor will not be changed as she is a stable and has increased risk of having serotonin syndrome with higher dose in combination with other medication.  Patient demonstrated understanding.    Her appetite and sleep are normal.  She stated that she has chronic pain and does not want to have her pain medication changes       Chief Complaint:  Chief Complaint    Follow-up; Medication Refill     Visit Diagnosis:     ICD-10-CM   1. MDD (major depressive disorder), recurrent episode, moderate (HCC) F33.1   2. Anxiety state F41.1     Past Medical History:  Past Medical History:  Diagnosis Date  . Allergic rhinitis   . Anxiety   . Arthralgia   . Asthma   . Bronchitis, chronic ([place])   . Chronic back pain    . Collagenous colitis    followed by Dr Tiffany Kocher  . Depression   . Diplopia 11/15/2013  . Dysphagia   . Endometriosis   . Fibrocystic breast disease   . GERD (gastroesophageal reflux disease)   . History of colon polyps   . Hyperlipidemia   . Hypertension   . Hypokalemia   . IBS (irritable bowel syndrome)   . Memory difficulty 11/15/2013  . Migraine headache   . Pneumonia 2017   x2  . PONV (postoperative nausea and vomiting)    used patch  . Sleep apnea    recommended CPAP  . Ulcer disease   . Urine incontinence     Past Surgical History:  Procedure Laterality Date  . ABDOMINAL HYSTERECTOMY     endometriosis  . BACK SURGERY     x6  . BREAST BIOPSY Right   . COLONOSCOPY WITH PROPOFOL N/A 06/25/2015   Procedure: COLONOSCOPY WITH PROPOFOL;  Surgeon: Manya Silvas, MD;  Location: [facility] ENDOSCOPY;  Service: Endoscopy;  Laterality: N/A;  . DILATION AND CURETTAGE OF UTERUS     and hysteroscopy  . ESOPHAGOGASTRODUODENOSCOPY N/A 06/25/2015   Procedure: ESOPHAGOGASTRODUODENOSCOPY (EGD);  Surgeon: Manya Silvas, MD;  Location: [facility] ENDOSCOPY;  Service: Endoscopy;  Laterality: N/A;  . insertion of permanent spinal cord stimulator    . insertion of trial spinal  cord stimulator    . LUMBAR DISC SURGERY    . LUMBAR FUSION  4/05 and 8/07  . LUMBAR MICRODISCECTOMY  03/28/03   L4-5 L5  . MOUTH SURGERY    . NASAL SEPTOPLASTY W/ TURBINOPLASTY    . removal of spinal cord stimulator    . SEPTOPLASTY     with reduction of turbinates  . THYROIDECTOMY N/A 06/06/2016   Procedure: THYROIDECTOMY;  Surgeon: Robert Bellow, MD;  Location: [facility] ORS;  Service: General;  Laterality: N/A;   Family History:  Family History  Problem Relation Age of Onset  . Allergies Mother   . Hypertension Mother   . Arthritis Mother   . Hyperlipidemia Mother   . Stroke Mother   . Hypertension Father   . Arthritis Father   . Diabetes Father   . Hypertension Brother   . Diabetes Brother   . Arthritis  Maternal Grandmother   . Hyperlipidemia Maternal Grandmother   . Hyperlipidemia Maternal Grandfather   . Arthritis Paternal Grandmother   . Arthritis Paternal Grandfather   . Asthma Unknown        several family member  . Leukemia Unknown        uncle  . Lymphoma Maternal Aunt        non hodgkins   Social History:  Social History   Socioeconomic History  . Marital status: Married    Spouse name: douglas  . Number of children: 1  . Years of education: 68 th  . Highest education level: High school graduate  Occupational History  . Occupation: Disabled    Employer: DISABLED  Social Needs  . Financial resource strain: Not hard at all  . Food insecurity:    Worry: Never true    Inability: Never true  . Transportation needs:    Medical: No    Non-medical: No  Tobacco Use  . Smoking status: Never Smoker  . Smokeless tobacco: Never Used  Substance and Sexual Activity  . Alcohol use: No    Alcohol/week: 0.0 standard drinks  . Drug use: No  . Sexual activity: Not Currently  Lifestyle  . Physical activity:    Days per week: 0 days    Minutes per session: 0 min  . Stress: Very much  Relationships  . Social connections:    Talks on phone: Three times a week    Gets together: Never    Attends religious service: More than 4 times per year    Active member of club or organization: No    Attends meetings of clubs or organizations: Never    Relationship status: Not on file  Other Topics Concern  . Not on file  Social History Narrative  . Not on file   Additional History:  Married x 37 years. Has a daughter and has 3 grandsons.  Was following Dr Mamie Nick for almost 3 years.    Assessment:   Musculoskeletal: Strength & Muscle Tone: within normal limits Gait & Station: unsteady Patient leans: N/A  Psychiatric Specialty Exam: Medication Refill  Associated symptoms include headaches and weakness.    Review of Systems  Constitutional: Positive for malaise/fatigue.  Eyes:  Positive for blurred vision.  Neurological: Positive for weakness and headaches.  Psychiatric/Behavioral: Positive for depression.    Blood pressure 121/83, pulse 82, temperature 98.1 F (36.7 C), temperature source Oral.There is no height or weight on file to calculate BMI.  General Appearance: Casual  Eye Contact:  Fair  Speech:  Clear and Coherent  Volume:  Decreased  Mood:  Anxious  Affect:  Congruent and Constricted  Thought Process:  Coherent  Orientation:  Full (Time, Place, and Person)  Thought Content:  WDL  Suicidal Thoughts:  No  Homicidal Thoughts:  No  Memory:  Immediate;   Fair  Judgement:  Fair  Insight:  Fair  Psychomotor Activity:  Decreased  Concentration:  Fair  Recall:  [employer] of Knowledge: Fair  Language: Fair  Akathisia:  No  Handed:  Right  AIMS (if indicated):    Assets:  Communication Skills Desire for Improvement Physical Health Social Support  ADL's:  Intact  Cognition: WNL  Sleep:  6-7   Is the patient at risk to self?  No. Has the patient been a risk to self in the past 6 months?  No. Has the patient been a risk to self within the distant past?  No. Is the patient a risk to others?  No. Has the patient been a risk to others in the past 6 months?  No. Has the patient been a risk to others within the distant past?  No.  Current Medications: Current Outpatient Medications  Medication Sig Dispense Refill  . albuterol (PROVENTIL HFA;VENTOLIN HFA) 108 (90 Base) MCG/ACT inhaler Inhale 2 puffs into the lungs every 4 (four) hours as needed for wheezing or shortness of breath. 1 Inhaler 3  . albuterol (PROVENTIL) (2.5 MG/3ML) 0.083% nebulizer solution Take 2.5 mg by nebulization every 4 (four) hours as needed for wheezing or shortness of breath.    Marland Kitchen amLODipine (NORVASC) 5 MG tablet TAKE 1 TABLET BY MOUTH EVERY DAY 90 tablet 1  . bisacodyl (DULCOLAX) 5 MG EC tablet Take by mouth.    . budesonide-formoterol (SYMBICORT) 160-4.5 MCG/ACT inhaler  Inhale 2 puffs into the lungs 2 (two) times daily. 1 Inhaler 12  . diazepam (VALIUM) 5 MG tablet Take 5 mg by mouth 3 (three) times daily as needed.      . docusate sodium (COLACE) 100 MG capsule Take 100 mg by mouth 2 (two) times daily.    Marland Kitchen GRALISE 600 MG TABS     . hydrochlorothiazide (HYDRODIURIL) 25 MG tablet Take 25 mg by mouth daily.  11  . metoprolol tartrate (LOPRESSOR) 50 MG tablet TAKE 1/2 TABLET BY MOUTH TWICE A DAY (NEED OFFICE VISIT) 90 tablet 0  . mometasone (NASONEX) 50 MCG/ACT nasal spray Place into the nose.    . Multiple Vitamin (MULTIVITAMIN) capsule Take 1 capsule by mouth daily.      . Omega-3 Fatty Acids (FISH OIL) 1200 MG CAPS Take 1 capsule by mouth daily.    Dellia Nims CALCIUM + D3 500-200 MG-UNIT TABS TAKE 2 TABLETS BY MOUTH 2 (TWO) TIMES DAILY.  6  . Oxycodone HCl 10 MG TABS Take 10 mg by mouth 3 (three) times daily as needed.  0  . potassium chloride (K-DUR) 10 MEQ tablet TAKE 1 TABLET BY MOUTH TWICE A DAY 60 tablet 0  . pravastatin (PRAVACHOL) 10 MG tablet TAKE 1 TABLET (10 MG TOTAL) BY MOUTH DAILY. 90 tablet 2  . RELISTOR 150 MG TABS TAKE 3 TABLETS BY MOUTH ONCE A DAY prn  2  . TIROSINT 137 MCG CAPS TAKE 1 CAPSULE BY MOUTH DAILY BEFORE BREAKFAST. 30 capsule 2  . venlafaxine XR (EFFEXOR-XR) 150 MG 24 hr capsule Take 1 capsule (150 mg total) by mouth daily with breakfast. 30 capsule 1  . XTAMPZA ER 36 MG C12A TAKE 1 CAPSULE BY MOUTH EVERY 12 HOURS  0  . zolpidem (AMBIEN) 10 MG tablet Take 10 mg by mouth at bedtime as needed.       No current facility-administered medications for this visit.      Medical Decision Making:  Review of Psycho-Social Stressors (1)  Treatment Plan Summary:Medication management   I reviewed all the medications with the patient and her husband. She is getting Valium and Ambien from the [place] [facility].  Patient continues to appear sedated under the influence of these medications and is not willing to have her medications  adjusted     Effexor XR 150 mg p.o. every morning.  Continue other medications as prescribed..  Patient to call the office if she notices worsening of her symptoms.  She will follow-up in 2 months or earlier depending on her symptoms.       More than 50% of the time spent in psychoeducation, counseling and coordination of care.    This note was generated in part or whole with voice recognition software. Voice regonition is usually quite accurate but there are transcription errors that can and very often do occur. I apologize for any typographical errors that were not detected and corrected.     Rainey Pines, MD    09/24/2018, 3:37 PM

## 2018-09-28 ENCOUNTER — Other Ambulatory Visit: Payer: Self-pay | Admitting: Internal Medicine

## 2018-10-07 ENCOUNTER — Other Ambulatory Visit: Payer: Self-pay | Admitting: Internal Medicine

## 2018-10-15 ENCOUNTER — Other Ambulatory Visit: Payer: Self-pay | Admitting: Internal Medicine

## 2018-10-17 ENCOUNTER — Ambulatory Visit: Payer: Medicare Other

## 2018-10-18 DIAGNOSIS — G4700 Insomnia, unspecified: Secondary | ICD-10-CM | POA: Diagnosis not present

## 2018-10-18 DIAGNOSIS — G894 Chronic pain syndrome: Secondary | ICD-10-CM | POA: Diagnosis not present

## 2018-10-18 DIAGNOSIS — M62830 Muscle spasm of back: Secondary | ICD-10-CM | POA: Diagnosis not present

## 2018-10-18 DIAGNOSIS — M47817 Spondylosis without myelopathy or radiculopathy, lumbosacral region: Secondary | ICD-10-CM | POA: Diagnosis not present

## 2018-10-24 ENCOUNTER — Other Ambulatory Visit: Payer: Self-pay | Admitting: Internal Medicine

## 2018-10-24 ENCOUNTER — Telehealth: Payer: Self-pay | Admitting: Internal Medicine

## 2018-10-24 NOTE — Telephone Encounter (Signed)
Pt husband came in stating that pt needs a prior auth for medication TIROSINT 137 MCG CAPS. Pt has no pills left.   Pharmacy is [id number] - [place], [place].  Call husband @ [phone]. Thank you!

## 2018-10-24 NOTE — Telephone Encounter (Signed)
Spoke with [facility] and gave info. She stated that it is showing PA has not been done. Pharmacy is going to keep refreshing and let them know when ready. PA has been been approved on cover my meds.

## 2018-10-24 NOTE — Telephone Encounter (Signed)
Husband calling and states that the pharmacy is needing proof of the prior authorization being approved. Please advise.

## 2018-10-24 NOTE — Telephone Encounter (Signed)
PA submitted and approved. Husband aware

## 2018-10-25 ENCOUNTER — Other Ambulatory Visit: Payer: Self-pay

## 2018-10-25 ENCOUNTER — Ambulatory Visit
Admission: RE | Admit: 2018-10-25 | Discharge: 2018-10-25 | Disposition: A | Discharge status: Home / Self Care | Payer: Medicare Other | Source: Ambulatory Visit | Attending: Physical Medicine and Rehabilitation | Admitting: Physical Medicine and Rehabilitation

## 2018-10-25 DIAGNOSIS — M25511 Pain in right shoulder: Secondary | ICD-10-CM | POA: Diagnosis not present

## 2018-10-25 DIAGNOSIS — R200 Anesthesia of skin: Secondary | ICD-10-CM | POA: Diagnosis not present

## 2018-10-25 DIAGNOSIS — R202 Paresthesia of skin: Secondary | ICD-10-CM | POA: Diagnosis not present

## 2018-11-12 ENCOUNTER — Other Ambulatory Visit: Payer: Self-pay | Admitting: Psychiatry

## 2018-11-15 DIAGNOSIS — M47817 Spondylosis without myelopathy or radiculopathy, lumbosacral region: Secondary | ICD-10-CM | POA: Diagnosis not present

## 2018-11-15 DIAGNOSIS — M62830 Muscle spasm of back: Secondary | ICD-10-CM | POA: Diagnosis not present

## 2018-11-15 DIAGNOSIS — G4700 Insomnia, unspecified: Secondary | ICD-10-CM | POA: Diagnosis not present

## 2018-11-15 DIAGNOSIS — G894 Chronic pain syndrome: Secondary | ICD-10-CM | POA: Diagnosis not present

## 2018-11-17 ENCOUNTER — Other Ambulatory Visit: Payer: Self-pay | Admitting: Internal Medicine

## 2018-11-19 ENCOUNTER — Other Ambulatory Visit: Payer: Self-pay | Admitting: Psychiatry

## 2018-11-19 ENCOUNTER — Other Ambulatory Visit: Payer: Self-pay

## 2018-11-19 ENCOUNTER — Ambulatory Visit: Payer: Medicare Other | Admitting: Psychiatry

## 2018-11-19 ENCOUNTER — Encounter: Payer: Self-pay | Admitting: Psychiatry

## 2018-11-19 NOTE — Progress Notes (Unsigned)
Tc on  11-19-18 @ 1:45 pt medical and surgical hx was reviewed with no changes. pt allergies were reviewed with no changes. Pt medications and pharmacy were reviewed and no changes. Vital were not done due to this was a phone consult.

## 2018-11-27 ENCOUNTER — Telehealth: Payer: Self-pay

## 2018-11-27 DIAGNOSIS — F411 Generalized anxiety disorder: Secondary | ICD-10-CM

## 2018-11-27 MED ORDER — VENLAFAXINE HCL ER 150 MG PO CP24: 150.0000 mg | ORAL_CAPSULE | Freq: Every day | ORAL | 1 refills | Status: DC

## 2018-11-27 NOTE — Telephone Encounter (Signed)
Sent effexor to pharmacy

## 2018-11-27 NOTE — Telephone Encounter (Signed)
pt husband called states that his wife needs enough medications to get to her next appt with dr. Gretel Acre. pt last seen on  09-24-18 next appt  12-31-18. needs venlafaxine xr

## 2018-12-01 ENCOUNTER — Other Ambulatory Visit: Payer: Self-pay | Admitting: Psychiatry

## 2018-12-12 ENCOUNTER — Other Ambulatory Visit: Payer: Self-pay | Admitting: Internal Medicine

## 2018-12-13 DIAGNOSIS — G4700 Insomnia, unspecified: Secondary | ICD-10-CM | POA: Diagnosis not present

## 2018-12-13 DIAGNOSIS — M47817 Spondylosis without myelopathy or radiculopathy, lumbosacral region: Secondary | ICD-10-CM | POA: Diagnosis not present

## 2018-12-13 DIAGNOSIS — G894 Chronic pain syndrome: Secondary | ICD-10-CM | POA: Diagnosis not present

## 2018-12-13 DIAGNOSIS — M62830 Muscle spasm of back: Secondary | ICD-10-CM | POA: Diagnosis not present

## 2018-12-19 ENCOUNTER — Other Ambulatory Visit: Payer: Self-pay | Admitting: Internal Medicine

## 2018-12-31 ENCOUNTER — Ambulatory Visit (INDEPENDENT_AMBULATORY_CARE_PROVIDER_SITE_OTHER): Payer: Medicare Other | Admitting: Psychiatry

## 2018-12-31 ENCOUNTER — Encounter: Payer: Self-pay | Admitting: Psychiatry

## 2018-12-31 ENCOUNTER — Other Ambulatory Visit: Payer: Self-pay

## 2018-12-31 DIAGNOSIS — F411 Generalized anxiety disorder: Secondary | ICD-10-CM

## 2018-12-31 DIAGNOSIS — F331 Major depressive disorder, recurrent, moderate: Secondary | ICD-10-CM | POA: Diagnosis not present

## 2018-12-31 MED ORDER — VENLAFAXINE HCL ER 150 MG PO CP24: 150.0000 mg | ORAL_CAPSULE | Freq: Every day | ORAL | 1 refills | Status: DC

## 2018-12-31 NOTE — Progress Notes (Signed)
Patient ID: [name], female   DOB: [birth year], 58 y.o.   MRN: [id number]  Patient is a 58 year old female with history of depression who was seen for follow-up. She reported that she has been doing well and is staying at home. She reported that her husband will go out . She stated that she has been compliant with her medications. She stated that she has been to her pain management doctor twice since the Covid. She stated that she does not take any other medications besides the one which are prescribed to her. She is stable on the venlafaxine. She denied having any suicidal or homicidal ideations or plans. No other acute symptoms noted at this time.  Plan. Continue and venlafaxine 150 g PO QAM. I discussed the assessment and treatment plan with the patient. The patient was provided an opportunity to ask questions and all were answered. The patient agreed with the plan and demonstrated an understanding of the instructions.   The patient was advised to call back or seek an in-person evaluation if the symptoms worsen or if the condition fails to improve as anticipated.   I provided 15 minutes of non-face-to-face time during this encounter.

## 2019-01-06 ENCOUNTER — Other Ambulatory Visit: Payer: Self-pay | Admitting: Internal Medicine

## 2019-01-07 ENCOUNTER — Other Ambulatory Visit: Payer: Self-pay | Admitting: Internal Medicine

## 2019-01-10 DIAGNOSIS — G894 Chronic pain syndrome: Secondary | ICD-10-CM | POA: Diagnosis not present

## 2019-01-10 DIAGNOSIS — G4700 Insomnia, unspecified: Secondary | ICD-10-CM | POA: Diagnosis not present

## 2019-01-10 DIAGNOSIS — M47817 Spondylosis without myelopathy or radiculopathy, lumbosacral region: Secondary | ICD-10-CM | POA: Diagnosis not present

## 2019-01-10 DIAGNOSIS — M62830 Muscle spasm of back: Secondary | ICD-10-CM | POA: Diagnosis not present

## 2019-01-14 ENCOUNTER — Encounter: Payer: Self-pay | Admitting: Pulmonary Disease

## 2019-01-14 ENCOUNTER — Ambulatory Visit (INDEPENDENT_AMBULATORY_CARE_PROVIDER_SITE_OTHER): Payer: Medicare Other | Admitting: Pulmonary Disease

## 2019-01-14 DIAGNOSIS — J4541 Moderate persistent asthma with (acute) exacerbation: Secondary | ICD-10-CM | POA: Diagnosis not present

## 2019-01-14 DIAGNOSIS — R918 Other nonspecific abnormal finding of lung field: Secondary | ICD-10-CM

## 2019-01-14 DIAGNOSIS — D721 Eosinophilia, unspecified: Secondary | ICD-10-CM

## 2019-01-14 MED ORDER — PREDNISONE 20 MG PO TABS: 40.0000 mg | ORAL_TABLET | Freq: Every day | ORAL | 0 refills | Status: AC

## 2019-01-14 MED ORDER — BUDESONIDE-FORMOTEROL FUMARATE 160-4.5 MCG/ACT IN AERO: 2.0000 | INHALATION_SPRAY | Freq: Two times a day (BID) | RESPIRATORY_TRACT | 12 refills | Status: DC

## 2019-01-14 MED ORDER — ALBUTEROL SULFATE HFA 108 (90 BASE) MCG/ACT IN AERS: 2.0000 | INHALATION_SPRAY | RESPIRATORY_TRACT | 3 refills | Status: DC | PRN

## 2019-01-14 NOTE — Patient Instructions (Signed)
Prednisone 40 mg (2 x 20 mg) daily for 5 days.  Prescription entered Resume Symbicort 160-4.5, 2 actuations twice a day.  Rinse mouth after use.  Prescription entered Resume albuterol inhaler as needed for increased shortness of breath, wheezing, chest tightness, cough.  Prescription entered Follow-up in 6 weeks with PFTs (lung function test) and CXR prior to that visit.  Orders for these tests have been entered

## 2019-01-15 NOTE — Progress Notes (Signed)
PROBLEMS: Dermatomyositis Asthma Evanescent pulmonary infiltrates  DATA: 03/17/11 CT chest: Atelectasis versus infiltrate within the superior segments of the right upper and right lower lobes 11/16/15: Patchy infiltrate left upper lobe consistent with pneumonia 01/14/16 CXR: Patchy alveolar opacities in the left upper lobe 04/07/16 PFTs: mod-severe obstruction with substantial improvement after bronchodilator (FEV1 1.37 > 1.83 liters, 46-62% pred), mild restriction, normal DLCO 04/07/16 6MWT: 192 meters. More limited by LE weakness 04/20/16 HRCT chest: Extensive air trapping, indicating small airways disease. Mild diffuse bronchial wall thickening. Mild patchy regions of ground-glass attenuation in the mid to upper lungs bilaterally. 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 04/25/16 Office spiro: FEV1 2.15 liters (73%) 07/12/16 Respiratory symptoms deteriorated gradually since last course of prednisone. Progressive SOB, increased wheezing and rescue inhaler use X 2-3 weeks.   INTERVAL HISTORY: Last office visit 07/14/17  Virtual Visit via Telephone Note  I connected with [name] on 01/15/19 at  9:45 AM EDT by telephone and verified that I am speaking with the correct person using two identifiers. I discussed the limitations, risks, security and privacy concerns of performing an evaluation and management service by telephone and the availability of in person appointments. I also discussed with the patient that there may be a patient responsible charge related to this service. The patient expressed understanding and agreed to proceed. I discussed the assessment and treatment plan with the patient. The patient was provided an opportunity to ask questions and all were answered. The patient agreed with the plan and demonstrated an understanding of the instructions. The patient was advised to call back or seek an in-person evaluation if the symptoms worsen or  if the condition fails to improve as anticipated.   SUBJ: She was last seen 07/14/2017.  She failed to follow-up due to debilitating depression.  She asked for this (virtual) visit due to persistent symptoms of shortness of breath.  In her words she has been "sick since before Christmas".  She has symptoms of cough, shortness of breath, sputum production.  At one point she was treated with prednisone and antibiotics with improvement in the symptoms.  She was previously on a daily dose of prednisone for the problems listed above.  She has not been on chronic prednisone since winter or spring 2019.  After coming off the prednisone she remained on Symbicort inhaler and had generally good control of respiratory symptoms without requirement for her rescue inhaler.  She has now been off of Symbicort for approximately 3 months.  She is requiring albuterol inhaler and has purchased Primatene inhaler over-the-counter for symptoms of wheezing, shortness of breath, chest rattling.  She denies fever, hemoptysis, purulent sputum, pleuritic chest pain, lower extremity edema, calf tenderness.   OBJ: There were no vitals filed for this visit.  EXAM: Due to the remote nature of this encounter, physical examination cannot be performed.  The patient was not overtly dyspneic during our telephone encounter.  DATA BMP Latest Ref Rng & Units 07/23/2018 02/23/2018 01/23/2018  Glucose 70 - 99 mg/dL 102(H) - -  BUN 6 - 20 mg/dL 12 - -  Creatinine 0.44 - 1.00 mg/dL 0.82 - -  Sodium 135 - 145 mmol/L 138 - -  Potassium 3.5 - 5.1 mmol/L 2.9(L) 4.1 3.8  Chloride 98 - 111 mmol/L 100 - -  CO2 22 - 32 mmol/L 30 - -  Calcium 8.9 - 10.3 mg/dL 8.6(L) - -   CBC Latest Ref Rng & Units 07/23/2018 12/26/2016 11/08/2016  WBC 4.0 -  10.5 K/uL 6.8 9.8 9.2  Hemoglobin 12.0 - 15.0 g/dL 14.7 14.6 13.6  Hematocrit 36.0 - 46.0 % 43.1 43.6 39.1  Platelets 150 - 400 K/uL 349 299 254     CXR: No new film   IMPRESSION: 1) H/O  dermatomyositis 2) History of recurrent "pneumonias" (evanescent pulmonary infiltrates) 3) Moderate persistent asthma with symptoms of acute exacerbation  Her symptoms have clearly worsened since discontinuation of Symbicort inhaler 4) prior history of mild eosinophilia   PLAN: Prednisone 40 mg daily for 5 days.  Prescription entered Resume Symbicort 160-4.5, 2 actuations twice daily.  Rinse mouth after use.  Prescription entered Resume albuterol inhaler as needed Cautioned against the use of Primatene (which I regarded as a dangerous medication) Follow-up in 6 weeks with PFTs and CXR prior to that visit.  Orders for these tests have been entered   Merton Border, MD PCCM service Mobile [phone] Pager [phone] 01/15/2019 1:05 PM

## 2019-02-02 ENCOUNTER — Other Ambulatory Visit: Payer: Self-pay | Admitting: Internal Medicine

## 2019-02-07 DIAGNOSIS — M62830 Muscle spasm of back: Secondary | ICD-10-CM | POA: Diagnosis not present

## 2019-02-07 DIAGNOSIS — G4700 Insomnia, unspecified: Secondary | ICD-10-CM | POA: Diagnosis not present

## 2019-02-07 DIAGNOSIS — G894 Chronic pain syndrome: Secondary | ICD-10-CM | POA: Diagnosis not present

## 2019-02-07 DIAGNOSIS — Z79891 Long term (current) use of opiate analgesic: Secondary | ICD-10-CM | POA: Diagnosis not present

## 2019-02-07 DIAGNOSIS — M47817 Spondylosis without myelopathy or radiculopathy, lumbosacral region: Secondary | ICD-10-CM | POA: Diagnosis not present

## 2019-02-11 ENCOUNTER — Other Ambulatory Visit: Payer: Self-pay

## 2019-02-13 ENCOUNTER — Other Ambulatory Visit: Payer: Self-pay

## 2019-02-15 ENCOUNTER — Other Ambulatory Visit
Admission: RE | Admit: 2019-02-15 | Discharge: 2019-02-15 | Disposition: A | Discharge status: Home / Self Care | Payer: Medicare Other | Source: Ambulatory Visit | Attending: Pulmonary Disease | Admitting: Pulmonary Disease

## 2019-02-15 ENCOUNTER — Other Ambulatory Visit: Payer: Self-pay

## 2019-02-15 DIAGNOSIS — Z1159 Encounter for screening for other viral diseases: Secondary | ICD-10-CM | POA: Diagnosis not present

## 2019-02-16 LAB — SARS CORONAVIRUS 2 (TAT 6-24 HRS): SARS Coronavirus 2: NEGATIVE

## 2019-02-19 ENCOUNTER — Ambulatory Visit: Payer: Medicare Other

## 2019-02-24 ENCOUNTER — Other Ambulatory Visit: Payer: Self-pay | Admitting: Psychiatry

## 2019-02-24 DIAGNOSIS — F411 Generalized anxiety disorder: Secondary | ICD-10-CM

## 2019-02-25 ENCOUNTER — Encounter: Payer: Self-pay | Admitting: Psychiatry

## 2019-02-25 ENCOUNTER — Other Ambulatory Visit: Payer: Self-pay

## 2019-02-25 ENCOUNTER — Ambulatory Visit (INDEPENDENT_AMBULATORY_CARE_PROVIDER_SITE_OTHER): Payer: Medicare Other | Admitting: Psychiatry

## 2019-02-25 DIAGNOSIS — F418 Other specified anxiety disorders: Secondary | ICD-10-CM | POA: Diagnosis not present

## 2019-02-25 DIAGNOSIS — F331 Major depressive disorder, recurrent, moderate: Secondary | ICD-10-CM

## 2019-02-25 DIAGNOSIS — F411 Generalized anxiety disorder: Secondary | ICD-10-CM

## 2019-02-25 MED ORDER — VENLAFAXINE HCL ER 150 MG PO CP24: 150.0000 mg | ORAL_CAPSULE | Freq: Every day | ORAL | 1 refills | Status: DC

## 2019-02-25 NOTE — Progress Notes (Signed)
Patient ID: [name], female   DOB: 19-Jan-1961, 59 y.o.   MRN: [id number]  Patient is a 58 year old female with history of depression anxiety who was evaluated for follow-up. She reported that she has been doing well and has been compliant with her medications. She reported that she has been following with the pain management on a regular basis and is currently getting her Valium for muscle spasms and Ambien for sleep.she is also taking venlafaxine on a regular basis for depression in the morning. She reported that she is trying to stay safe and is doing her safety precautions. She stated that she does not have any suicidal ideations or plans. She appeared calm during the interview. No acute symptoms noted. She denied having any perceptual disturbances. She denied using any drugs or alcohol at this time.

## 2019-02-27 ENCOUNTER — Ambulatory Visit: Payer: Medicare Other | Admitting: Pulmonary Disease

## 2019-03-01 ENCOUNTER — Other Ambulatory Visit: Payer: Self-pay | Admitting: Internal Medicine

## 2019-03-01 NOTE — Telephone Encounter (Signed)
LMTCB. Needs appt. °

## 2019-03-04 ENCOUNTER — Other Ambulatory Visit: Payer: Self-pay

## 2019-03-04 ENCOUNTER — Telehealth: Payer: Self-pay | Admitting: Pulmonary Disease

## 2019-03-04 NOTE — Telephone Encounter (Signed)
Rhonda please advise. Thanks 

## 2019-03-04 NOTE — Telephone Encounter (Signed)
First available PFT is Tues 03/12/2019 at 4:00 arrival time.  Pt will need to have COVID Test prior. Rhonda J Cobb

## 2019-03-04 NOTE — Telephone Encounter (Signed)
Pt is aware of below date and time of PFT. Also made pt aware of date/time of covid test.  Pt voiced her understanding and had no further questions. Nothing further is needed.

## 2019-03-05 NOTE — Telephone Encounter (Signed)
Seems like encounter was open in error due to no documentation. Closing encounter.

## 2019-03-07 DIAGNOSIS — G894 Chronic pain syndrome: Secondary | ICD-10-CM | POA: Diagnosis not present

## 2019-03-07 DIAGNOSIS — G4700 Insomnia, unspecified: Secondary | ICD-10-CM | POA: Diagnosis not present

## 2019-03-07 DIAGNOSIS — M47817 Spondylosis without myelopathy or radiculopathy, lumbosacral region: Secondary | ICD-10-CM | POA: Diagnosis not present

## 2019-03-07 DIAGNOSIS — M62830 Muscle spasm of back: Secondary | ICD-10-CM | POA: Diagnosis not present

## 2019-03-08 ENCOUNTER — Other Ambulatory Visit: Payer: Self-pay | Admitting: Internal Medicine

## 2019-03-08 ENCOUNTER — Other Ambulatory Visit: Admission: RE | Admit: 2019-03-08 | Payer: Medicare Other | Source: Ambulatory Visit

## 2019-03-08 NOTE — Telephone Encounter (Signed)
LMTCB

## 2019-03-11 ENCOUNTER — Ambulatory Visit: Payer: Medicare Other | Admitting: Pulmonary Disease

## 2019-03-12 ENCOUNTER — Other Ambulatory Visit: Payer: Self-pay

## 2019-03-12 ENCOUNTER — Ambulatory Visit: Payer: Medicare Other

## 2019-03-12 ENCOUNTER — Other Ambulatory Visit
Admission: RE | Admit: 2019-03-12 | Discharge: 2019-03-12 | Disposition: A | Discharge status: Home / Self Care | Payer: Medicare Other | Source: Ambulatory Visit | Attending: Pulmonary Disease | Admitting: Pulmonary Disease

## 2019-03-12 DIAGNOSIS — Z20828 Contact with and (suspected) exposure to other viral communicable diseases: Secondary | ICD-10-CM | POA: Insufficient documentation

## 2019-03-13 ENCOUNTER — Ambulatory Visit (INDEPENDENT_AMBULATORY_CARE_PROVIDER_SITE_OTHER): Payer: Medicare Other

## 2019-03-13 ENCOUNTER — Telehealth: Payer: Self-pay

## 2019-03-13 DIAGNOSIS — Z0000 Encounter for general adult medical examination without abnormal findings: Secondary | ICD-10-CM | POA: Diagnosis not present

## 2019-03-13 LAB — SARS CORONAVIRUS 2 (TAT 6-24 HRS): SARS Coronavirus 2: NEGATIVE

## 2019-03-13 NOTE — Telephone Encounter (Signed)
Attempt to call patient for scheduled annual wellness and health maintenance update for her doctor. No answer. No voicemail. Please reschedule as appropriate with call back.

## 2019-03-13 NOTE — Progress Notes (Signed)
Subjective:   [name] is a 58 y.o. female who presents for an Initial Medicare Annual Wellness Visit.  Review of Systems    No ROS.  Medicare Wellness Virtual Visit.  Visual/audio telehealth visit, UTA vital signs.   See social history for additional risk factors.  Cardiac Risk Factors include: hypertension     Objective:    Today's Vitals   There is no height or weight on file to calculate BMI.  Advanced Directives 03/13/2019 07/23/2018 11/08/2016 06/06/2016 06/06/2016 05/30/2016 11/16/2015  Does Patient Have a Medical Advance Directive? No No No No No No No  Would patient like information on creating a medical advance directive? No - Patient declined - No - Patient declined No - patient declined information No - patient declined information No - patient declined information -  Some encounter information is confidential and restricted. Go to Review Flowsheets activity to see all data.    Current Medications (verified) Outpatient Encounter Medications as of 03/13/2019  Medication Sig  . albuterol (PROVENTIL) (2.5 MG/3ML) 0.083% nebulizer solution Take 2.5 mg by nebulization every 4 (four) hours as needed for wheezing or shortness of breath.  Marland Kitchen albuterol (VENTOLIN HFA) 108 (90 Base) MCG/ACT inhaler Inhale 2 puffs into the lungs every 4 (four) hours as needed for wheezing or shortness of breath.  Marland Kitchen amLODipine (NORVASC) 5 MG tablet TAKE 1 TABLET BY MOUTH EVERY DAY  . bisacodyl (DULCOLAX) 5 MG EC tablet Take by mouth.  . budesonide-formoterol (SYMBICORT) 160-4.5 MCG/ACT inhaler Inhale 2 puffs into the lungs 2 (two) times daily.  . diazepam (VALIUM) 5 MG tablet Take 5 mg by mouth 3 (three) times daily as needed.    . docusate sodium (COLACE) 100 MG capsule Take 100 mg by mouth 2 (two) times daily.  Marland Kitchen GRALISE 600 MG TABS   . hydrochlorothiazide (HYDRODIURIL) 25 MG tablet Take 25 mg by mouth daily.  . metoprolol tartrate (LOPRESSOR) 50 MG tablet TAKE 1/2 TABLET BY MOUTH TWICE A DAY  (NEED OFFICE VISIT)  . mometasone (NASONEX) 50 MCG/ACT nasal spray Place into the nose.  . Multiple Vitamin (MULTIVITAMIN) capsule Take 1 capsule by mouth daily.    . Omega-3 Fatty Acids (FISH OIL) 1200 MG CAPS Take 1 capsule by mouth daily.  Dellia Nims CALCIUM + D3 500-200 MG-UNIT TABS TAKE 2 TABLETS BY MOUTH 2 (TWO) TIMES DAILY.  Marland Kitchen Oxycodone HCl 10 MG TABS Take 10 mg by mouth 3 (three) times daily as needed.  . potassium chloride (K-DUR) 10 MEQ tablet TAKE 1 TABLET BY MOUTH TWICE A DAY  . pravastatin (PRAVACHOL) 10 MG tablet TAKE 1 TABLET (10 MG TOTAL) BY MOUTH DAILY.  Marland Kitchen RELISTOR 150 MG TABS TAKE 3 TABLETS BY MOUTH ONCE A DAY prn  . TIROSINT 137 MCG CAPS TAKE 1 CAPSULE BY MOUTH DAILY BEFORE BREAKFAST.  Marland Kitchen venlafaxine XR (EFFEXOR-XR) 150 MG 24 hr capsule Take 1 capsule (150 mg total) by mouth daily with breakfast.  . XTAMPZA ER 36 MG C12A TAKE 1 CAPSULE BY MOUTH EVERY 12 HOURS  . zolpidem (AMBIEN) 10 MG tablet Take 10 mg by mouth at bedtime as needed.    . [DISCONTINUED] triamcinolone (NASACORT AQ) 55 MCG/ACT AERO nasal inhaler Place 2 sprays into the nose 2 (two) times daily. 1 sprays each nostril twice a day   No facility-administered encounter medications on file as of 03/13/2019.     Allergies (verified) Fentanyl, Sumatriptan, Doxycycline, Montelukast, Oxymorphone, Benzoin, Buspirone, Ciprofloxacin, Conjugated estrogens, Cymbalta [duloxetine hcl], Duloxetine, Erythromycin ethylsuccinate,  Estradiol, Metronidazole, and Mirtazapine   History: Past Medical History:  Diagnosis Date  . Allergic rhinitis   . Anxiety   . Arthralgia   . Asthma   . Bronchitis, chronic ([place])   . Chronic back pain   . Collagenous colitis    followed by Dr Tiffany Kocher  . Depression   . Diplopia 11/15/2013  . Dysphagia   . Endometriosis   . Fibrocystic breast disease   . GERD (gastroesophageal reflux disease)   . History of colon polyps   . Hyperlipidemia   . Hypertension   . Hypokalemia   . IBS (irritable  bowel syndrome)   . Memory difficulty 11/15/2013  . Migraine headache   . Pneumonia 2017   x2  . PONV (postoperative nausea and vomiting)    used patch  . Sleep apnea    recommended CPAP  . Ulcer disease   . Urine incontinence    Past Surgical History:  Procedure Laterality Date  . ABDOMINAL HYSTERECTOMY     endometriosis  . BACK SURGERY     x6  . BREAST BIOPSY Right   . COLONOSCOPY WITH PROPOFOL N/A 06/25/2015   Procedure: COLONOSCOPY WITH PROPOFOL;  Surgeon: Manya Silvas, MD;  Location: [facility] ENDOSCOPY;  Service: Endoscopy;  Laterality: N/A;  . DILATION AND CURETTAGE OF UTERUS     and hysteroscopy  . ESOPHAGOGASTRODUODENOSCOPY N/A 06/25/2015   Procedure: ESOPHAGOGASTRODUODENOSCOPY (EGD);  Surgeon: Manya Silvas, MD;  Location: [facility] ENDOSCOPY;  Service: Endoscopy;  Laterality: N/A;  . insertion of permanent spinal cord stimulator    . insertion of trial spinal cord stimulator    . LUMBAR DISC SURGERY    . LUMBAR FUSION  4/05 and 8/07  . LUMBAR MICRODISCECTOMY  03/28/03   L4-5 L5  . MOUTH SURGERY    . NASAL SEPTOPLASTY W/ TURBINOPLASTY    . removal of spinal cord stimulator    . SEPTOPLASTY     with reduction of turbinates  . THYROIDECTOMY N/A 06/06/2016   Procedure: THYROIDECTOMY;  Surgeon: Robert Bellow, MD;  Location: [facility] ORS;  Service: General;  Laterality: N/A;   Family History  Problem Relation Age of Onset  . Allergies Mother   . Hypertension Mother   . Arthritis Mother   . Hyperlipidemia Mother   . Stroke Mother   . Hypertension Father   . Arthritis Father   . Diabetes Father   . Hypertension Brother   . Diabetes Brother   . Arthritis Maternal Grandmother   . Hyperlipidemia Maternal Grandmother   . Hyperlipidemia Maternal Grandfather   . Arthritis Paternal Grandmother   . Arthritis Paternal Grandfather   . Asthma Other        several family member  . Leukemia Other        uncle  . Lymphoma Maternal Aunt        non hodgkins   Social  History   Socioeconomic History  . Marital status: Married    Spouse name: douglas  . Number of children: 1  . Years of education: 6 th  . Highest education level: High school graduate  Occupational History  . Occupation: Disabled    Employer: DISABLED  Social Needs  . Financial resource strain: Not hard at all  . Food insecurity    Worry: Never true    Inability: Never true  . Transportation needs    Medical: No    Non-medical: No  Tobacco Use  . Smoking status: Never Smoker  . Smokeless tobacco:  Never Used  Substance and Sexual Activity  . Alcohol use: No    Alcohol/week: 0.0 standard drinks  . Drug use: No  . Sexual activity: Not Currently  Lifestyle  . Physical activity    Days per week: 0 days    Minutes per session: 0 min  . Stress: Very much  Relationships  . Social Herbalist on phone: Three times a week    Gets together: Never    Attends religious service: More than 4 times per year    Active member of club or organization: No    Attends meetings of clubs or organizations: Never    Relationship status: Not on file  Other Topics Concern  . Not on file  Social History Narrative  . Not on file    Tobacco Counseling Counseling given: Not Answered   Clinical Intake:  Pre-visit preparation completed: Yes        Diabetes: No  How often do you need to have someone help you when you read instructions, pamphlets, or other written materials from your doctor or pharmacy?: 1 - Never  Interpreter Needed?: No      Activities of Daily Living In your present state of health, do you have any difficulty performing the following activities: 03/13/2019 07/23/2018  Hearing? N N  Vision? N N  Difficulty concentrating or making decisions? Y N  Comment Difficulty focusing -  Walking or climbing stairs? Y Y  Comment Unsteady gait. Walker in use when ambulating. Uses a walker due to back pain  Dressing or bathing? N N  Doing errands, shopping? Y -   Comment She does not drive Facilities manager and eating ? Y -  Comment Husband meal preps. Self feeds. -  Using the Toilet? N -  In the past six months, have you accidently leaked urine? Y -  Comment Managed with daily brief -  Do you have problems with loss of bowel control? N -  Managing your Medications? Y -  Comment Husband manages -  Managing your Finances? Y -  Comment Husband manages -  Housekeeping or managing your Housekeeping? Y -  Comment Sister assists -  Some recent data might be hidden     Immunizations and Health Maintenance Immunization History  Administered Date(s) Administered  . Influenza Inj Mdck Quad Pf 07/14/2017  . Influenza Whole 07/14/2006, 05/27/2010  . Influenza,inj,Quad PF,6+ Mos 05/28/2013, 06/07/2016  . Influenza-Unspecified 08/22/2012, 05/28/2013  . Pneumococcal Polysaccharide-23 06/07/2016   There are no preventive care reminders to display for this patient.  Patient Care Team: Einar Pheasant, MD as PCP - General (Internal Medicine) Margaretha Sheffield, MD as Referring Physician (Physical Medicine and Rehabilitation) Margaretha Sheffield, MD as Referring Physician (Physical Medicine and Rehabilitation) Bary Castilla Forest Gleason, MD (General Surgery)  Indicate any recent Medical Services you may have received from other than [facility] providers in the past year (date may be approximate).     Assessment:   This is a routine wellness examination for [facility]  I connected with patient 03/13/19 at 10:30 AM EDT by an audio enabled telemedicine application and verified that I am speaking with the correct person using two identifiers. Patient stated full name and DOB. Patient gave permission to continue with virtual visit. Patient's location was at home and Nurse's location was at [place] office.   Health Screenings  Mammogram - 01/2018 Colonoscopy - 06/2015 Glaucoma -none Hearing -demonstrates normal hearing during visit. Labs followed by pcp Cholesterol - 12/2017  Hepatitis C screening- deferred per patient preference to follow up with pcp.  Dental- UTD Vision- visits within the last 12 months  Social  Alcohol intake - no       Smoking history- never    Smokers in home? none Illicit drug use? none Exercise - bed arm/leg exercises Diet - regular. States, I don't like healthy food". Encouraged to eat a healthy diet.  Sexually Active -not currently BMI- discussed the importance of a healthy diet, water intake and the benefits of aerobic exercise.  Educational material provided.   Safety  Patient feels safe at home- yes Patient does have smoke detectors at home- yes Patient does wear sunscreen or protective clothing when in direct sunlight -yes Patient does wear seat belt when in a moving vehicle -yes Patient drives- no  [id number] precautions and sickness symptoms discussed.   Activities of Daily Living Patient denies needing assistance with: feeding themselves, getting from bed to chair, getting to the toilet, bathing/showering, dressing.  Husband manages the home and assists with ADLs as needed.    Depression Screen Patient denies losing interest in daily life, feeling hopeless, or crying easily over simple problems.   Medication-taking as directed and without issues.   Fall Screen Patient denies being afraid of falling or falling in the last year. Ambulates with rolling walker.   Memory Screen Patient is alert.  Correctly identified the president of the [place], season and recall. Patient likes to play games on her phone for brain stimulation.  Immunizations The following Immunizations were discussed: Influenza, shingles, pneumonia, and tetanus.   Other Providers Patient Care Team: Einar Pheasant, MD as PCP - General (Internal Medicine) Margaretha Sheffield, MD as Referring Physician (Physical Medicine and Rehabilitation) Margaretha Sheffield, MD as Referring Physician (Physical Medicine and Rehabilitation) Bary Castilla Forest Gleason, MD (General  Surgery)  Hearing/Vision screen  Hearing Screening   125Hz  250Hz  500Hz  1000Hz  2000Hz  3000Hz  4000Hz  6000Hz  8000Hz   Right ear:           Left ear:           Comments: Patient is able to hear conversational tones without difficulty.  No issues reported.     Vision Screening Comments: Visual acuity not assessed, virtual visit.  They have seen their ophthalmologist in the last 12 months     Dietary issues and exercise activities discussed:    Goals      Patient Stated   . DIET - Healthy diet (pt-stated)     Try to eat healthier foods like vegetables and fruits      Depression Screen PHQ 2/9 Scores 03/13/2019 01/14/2016  PHQ - 2 Score 0 5  PHQ- 9 Score - 17    Fall Risk Fall Risk  03/13/2019 01/14/2016  Falls in the past year? 0 Yes  Number falls in past yr: - 1  Injury with Fall? - No    Is the patient's home free of loose throw rugs in walkways, pet beds, electrical cords, etc? yes      Grab bars in the bathroom? yes      Handrails on the stairs? yes      Adequate lighting? yes  Cognitive Function:     6CIT Screen 03/13/2019  What Year? 0 points  What month? 0 points  What time? 0 points  Count back from 20 0 points  Months in reverse 0 points    Screening Tests Health Maintenance  Topic Date Due  . TETANUS/TDAP  03/12/2020 (Originally 02/25/1980)  . Hepatitis C  Screening  03/12/2020 (Originally 24-Jan-1961)  . INFLUENZA VACCINE  03/16/2019  . PAP SMEAR-Modifier  11/26/2019  . MAMMOGRAM  01/24/2020  . COLONOSCOPY  06/24/2020  . HIV Screening  Completed     Plan:   End of life planning; Advanced aging; Advanced directives discussed.  No HCPOA/Living Will.  Additional information declined at this time. She plans to discuss with her doctor at a later date.  I have personally reviewed and noted the following in the patient's chart:   . Medical and social history . Use of alcohol, tobacco or illicit drugs  . Current medications and supplements . Functional  ability and status . Nutritional status . Physical activity . Advanced directives . List of other physicians . Hospitalizations, surgeries, and ER visits in previous 12 months . Vitals . Screenings to include cognitive, depression, and falls . Referrals and appointments  In addition, I have reviewed and discussed with patient certain preventive protocols, quality metrics, and best practice recommendations. A written personalized care plan for preventive services as well as general preventive health recommendations were provided to patient.     Varney Biles, LPN   6/38/4665    Reviewed above information.  Agree with assessment and plan.    Dr Nicki Reaper

## 2019-03-13 NOTE — Patient Instructions (Addendum)
  Ms. Dail , Thank you for taking time to come for your Medicare Wellness Visit. I appreciate your ongoing commitment to your health goals. Please review the following plan we discussed and let me know if I can assist you in the future.   These are the goals we discussed: Goals      Patient Stated   . DIET - Healthy diet (pt-stated)     Try to eat healthier foods like vegetables and fruits       This is a list of the screening recommended for you and due dates:  Health Maintenance  Topic Date Due  . Tetanus Vaccine  03/12/2020*  .  Hepatitis C: One time screening is recommended by Center for Disease Control  (CDC) for  adults born from [birth year] through 1967.   03/12/2020*  . Flu Shot  03/16/2019  . Pap Smear  11/26/2019  . Mammogram  01/24/2020  . Colon Cancer Screening  06/24/2020  . HIV Screening  Completed  *Topic was postponed. The date shown is not the original due date.

## 2019-03-15 ENCOUNTER — Other Ambulatory Visit: Payer: Self-pay

## 2019-03-15 ENCOUNTER — Ambulatory Visit: Payer: Medicare Other | Attending: Pulmonary Disease

## 2019-03-15 DIAGNOSIS — J4541 Moderate persistent asthma with (acute) exacerbation: Secondary | ICD-10-CM | POA: Diagnosis not present

## 2019-03-19 ENCOUNTER — Other Ambulatory Visit: Payer: Self-pay

## 2019-03-19 ENCOUNTER — Ambulatory Visit
Admission: RE | Admit: 2019-03-19 | Discharge: 2019-03-19 | Disposition: A | Discharge status: Home / Self Care | Payer: Medicare Other | Source: Ambulatory Visit | Attending: Pulmonary Disease | Admitting: Pulmonary Disease

## 2019-03-19 ENCOUNTER — Other Ambulatory Visit
Admission: RE | Admit: 2019-03-19 | Discharge: 2019-03-19 | Disposition: A | Discharge status: Home / Self Care | Payer: Medicare Other | Source: Ambulatory Visit | Attending: Pulmonary Disease | Admitting: Pulmonary Disease

## 2019-03-19 ENCOUNTER — Encounter: Payer: Self-pay | Admitting: Pulmonary Disease

## 2019-03-19 ENCOUNTER — Ambulatory Visit (INDEPENDENT_AMBULATORY_CARE_PROVIDER_SITE_OTHER): Payer: Medicare Other | Admitting: Pulmonary Disease

## 2019-03-19 VITALS — BP 134/80 | HR 82 | Temp 97.0°F | Ht 67.0 in | Wt 210.6 lb

## 2019-03-19 DIAGNOSIS — J4541 Moderate persistent asthma with (acute) exacerbation: Secondary | ICD-10-CM

## 2019-03-19 DIAGNOSIS — K219 Gastro-esophageal reflux disease without esophagitis: Secondary | ICD-10-CM

## 2019-03-19 DIAGNOSIS — D721 Eosinophilia, unspecified: Secondary | ICD-10-CM

## 2019-03-19 DIAGNOSIS — J329 Chronic sinusitis, unspecified: Secondary | ICD-10-CM | POA: Diagnosis not present

## 2019-03-19 DIAGNOSIS — J45909 Unspecified asthma, uncomplicated: Secondary | ICD-10-CM | POA: Diagnosis not present

## 2019-03-19 DIAGNOSIS — J4550 Severe persistent asthma, uncomplicated: Secondary | ICD-10-CM

## 2019-03-19 LAB — CBC WITH DIFFERENTIAL/PLATELET
Abs Immature Granulocytes: 0.03 10*3/uL (ref 0.00–0.07)
Basophils Absolute: 0 10*3/uL (ref 0.0–0.1)
Basophils Relative: 0 %
Eosinophils Absolute: 0.5 10*3/uL (ref 0.0–0.5)
Eosinophils Relative: 6 %
HCT: 43.3 % (ref 36.0–46.0)
Hemoglobin: 15 g/dL (ref 12.0–15.0)
Immature Granulocytes: 0 %
Lymphocytes Relative: 25 %
Lymphs Abs: 2.1 10*3/uL (ref 0.7–4.0)
MCH: 30.5 pg (ref 26.0–34.0)
MCHC: 34.6 g/dL (ref 30.0–36.0)
MCV: 88 fL (ref 80.0–100.0)
Monocytes Absolute: 0.6 10*3/uL (ref 0.1–1.0)
Monocytes Relative: 8 %
Neutro Abs: 5 10*3/uL (ref 1.7–7.7)
Neutrophils Relative %: 61 %
Platelets: 332 10*3/uL (ref 150–400)
RBC: 4.92 MIL/uL (ref 3.87–5.11)
RDW: 12 % (ref 11.5–15.5)
WBC: 8.3 10*3/uL (ref 4.0–10.5)
nRBC: 0 % (ref 0.0–0.2)

## 2019-03-19 MED ORDER — OMEPRAZOLE 40 MG PO CPDR: 40.0000 mg | DELAYED_RELEASE_CAPSULE | Freq: Every day | ORAL | 5 refills | Status: DC

## 2019-03-19 MED ORDER — AZITHROMYCIN 250 MG PO TABS: ORAL_TABLET | ORAL | 0 refills | Status: AC

## 2019-03-19 NOTE — Progress Notes (Signed)
PROBLEMS: History of dermatomyositis with evanescent pulmonary infiltrates  Appears to be in remission Moderate-severe persistent asthma   DATA: 03/17/11 CT chest: Atelectasis versus infiltrate within the superior segments of the right upper and right lower lobes 11/16/15: Patchy infiltrate left upper lobe consistent with pneumonia 01/14/16 CXR: Patchy alveolar opacities in the left upper lobe 04/07/16 PFTs: mod-severe obstruction with substantial improvement after bronchodilator (FEV1 1.37 > 1.83 liters, 46-62% pred), mild restriction, normal DLCO 04/07/16 6MWT: 192 meters. More limited by LE weakness 04/20/16 HRCT chest: Extensive air trapping, indicating small airways disease. Mild diffuse bronchial wall thickening. Mild patchy regions of ground-glass attenuation in the mid to upper lungs bilaterally. 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 04/25/16 Office spiro: FEV1 2.15 liters (73%) 07/12/16 Respiratory symptoms deteriorated gradually since last course of prednisone. Progressive SOB, increased wheezing and rescue inhaler use X 2-3 weeks.  06/2017 - 01/2019 lost to follow up due to severe depression 03/15/19 PFTs: FVC: 2.64 > 2.80 L (71 > 76 %pred), FEV1: 1.96 > 2.12 L (68 > 74 %pred), FEV1/FVC: 74%, TLC: 5.11 L (92 %pred), DLCO 89 %pred    INTERVAL HISTORY: Last office visit 01/14/19 which was performed remotely due to the coronavirus epidemic.  At that time, she had not been seen since November 2018.  She reported persistent symptoms of cough, shortness of breath, scant sputum production of several months duration.  She had been off of her inhaler medications for many months.  She was using over-the-counter Primatene Mist which I cautioned her against.  At that time, I prescribed a short course of prednisone, resumed Symbicort inhaler and refilled albuterol inhaler prescription.  I ordered PFTs and CXR with today's follow-up.   SUBJ: This is a  scheduled follow-up.  Since last encounter in early June, She is much improved overall but still struggling with daily shortness of breath and still requiring albuterol inhaler at least 2 times per day.  She has intermittent wheezing.  She also notes daily symptoms of GERD and reports chronic sinusitis symptoms with acute worsening (facial pressure and headache).  She is compliant with Symbicort inhaler and is using it with a spacer device.  As above, she is using albuterol at least 2 times per day.  She takes over-the-counter omeprazole infrequently.  She uses mometasone nasal spray daily.  She requests an antibiotic for "sinusitis".  She denies CP, fever, purulent sputum, hemoptysis, LE edema and calf tenderness.  OBJ: Vitals:   03/19/19 1458  BP: 134/80  Pulse: 82  Temp: (!) 97 F (36.1 C)  TempSrc: Temporal  SpO2: 94%  Weight: 210 lb 9.6 oz (95.5 kg)  Height: 5\' 7"  (1.702 m)    EXAM: Gen: NAD at rest in wheelchair HEENT: NCAT, sclerae white Neck: No JVD noted Lungs: breath sounds full with diffuse polyphonic wheezes Cardiovascular: RRR, no murmurs Abdomen: Soft, nontender, normal BS Ext: without clubbing, cyanosis, edema Neuro: grossly intact Skin: Limited exam, no lesions noted  .  DATA BMP Latest Ref Rng & Units 07/23/2018 02/23/2018 01/23/2018  Glucose 70 - 99 mg/dL 102(H) - -  BUN 6 - 20 mg/dL 12 - -  Creatinine 0.44 - 1.00 mg/dL 0.82 - -  Sodium 135 - 145 mmol/L 138 - -  Potassium 3.5 - 5.1 mmol/L 2.9(L) 4.1 3.8  Chloride 98 - 111 mmol/L 100 - -  CO2 22 - 32 mmol/L 30 - -  Calcium 8.9 - 10.3 mg/dL 8.6(L) - -   CBC Latest Ref Rng & Units 07/23/2018  12/26/2016 11/08/2016  WBC 4.0 - 10.5 K/uL 6.8 9.8 9.2  Hemoglobin 12.0 - 15.0 g/dL 14.7 14.6 13.6  Hematocrit 36.0 - 46.0 % 43.1 43.6 39.1  Platelets 150 - 400 K/uL 349 299 254     CXR 03/19/19: Normal  Unless otherwise indicated, I have personally reviewed all chest imaging studies   IMPRESSION:    ICD-10-CM    1. Severe persistent asthma with persistent wheezing  J45.50   2. GERD  K21.9   3. Acute on chronic sinusitis  J32.9   4. History of mild eosinophilia previously  D72.1 CBC with Differential/Platelet  5. H/O dermatomyositis -apparently in remission  Recent PFTs and today's chest x-ray reviewed above.  Her current PFTs look much improved compared to those from 2017.  However, the presence of wheezing on exam suggest "room for improvement".  I am also concerned about the need for albuterol 2 or more times per day.  This suggests asthma is not adequately controlled.  Potential contributors to poor asthma control include noncompliance with medications (she insists that she is compliant), poor MDI technique (she is using spacer device), exacerbating exposures (none identified), complicating comorbidities (she describes symptoms of GERD and of chronic sinusitis).   She has seen Dr. Vira Agar of gastroenterology in the remote past for GERD.  She has seen Dr. Pryor Ochoa of the ENT in the remote past for symptoms of chronic sinusitis  PLAN: Continue Symbicort inhaler 160-4.5, 2 actuations twice a day.  Rinse mouth after use.  Continue albuterol inhaler as needed for increased shortness of breath, wheezing, chest tightness, cough  Lab test today: CBC with differential (specifically to look at eosinophil count)  New prescription: Omeprazole 40 mg daily to be taken after dinnertime  New prescription: Azithromycin (Z-Pak) for acute exacerbation of chronic sinusitis  Follow-up in 6-8 weeks with Dr. Patsey Berthold.  If her asthma remains poorly controlled, would consider CT scan of sinuses and possible referral to ENT if this demonstrates significant sinusitis.  Ultimately, if eosinophilia remains a persistent finding, might need to consider a biological therapy such as Nucala or Tobie Lords, MD PCCM service Mobile [phone] Pager [phone] 03/19/2019 3:41 PM

## 2019-03-19 NOTE — Patient Instructions (Signed)
Continue Symbicort 160-4.5, 2 inhalations twice a day.  Rinse mouth after use Continue albuterol inhaler as needed for increased wheezing, chest tightness, shortness of breath, cough New prescription: Omeprazole (Prilosec) 40 mg daily after dinner or before bedtime New prescription: Z-Pak.  Take as directed Lab test today: CBC with differential (to look at the eosinophil count)  Follow up in 6-8 weeks with Dr Patsey Berthold. Call sooner as needed

## 2019-04-04 DIAGNOSIS — M62830 Muscle spasm of back: Secondary | ICD-10-CM | POA: Diagnosis not present

## 2019-04-04 DIAGNOSIS — G4700 Insomnia, unspecified: Secondary | ICD-10-CM | POA: Diagnosis not present

## 2019-04-04 DIAGNOSIS — G894 Chronic pain syndrome: Secondary | ICD-10-CM | POA: Diagnosis not present

## 2019-04-04 DIAGNOSIS — M47817 Spondylosis without myelopathy or radiculopathy, lumbosacral region: Secondary | ICD-10-CM | POA: Diagnosis not present

## 2019-04-08 ENCOUNTER — Other Ambulatory Visit: Payer: Self-pay | Admitting: Internal Medicine

## 2019-04-23 ENCOUNTER — Other Ambulatory Visit: Payer: Self-pay | Admitting: Internal Medicine

## 2019-04-30 ENCOUNTER — Ambulatory Visit (INDEPENDENT_AMBULATORY_CARE_PROVIDER_SITE_OTHER): Payer: Medicare Other | Admitting: Internal Medicine

## 2019-04-30 ENCOUNTER — Telehealth: Payer: Self-pay | Admitting: *Deleted

## 2019-04-30 ENCOUNTER — Encounter: Payer: Self-pay | Admitting: Internal Medicine

## 2019-04-30 ENCOUNTER — Other Ambulatory Visit: Payer: Self-pay

## 2019-04-30 VITALS — Ht 67.0 in | Wt 210.0 lb

## 2019-04-30 DIAGNOSIS — I10 Essential (primary) hypertension: Secondary | ICD-10-CM | POA: Diagnosis not present

## 2019-04-30 DIAGNOSIS — J4520 Mild intermittent asthma, uncomplicated: Secondary | ICD-10-CM | POA: Diagnosis not present

## 2019-04-30 DIAGNOSIS — M3390 Dermatopolymyositis, unspecified, organ involvement unspecified: Secondary | ICD-10-CM

## 2019-04-30 DIAGNOSIS — E7800 Pure hypercholesterolemia, unspecified: Secondary | ICD-10-CM

## 2019-04-30 DIAGNOSIS — R739 Hyperglycemia, unspecified: Secondary | ICD-10-CM

## 2019-04-30 DIAGNOSIS — F419 Anxiety disorder, unspecified: Secondary | ICD-10-CM | POA: Diagnosis not present

## 2019-04-30 DIAGNOSIS — K219 Gastro-esophageal reflux disease without esophagitis: Secondary | ICD-10-CM

## 2019-04-30 NOTE — Progress Notes (Addendum)
Patient ID: [name], female   DOB: 1960-12-07, 58 y.o.   MRN: [id number]   Virtual Visit via video Note  This visit type was conducted due to national recommendations for restrictions regarding the COVID-19 pandemic (e.g. social distancing).  This format is felt to be most appropriate for this patient at this time.  All issues noted in this document were discussed and addressed.  No physical exam was performed (except for noted visual exam findings with Video Visits).   I connected with [name] by a video enabled telemedicine application and verified that I am speaking with the correct person using two identifiers. Location patient: home Location provider: work Persons participating in the virtual visit: patient, provider  I discussed the limitations, risks, security and privacy concerns of performing an evaluation and management service by video and the availability of in person appointments. The patient expressed understanding and agreed to proceed.   Reason for visit: scheduled follow up.    HPI: She has been seeing Dr Alva Garnet.  On symbicort.  Last visit 03/19/19 - persistent symptoms with cough, sob and some congestion.  Note reviewed.   Her PFTs - improved.  Was given zpak.  Also placed on omeprazole 40mg  q day. She does feel better.  Still some issues.  States at times, when she bends over, she will regurgitate food.  Discussed need for GI evaluation given symptoms. She is in agreement.  No chest pain.  No abdominal pain.  Bowels moving.  Using a walker to get around her house.  Followed by pain clinic.  Blood pressures averaging 120-130/70-80.  Seeing psychiatry.  Some increased stress with her health issues and family health issues.  Discussed with her today. She does feel she is doing better.     ROS: See pertinent positives and negatives per HPI.  Past Medical History:  Diagnosis Date  . Allergic rhinitis   . Anxiety   . Arthralgia   . Asthma   . Bronchitis, chronic  ([place])   . Chronic back pain   . Collagenous colitis    followed by Dr Tiffany Kocher  . Depression   . Diplopia 11/15/2013  . Dysphagia   . Endometriosis   . Fibrocystic breast disease   . GERD (gastroesophageal reflux disease)   . History of colon polyps   . Hyperlipidemia   . Hypertension   . Hypokalemia   . IBS (irritable bowel syndrome)   . Memory difficulty 11/15/2013  . Migraine headache   . Pneumonia 2017   x2  . PONV (postoperative nausea and vomiting)    used patch  . Sleep apnea    recommended CPAP  . Ulcer disease   . Urine incontinence     Past Surgical History:  Procedure Laterality Date  . ABDOMINAL HYSTERECTOMY     endometriosis  . BACK SURGERY     x6  . BREAST BIOPSY Right   . COLONOSCOPY WITH PROPOFOL N/A 06/25/2015   Procedure: COLONOSCOPY WITH PROPOFOL;  Surgeon: Manya Silvas, MD;  Location: [facility] ENDOSCOPY;  Service: Endoscopy;  Laterality: N/A;  . DILATION AND CURETTAGE OF UTERUS     and hysteroscopy  . ESOPHAGOGASTRODUODENOSCOPY N/A 06/25/2015   Procedure: ESOPHAGOGASTRODUODENOSCOPY (EGD);  Surgeon: Manya Silvas, MD;  Location: [facility] ENDOSCOPY;  Service: Endoscopy;  Laterality: N/A;  . insertion of permanent spinal cord stimulator    . insertion of trial spinal cord stimulator    . LUMBAR DISC SURGERY    . LUMBAR FUSION  4/05 and  8/07  . LUMBAR MICRODISCECTOMY  03/28/03   L4-5 L5  . MOUTH SURGERY    . NASAL SEPTOPLASTY W/ TURBINOPLASTY    . removal of spinal cord stimulator    . SEPTOPLASTY     with reduction of turbinates  . THYROIDECTOMY N/A 06/06/2016   Procedure: THYROIDECTOMY;  Surgeon: Robert Bellow, MD;  Location: [facility] ORS;  Service: General;  Laterality: N/A;    Family History  Problem Relation Age of Onset  . Allergies Mother   . Hypertension Mother   . Arthritis Mother   . Hyperlipidemia Mother   . Stroke Mother   . Hypertension Father   . Arthritis Father   . Diabetes Father   . Hypertension Brother   . Diabetes Brother    . Arthritis Maternal Grandmother   . Hyperlipidemia Maternal Grandmother   . Hyperlipidemia Maternal Grandfather   . Arthritis Paternal Grandmother   . Arthritis Paternal Grandfather   . Asthma Other        several family member  . Leukemia Other        uncle  . Lymphoma Maternal Aunt        non hodgkins    SOCIAL HX: reviewed.     Current Outpatient Medications:  .  albuterol (PROVENTIL) (2.5 MG/3ML) 0.083% nebulizer solution, Take 2.5 mg by nebulization every 4 (four) hours as needed for wheezing or shortness of breath., Disp: , Rfl:  .  albuterol (VENTOLIN HFA) 108 (90 Base) MCG/ACT inhaler, Inhale 2 puffs into the lungs every 4 (four) hours as needed for wheezing or shortness of breath., Disp: 1 Inhaler, Rfl: 3 .  amLODipine (NORVASC) 5 MG tablet, TAKE 1 TABLET BY MOUTH EVERY DAY, Disp: 90 tablet, Rfl: 1 .  bisacodyl (DULCOLAX) 5 MG EC tablet, Take by mouth., Disp: , Rfl:  .  budesonide-formoterol (SYMBICORT) 160-4.5 MCG/ACT inhaler, Inhale 2 puffs into the lungs 2 (two) times daily., Disp: 1 Inhaler, Rfl: 12 .  diazepam (VALIUM) 5 MG tablet, Take 5 mg by mouth 3 (three) times daily as needed.  , Disp: , Rfl:  .  docusate sodium (COLACE) 100 MG capsule, Take 100 mg by mouth 2 (two) times daily., Disp: , Rfl:  .  GRALISE 600 MG TABS, , Disp: , Rfl:  .  hydrochlorothiazide (HYDRODIURIL) 25 MG tablet, Take 25 mg by mouth daily., Disp: , Rfl: 11 .  metoprolol tartrate (LOPRESSOR) 50 MG tablet, TAKE 1/2 TABLET BY MOUTH TWICE A DAY (NEED OFFICE VISIT), Disp: 90 tablet, Rfl: 0 .  mometasone (NASONEX) 50 MCG/ACT nasal spray, Place into the nose., Disp: , Rfl:  .  Multiple Vitamin (MULTIVITAMIN) capsule, Take 1 capsule by mouth daily.  , Disp: , Rfl:  .  Omega-3 Fatty Acids (FISH OIL) 1200 MG CAPS, Take 1 capsule by mouth daily., Disp: , Rfl:  .  omeprazole (PRILOSEC) 40 MG capsule, Take 1 capsule (40 mg total) by mouth at bedtime., Disp: 30 capsule, Rfl: 5 .  OS-CAL CALCIUM + D3 500-200  MG-UNIT TABS, TAKE 2 TABLETS BY MOUTH 2 (TWO) TIMES DAILY., Disp: , Rfl: 6 .  Oxycodone HCl 10 MG TABS, Take 10 mg by mouth 3 (three) times daily as needed., Disp: , Rfl: 0 .  potassium chloride (K-DUR) 10 MEQ tablet, TAKE 1 TABLET BY MOUTH TWICE A DAY, Disp: 60 tablet, Rfl: 0 .  pravastatin (PRAVACHOL) 10 MG tablet, TAKE 1 TABLET (10 MG TOTAL) BY MOUTH DAILY., Disp: 90 tablet, Rfl: 2 .  RELISTOR 150 MG  TABS, TAKE 3 TABLETS BY MOUTH ONCE A DAY prn, Disp: , Rfl: 2 .  TIROSINT 137 MCG CAPS, TAKE 1 CAPSULE BY MOUTH DAILY BEFORE BREAKFAST., Disp: 30 capsule, Rfl: 2 .  venlafaxine XR (EFFEXOR-XR) 150 MG 24 hr capsule, Take 1 capsule (150 mg total) by mouth daily with breakfast., Disp: 90 capsule, Rfl: 1 .  XTAMPZA ER 36 MG C12A, TAKE 1 CAPSULE BY MOUTH EVERY 12 HOURS, Disp: , Rfl: 0 .  zolpidem (AMBIEN) 10 MG tablet, Take 10 mg by mouth at bedtime as needed.  , Disp: , Rfl:   EXAM:  VITALS per patient if applicable:  [id number]  GENERAL: alert, oriented, appears well and in no acute distress  HEENT: atraumatic, conjunttiva clear, no obvious abnormalities on inspection of external nose and ears  NECK: normal movements of the head and neck  LUNGS: on inspection no signs of respiratory distress, breathing rate appears normal, no obvious gross SOB, gasping or wheezing  CV: no obvious cyanosis  PSYCH/NEURO: pleasant and cooperative, no obvious depression or anxiety, speech and thought processing grossly intact  ASSESSMENT AND PLAN:  Discussed the following assessment and plan:  Anxiety Followed by psychiatry.    Essential hypertension, benign Blood pressure as outlined.  Continue current medication regimen.  Follow pressures. Follow metabolic panel.    GERD Symptoms as outlined.  Regurgitation of food as outlined.  Continue omeprazole 40mg  q day.  Refer to GI for further evaluation and question of need for EGD.    Hypercholesterolemia On pravastatin.  Low cholesterol diet and  exercise.  Follow lipid panel and liver function tests.    Asthma Followed by pulmonary.  On symbicort.  Recent PFTs improved.  Continue f/u with pulmonary.    DERMATOMYOSITIS Followed by rheumatology.      I discussed the assessment and treatment plan with the patient. The patient was provided an opportunity to ask questions and all were answered. The patient agreed with the plan and demonstrated an understanding of the instructions.   The patient was advised to call back or seek an in-person evaluation if the symptoms worsen or if the condition fails to improve as anticipated.   Einar Pheasant, MD

## 2019-04-30 NOTE — Telephone Encounter (Signed)
Copied from [place] [id number]. Topic: General - Inquiry >> Apr 30, 2019  1:28 PM [name] wrote: Reason for CRM:   Pt was to let Dr Nicki Reaper know the name of the GI doctor she wants to see [facility] in [place]   phone [phone]

## 2019-04-30 NOTE — Telephone Encounter (Signed)
Refer to Dr Allen Norris.  See note.

## 2019-05-05 ENCOUNTER — Encounter: Payer: Self-pay | Admitting: Internal Medicine

## 2019-05-05 NOTE — Assessment & Plan Note (Signed)
Followed by rheumatology. 

## 2019-05-05 NOTE — Assessment & Plan Note (Signed)
On pravastatin.  Low cholesterol diet and exercise.  Follow lipid panel and liver function tests.   

## 2019-05-05 NOTE — Assessment & Plan Note (Signed)
Symptoms as outlined.  Regurgitation of food as outlined.  Continue omeprazole 40mg  q day.  Refer to GI for further evaluation and question of need for EGD.

## 2019-05-05 NOTE — Addendum Note (Signed)
Addended by: Alisa Graff on: 05/05/2019 12:30 PM   Modules accepted: Orders

## 2019-05-05 NOTE — Assessment & Plan Note (Signed)
Followed by pulmonary.  On symbicort.  Recent PFTs improved.  Continue f/u with pulmonary.

## 2019-05-05 NOTE — Assessment & Plan Note (Signed)
Followed by psychiatry 

## 2019-05-05 NOTE — Assessment & Plan Note (Signed)
Blood pressure as outlined.  Continue current medication regimen.  Follow pressures.  Follow metabolic panel.  

## 2019-05-07 DIAGNOSIS — M62830 Muscle spasm of back: Secondary | ICD-10-CM | POA: Diagnosis not present

## 2019-05-07 DIAGNOSIS — M47817 Spondylosis without myelopathy or radiculopathy, lumbosacral region: Secondary | ICD-10-CM | POA: Diagnosis not present

## 2019-05-07 DIAGNOSIS — G894 Chronic pain syndrome: Secondary | ICD-10-CM | POA: Diagnosis not present

## 2019-05-07 DIAGNOSIS — G4700 Insomnia, unspecified: Secondary | ICD-10-CM | POA: Diagnosis not present

## 2019-05-08 ENCOUNTER — Ambulatory Visit: Payer: Medicare Other | Admitting: Pulmonary Disease

## 2019-05-12 ENCOUNTER — Other Ambulatory Visit: Payer: Self-pay | Admitting: Internal Medicine

## 2019-05-13 ENCOUNTER — Encounter: Payer: Self-pay | Admitting: *Deleted

## 2019-05-27 ENCOUNTER — Ambulatory Visit: Payer: Medicare Other | Admitting: Psychiatry

## 2019-06-04 DIAGNOSIS — G4700 Insomnia, unspecified: Secondary | ICD-10-CM | POA: Diagnosis not present

## 2019-06-04 DIAGNOSIS — M62830 Muscle spasm of back: Secondary | ICD-10-CM | POA: Diagnosis not present

## 2019-06-04 DIAGNOSIS — M47817 Spondylosis without myelopathy or radiculopathy, lumbosacral region: Secondary | ICD-10-CM | POA: Diagnosis not present

## 2019-06-04 DIAGNOSIS — G894 Chronic pain syndrome: Secondary | ICD-10-CM | POA: Diagnosis not present

## 2019-06-11 ENCOUNTER — Ambulatory Visit: Payer: Medicare Other | Admitting: Pulmonary Disease

## 2019-06-12 ENCOUNTER — Encounter: Payer: Self-pay | Admitting: Psychiatry

## 2019-06-12 ENCOUNTER — Other Ambulatory Visit: Payer: Self-pay

## 2019-06-12 ENCOUNTER — Ambulatory Visit (INDEPENDENT_AMBULATORY_CARE_PROVIDER_SITE_OTHER): Payer: Medicare Other | Admitting: Psychiatry

## 2019-06-12 DIAGNOSIS — F411 Generalized anxiety disorder: Secondary | ICD-10-CM | POA: Diagnosis not present

## 2019-06-12 DIAGNOSIS — F331 Major depressive disorder, recurrent, moderate: Secondary | ICD-10-CM | POA: Diagnosis not present

## 2019-06-12 MED ORDER — VENLAFAXINE HCL ER 75 MG PO CP24: ORAL_CAPSULE | ORAL | 1 refills | Status: DC

## 2019-06-12 NOTE — Progress Notes (Signed)
[facility] MD OP Progress Note  I connected with  [name] on 06/12/19 by a video enabled telemedicine application and verified that I am speaking with the correct person using two identifiers.   I discussed the limitations of evaluation and management by telemedicine. The patient expressed understanding and agreed to proceed.    06/12/2019 11:46 AM [name]  MRN:  [id number]  Chief Complaint:  " I have been depressed."  HPI: This is a 58 year old female with history of depression and anxiety now seen for follow-up.  Patient reported that she has been feeling depressed lately and has been having frequent crying spells.  She reported that in the past she was taking higher dose of Effexor and is wondering if she can go back to the higher dose.  She reported that she is in constant pain due to back issues.  He reported that she has undergone multiple back surgeries in the past which have not helped much. She is followed by pain management.  PDMP was reviewed, it was noted that she is being prescribed diazepam 5 mg 3 times a day, oxycodone 10 mg 3 times a day and zolpidem 10 mg at bedtime. Patient also reported being on Topamax in the past. Patient stated that recently her 54 year old grandson moved in with them which has helped her mood some. She stated that she mostly spends her day in bed laying around due to chronic pain. She was agreeable to going up on the dose of Effexor for optimal effect.  Visit Diagnosis:    ICD-10-CM   1. MDD (major depressive disorder), recurrent episode, moderate (HCC)  F33.1   2. Anxiety state  F41.1     Past Psychiatric History: See above  Past Medical History:  Past Medical History:  Diagnosis Date  . Allergic rhinitis   . Anxiety   . Arthralgia   . Asthma   . Bronchitis, chronic ([place])   . Chronic back pain   . Collagenous colitis    followed by Dr Tiffany Kocher  . Depression   . Diplopia 11/15/2013  . Dysphagia   . Endometriosis   . Fibrocystic  breast disease   . GERD (gastroesophageal reflux disease)   . History of colon polyps   . Hyperlipidemia   . Hypertension   . Hypokalemia   . IBS (irritable bowel syndrome)   . Memory difficulty 11/15/2013  . Migraine headache   . Pneumonia 2017   x2  . PONV (postoperative nausea and vomiting)    used patch  . Sleep apnea    recommended CPAP  . Ulcer disease   . Urine incontinence     Past Surgical History:  Procedure Laterality Date  . ABDOMINAL HYSTERECTOMY     endometriosis  . BACK SURGERY     x6  . BREAST BIOPSY Right   . COLONOSCOPY WITH PROPOFOL N/A 06/25/2015   Procedure: COLONOSCOPY WITH PROPOFOL;  Surgeon: Manya Silvas, MD;  Location: [facility] ENDOSCOPY;  Service: Endoscopy;  Laterality: N/A;  . DILATION AND CURETTAGE OF UTERUS     and hysteroscopy  . ESOPHAGOGASTRODUODENOSCOPY N/A 06/25/2015   Procedure: ESOPHAGOGASTRODUODENOSCOPY (EGD);  Surgeon: Manya Silvas, MD;  Location: [facility] ENDOSCOPY;  Service: Endoscopy;  Laterality: N/A;  . insertion of permanent spinal cord stimulator    . insertion of trial spinal cord stimulator    . LUMBAR DISC SURGERY    . LUMBAR FUSION  4/05 and 8/07  . LUMBAR MICRODISCECTOMY  03/28/03   L4-5 L5  .  MOUTH SURGERY    . NASAL SEPTOPLASTY W/ TURBINOPLASTY    . removal of spinal cord stimulator    . SEPTOPLASTY     with reduction of turbinates  . THYROIDECTOMY N/A 06/06/2016   Procedure: THYROIDECTOMY;  Surgeon: Robert Bellow, MD;  Location: [facility] ORS;  Service: General;  Laterality: N/A;    Family Psychiatric History: Denied  Family History:  Family History  Problem Relation Age of Onset  . Allergies Mother   . Hypertension Mother   . Arthritis Mother   . Hyperlipidemia Mother   . Stroke Mother   . Hypertension Father   . Arthritis Father   . Diabetes Father   . Hypertension Brother   . Diabetes Brother   . Arthritis Maternal Grandmother   . Hyperlipidemia Maternal Grandmother   . Hyperlipidemia Maternal  Grandfather   . Arthritis Paternal Grandmother   . Arthritis Paternal Grandfather   . Asthma Other        several family member  . Leukemia Other        uncle  . Lymphoma Maternal Aunt        non hodgkins    Social History:  Social History   Socioeconomic History  . Marital status: Married    Spouse name: [name]  . Number of children: 1  . Years of education: 101 th  . Highest education level: High school graduate  Occupational History  . Occupation: Disabled    Employer: DISABLED  Social Needs  . Financial resource strain: Not hard at all  . Food insecurity    Worry: Never true    Inability: Never true  . Transportation needs    Medical: No    Non-medical: No  Tobacco Use  . Smoking status: Never Smoker  . Smokeless tobacco: Never Used  Substance and Sexual Activity  . Alcohol use: No    Alcohol/week: 0.0 standard drinks  . Drug use: No  . Sexual activity: Not Currently  Lifestyle  . Physical activity    Days per week: 0 days    Minutes per session: 0 min  . Stress: Very much  Relationships  . Social Herbalist on phone: Three times a week    Gets together: Never    Attends religious service: More than 4 times per year    Active member of club or organization: No    Attends meetings of clubs or organizations: Never    Relationship status: Not on file  Other Topics Concern  . Not on file  Social History Narrative  . Not on file    Allergies:  Allergies  Allergen Reactions  . Fentanyl Other (See Comments)    Other reaction(s): Headache, Other (See Comments) headache SEVERE HEADACHE headache  . Sumatriptan Other (See Comments)    Other reaction(s): Headache Severe headache  . Doxycycline Other (See Comments)    Other reaction(s): Headache, Other (See Comments) REACTION: causes severe abd pain REACTION: causes severe abd pain REACTION: causes severe abd pain  . Montelukast Other (See Comments)    SEVERE HEADACHE  . Oxymorphone Other  (See Comments)    REACTION: severe headaches  . Benzoin Rash  . Buspirone Other (See Comments)    SEVERE HEADACHE  . Ciprofloxacin Nausea Only  . Conjugated Estrogens Other (See Comments)    headaches  . Cymbalta [Duloxetine Hcl] Other (See Comments)    Headache  . Duloxetine Other (See Comments)    Headache  . Erythromycin Ethylsuccinate Other (See  Comments)    Stomach ache  . Estradiol Other (See Comments)    headache  . Metronidazole Other (See Comments)    abd pain  . Mirtazapine Other (See Comments)    headache    Metabolic Disorder Labs: No results found for: HGBA1C, MPG No results found for: PROLACTIN Lab Results  Component Value Date   CHOL 220 (H) 01/10/2018   TRIG 268.0 (H) 01/10/2018   HDL 42.20 01/10/2018   CHOLHDL 5 01/10/2018   VLDL 53.6 (H) 01/10/2018   LDLCALC 121 (H) 07/12/2016   LDLCALC 187 (H) 08/03/2015   Lab Results  Component Value Date   TSH 1.78 02/23/2018   TSH 4.75 (H) 01/10/2018    Therapeutic Level Labs: No results found for: LITHIUM No results found for: VALPROATE No components found for:  CBMZ  Current Medications: Current Outpatient Medications  Medication Sig Dispense Refill  . albuterol (PROVENTIL) (2.5 MG/3ML) 0.083% nebulizer solution Take 2.5 mg by nebulization every 4 (four) hours as needed for wheezing or shortness of breath.    Marland Kitchen albuterol (VENTOLIN HFA) 108 (90 Base) MCG/ACT inhaler Inhale 2 puffs into the lungs every 4 (four) hours as needed for wheezing or shortness of breath. 1 Inhaler 3  . amLODipine (NORVASC) 5 MG tablet TAKE 1 TABLET BY MOUTH EVERY DAY 90 tablet 1  . bisacodyl (DULCOLAX) 5 MG EC tablet Take by mouth.    . budesonide-formoterol (SYMBICORT) 160-4.5 MCG/ACT inhaler Inhale 2 puffs into the lungs 2 (two) times daily. 1 Inhaler 12  . diazepam (VALIUM) 5 MG tablet Take 5 mg by mouth 3 (three) times daily as needed.      . docusate sodium (COLACE) 100 MG capsule Take 100 mg by mouth 2 (two) times daily.     Marland Kitchen GRALISE 600 MG TABS     . hydrochlorothiazide (HYDRODIURIL) 25 MG tablet Take 25 mg by mouth daily.  11  . metoprolol tartrate (LOPRESSOR) 50 MG tablet TAKE 1/2 TABLET BY MOUTH TWICE A DAY (NEED OFFICE VISIT) 90 tablet 0  . mometasone (NASONEX) 50 MCG/ACT nasal spray Place into the nose.    . Multiple Vitamin (MULTIVITAMIN) capsule Take 1 capsule by mouth daily.      . Omega-3 Fatty Acids (FISH OIL) 1200 MG CAPS Take 1 capsule by mouth daily.    Marland Kitchen omeprazole (PRILOSEC) 40 MG capsule Take 1 capsule (40 mg total) by mouth at bedtime. 30 capsule 5  . OS-CAL CALCIUM + D3 500-200 MG-UNIT TABS TAKE 2 TABLETS BY MOUTH 2 (TWO) TIMES DAILY.  6  . Oxycodone HCl 10 MG TABS Take 10 mg by mouth 3 (three) times daily as needed.  0  . potassium chloride (K-DUR) 10 MEQ tablet TAKE 1 TABLET BY MOUTH TWICE A DAY 60 tablet 0  . pravastatin (PRAVACHOL) 10 MG tablet TAKE 1 TABLET (10 MG TOTAL) BY MOUTH DAILY. 90 tablet 2  . RELISTOR 150 MG TABS TAKE 3 TABLETS BY MOUTH ONCE A DAY prn  2  . TIROSINT 137 MCG CAPS TAKE 1 CAPSULE BY MOUTH DAILY BEFORE BREAKFAST. 30 capsule 2  . venlafaxine XR (EFFEXOR-XR) 150 MG 24 hr capsule Take 1 capsule (150 mg total) by mouth daily with breakfast. 90 capsule 1  . XTAMPZA ER 36 MG C12A TAKE 1 CAPSULE BY MOUTH EVERY 12 HOURS  0  . zolpidem (AMBIEN) 10 MG tablet Take 10 mg by mouth at bedtime as needed.       No current facility-administered medications for this visit.  Musculoskeletal: Strength & Muscle Tone: unable to assess due to telemed visit Gait & Station: unable to assess due to telemed visit Patient leans: unable to assess due to telemed visit   Psychiatric Specialty Exam: ROS  There were no vitals taken for this visit.There is no height or weight on file to calculate BMI.  General Appearance: Disheveled  Eye Contact:  Good  Speech:  Clear and Coherent and Normal Rate  Volume:  Normal  Mood:  Depressed and Dysphoric  Affect:  Depressed  Thought Process:   Goal Directed, Linear and Descriptions of Associations: Intact  Orientation:  Full (Time, Place, and Person)  Thought Content: Logical   Suicidal Thoughts:  No  Homicidal Thoughts:  No  Memory:  Recent;   Good Remote;   Good  Judgement:  Fair  Insight:  Fair  Psychomotor Activity:  Normal  Concentration:  Concentration: Good and Attention Span: Good  Recall:  Good  Fund of Knowledge: Good  Language: Good  Akathisia:  Negative  Handed:  Right  AIMS (if indicated): not done  Assets:  Communication Skills Desire for Improvement Housing Social Support Transportation  ADL's:  Intact  Cognition: WNL  Sleep:  Fair   Screenings: PHQ2-9     Office Visit from 04/30/2019 in [place] from 03/13/2019 in [place] Office Visit from 01/14/2016 in [facility]  PHQ-2 Total Score  4  0  5  PHQ-9 Total Score  14  -  17       Assessment and Plan: 58 year old female with depression and anxiety in addition to chronic pain now seen for follow-up. She reported feeling depressed.  She was agreeable to increasing her dose of Effexor to 225 mg for optimal effect.  Potential side effects of medication and risks vs benefits of treatment vs non-treatment were explained and discussed. All questions were answered.   1. MDD (major depressive disorder), recurrent episode, moderate (HCC)  - Increase venlafaxine XR (EFFEXOR XR) 75 MG 24 hr capsule; Take 3 capsules in the morning  Dispense: 90 capsule; Refill: 1  2. Anxiety state  - Increase venlafaxine XR (EFFEXOR XR) 75 MG 24 hr capsule; Take 3 capsules in the morning  Dispense: 90 capsule; Refill: 1  Pt is continuing to take Diazepam, Oxycodone and Ambien prescribed by her pain management specialist.  F/up in 6 weeks.  [place] Crane, MD 06/12/2019, 11:46 AM

## 2019-06-25 ENCOUNTER — Other Ambulatory Visit: Payer: Self-pay | Admitting: Internal Medicine

## 2019-07-01 ENCOUNTER — Other Ambulatory Visit: Payer: Self-pay | Admitting: Internal Medicine

## 2019-07-09 ENCOUNTER — Telehealth: Payer: Self-pay

## 2019-07-09 NOTE — Telephone Encounter (Signed)
Copied from [place] [id number]. Topic: Appointment Scheduling - Scheduling Inquiry for Clinic >> Jul 09, 2019 10:37 AM [name] wrote: Reason for CRM: Patient is wanting to sch labs for 10 am . Please advise

## 2019-07-09 NOTE — Telephone Encounter (Signed)
Lm for pt to call back and schedule.

## 2019-07-16 ENCOUNTER — Ambulatory Visit: Payer: Medicare Other | Admitting: Gastroenterology

## 2019-07-21 ENCOUNTER — Other Ambulatory Visit: Payer: Self-pay | Admitting: Internal Medicine

## 2019-07-24 ENCOUNTER — Other Ambulatory Visit: Payer: Self-pay

## 2019-07-24 ENCOUNTER — Ambulatory Visit (INDEPENDENT_AMBULATORY_CARE_PROVIDER_SITE_OTHER): Payer: Medicare Other | Admitting: Psychiatry

## 2019-07-24 ENCOUNTER — Encounter: Payer: Self-pay | Admitting: Psychiatry

## 2019-07-24 DIAGNOSIS — F411 Generalized anxiety disorder: Secondary | ICD-10-CM

## 2019-07-24 DIAGNOSIS — F331 Major depressive disorder, recurrent, moderate: Secondary | ICD-10-CM | POA: Diagnosis not present

## 2019-07-24 DIAGNOSIS — F3341 Major depressive disorder, recurrent, in partial remission: Secondary | ICD-10-CM

## 2019-07-24 MED ORDER — VENLAFAXINE HCL ER 75 MG PO CP24: ORAL_CAPSULE | ORAL | 1 refills | Status: DC

## 2019-07-24 MED ORDER — TOPIRAMATE 50 MG PO TABS: 50.0000 mg | ORAL_TABLET | Freq: Two times a day (BID) | ORAL | 1 refills | Status: DC

## 2019-07-24 NOTE — Progress Notes (Signed)
[facility] OP Progress Note  I connected with  [name] on 07/24/19 by a video enabled telemedicine application and verified that I am speaking with the correct person using two identifiers.   I discussed the limitations of evaluation and management by telemedicine. The patient expressed understanding and agreed to proceed.    07/24/2019 2:57 PM [name]  MRN:  [id number]  Chief Complaint:  " I can tell a difference with increased dose of Effexor but I am still depressed."  HPI: This is a 58 year old female with history of depression and anxiety now seen for follow-up.  Patient reported that she could feel better with increased dose of Effexor but she still remains isolated in her room.  She states she does not want to do anything.  She avoids talking to her family or other people as it often upsets her.  She informed her mother recently fell and hurt herself but she did not even go to see her.  She only goes out of her home for her pain management appointment once a month. She informed that her previous psychiatrist Dr. Bary Leriche had her on Topamax 50 mg twice a day and that really helped her mood immensely.  She states she would like to restart that again.  Visit Diagnosis:    ICD-10-CM   1. MDD (major depressive disorder), recurrent, in partial remission ([place])  F33.41   2. Anxiety state  F41.1     Past Psychiatric History: See above  Past Medical History:  Past Medical History:  Diagnosis Date  . Allergic rhinitis   . Anxiety   . Arthralgia   . Asthma   . Bronchitis, chronic ([place])   . Chronic back pain   . Collagenous colitis    followed by Dr Tiffany Kocher  . Depression   . Diplopia 11/15/2013  . Dysphagia   . Endometriosis   . Fibrocystic breast disease   . GERD (gastroesophageal reflux disease)   . History of colon polyps   . Hyperlipidemia   . Hypertension   . Hypokalemia   . IBS (irritable bowel syndrome)   . Memory difficulty 11/15/2013  . Migraine headache   .  Pneumonia 2017   x2  . PONV (postoperative nausea and vomiting)    used patch  . Sleep apnea    recommended CPAP  . Ulcer disease   . Urine incontinence     Past Surgical History:  Procedure Laterality Date  . ABDOMINAL HYSTERECTOMY     endometriosis  . BACK SURGERY     x6  . BREAST BIOPSY Right   . COLONOSCOPY WITH PROPOFOL N/A 06/25/2015   Procedure: COLONOSCOPY WITH PROPOFOL;  Surgeon: Manya Silvas, MD;  Location: [facility] ENDOSCOPY;  Service: Endoscopy;  Laterality: N/A;  . DILATION AND CURETTAGE OF UTERUS     and hysteroscopy  . ESOPHAGOGASTRODUODENOSCOPY N/A 06/25/2015   Procedure: ESOPHAGOGASTRODUODENOSCOPY (EGD);  Surgeon: Manya Silvas, MD;  Location[facility] ENDOSCOPY;  Service: Endoscopy;  Laterality: N/A;  . insertion of permanent spinal cord stimulator    . insertion of trial spinal cord stimulator    . LUMBAR DISC SURGERY    . LUMBAR FUSION  4/05 and 8/07  . LUMBAR MICRODISCECTOMY  03/28/03   L4-5 L5  . MOUTH SURGERY    . NASAL SEPTOPLASTY W/ TURBINOPLASTY    . removal of spinal cord stimulator    . SEPTOPLASTY     with reduction of turbinates  . THYROIDECTOMY N/A 06/06/2016   Procedure:  THYROIDECTOMY;  Surgeon: Robert Bellow, MD;  Location: [facility] ORS;  Service: General;  Laterality: N/A;    Family Psychiatric History: Denied  Family History:  Family History  Problem Relation Age of Onset  . Allergies Mother   . Hypertension Mother   . Arthritis Mother   . Hyperlipidemia Mother   . Stroke Mother   . Hypertension Father   . Arthritis Father   . Diabetes Father   . Hypertension Brother   . Diabetes Brother   . Arthritis Maternal Grandmother   . Hyperlipidemia Maternal Grandmother   . Hyperlipidemia Maternal Grandfather   . Arthritis Paternal Grandmother   . Arthritis Paternal Grandfather   . Asthma Other        several family member  . Leukemia Other        uncle  . Lymphoma Maternal Aunt        non hodgkins    Social History:  Social  History   Socioeconomic History  . Marital status: Married    Spouse name: douglas  . Number of children: 1  . Years of education: 56 th  . Highest education level: High school graduate  Occupational History  . Occupation: Disabled    Employer: DISABLED  Social Needs  . Financial resource strain: Not hard at all  . Food insecurity    Worry: Never true    Inability: Never true  . Transportation needs    Medical: No    Non-medical: No  Tobacco Use  . Smoking status: Never Smoker  . Smokeless tobacco: Never Used  Substance and Sexual Activity  . Alcohol use: No    Alcohol/week: 0.0 standard drinks  . Drug use: No  . Sexual activity: Not Currently  Lifestyle  . Physical activity    Days per week: 0 days    Minutes per session: 0 min  . Stress: Very much  Relationships  . Social Herbalist on phone: Three times a week    Gets together: Never    Attends religious service: More than 4 times per year    Active member of club or organization: No    Attends meetings of clubs or organizations: Never    Relationship status: Not on file  Other Topics Concern  . Not on file  Social History Narrative  . Not on file    Allergies:  Allergies  Allergen Reactions  . Fentanyl Other (See Comments)    Other reaction(s): Headache, Other (See Comments) headache SEVERE HEADACHE headache  . Sumatriptan Other (See Comments)    Other reaction(s): Headache Severe headache  . Doxycycline Other (See Comments)    Other reaction(s): Headache, Other (See Comments) REACTION: causes severe abd pain REACTION: causes severe abd pain REACTION: causes severe abd pain  . Montelukast Other (See Comments)    SEVERE HEADACHE  . Oxymorphone Other (See Comments)    REACTION: severe headaches  . Benzoin Rash  . Buspirone Other (See Comments)    SEVERE HEADACHE  . Ciprofloxacin Nausea Only  . Conjugated Estrogens Other (See Comments)    headaches  . Cymbalta [Duloxetine Hcl] Other  (See Comments)    Headache  . Duloxetine Other (See Comments)    Headache  . Erythromycin Ethylsuccinate Other (See Comments)    Stomach ache  . Estradiol Other (See Comments)    headache  . Metronidazole Other (See Comments)    abd pain  . Mirtazapine Other (See Comments)    headache  Metabolic Disorder Labs: No results found for: HGBA1C, MPG No results found for: PROLACTIN Lab Results  Component Value Date   CHOL 220 (H) 01/10/2018   TRIG 268.0 (H) 01/10/2018   HDL 42.20 01/10/2018   CHOLHDL 5 01/10/2018   VLDL 53.6 (H) 01/10/2018   LDLCALC 121 (H) 07/12/2016   LDLCALC 187 (H) 08/03/2015   Lab Results  Component Value Date   TSH 1.78 02/23/2018   TSH 4.75 (H) 01/10/2018    Therapeutic Level Labs: No results found for: LITHIUM No results found for: VALPROATE No components found for:  CBMZ  Current Medications: Current Outpatient Medications  Medication Sig Dispense Refill  . albuterol (PROVENTIL) (2.5 MG/3ML) 0.083% nebulizer solution Take 2.5 mg by nebulization every 4 (four) hours as needed for wheezing or shortness of breath.    Marland Kitchen albuterol (VENTOLIN HFA) 108 (90 Base) MCG/ACT inhaler Inhale 2 puffs into the lungs every 4 (four) hours as needed for wheezing or shortness of breath. 1 Inhaler 3  . amLODipine (NORVASC) 5 MG tablet TAKE 1 TABLET BY MOUTH EVERY DAY 90 tablet 1  . bisacodyl (DULCOLAX) 5 MG EC tablet Take by mouth.    . budesonide-formoterol (SYMBICORT) 160-4.5 MCG/ACT inhaler Inhale 2 puffs into the lungs 2 (two) times daily. 1 Inhaler 12  . diazepam (VALIUM) 5 MG tablet Take 5 mg by mouth 3 (three) times daily as needed.      . docusate sodium (COLACE) 100 MG capsule Take 100 mg by mouth 2 (two) times daily.    Marland Kitchen GRALISE 600 MG TABS     . hydrochlorothiazide (HYDRODIURIL) 25 MG tablet Take 25 mg by mouth daily.  11  . metoprolol tartrate (LOPRESSOR) 50 MG tablet TAKE 1/2 TABLET BY MOUTH TWICE A DAY (NEED OFFICE VISIT) 90 tablet 0  . mometasone  (NASONEX) 50 MCG/ACT nasal spray Place into the nose.    . Multiple Vitamin (MULTIVITAMIN) capsule Take 1 capsule by mouth daily.      . Omega-3 Fatty Acids (FISH OIL) 1200 MG CAPS Take 1 capsule by mouth daily.    Marland Kitchen omeprazole (PRILOSEC) 40 MG capsule Take 1 capsule (40 mg total) by mouth at bedtime. 30 capsule 5  . OS-CAL CALCIUM + D3 500-200 MG-UNIT TABS TAKE 2 TABLETS BY MOUTH 2 (TWO) TIMES DAILY.  6  . Oxycodone HCl 10 MG TABS Take 10 mg by mouth 3 (three) times daily as needed.  0  . potassium chloride (K-DUR) 10 MEQ tablet TAKE 1 TABLET BY MOUTH TWICE A DAY 60 tablet 0  . pravastatin (PRAVACHOL) 10 MG tablet TAKE 1 TABLET (10 MG TOTAL) BY MOUTH DAILY. 90 tablet 2  . RELISTOR 150 MG TABS TAKE 3 TABLETS BY MOUTH ONCE A DAY prn  2  . TIROSINT 137 MCG CAPS TAKE 1 CAPSULE BY MOUTH DAILY BEFORE BREAKFAST. 30 capsule 2  . venlafaxine XR (EFFEXOR XR) 75 MG 24 hr capsule Take 3 capsules in the morning 90 capsule 1  . XTAMPZA ER 36 MG C12A TAKE 1 CAPSULE BY MOUTH EVERY 12 HOURS  0  . zolpidem (AMBIEN) 10 MG tablet Take 10 mg by mouth at bedtime as needed.       No current facility-administered medications for this visit.      Musculoskeletal: Strength & Muscle Tone: unable to assess due to telemed visit Gait & Station: unable to assess due to telemed visit Patient leans: unable to assess due to telemed visit   Psychiatric Specialty Exam: ROS  There were  no vitals taken for this visit.There is no height or weight on file to calculate BMI.  General Appearance: Disheveled and Fairly Groomed  Eye Contact:  Good  Speech:  Clear and Coherent and Normal Rate  Volume:  Normal  Mood:  Less depressed compared to last time.  Affect:  Congruent  Thought Process:  Goal Directed, Linear and Descriptions of Associations: Intact  Orientation:  Full (Time, Place, and Person)  Thought Content: Logical   Suicidal Thoughts:  No  Homicidal Thoughts:  No  Memory:  Recent;   Good Remote;   Good   Judgement:  Fair  Insight:  Fair  Psychomotor Activity:  Normal  Concentration:  Concentration: Good and Attention Span: Good  Recall:  Good  Fund of Knowledge: Good  Language: Good  Akathisia:  Negative  Handed:  Right  AIMS (if indicated): not done  Assets:  Communication Skills Desire for Improvement Housing Social Support Transportation  ADL's:  Intact  Cognition: WNL  Sleep:  Fair   Screenings: PHQ2-9     Office Visit from 04/30/2019 in [facility] from 03/13/2019 in [facility] Office Visit from 01/14/2016 in [facility]  PHQ-2 Total Score  4  0  5  PHQ-9 Total Score  14  -  17       Assessment and Plan: 58 year old female with depression and anxiety in addition to chronic pain now seen for follow-up. She reported feeling better after dose of Effexor was increased but still has low energy levels and some depressive symptoms.  She asked if she could restart Topamax as she was taking it in the past. Potential side effects of medication and risks vs benefits of treatment vs non-treatment were explained and discussed. All questions were answered.   1. MDD (major depressive disorder), recurrent, in partial remission ([facility])  - Continue venlafaxine XR (EFFEXOR XR) 75 MG 24 hr capsule; Take 3 capsules in the morning  Dispense: 90 capsule; Refill: 1 - Restart topiramate (TOPAMAX) 50 MG tablet; Take 1 tablet (50 mg total) by mouth 2 (two) times daily.  Dispense: 60 tablet; Refill: 1  2. Anxiety state  - venlafaxine XR (EFFEXOR XR) 75 MG 24 hr capsule; Take 3 capsules in the morning  Dispense: 90 capsule; Refill: 1   Pt is continuing to take Diazepam, Oxycodone and Ambien prescribed by her pain management specialist.  F/up in 8 weeks.  [place] Crane, MD 07/24/2019, 2:57 PM

## 2019-08-13 ENCOUNTER — Other Ambulatory Visit: Payer: Self-pay | Admitting: Pulmonary Disease

## 2019-08-13 ENCOUNTER — Other Ambulatory Visit: Payer: Self-pay

## 2019-08-13 ENCOUNTER — Encounter: Payer: Self-pay | Admitting: Pulmonary Disease

## 2019-08-13 ENCOUNTER — Ambulatory Visit (INDEPENDENT_AMBULATORY_CARE_PROVIDER_SITE_OTHER): Payer: Medicare Other | Admitting: Pulmonary Disease

## 2019-08-13 VITALS — BP 120/78 | HR 68 | Temp 97.0°F | Ht 67.0 in | Wt 195.8 lb

## 2019-08-13 DIAGNOSIS — K219 Gastro-esophageal reflux disease without esophagitis: Secondary | ICD-10-CM | POA: Diagnosis not present

## 2019-08-13 DIAGNOSIS — R0600 Dyspnea, unspecified: Secondary | ICD-10-CM

## 2019-08-13 DIAGNOSIS — J324 Chronic pansinusitis: Secondary | ICD-10-CM | POA: Diagnosis not present

## 2019-08-13 DIAGNOSIS — J4541 Moderate persistent asthma with (acute) exacerbation: Secondary | ICD-10-CM | POA: Diagnosis not present

## 2019-08-13 DIAGNOSIS — Z23 Encounter for immunization: Secondary | ICD-10-CM | POA: Diagnosis not present

## 2019-08-13 MED ORDER — TRELEGY ELLIPTA 100-62.5-25 MCG/INH IN AEPB: 1.0000 | INHALATION_SPRAY | Freq: Every day | RESPIRATORY_TRACT | 11 refills | Status: DC

## 2019-08-13 NOTE — Patient Instructions (Signed)
1.  We will switch your Symbicort to Trelegy Ellipta 1 inhalation daily, rinse your mouth well after you use it.  Use a little baking soda in the water when you rinse.  2.  We will refer you to Ear Nose and Throat (ENT) specialist to evaluate your chronic sinusitis.  3.  We will see you in follow-up in 3 months time, call sooner should any new difficulties arise.

## 2019-08-13 NOTE — Progress Notes (Deleted)
   Subjective:    Patient ID: [name], female    DOB: 12/10/1960, 58 y.o.   MRN: [id number]  HPI    Review of Systems     Objective:   Physical Exam        Assessment & Plan:

## 2019-08-13 NOTE — Progress Notes (Signed)
Subjective:    Patient ID: [name], female    DOB: 11-11-1960, 58 y.o.   MRN: [id number]  PATIENT PROFILE: History of dermatomyositis with evanescent pulmonary infiltrates Appears to be in remission Moderate-severe persistent asthma  Prior patient of Dr. Alva Garnet  DATA: 03/17/11 CT chest: Atelectasis versus infiltrate within the superior segments of the right upper and right lower lobes 11/16/15: Patchy infiltrate left upper lobe consistent with pneumonia 01/14/16 CXR: Patchy alveolar opacities in the left upper lobe 04/07/16 PFTs: mod-severe obstruction with substantial improvement after bronchodilator (FEV1 1.37 > 1.83 liters, 46-62% pred), mild restriction, normal DLCO 04/07/16 6MWT: 192 meters. More limited by LE weakness 04/20/16 HRCT chest: Extensive air trapping, indicating small airways disease. Mild diffuse bronchial wall thickening. Mild patchy regions of ground-glass attenuation in the mid to upper lungs bilaterally. 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 04/25/16 Office spiro: FEV1 2.15 liters (73%) 07/12/16 Respiratory symptoms deteriorated gradually since last course of prednisone. Progressive SOB, increased wheezing and rescue inhaler use X 2-3 weeks.  06/2017 - 01/2019 lost to follow up due to severe depression 03/15/19 PFTs: FVC: 2.64 > 2.80 L (71 > 76 %pred), FEV1: 1.96 > 2.12 L (68 > 74 %pred), FEV1/FVC: 74%, TLC: 5.11 L (92 %pred), DLCO 89 %pred  HPI Patient is a 58 year old lifelong never smoker previously followed by Dr. Merton Border, I am assuming care as Dr. Alva Garnet has left the practice.  Patient was last seen by Dr. Alva Garnet in August 2020.  At that time her moderate persistent asthma was under better control however she had difficulties with daily shortness of breath and still requiring albuterol inhaler at least twice per day.  Patient is currently on Symbicort and continues to have almost daily requirement of  albuterol.  She also has issues with chronic nasal congestion and purulent nasal discharge.  This is a recurrent issue for her.  I note that previously on multiple of Dr. Alva Garnet note there is mention of her requesting antibiotics for "sinusitis".  Patient currently has had no fevers, chills or sweats.  No hemoptysis, some purulence to sputum.  She does have occasional purulent nasal discharge.  She has had no orthopnea or paroxysmal nocturnal dyspnea.  She does note some increased dyspnea over baseline with wheezing.  She also has noted increase in in gastroesophageal reflux symptoms.  Dr. Jamal Collin had previously recommended ENT evaluation if her symptoms persisted.   Review of Systems A 10 point review of systems was performed and it is as noted above otherwise negative.    Objective:   Physical Exam BP 120/78 (BP Location: Left Arm, Patient Position: Sitting, Cuff Size: Large)   Pulse 68   Temp (!) 97 F (36.1 C) (Temporal)   Ht 5\' 7"  (1.702 m)   Wt 195 lb 12.8 oz (88.8 kg)   SpO2 93% Comment: on RA  BMI 30.67 kg/m   Gen: Well-developed overweight woman in no acute distress at rest in wheelchair HEENT: NCAT, sclerae anicteric, nose/mouth/throat not examined due to masking requirements for COVID 19. Neck: No JVD noted, trachea midline Lungs: breath sounds symmetrical with diffuse faint polyphonic wheezes Cardiovascular: RRR, no murmurs, rubs or gallops Abdomen: Soft, nondistended, slightly protuberant Ext: without clubbing, cyanosis, edema Neuro: No gross focal deficit, speech fluent, awake and alert, oriented Skin: Limited exam, no lesions noted      Assessment & Plan:  Moderate persistent asthma with mild exacerbation Chronic pansinusitis Dyspnea related to the above Referral to ENT Will switch Symbicort  to Trelegy Ellipta which he now has a indication for asthma Follow-up in 3 months time  Gastroesophageal reflux disease Continue omeprazole She was instructed on  antireflux measures May need evaluation by GI if symptoms persist.  Need for influenza vaccine Patient received a course of vaccine today  C. Derrill Kay, MD [facility]  *This note was dictated using voice recognition software/Dragon.  Despite best efforts to proofread, errors can occur which can change the meaning.  Any change was purely unintentional.

## 2019-08-20 ENCOUNTER — Other Ambulatory Visit: Payer: Self-pay | Admitting: Psychiatry

## 2019-08-20 DIAGNOSIS — F411 Generalized anxiety disorder: Secondary | ICD-10-CM

## 2019-08-29 DIAGNOSIS — J340 Abscess, furuncle and carbuncle of nose: Secondary | ICD-10-CM | POA: Diagnosis not present

## 2019-08-29 DIAGNOSIS — J328 Other chronic sinusitis: Secondary | ICD-10-CM | POA: Diagnosis not present

## 2019-08-29 DIAGNOSIS — G501 Atypical facial pain: Secondary | ICD-10-CM | POA: Diagnosis not present

## 2019-08-29 DIAGNOSIS — J3489 Other specified disorders of nose and nasal sinuses: Secondary | ICD-10-CM | POA: Diagnosis not present

## 2019-09-12 IMAGING — CR CHEST - 2 VIEW
1 series · 2 of 2 positions shown · non-contrast
Comparison: November 08, 2016

CLINICAL DATA: Asthma.

EXAM:
CHEST - 2 VIEW

[Series 1: w chest pa · 0.14mm/px · 2 of 2 slices shown]
[im 1/2]
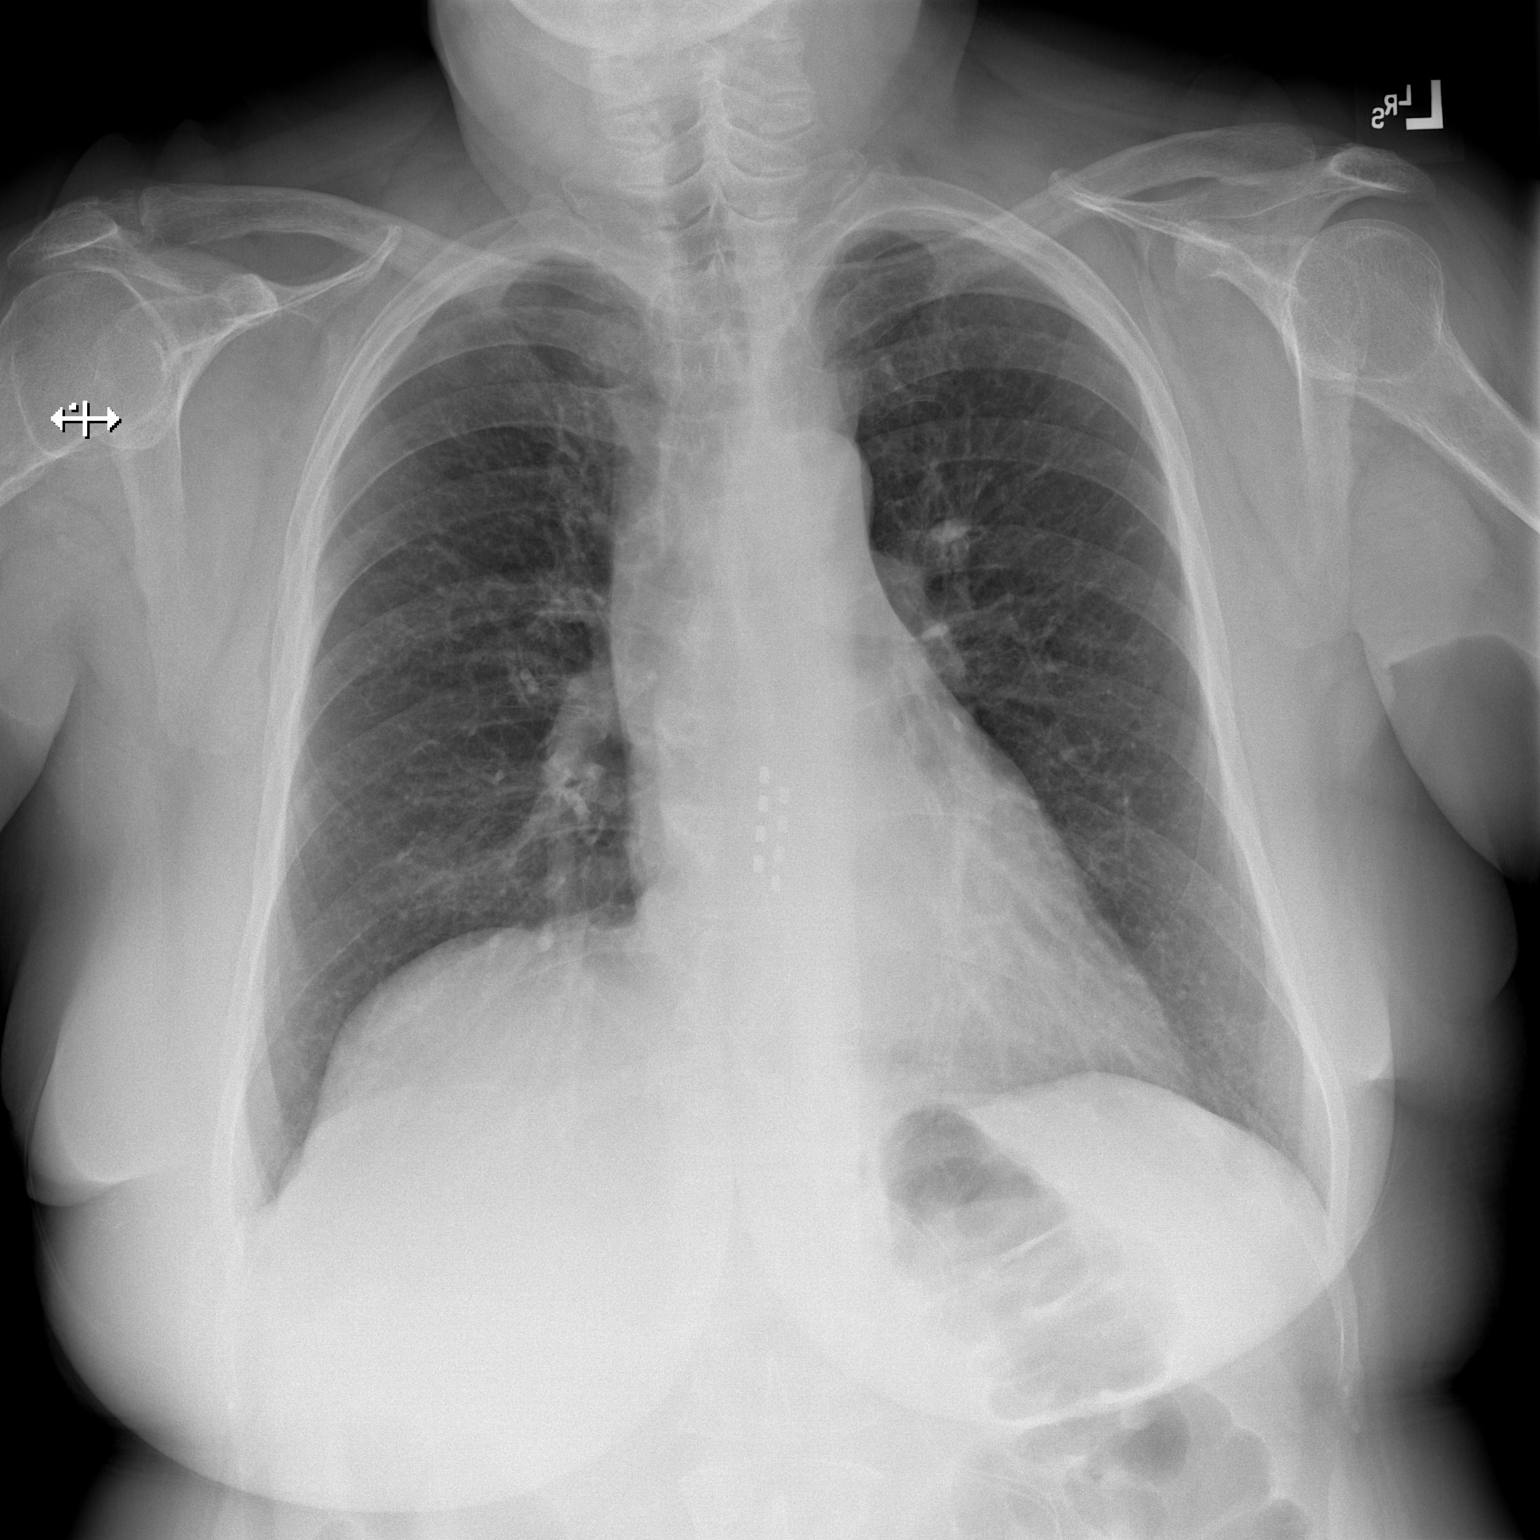
[im 2/2]
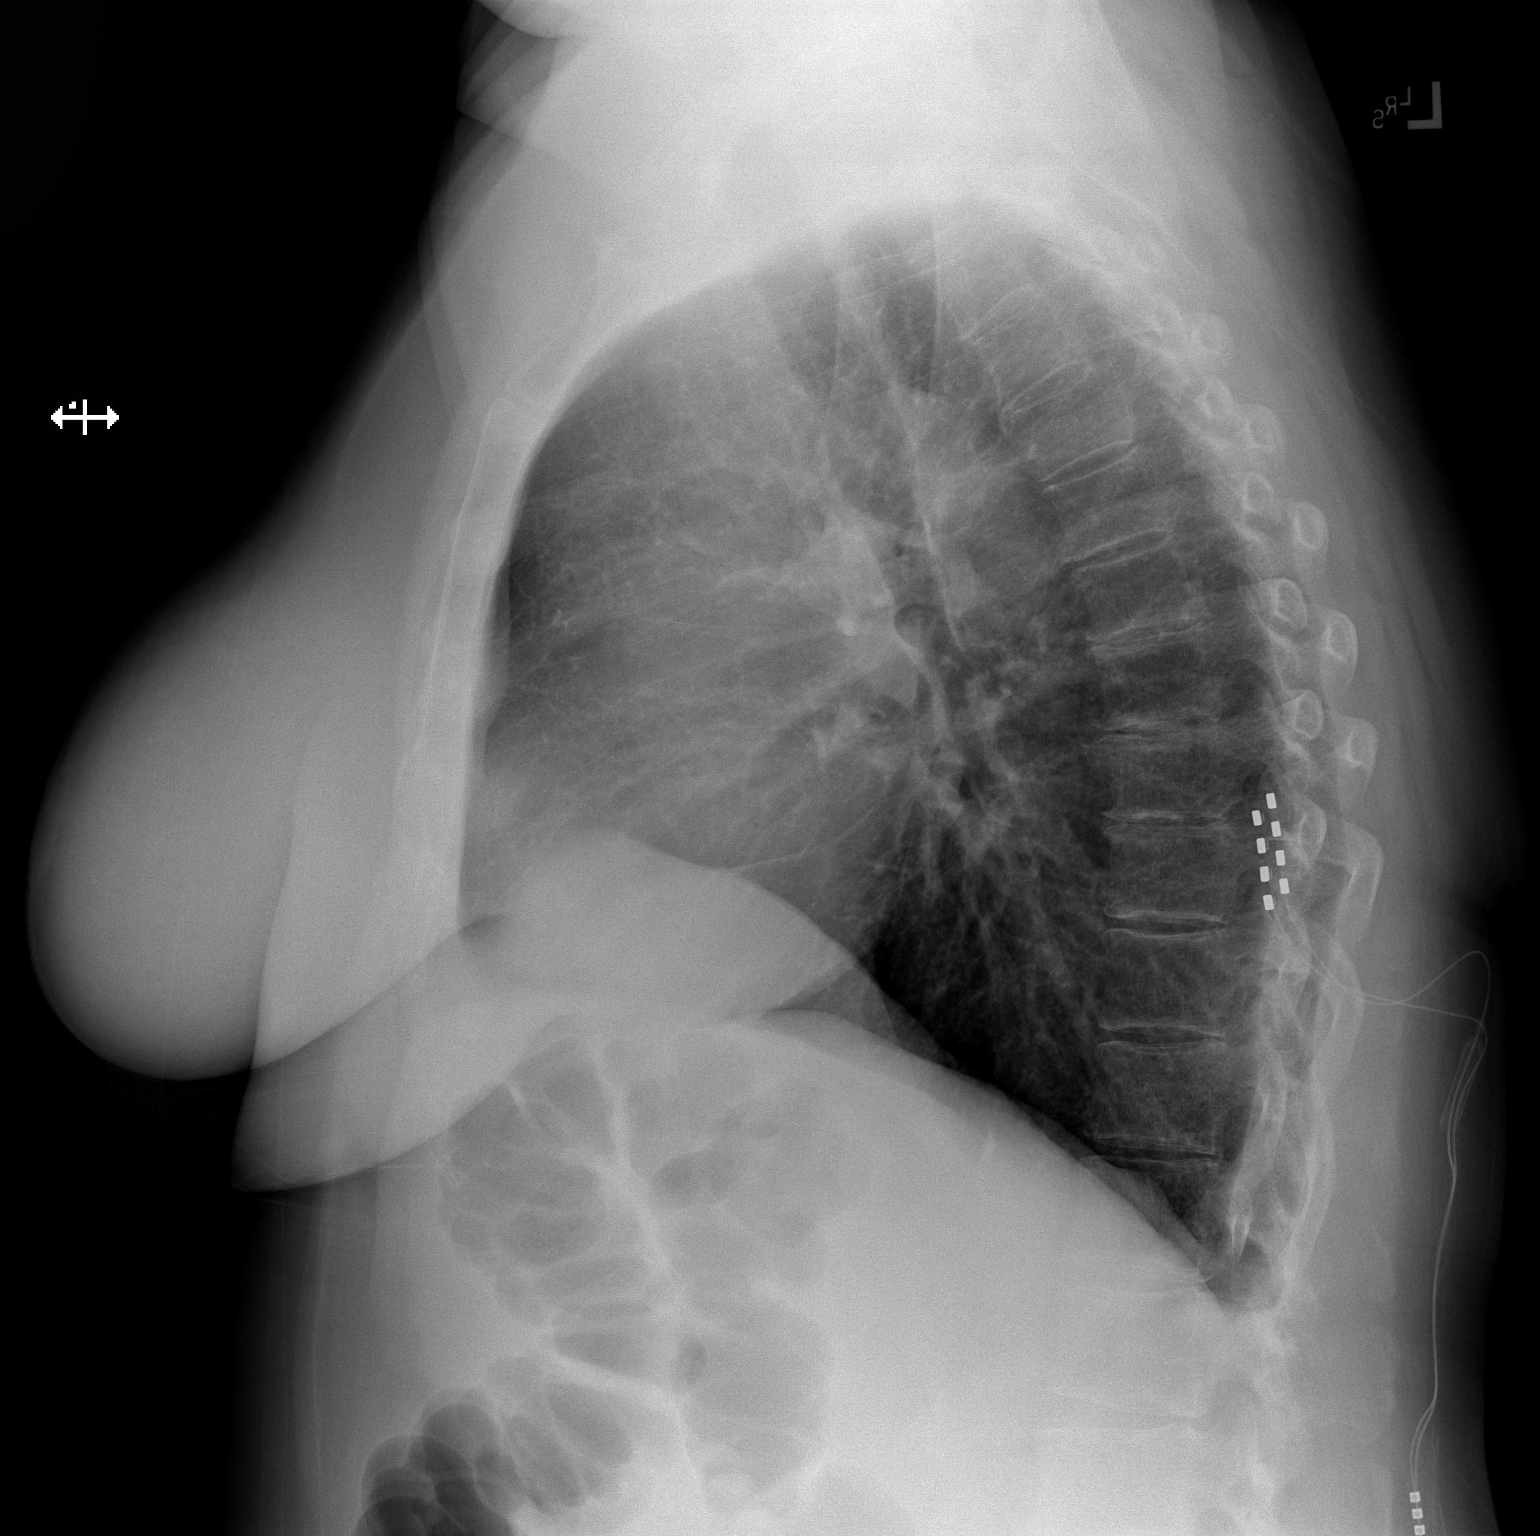

[2 of 2 positions shown; findings below may reference images not displayed]

FINDINGS: The heart size and mediastinal contours are within normal limits.
Both lungs are clear. The visualized skeletal structures are
unremarkable.
IMPRESSION: No active cardiopulmonary disease.

## 2019-09-17 ENCOUNTER — Ambulatory Visit: Payer: Medicare Other | Admitting: Gastroenterology

## 2019-09-20 ENCOUNTER — Other Ambulatory Visit: Payer: Self-pay

## 2019-09-20 ENCOUNTER — Ambulatory Visit: Payer: Medicare Other | Admitting: Psychiatry

## 2019-09-27 ENCOUNTER — Other Ambulatory Visit: Payer: Self-pay | Admitting: Internal Medicine

## 2019-09-30 ENCOUNTER — Telehealth: Payer: Self-pay

## 2019-09-30 DIAGNOSIS — F3341 Major depressive disorder, recurrent, in partial remission: Secondary | ICD-10-CM

## 2019-09-30 DIAGNOSIS — F411 Generalized anxiety disorder: Secondary | ICD-10-CM

## 2019-09-30 NOTE — Telephone Encounter (Signed)
Pt needs refills on medications. 

## 2019-10-01 MED ORDER — VENLAFAXINE HCL ER 75 MG PO CP24: ORAL_CAPSULE | ORAL | 1 refills | Status: DC

## 2019-10-01 MED ORDER — TOPIRAMATE 50 MG PO TABS: 50.0000 mg | ORAL_TABLET | Freq: Two times a day (BID) | ORAL | 1 refills | Status: DC

## 2019-10-01 NOTE — Telephone Encounter (Signed)
Done

## 2019-10-10 ENCOUNTER — Ambulatory Visit: Payer: Medicare Other | Admitting: Gastroenterology

## 2019-10-14 ENCOUNTER — Other Ambulatory Visit: Payer: Self-pay | Admitting: Internal Medicine

## 2019-10-15 ENCOUNTER — Other Ambulatory Visit: Payer: Self-pay | Admitting: Otolaryngology

## 2019-10-15 ENCOUNTER — Ambulatory Visit: Payer: Medicare Other | Admitting: Psychiatry

## 2019-10-15 DIAGNOSIS — J324 Chronic pansinusitis: Secondary | ICD-10-CM

## 2019-10-15 DIAGNOSIS — J3489 Other specified disorders of nose and nasal sinuses: Secondary | ICD-10-CM | POA: Diagnosis not present

## 2019-10-15 DIAGNOSIS — J328 Other chronic sinusitis: Secondary | ICD-10-CM | POA: Diagnosis not present

## 2019-10-21 DIAGNOSIS — G894 Chronic pain syndrome: Secondary | ICD-10-CM | POA: Diagnosis not present

## 2019-10-21 DIAGNOSIS — Z79891 Long term (current) use of opiate analgesic: Secondary | ICD-10-CM | POA: Diagnosis not present

## 2019-10-23 ENCOUNTER — Ambulatory Visit: Payer: Medicare Other

## 2019-10-23 ENCOUNTER — Encounter: Payer: Self-pay | Admitting: Pulmonary Disease

## 2019-10-29 ENCOUNTER — Encounter: Payer: Self-pay | Admitting: Psychiatry

## 2019-10-29 ENCOUNTER — Ambulatory Visit (INDEPENDENT_AMBULATORY_CARE_PROVIDER_SITE_OTHER): Payer: Medicare Other | Admitting: Psychiatry

## 2019-10-29 ENCOUNTER — Other Ambulatory Visit: Payer: Self-pay

## 2019-10-29 DIAGNOSIS — F411 Generalized anxiety disorder: Secondary | ICD-10-CM | POA: Diagnosis not present

## 2019-10-29 DIAGNOSIS — F3341 Major depressive disorder, recurrent, in partial remission: Secondary | ICD-10-CM

## 2019-10-29 MED ORDER — VENLAFAXINE HCL ER 75 MG PO CP24: ORAL_CAPSULE | ORAL | 0 refills | Status: DC

## 2019-10-29 NOTE — Progress Notes (Signed)
[facility] MD OP Progress Note  I connected with  HOPE ROSENDO on 10/29/19 by phone and verified that I am speaking with the correct person using two identifiers.   I discussed the limitations of evaluation and management by phone. The patient expressed understanding and agreed to proceed.    10/29/2019 3:43 PM ALEESA GRANER  MRN:  [id number]  Chief Complaint:  " I am not doing well at all."  HPI: Patient stated that her mother is not doing well at all.  She elaborated in great detail the series of event that have been ongoing with her mother since Thanksgiving.  She informed that her mother has been diagnosed with a terminal illness and the whole family is very concerned.  Patient stated that it is a very difficult time for her.  She spoke about her relationship with her mother and how this will be a very challenging time for her.   She informed that when Topamax was restarted for her at the time of her last visit she developed hallucinations soon after taking it.  She stated that she was seeing things that were not there and the floor looked strange.  Her husband cut down the dose to half for a week and then discontinued it.  Patient stated that her visual hallucinations improved as soon as she stopped taking the medicine.  She is no longer having any kind of hallucinations anymore. She asked if there was any medication that she can take in case she gets bad news about her mother as anything can happen anytime now.  Writer reviewed her medications and noted that she already is on quite a few medications.  Writer recommended that she continues taking her Valium which she takes for muscle spasms as it can help with extreme anxiety as well.  Patient currently takes Valium 5 mg 3 times a day along with Ambien for sleep and oxycodone for chronic pain. Patient was provided with much amount of reassurance and supportive psychotherapy was provided.   Visit Diagnosis:    ICD-10-CM   1. MDD (major depressive  disorder), recurrent, in partial remission ([place])  F33.41   2. Anxiety state  F41.1     Past Psychiatric History: See above  Past Medical History:  Past Medical History:  Diagnosis Date  . Allergic rhinitis   . Anxiety   . Arthralgia   . Asthma   . Bronchitis, chronic ([place])   . Chronic back pain   . Collagenous colitis    followed by Dr Tiffany Kocher  . Depression   . Diplopia 11/15/2013  . Dysphagia   . Endometriosis   . Fibrocystic breast disease   . GERD (gastroesophageal reflux disease)   . History of colon polyps   . Hyperlipidemia   . Hypertension   . Hypokalemia   . IBS (irritable bowel syndrome)   . Memory difficulty 11/15/2013  . Migraine headache   . Pneumonia 2017   x2  . PONV (postoperative nausea and vomiting)    used patch  . Sleep apnea    recommended CPAP  . Ulcer disease   . Urine incontinence     Past Surgical History:  Procedure Laterality Date  . ABDOMINAL HYSTERECTOMY     endometriosis  . BACK SURGERY     x6  . BREAST BIOPSY Right   . COLONOSCOPY WITH PROPOFOL N/A 06/25/2015   Procedure: COLONOSCOPY WITH PROPOFOL;  Surgeon: Manya Silvas, MD;  Location[facility] ENDOSCOPY;  Service: Endoscopy;  Laterality: N/A;  .  DILATION AND CURETTAGE OF UTERUS     and hysteroscopy  . ESOPHAGOGASTRODUODENOSCOPY N/A 06/25/2015   Procedure: ESOPHAGOGASTRODUODENOSCOPY (EGD);  Surgeon: Manya Silvas, MD;  Location: [facility] ENDOSCOPY;  Service: Endoscopy;  Laterality: N/A;  . insertion of permanent spinal cord stimulator    . insertion of trial spinal cord stimulator    . LUMBAR DISC SURGERY    . LUMBAR FUSION  4/05 and 8/07  . LUMBAR MICRODISCECTOMY  03/28/03   L4-5 L5  . MOUTH SURGERY    . NASAL SEPTOPLASTY W/ TURBINOPLASTY    . removal of spinal cord stimulator    . SEPTOPLASTY     with reduction of turbinates  . THYROIDECTOMY N/A 06/06/2016   Procedure: THYROIDECTOMY;  Surgeon: Robert Bellow, MD;  Location: [facility] ORS;  Service: General;  Laterality: N/A;     Family Psychiatric History: Denied  Family History:  Family History  Problem Relation Age of Onset  . Allergies Mother   . Hypertension Mother   . Arthritis Mother   . Hyperlipidemia Mother   . Stroke Mother   . Hypertension Father   . Arthritis Father   . Diabetes Father   . Hypertension Brother   . Diabetes Brother   . Arthritis Maternal Grandmother   . Hyperlipidemia Maternal Grandmother   . Hyperlipidemia Maternal Grandfather   . Arthritis Paternal Grandmother   . Arthritis Paternal Grandfather   . Asthma Other        several family member  . Leukemia Other        uncle  . Lymphoma Maternal Aunt        non hodgkins    Social History:  Social History   Socioeconomic History  . Marital status: Married    Spouse name: douglas  . Number of children: 1  . Years of education: 46 th  . Highest education level: High school graduate  Occupational History  . Occupation: Disabled    Employer: DISABLED  Tobacco Use  . Smoking status: Never Smoker  . Smokeless tobacco: Never Used  Substance and Sexual Activity  . Alcohol use: No    Alcohol/week: 0.0 standard drinks  . Drug use: No  . Sexual activity: Not Currently  Other Topics Concern  . Not on file  Social History Narrative  . Not on file   Social Determinants of Health   Financial Resource Strain:   . Difficulty of Paying Living Expenses:   Food Insecurity:   . Worried About Charity fundraiser in the Last Year:   . Arboriculturist in the Last Year:   Transportation Needs:   . Film/video editor (Medical):   Marland Kitchen Lack of Transportation (Non-Medical):   Physical Activity:   . Days of Exercise per Week:   . Minutes of Exercise per Session:   Stress:   . Feeling of Stress :   Social Connections:   . Frequency of Communication with Friends and Family:   . Frequency of Social Gatherings with Friends and Family:   . Attends Religious Services:   . Active Member of Clubs or Organizations:   . Attends  Archivist Meetings:   Marland Kitchen Marital Status:     Allergies:  Allergies  Allergen Reactions  . Fentanyl Other (See Comments)    Other reaction(s): Headache, Other (See Comments) headache SEVERE HEADACHE headache  . Sumatriptan Other (See Comments)    Other reaction(s): Headache Severe headache  . Doxycycline Other (See Comments)    Other  reaction(s): Headache, Other (See Comments) REACTION: causes severe abd pain REACTION: causes severe abd pain REACTION: causes severe abd pain  . Montelukast Other (See Comments)    SEVERE HEADACHE  . Oxymorphone Other (See Comments)    REACTION: severe headaches  . Benzoin Rash  . Buspirone Other (See Comments)    SEVERE HEADACHE  . Ciprofloxacin Nausea Only  . Conjugated Estrogens Other (See Comments)    headaches  . Cymbalta [Duloxetine Hcl] Other (See Comments)    Headache  . Duloxetine Other (See Comments)    Headache  . Erythromycin Ethylsuccinate Other (See Comments)    Stomach ache  . Estradiol Other (See Comments)    headache  . Metronidazole Other (See Comments)    abd pain  . Mirtazapine Other (See Comments)    headache    Metabolic Disorder Labs: No results found for: HGBA1C, MPG No results found for: PROLACTIN Lab Results  Component Value Date   CHOL 220 (H) 01/10/2018   TRIG 268.0 (H) 01/10/2018   HDL 42.20 01/10/2018   CHOLHDL 5 01/10/2018   VLDL 53.6 (H) 01/10/2018   LDLCALC 121 (H) 07/12/2016   LDLCALC 187 (H) 08/03/2015   Lab Results  Component Value Date   TSH 1.78 02/23/2018   TSH 4.75 (H) 01/10/2018    Therapeutic Level Labs: No results found for: LITHIUM No results found for: VALPROATE No components found for:  CBMZ  Current Medications: Current Outpatient Medications  Medication Sig Dispense Refill  . albuterol (PROVENTIL) (2.5 MG/3ML) 0.083% nebulizer solution Take 2.5 mg by nebulization every 4 (four) hours as needed for wheezing or shortness of breath.    Marland Kitchen albuterol (VENTOLIN HFA)  108 (90 Base) MCG/ACT inhaler Inhale 2 puffs into the lungs every 4 (four) hours as needed for wheezing or shortness of breath. 1 Inhaler 3  . amLODipine (NORVASC) 5 MG tablet TAKE 1 TABLET BY MOUTH EVERY DAY 90 tablet 1  . bisacodyl (DULCOLAX) 5 MG EC tablet Take by mouth.    . diazepam (VALIUM) 5 MG tablet Take 5 mg by mouth 3 (three) times daily as needed.      . docusate sodium (COLACE) 100 MG capsule Take 100 mg by mouth 2 (two) times daily.    . Fluticasone-Umeclidin-Vilant (TRELEGY ELLIPTA) 100-62.5-25 MCG/INH AEPB Inhale 1 puff into the lungs daily. 1 each 11  . GRALISE 600 MG TABS     . hydrochlorothiazide (HYDRODIURIL) 25 MG tablet Take 25 mg by mouth daily.  11  . metoprolol tartrate (LOPRESSOR) 50 MG tablet TAKE 1/2 TABLET BY MOUTH TWICE A DAY (NEED OFFICE VISIT) 90 tablet 0  . Multiple Vitamin (MULTIVITAMIN) capsule Take 1 capsule by mouth daily.      . Omega-3 Fatty Acids (FISH OIL) 1200 MG CAPS Take 1 capsule by mouth daily.    Marland Kitchen omeprazole (PRILOSEC) 40 MG capsule TAKE 1 CAPSULE (40 MG TOTAL) BY MOUTH AT BEDTIME. 90 capsule 1  . OS-CAL CALCIUM + D3 500-200 MG-UNIT TABS TAKE 2 TABLETS BY MOUTH 2 (TWO) TIMES DAILY.  6  . Oxycodone HCl 10 MG TABS Take 10 mg by mouth 3 (three) times daily as needed.  0  . potassium chloride (K-DUR) 10 MEQ tablet TAKE 1 TABLET BY MOUTH TWICE A DAY 60 tablet 0  . pravastatin (PRAVACHOL) 10 MG tablet TAKE 1 TABLET (10 MG TOTAL) BY MOUTH DAILY. 90 tablet 2  . TIROSINT 137 MCG CAPS TAKE 1 CAPSULE BY MOUTH DAILY BEFORE BREAKFAST. 30 capsule 2  .  topiramate (TOPAMAX) 50 MG tablet Take 1 tablet (50 mg total) by mouth 2 (two) times daily. 60 tablet 1  . venlafaxine XR (EFFEXOR XR) 75 MG 24 hr capsule Take 3 capsules in the morning 90 capsule 1  . XTAMPZA ER 36 MG C12A TAKE 1 CAPSULE BY MOUTH EVERY 12 HOURS  0  . zolpidem (AMBIEN) 10 MG tablet Take 10 mg by mouth at bedtime as needed.       No current facility-administered medications for this visit.      Musculoskeletal: Strength & Muscle Tone: unable to assess due to telemed visit Gait & Station: unable to assess due to telemed visit Patient leans: unable to assess due to telemed visit   Psychiatric Specialty Exam: ROS  There were no vitals taken for this visit.There is no height or weight on file to calculate BMI.  General Appearance: unable to assess due to phone visit  Eye Contact:  unable to assess due to phone visit  Speech:  Clear and Coherent and Normal Rate  Volume:  Normal  Mood:  Less depressed compared to last time.  Affect:  Congruent  Thought Process:  Goal Directed, Linear and Descriptions of Associations: Intact  Orientation:  Full (Time, Place, and Person)  Thought Content: Logical   Suicidal Thoughts:  No  Homicidal Thoughts:  No  Memory:  Recent;   Good Remote;   Good  Judgement:  Fair  Insight:  Fair  Psychomotor Activity:  Normal  Concentration:  Concentration: Good and Attention Span: Good  Recall:  Good  Fund of Knowledge: Good  Language: Good  Akathisia:  Negative  Handed:  Right  AIMS (if indicated): not done  Assets:  Communication Skills Desire for Improvement Housing Social Support Transportation  ADL's:  Intact  Cognition: WNL  Sleep:  Fair   Screenings: PHQ2-9     Office Visit from 04/30/2019 in [place] from 03/13/2019 in [place] Office Visit from 01/14/2016 in [place]  PHQ-2 Total Score  4  0  5  PHQ-9 Total Score  14  --  17       Assessment and Plan: 59 year old female with depression and anxiety in addition to chronic pain now seen for follow-up.  Patient developed visual hallucinations after she restarted Topamax which she discontinued after a few days.  Her visual hallucinations have since resolved.  Patient did report feeling distressed due to her mother being terminally ill.    1. MDD (major depressive disorder), recurrent, in partial  remission ([facility])  - Continue venlafaxine XR (EFFEXOR XR) 75 MG 24 hr capsule; Take 3 capsules in the morning  Dispense: 90 capsule; Refill: 1   2. Anxiety state  - venlafaxine XR (EFFEXOR XR) 75 MG 24 hr capsule; Take 3 capsules in the morning  Dispense: 90 capsule; Refill: 1   Pt is continuing to take Diazepam, Oxycodone and Ambien prescribed by her pain management specialist.  Much supportive psychotherapy was provided. F/up in 6 weeks.  [place] Crane, MD 10/29/2019, 3:43 PM

## 2019-11-07 ENCOUNTER — Ambulatory Visit: Payer: Medicare Other | Admitting: Pulmonary Disease

## 2019-11-12 ENCOUNTER — Ambulatory Visit: Payer: Medicare Other

## 2019-11-13 ENCOUNTER — Encounter: Payer: Self-pay | Admitting: Primary Care

## 2019-11-13 ENCOUNTER — Ambulatory Visit (INDEPENDENT_AMBULATORY_CARE_PROVIDER_SITE_OTHER): Payer: Medicare Other | Admitting: Primary Care

## 2019-11-13 DIAGNOSIS — J329 Chronic sinusitis, unspecified: Secondary | ICD-10-CM

## 2019-11-13 DIAGNOSIS — J0191 Acute recurrent sinusitis, unspecified: Secondary | ICD-10-CM

## 2019-11-13 DIAGNOSIS — J4541 Moderate persistent asthma with (acute) exacerbation: Secondary | ICD-10-CM

## 2019-11-13 MED ORDER — MONTELUKAST SODIUM 10 MG PO TABS: 10.0000 mg | ORAL_TABLET | Freq: Every day | ORAL | 0 refills | Status: DC

## 2019-11-13 MED ORDER — CEFDINIR 300 MG PO CAPS: 300.0000 mg | ORAL_CAPSULE | Freq: Two times a day (BID) | ORAL | 0 refills | Status: DC

## 2019-11-13 MED ORDER — PREDNISONE 10 MG PO TABS: ORAL_TABLET | ORAL | 0 refills | Status: DC

## 2019-11-13 NOTE — Progress Notes (Signed)
Virtual Visit via Telephone Note  I connected with [name] on 11/13/19 at  3:00 PM EDT by telephone and verified that I am speaking with the correct person using two identifiers.  Location: Patient: Home Provider: Office   I discussed the limitations, risks, security and privacy concerns of performing an evaluation and management service by telephone and the availability of in person appointments. I also discussed with the patient that there may be a patient responsible charge related to this service. The patient expressed understanding and agreed to proceed.   History of Present Illness: 59 year old female, never smoked. PMH HTN, allergic rhinitis, sleep apnea, asthma, GERD. Patient of Dr. Patsey Berthold. Maintained on Trelegy 1 puff daily.   11/13/2019 Patient contacted today for follow-up. States that she has done really well on Trelegy. Recent exacerbation in breathing d.t recurrent sinusitis. Reports increasing wheezing the last week with associated shortness of breath. She has been getting up a lot of colored/purulent mucus. She has been using saline nasal spray. Not currently on antihistamines or nasal steriods. She has had a lot of trouble with her sinuses over the last several months. She has been seen by ENT and is scheduled for CT sinuses.    Observations/Objective:  - Able to speak full sentences without shortness of breath or overt wheezing - Some audible nasal congestion  Assessment and Plan:  Acute on chronic sinusitis: - RX omnicef 1 tab capsule twice daily x 7 days and prednisone taper as directed  - Start montelukast 10mg  daily  - FU with ENT   Asthma: - Overall improved on Trelegy  - Continue Trelegy 1 puff daily; prn albuterol hfa/neb q 6 hours for sob/wheezing   Follow Up Instructions:   - 3 months with Dr. Patsey Berthold   I discussed the assessment and treatment plan with the patient. The patient was provided an opportunity to ask questions and all were answered.  The patient agreed with the plan and demonstrated an understanding of the instructions.   The patient was advised to call back or seek an in-person evaluation if the symptoms worsen or if the condition fails to improve as anticipated.  I provided 18 minutes of non-face-to-face time during this encounter.   Martyn Ehrich, NP

## 2019-11-15 NOTE — Patient Instructions (Signed)
Sinusitis with asthma exacerbation - RX omnicef 1 tab twice daily x 7 days and prednisone taper - Start singulair 10mg  daily

## 2019-12-04 ENCOUNTER — Ambulatory Visit
Admission: RE | Admit: 2019-12-04 | Discharge: 2019-12-04 | Disposition: A | Discharge status: Home / Self Care | Payer: Medicare Other | Source: Ambulatory Visit | Attending: Otolaryngology | Admitting: Otolaryngology

## 2019-12-04 ENCOUNTER — Other Ambulatory Visit: Payer: Self-pay

## 2019-12-04 DIAGNOSIS — J324 Chronic pansinusitis: Secondary | ICD-10-CM | POA: Insufficient documentation

## 2019-12-09 DIAGNOSIS — J3489 Other specified disorders of nose and nasal sinuses: Secondary | ICD-10-CM | POA: Diagnosis not present

## 2019-12-10 ENCOUNTER — Telehealth: Payer: Medicare Other | Admitting: Psychiatry

## 2019-12-10 ENCOUNTER — Other Ambulatory Visit: Payer: Self-pay

## 2019-12-17 ENCOUNTER — Other Ambulatory Visit: Payer: Self-pay

## 2019-12-17 ENCOUNTER — Ambulatory Visit (INDEPENDENT_AMBULATORY_CARE_PROVIDER_SITE_OTHER): Payer: Medicare Other | Admitting: Gastroenterology

## 2019-12-17 ENCOUNTER — Encounter: Payer: Self-pay | Admitting: Gastroenterology

## 2019-12-17 VITALS — BP 92/61 | HR 71 | Temp 98.1°F | Ht 67.0 in | Wt 196.4 lb

## 2019-12-17 DIAGNOSIS — K5903 Drug induced constipation: Secondary | ICD-10-CM

## 2019-12-17 DIAGNOSIS — R1319 Other dysphagia: Secondary | ICD-10-CM

## 2019-12-17 DIAGNOSIS — R1310 Dysphagia, unspecified: Secondary | ICD-10-CM | POA: Diagnosis not present

## 2019-12-17 NOTE — Progress Notes (Signed)
Gastroenterology Consultation  Referring Provider:     Einar Pheasant, MD Primary Care Physician:  Einar Pheasant, MD Primary Gastroenterologist:  Dr. Allen Norris     Reason for Consultation:     Constipation and dysphagia        HPI:   [name] is a 59 y.o. y/o female referred for consultation & management of constipation and dysphagia by Dr. Einar Pheasant, MD.  This patient comes in today after being seen by Dr. Vira Agar in the past.  The patient had a EGD and colonoscopy in 2016.  At that time the patient had dilation of the esophagus which she states did nothing to help her swallowing.  The patient also had a colonoscopy with 2 small diminutive polyps that were removed and shown to be tubular adenomas.  The patient reports that her dysphagia is both to solids and liquids.  There is no report of any unexplained weight loss.  The patient also suffers from significant constipation and takes medication on a daily basis to help with her constipation.  She states the constipation is related to her use of narcotics due to chronic back pain.  She says that she has had back pain for 20 years.  The patient is followed by pain management for her history of chronic pain.  She reports that she takes both Dulcolax and Colace at night before she goes to sleep.  Past Medical History:  Diagnosis Date  . Allergic rhinitis   . Anxiety   . Arthralgia   . Asthma   . Bronchitis, chronic ([place])   . Chronic back pain   . Collagenous colitis    followed by Dr Tiffany Kocher  . Depression   . Diplopia 11/15/2013  . Dysphagia   . Endometriosis   . Fibrocystic breast disease   . GERD (gastroesophageal reflux disease)   . History of colon polyps   . Hyperlipidemia   . Hypertension   . Hypokalemia   . IBS (irritable bowel syndrome)   . Memory difficulty 11/15/2013  . Migraine headache   . Pneumonia 2017   x2  . PONV (postoperative nausea and vomiting)    used patch  . Sleep apnea    recommended CPAP  . Ulcer  disease   . Urine incontinence     Past Surgical History:  Procedure Laterality Date  . ABDOMINAL HYSTERECTOMY     endometriosis  . BACK SURGERY     x6  . BREAST BIOPSY Right   . COLONOSCOPY WITH PROPOFOL N/A 06/25/2015   Procedure: COLONOSCOPY WITH PROPOFOL;  Surgeon: Manya Silvas, MD;  Location: [facility] ENDOSCOPY;  Service: Endoscopy;  Laterality: N/A;  . DILATION AND CURETTAGE OF UTERUS     and hysteroscopy  . ESOPHAGOGASTRODUODENOSCOPY N/A 06/25/2015   Procedure: ESOPHAGOGASTRODUODENOSCOPY (EGD);  Surgeon: Manya Silvas, MD;  Location: [facility] ENDOSCOPY;  Service: Endoscopy;  Laterality: N/A;  . insertion of permanent spinal cord stimulator    . insertion of trial spinal cord stimulator    . LUMBAR DISC SURGERY    . LUMBAR FUSION  4/05 and 8/07  . LUMBAR MICRODISCECTOMY  03/28/03   L4-5 L5  . MOUTH SURGERY    . NASAL SEPTOPLASTY W/ TURBINOPLASTY    . removal of spinal cord stimulator    . SEPTOPLASTY     with reduction of turbinates  . THYROIDECTOMY N/A 06/06/2016   Procedure: THYROIDECTOMY;  Surgeon: Robert Bellow, MD;  Location: [facility] ORS;  Service: General;  Laterality: N/A;  Prior to Admission medications   Medication Sig Start Date End Date Taking? Authorizing Provider  albuterol (PROVENTIL) (2.5 MG/3ML) 0.083% nebulizer solution Take 2.5 mg by nebulization every 4 (four) hours as needed for wheezing or shortness of breath.   Yes [provider]  albuterol (VENTOLIN HFA) 108 (90 Base) MCG/ACT inhaler Inhale 2 puffs into the lungs every 4 (four) hours as needed for wheezing or shortness of breath. 01/14/19  Yes Wilhelmina Mcardle, MD  amLODipine (NORVASC) 5 MG tablet TAKE 1 TABLET BY MOUTH EVERY DAY 06/26/19  Yes Einar Pheasant, MD  bisacodyl (DULCOLAX) 5 MG EC tablet Take by mouth.   Yes [provider]  diazepam (VALIUM) 5 MG tablet Take 5 mg by mouth 3 (three) times daily as needed.     Yes [provider]  docusate sodium (COLACE) 100  MG capsule Take 100 mg by mouth 2 (two) times daily.   Yes [provider]  Fluticasone-Umeclidin-Vilant (TRELEGY ELLIPTA) 100-62.5-25 MCG/INH AEPB Inhale 1 puff into the lungs daily. 08/13/19  Yes Tyler Pita, MD  GRALISE 600 MG TABS  12/22/17  Yes [provider]  hydrochlorothiazide (HYDRODIURIL) 25 MG tablet Take 25 mg by mouth daily. 12/12/16  Yes [provider]  metoprolol tartrate (LOPRESSOR) 50 MG tablet TAKE 1/2 TABLET BY MOUTH TWICE A DAY (NEED OFFICE VISIT) 10/14/19  Yes Einar Pheasant, MD  montelukast (SINGULAIR) 10 MG tablet Take 1 tablet (10 mg total) by mouth at bedtime. (notify office for refill if tolerates) 11/13/19  Yes Martyn Ehrich, NP  Multiple Vitamin (MULTIVITAMIN) capsule Take 1 capsule by mouth daily.     Yes [provider]  Omega-3 Fatty Acids (FISH OIL) 1200 MG CAPS Take 1 capsule by mouth daily.   Yes [provider]  omeprazole (PRILOSEC) 40 MG capsule TAKE 1 CAPSULE (40 MG TOTAL) BY MOUTH AT BEDTIME. 08/14/19 08/13/20 Yes Tyler Pita, MD  OS-CAL CALCIUM + D3 500-200 MG-UNIT TABS TAKE 2 TABLETS BY MOUTH 2 (TWO) TIMES DAILY. 01/23/17  Yes [provider]  Oxycodone HCl 10 MG TABS Take 10 mg by mouth 3 (three) times daily as needed. 04/02/15  Yes [provider]  potassium chloride (K-DUR) 10 MEQ tablet TAKE 1 TABLET BY MOUTH TWICE A DAY 03/18/19  Yes Einar Pheasant, MD  pravastatin (PRAVACHOL) 10 MG tablet TAKE 1 TABLET (10 MG TOTAL) BY MOUTH DAILY. 05/13/19  Yes Einar Pheasant, MD  TIROSINT 137 MCG CAPS TAKE 1 CAPSULE BY MOUTH DAILY BEFORE BREAKFAST. 09/27/19  Yes Einar Pheasant, MD  venlafaxine XR (EFFEXOR XR) 75 MG 24 hr capsule Take 3 capsules in the morning 10/29/19  Yes Nevada Crane, MD  XTAMPZA ER 36 MG C12A TAKE 1 CAPSULE BY MOUTH EVERY 12 HOURS 12/25/15  Yes [provider]  zolpidem (AMBIEN) 10 MG tablet Take 10 mg by mouth at bedtime as needed.     Yes [provider]    triamcinolone (NASACORT AQ) 55 MCG/ACT AERO nasal inhaler Place 2 sprays into the nose 2 (two) times daily. 1 sprays each nostril twice a day 06/02/16 06/02/16  Robert Bellow, MD    Family History  Problem Relation Age of Onset  . Allergies Mother   . Hypertension Mother   . Arthritis Mother   . Hyperlipidemia Mother   . Stroke Mother   . Hypertension Father   . Arthritis Father   . Diabetes Father   . Hypertension Brother   . Diabetes Brother   .  Arthritis Maternal Grandmother   . Hyperlipidemia Maternal Grandmother   . Hyperlipidemia Maternal Grandfather   . Arthritis Paternal Grandmother   . Arthritis Paternal Grandfather   . Asthma Other        several family member  . Leukemia Other        uncle  . Lymphoma Maternal Aunt        non hodgkins     Social History   Tobacco Use  . Smoking status: Never Smoker  . Smokeless tobacco: Never Used  Substance Use Topics  . Alcohol use: No    Alcohol/week: 0.0 standard drinks  . Drug use: No    Allergies as of 12/17/2019 - Review Complete 12/17/2019  Allergen Reaction Noted  . Fentanyl Other (See Comments) 02/25/2014  . Sumatriptan Other (See Comments) 05/25/2007  . Doxycycline Other (See Comments) 09/03/2014  . Montelukast Other (See Comments) 09/03/2015  . Oxymorphone Other (See Comments)   . Benzoin Rash 06/15/2016  . Buspirone Other (See Comments) 09/03/2015  . Ciprofloxacin Nausea Only 11/16/2015  . Conjugated estrogens Other (See Comments) 05/25/2007  . Cymbalta [duloxetine hcl] Other (See Comments) 09/25/2012  . Duloxetine Other (See Comments) 09/03/2015  . Erythromycin ethylsuccinate Other (See Comments) 09/25/2012  . Estradiol Other (See Comments)   . Metronidazole Other (See Comments)   . Mirtazapine Other (See Comments)     Review of Systems:    All systems reviewed and negative except where noted in HPI.   Physical Exam:  BP 92/61   Pulse 71   Temp 98.1 F (36.7 C) (Oral)   Ht 5\' 7"  (1.702  m)   Wt 196 lb 6.4 oz (89.1 kg)   BMI 30.76 kg/m  No LMP recorded. Patient has had a hysterectomy. General:   Alert,  Well-developed, well-nourished, pleasant and cooperative in NAD Head:  Normocephalic and atraumatic. Eyes:  Sclera clear, no icterus.   Conjunctiva pink. Ears:  Normal auditory acuity. Neck:  Supple; no masses or thyromegaly. Lungs:  Respirations even and unlabored.  Clear throughout to auscultation.   No wheezes, crackles, or rhonchi. No acute distress. Heart:  Regular rate and rhythm; no murmurs, clicks, rubs, or gallops. Abdomen:  Normal bowel sounds.  No bruits.  Soft, non-tender and non-distended without masses, hepatosplenomegaly or hernias noted.  No guarding or rebound tenderness.  Negative Carnett sign.   Rectal:  Deferred.  Pulses:  Normal pulses noted. Extremities:  No clubbing or edema.  No cyanosis. Neurologic:  Alert and oriented x3;  grossly normal neurologically. Skin:  Intact without significant lesions or rashes.  No jaundice. Lymph Nodes:  No significant cervical adenopathy. Psych:  Alert and cooperative. Normal mood and affect.  Imaging Studies: CT MAXILLOFACIAL WO CONTRAST  Result Date: 12/05/2019 CLINICAL DATA:  Chronic pansinusitis. EXAM: CT MAXILLOFACIAL WITHOUT CONTRAST TECHNIQUE: Multidetector CT images of the paranasal sinuses were obtained using the standard protocol without intravenous contrast. COMPARISON:  Head CT 05/31/2013 report from maxillofacial CT 01/06/1998 (images unavailable) FINDINGS: Paranasal sinuses: Frontal: There is extensive partial opacification of the right frontal sinus. Moderate mucosal thickening within the inferior left frontal sinus. The frontal sinus drainage pathways are opacified bilaterally. Ethmoid: Severe bilateral ethmoid sinus mucosal thickening with extensive partial opacification of the ethmoid air cells. Maxillary: Moderate mucosal thickening within the bilateral maxillary sinuses (greater on the right).  Sphenoid: Moderate mucosal thickening within the bilateral sphenoid sinuses. The sphenoethmoidal recesses are predominantly opacified bilaterally. Right ostiomeatal unit: The ethmoid infundibulum and hiatus semilunaris are largely opacified. Left ostiomeatal  unit: The ethmoid infundibulum and hiatus semilunaris are largely opacified. Nasal passages: Mild mucosal thickening within the bilateral nasal passages. Left middle turbinate concha bullosa. The nasal septum is midline. Anatomy: No pneumatization superior to anterior ethmoid notches. Symmetric and intact olfactory grooves and fovea ethmoidalis, Keros I (1-31mm). Sellar sphenoid pneumatization pattern. The posterior left ethmoid air cell extends posteriorly above the left sphenoid sinus. IMPRESSION: Pansinusitis as detailed and most notably involving the ethmoid and right frontal sinuses. Moderate mucosal thickening is also present within the left frontal, bilateral sphenoid and bilateral maxillary sinuses. The sinus drainage pathways are largely obstructed, as described. Mild mucosal thickening within the bilateral nasal passages. Left middle turbinate concha bullosa. The posterior left ethmoid air cell extends posteriorly above the left sphenoid sinus. Electronically Signed   By: Kellie Simmering DO   On: 12/05/2019 21:04    Assessment and Plan:   [name] is a 59 y.o. y/o female who comes in today with a longstanding history of constipation.  The patient states that she was tried on Relistor without any significant improvement.  The patient is now taking Colace and Dulcolax before she goes to sleep at night with very limited results.  She also reports that she has dysphagia that was not helped by dilation of her esophagus.  The patient will be set up for a barium swallow to look for esophageal motility disorders versus reflux.  The patient will also be given samples of Linzess 290 mcg to be taken in once a day before she eats.  If this causes her  diarrhea the patient has been told to back off of the Colace and Dulcolax.  If this continues to cause diarrhea the patient has also been given samples of 145 mcg to be tried.  The patient is not due for a repeat colonoscopy and I do not believe a repeat upper endoscopy is warranted with no improvement from her last dilation.  The patient has been explained the plan agrees with it.    Lucilla Lame, MD. Marval Regal    Note: This dictation was prepared with Dragon dictation along with smaller phrase technology. Any transcriptional errors that result from this process are unintentional.

## 2019-12-18 ENCOUNTER — Other Ambulatory Visit: Payer: Self-pay | Admitting: Internal Medicine

## 2019-12-18 DIAGNOSIS — G4700 Insomnia, unspecified: Secondary | ICD-10-CM | POA: Diagnosis not present

## 2019-12-18 DIAGNOSIS — G894 Chronic pain syndrome: Secondary | ICD-10-CM | POA: Diagnosis not present

## 2019-12-18 DIAGNOSIS — M62830 Muscle spasm of back: Secondary | ICD-10-CM | POA: Diagnosis not present

## 2019-12-18 DIAGNOSIS — M47817 Spondylosis without myelopathy or radiculopathy, lumbosacral region: Secondary | ICD-10-CM | POA: Diagnosis not present

## 2019-12-21 ENCOUNTER — Other Ambulatory Visit: Payer: Self-pay | Admitting: Internal Medicine

## 2019-12-23 ENCOUNTER — Ambulatory Visit: Payer: Medicare Other

## 2020-01-07 ENCOUNTER — Other Ambulatory Visit: Payer: Self-pay | Admitting: Internal Medicine

## 2020-01-10 ENCOUNTER — Other Ambulatory Visit: Payer: Self-pay | Admitting: Internal Medicine

## 2020-01-14 DIAGNOSIS — G4730 Sleep apnea, unspecified: Secondary | ICD-10-CM | POA: Diagnosis not present

## 2020-01-14 DIAGNOSIS — J45909 Unspecified asthma, uncomplicated: Secondary | ICD-10-CM | POA: Diagnosis not present

## 2020-01-14 DIAGNOSIS — Z885 Allergy status to narcotic agent status: Secondary | ICD-10-CM | POA: Diagnosis not present

## 2020-01-14 DIAGNOSIS — E785 Hyperlipidemia, unspecified: Secondary | ICD-10-CM | POA: Diagnosis not present

## 2020-01-14 DIAGNOSIS — Z9289 Personal history of other medical treatment: Secondary | ICD-10-CM | POA: Diagnosis not present

## 2020-01-14 DIAGNOSIS — Z981 Arthrodesis status: Secondary | ICD-10-CM | POA: Diagnosis not present

## 2020-01-14 DIAGNOSIS — J310 Chronic rhinitis: Secondary | ICD-10-CM | POA: Diagnosis not present

## 2020-01-14 DIAGNOSIS — F329 Major depressive disorder, single episode, unspecified: Secondary | ICD-10-CM | POA: Diagnosis not present

## 2020-01-14 DIAGNOSIS — I10 Essential (primary) hypertension: Secondary | ICD-10-CM | POA: Diagnosis not present

## 2020-01-14 DIAGNOSIS — J329 Chronic sinusitis, unspecified: Secondary | ICD-10-CM | POA: Diagnosis not present

## 2020-01-14 DIAGNOSIS — Z6830 Body mass index (BMI) 30.0-30.9, adult: Secondary | ICD-10-CM | POA: Diagnosis not present

## 2020-01-16 DIAGNOSIS — M62830 Muscle spasm of back: Secondary | ICD-10-CM | POA: Diagnosis not present

## 2020-01-16 DIAGNOSIS — M47817 Spondylosis without myelopathy or radiculopathy, lumbosacral region: Secondary | ICD-10-CM | POA: Diagnosis not present

## 2020-01-16 DIAGNOSIS — G4700 Insomnia, unspecified: Secondary | ICD-10-CM | POA: Diagnosis not present

## 2020-01-16 DIAGNOSIS — G894 Chronic pain syndrome: Secondary | ICD-10-CM | POA: Diagnosis not present

## 2020-01-27 ENCOUNTER — Telehealth (HOSPITAL_COMMUNITY): Payer: Self-pay | Admitting: Psychiatry

## 2020-01-27 DIAGNOSIS — F3341 Major depressive disorder, recurrent, in partial remission: Secondary | ICD-10-CM

## 2020-01-27 DIAGNOSIS — F411 Generalized anxiety disorder: Secondary | ICD-10-CM

## 2020-01-27 MED ORDER — VENLAFAXINE HCL ER 75 MG PO CP24: ORAL_CAPSULE | ORAL | 0 refills | Status: DC

## 2020-01-27 NOTE — Telephone Encounter (Signed)
Please offer appointment with me at 3:30 pm on June 23 next week. I am sending her prescription for venlafaxine today.

## 2020-02-05 ENCOUNTER — Encounter (HOSPITAL_COMMUNITY): Payer: Self-pay | Admitting: Psychiatry

## 2020-02-05 ENCOUNTER — Other Ambulatory Visit: Payer: Self-pay

## 2020-02-05 ENCOUNTER — Telehealth (INDEPENDENT_AMBULATORY_CARE_PROVIDER_SITE_OTHER): Payer: Medicare Other | Admitting: Psychiatry

## 2020-02-05 DIAGNOSIS — F411 Generalized anxiety disorder: Secondary | ICD-10-CM

## 2020-02-05 DIAGNOSIS — Z634 Disappearance and death of family member: Secondary | ICD-10-CM | POA: Diagnosis not present

## 2020-02-05 DIAGNOSIS — F3341 Major depressive disorder, recurrent, in partial remission: Secondary | ICD-10-CM | POA: Diagnosis not present

## 2020-02-05 MED ORDER — ARIPIPRAZOLE 2 MG PO TABS: 2.0000 mg | ORAL_TABLET | Freq: Every day | ORAL | 0 refills | Status: DC

## 2020-02-05 MED ORDER — VENLAFAXINE HCL ER 75 MG PO CP24: ORAL_CAPSULE | ORAL | 0 refills | Status: DC

## 2020-02-05 NOTE — Progress Notes (Signed)
[facility] MD OP Progress Note  Virtual Visit via Telephone Note  I connected with [name] on 02/05/20 at  3:30 PM EDT by telephone and verified that I am speaking with the correct person using two identifiers.  Location: Patient: home Provider: Clinic   I discussed the limitations, risks, security and privacy concerns of performing an evaluation and management service by telephone and the availability of in person appointments. I also discussed with the patient that there may be a patient responsible charge related to this service. The patient expressed understanding and agreed to proceed.   I provided 19 minutes of non-face-to-face time during this encounter.     02/05/2020 3:36 PM [name]  MRN:  [id number]  Chief Complaint:  " I am doing okay I guess."  HPI: Patient recently lost her mother on June 12.  She had informed at the time of her last visit that her mother was gravely ill and hospice care.  She informed that she is holding up okay for now the first few days after her demise was very difficult for her.  She spoke at length about her mother's last moments and how the family support each other.  She has been able to sleep okay at night.  She is trying to move on from the loss.  She gets tearful when she recalls her mother's memories.  She asked if she can be given something else to help her mood.  Patient was informed that she is on the maximum dose of venlafaxine however we can add Abilify to augment it for optimal effect.  Patient was agreeable to try that.  She denied any suicidal ideations.   Visit Diagnosis:    ICD-10-CM   1. MDD (major depressive disorder), recurrent, in partial remission ([place])  F33.41   2. Anxiety state  F41.1   3. Bereavement  Z63.4     Past Psychiatric History: See above  Past Medical History:  Past Medical History:  Diagnosis Date  . Allergic rhinitis   . Anxiety   . Arthralgia   . Asthma   . Bronchitis, chronic ([place])   . Chronic  back pain   . Collagenous colitis    followed by Dr Tiffany Kocher  . Depression   . Diplopia 11/15/2013  . Dysphagia   . Endometriosis   . Fibrocystic breast disease   . GERD (gastroesophageal reflux disease)   . History of colon polyps   . Hyperlipidemia   . Hypertension   . Hypokalemia   . IBS (irritable bowel syndrome)   . Memory difficulty 11/15/2013  . Migraine headache   . Pneumonia 2017   x2  . PONV (postoperative nausea and vomiting)    used patch  . Sleep apnea    recommended CPAP  . Ulcer disease   . Urine incontinence     Past Surgical History:  Procedure Laterality Date  . ABDOMINAL HYSTERECTOMY     endometriosis  . BACK SURGERY     x6  . BREAST BIOPSY Right   . COLONOSCOPY WITH PROPOFOL N/A 06/25/2015   Procedure: COLONOSCOPY WITH PROPOFOL;  Surgeon: Manya Silvas, MD;  Location: [facility] ENDOSCOPY;  Service: Endoscopy;  Laterality: N/A;  . DILATION AND CURETTAGE OF UTERUS     and hysteroscopy  . ESOPHAGOGASTRODUODENOSCOPY N/A 06/25/2015   Procedure: ESOPHAGOGASTRODUODENOSCOPY (EGD);  Surgeon: Manya Silvas, MD;  Location: [facility] ENDOSCOPY;  Service: Endoscopy;  Laterality: N/A;  . insertion of permanent spinal cord stimulator    .  insertion of trial spinal cord stimulator    . LUMBAR DISC SURGERY    . LUMBAR FUSION  4/05 and 8/07  . LUMBAR MICRODISCECTOMY  03/28/03   L4-5 L5  . MOUTH SURGERY    . NASAL SEPTOPLASTY W/ TURBINOPLASTY    . removal of spinal cord stimulator    . SEPTOPLASTY     with reduction of turbinates  . THYROIDECTOMY N/A 06/06/2016   Procedure: THYROIDECTOMY;  Surgeon: Robert Bellow, MD;  Location: [facility] ORS;  Service: General;  Laterality: N/A;    Family Psychiatric History: Denied  Family History:  Family History  Problem Relation Age of Onset  . Allergies Mother   . Hypertension Mother   . Arthritis Mother   . Hyperlipidemia Mother   . Stroke Mother   . Hypertension Father   . Arthritis Father   . Diabetes Father   .  Hypertension Brother   . Diabetes Brother   . Arthritis Maternal Grandmother   . Hyperlipidemia Maternal Grandmother   . Hyperlipidemia Maternal Grandfather   . Arthritis Paternal Grandmother   . Arthritis Paternal Grandfather   . Asthma Other        several family member  . Leukemia Other        uncle  . Lymphoma Maternal Aunt        non hodgkins    Social History:  Social History   Socioeconomic History  . Marital status: Married    Spouse name: douglas  . Number of children: 1  . Years of education: 24 th  . Highest education level: High school graduate  Occupational History  . Occupation: Disabled    Employer: DISABLED  Tobacco Use  . Smoking status: Never Smoker  . Smokeless tobacco: Never Used  Vaping Use  . Vaping Use: Never used  Substance and Sexual Activity  . Alcohol use: No    Alcohol/week: 0.0 standard drinks  . Drug use: No  . Sexual activity: Not Currently  Other Topics Concern  . Not on file  Social History Narrative  . Not on file   Social Determinants of Health   Financial Resource Strain:   . Difficulty of Paying Living Expenses:   Food Insecurity:   . Worried About Charity fundraiser in the Last Year:   . Arboriculturist in the Last Year:   Transportation Needs:   . Film/video editor (Medical):   Marland Kitchen Lack of Transportation (Non-Medical):   Physical Activity:   . Days of Exercise per Week:   . Minutes of Exercise per Session:   Stress:   . Feeling of Stress :   Social Connections:   . Frequency of Communication with Friends and Family:   . Frequency of Social Gatherings with Friends and Family:   . Attends Religious Services:   . Active Member of Clubs or Organizations:   . Attends Archivist Meetings:   Marland Kitchen Marital Status:     Allergies:  Allergies  Allergen Reactions  . Fentanyl Other (See Comments)    Other reaction(s): Headache, Other (See Comments) headache SEVERE HEADACHE headache  . Sumatriptan Other (See  Comments)    Other reaction(s): Headache Severe headache  . Doxycycline Other (See Comments)    Other reaction(s): Headache, Other (See Comments) REACTION: causes severe abd pain REACTION: causes severe abd pain REACTION: causes severe abd pain  . Montelukast Other (See Comments)    SEVERE HEADACHE  . Oxymorphone Other (See Comments)  REACTION: severe headaches  . Benzoin Rash  . Buspirone Other (See Comments)    SEVERE HEADACHE  . Ciprofloxacin Nausea Only  . Conjugated Estrogens Other (See Comments)    headaches  . Cymbalta [Duloxetine Hcl] Other (See Comments)    Headache  . Duloxetine Other (See Comments)    Headache  . Erythromycin Ethylsuccinate Other (See Comments)    Stomach ache  . Estradiol Other (See Comments)    headache  . Metronidazole Other (See Comments)    abd pain  . Mirtazapine Other (See Comments)    headache    Metabolic Disorder Labs: No results found for: HGBA1C, MPG No results found for: PROLACTIN Lab Results  Component Value Date   CHOL 220 (H) 01/10/2018   TRIG 268.0 (H) 01/10/2018   HDL 42.20 01/10/2018   CHOLHDL 5 01/10/2018   VLDL 53.6 (H) 01/10/2018   LDLCALC 121 (H) 07/12/2016   LDLCALC 187 (H) 08/03/2015   Lab Results  Component Value Date   TSH 1.78 02/23/2018   TSH 4.75 (H) 01/10/2018    Therapeutic Level Labs: No results found for: LITHIUM No results found for: VALPROATE No components found for:  CBMZ  Current Medications: Current Outpatient Medications  Medication Sig Dispense Refill  . albuterol (PROVENTIL) (2.5 MG/3ML) 0.083% nebulizer solution Take 2.5 mg by nebulization every 4 (four) hours as needed for wheezing or shortness of breath.    Marland Kitchen albuterol (VENTOLIN HFA) 108 (90 Base) MCG/ACT inhaler Inhale 2 puffs into the lungs every 4 (four) hours as needed for wheezing or shortness of breath. 1 Inhaler 3  . amLODipine (NORVASC) 5 MG tablet TAKE 1 TABLET BY MOUTH EVERY DAY 90 tablet 1  . bisacodyl (DULCOLAX) 5 MG EC  tablet Take by mouth.    . diazepam (VALIUM) 5 MG tablet Take 5 mg by mouth 3 (three) times daily as needed.      . docusate sodium (COLACE) 100 MG capsule Take 100 mg by mouth 2 (two) times daily.    . Fluticasone-Umeclidin-Vilant (TRELEGY ELLIPTA) 100-62.5-25 MCG/INH AEPB Inhale 1 puff into the lungs daily. 1 each 11  . GRALISE 600 MG TABS     . hydrochlorothiazide (HYDRODIURIL) 25 MG tablet Take 25 mg by mouth daily.  11  . metoprolol tartrate (LOPRESSOR) 50 MG tablet TAKE 1/2 TABLET BY MOUTH TWICE A DAY (NEED OFFICE VISIT) 90 tablet 0  . montelukast (SINGULAIR) 10 MG tablet Take 1 tablet (10 mg total) by mouth at bedtime. (notify office for refill if tolerates) 14 tablet 0  . Multiple Vitamin (MULTIVITAMIN) capsule Take 1 capsule by mouth daily.      . Omega-3 Fatty Acids (FISH OIL) 1200 MG CAPS Take 1 capsule by mouth daily.    Marland Kitchen omeprazole (PRILOSEC) 40 MG capsule TAKE 1 CAPSULE (40 MG TOTAL) BY MOUTH AT BEDTIME. 90 capsule 1  . OS-CAL CALCIUM + D3 500-200 MG-UNIT TABS TAKE 2 TABLETS BY MOUTH 2 (TWO) TIMES DAILY.  6  . Oxycodone HCl 10 MG TABS Take 10 mg by mouth 3 (three) times daily as needed.  0  . potassium chloride (K-DUR) 10 MEQ tablet TAKE 1 TABLET BY MOUTH TWICE A DAY 60 tablet 0  . pravastatin (PRAVACHOL) 10 MG tablet TAKE 1 TABLET (10 MG TOTAL) BY MOUTH DAILY. 90 tablet 2  . TIROSINT 137 MCG CAPS TAKE 1 CAPSULE BY MOUTH DAILY BEFORE BREAKFAST. 30 capsule 2  . venlafaxine XR (EFFEXOR XR) 75 MG 24 hr capsule Take 3 capsules in the  morning 270 capsule 0  . XTAMPZA ER 36 MG C12A TAKE 1 CAPSULE BY MOUTH EVERY 12 HOURS  0  . zolpidem (AMBIEN) 10 MG tablet Take 10 mg by mouth at bedtime as needed.       No current facility-administered medications for this visit.     Musculoskeletal: Strength & Muscle Tone: unable to assess due to telemed visit Gait & Station: unable to assess due to telemed visit Patient leans: unable to assess due to telemed visit   Psychiatric Specialty  Exam: ROS  There were no vitals taken for this visit.There is no height or weight on file to calculate BMI.  General Appearance: unable to assess due to phone visit  Eye Contact:  unable to assess due to phone visit  Speech:  Clear and Coherent and Normal Rate  Volume:  Normal  Mood:  Depressed  Affect:  Congruent and Tearful  Thought Process:  Goal Directed, Linear and Descriptions of Associations: Intact  Orientation:  Full (Time, Place, and Person)  Thought Content: Logical   Suicidal Thoughts:  No  Homicidal Thoughts:  No  Memory:  Recent;   Good Remote;   Good  Judgement:  Fair  Insight:  Fair  Psychomotor Activity:  Normal  Concentration:  Concentration: Good and Attention Span: Good  Recall:  Good  Fund of Knowledge: Good  Language: Good  Akathisia:  Negative  Handed:  Right  AIMS (if indicated): not done  Assets:  Communication Skills Desire for Improvement Housing Social Support Transportation  ADL's:  Intact  Cognition: WNL  Sleep:  Fair   Screenings: PHQ2-9     Office Visit from 04/30/2019 in [facility] from 03/13/2019 in [facility] Office Visit from 01/14/2016 in [facility]  PHQ-2 Total Score 4 0 5  PHQ-9 Total Score 14 -- 17       Assessment and Plan: Patient is grieving the loss of her mother passed away recently.  She requested for augmentation of her depression medication as she feels she can use more help.  She was agreeable to try Abilify. Potential side effects of medication and risks vs benefits of treatment vs non-treatment were explained and discussed. All questions were answered.    1. MDD (major depressive disorder), recurrent, in partial remission (HCC)  - Continue venlafaxine XR (EFFEXOR XR) 75 MG 24 hr capsule; Take 3 capsules in the morning  Dispense: 270 capsule; Refill: 0 - Start ARIPiprazole (ABILIFY) 2 MG tablet; Take 1 tablet (2 mg total) by mouth daily.   Dispense: 90 tablet; Refill: 0  2. Anxiety state  - venlafaxine XR (EFFEXOR XR) 75 MG 24 hr capsule; Take 3 capsules in the morning  Dispense: 270 capsule; Refill: 0  3. Bereavement  Pt is continuing to take Diazepam, Oxycodone and Ambien prescribed by her pain management specialist.  Much supportive psychotherapy was provided. F/up in 6 weeks.  [place] Crane, MD 02/05/2020, 3:36 PM

## 2020-02-08 ENCOUNTER — Other Ambulatory Visit: Payer: Self-pay | Admitting: Internal Medicine

## 2020-02-10 ENCOUNTER — Other Ambulatory Visit: Payer: Self-pay | Admitting: Pulmonary Disease

## 2020-02-10 DIAGNOSIS — M47817 Spondylosis without myelopathy or radiculopathy, lumbosacral region: Secondary | ICD-10-CM | POA: Diagnosis not present

## 2020-02-10 DIAGNOSIS — G4700 Insomnia, unspecified: Secondary | ICD-10-CM | POA: Diagnosis not present

## 2020-02-10 DIAGNOSIS — G894 Chronic pain syndrome: Secondary | ICD-10-CM | POA: Diagnosis not present

## 2020-02-10 DIAGNOSIS — M62830 Muscle spasm of back: Secondary | ICD-10-CM | POA: Diagnosis not present

## 2020-02-13 NOTE — Progress Notes (Signed)
Agree with the details of the visit as noted below by Geraldo Pitter, NP.  Renold Don, MD [facility]

## 2020-03-12 DIAGNOSIS — M62830 Muscle spasm of back: Secondary | ICD-10-CM | POA: Diagnosis not present

## 2020-03-12 DIAGNOSIS — G894 Chronic pain syndrome: Secondary | ICD-10-CM | POA: Diagnosis not present

## 2020-03-12 DIAGNOSIS — M47817 Spondylosis without myelopathy or radiculopathy, lumbosacral region: Secondary | ICD-10-CM | POA: Diagnosis not present

## 2020-03-12 DIAGNOSIS — G4700 Insomnia, unspecified: Secondary | ICD-10-CM | POA: Diagnosis not present

## 2020-03-13 ENCOUNTER — Ambulatory Visit: Payer: Medicare Other

## 2020-03-18 ENCOUNTER — Telehealth (HOSPITAL_COMMUNITY): Payer: Medicare Other | Admitting: Psychiatry

## 2020-03-24 ENCOUNTER — Other Ambulatory Visit: Payer: Self-pay

## 2020-03-24 ENCOUNTER — Telehealth (INDEPENDENT_AMBULATORY_CARE_PROVIDER_SITE_OTHER): Payer: Medicare Other | Admitting: Psychiatry

## 2020-03-24 ENCOUNTER — Encounter (HOSPITAL_COMMUNITY): Payer: Self-pay | Admitting: Psychiatry

## 2020-03-24 DIAGNOSIS — F411 Generalized anxiety disorder: Secondary | ICD-10-CM | POA: Diagnosis not present

## 2020-03-24 DIAGNOSIS — Z634 Disappearance and death of family member: Secondary | ICD-10-CM

## 2020-03-24 DIAGNOSIS — F3341 Major depressive disorder, recurrent, in partial remission: Secondary | ICD-10-CM

## 2020-03-24 MED ORDER — VENLAFAXINE HCL ER 75 MG PO CP24: ORAL_CAPSULE | ORAL | 0 refills | Status: DC

## 2020-03-24 MED ORDER — ARIPIPRAZOLE 2 MG PO TABS: 2.0000 mg | ORAL_TABLET | Freq: Every day | ORAL | 0 refills | Status: DC

## 2020-03-24 NOTE — Progress Notes (Signed)
[facility] MD OP Progress Note  Virtual Visit via Video Note  I connected with [name] on 03/24/20 at  2:40 PM EDT by a video enabled telemedicine application and verified that I am speaking with the correct person using two identifiers.  Location: Patient: Home Provider: Clinic   I discussed the limitations of evaluation and management by telemedicine and the availability of in person appointments. The patient expressed understanding and agreed to proceed.  I provided 16 minutes of non-face-to-face time during this encounter.      03/24/2020 2:50 PM [name]  MRN:  [id number]  Chief Complaint:  " I am feeling better."  HPI: Patient informed that she is doing better than before.  She stated that she has cried out herself and maybe that is why she started to feel better.  She stated that she has taken Abilify regularly however she does not think it is the medicine that has helped her and it is her own efforts to get better that is helping her more.  She stated that she avoided going to her parents house for almost 2 months however she did go back recently to see her father about 2 weeks ago.  She stated that she went into her mom's room and it was very difficult for her to comprehend that she will no longer be back. She informed that Abilify has made her feel very hungry and she thinks she has gained about 5 pounds due to that. Patient stated that she will continue taking the medicine for maybe another couple of months and then will get off of it. She denies any suicidal ideations.   Visit Diagnosis:    ICD-10-CM   1. MDD (major depressive disorder), recurrent, in partial remission ([place])  F33.41   2. Anxiety state  F41.1   3. Bereavement  Z63.4     Past Psychiatric History: See above  Past Medical History:  Past Medical History:  Diagnosis Date  . Allergic rhinitis   . Anxiety   . Arthralgia   . Asthma   . Bronchitis, chronic ([place])   . Chronic back pain   .  Collagenous colitis    followed by Dr Tiffany Kocher  . Depression   . Diplopia 11/15/2013  . Dysphagia   . Endometriosis   . Fibrocystic breast disease   . GERD (gastroesophageal reflux disease)   . History of colon polyps   . Hyperlipidemia   . Hypertension   . Hypokalemia   . IBS (irritable bowel syndrome)   . Memory difficulty 11/15/2013  . Migraine headache   . Pneumonia 2017   x2  . PONV (postoperative nausea and vomiting)    used patch  . Sleep apnea    recommended CPAP  . Ulcer disease   . Urine incontinence     Past Surgical History:  Procedure Laterality Date  . ABDOMINAL HYSTERECTOMY     endometriosis  . BACK SURGERY     x6  . BREAST BIOPSY Right   . COLONOSCOPY WITH PROPOFOL N/A 06/25/2015   Procedure: COLONOSCOPY WITH PROPOFOL;  Surgeon: Manya Silvas, MD;  Location[facility] ENDOSCOPY;  Service: Endoscopy;  Laterality: N/A;  . DILATION AND CURETTAGE OF UTERUS     and hysteroscopy  . ESOPHAGOGASTRODUODENOSCOPY N/A 06/25/2015   Procedure: ESOPHAGOGASTRODUODENOSCOPY (EGD);  Surgeon: Manya Silvas, MD;  Location: [facility] ENDOSCOPY;  Service: Endoscopy;  Laterality: N/A;  . insertion of permanent spinal cord stimulator    . insertion of trial spinal cord  stimulator    . LUMBAR DISC SURGERY    . LUMBAR FUSION  4/05 and 8/07  . LUMBAR MICRODISCECTOMY  03/28/03   L4-5 L5  . MOUTH SURGERY    . NASAL SEPTOPLASTY W/ TURBINOPLASTY    . removal of spinal cord stimulator    . SEPTOPLASTY     with reduction of turbinates  . THYROIDECTOMY N/A 06/06/2016   Procedure: THYROIDECTOMY;  Surgeon: Robert Bellow, MD;  Location: [facility] ORS;  Service: General;  Laterality: N/A;    Family Psychiatric History: Denied  Family History:  Family History  Problem Relation Age of Onset  . Allergies Mother   . Hypertension Mother   . Arthritis Mother   . Hyperlipidemia Mother   . Stroke Mother   . Hypertension Father   . Arthritis Father   . Diabetes Father   . Hypertension Brother    . Diabetes Brother   . Arthritis Maternal Grandmother   . Hyperlipidemia Maternal Grandmother   . Hyperlipidemia Maternal Grandfather   . Arthritis Paternal Grandmother   . Arthritis Paternal Grandfather   . Asthma Other        several family member  . Leukemia Other        uncle  . Lymphoma Maternal Aunt        non hodgkins    Social History:  Social History   Socioeconomic History  . Marital status: Married    Spouse name: douglas  . Number of children: 1  . Years of education: 87 th  . Highest education level: High school graduate  Occupational History  . Occupation: Disabled    Employer: DISABLED  Tobacco Use  . Smoking status: Never Smoker  . Smokeless tobacco: Never Used  Vaping Use  . Vaping Use: Never used  Substance and Sexual Activity  . Alcohol use: No    Alcohol/week: 0.0 standard drinks  . Drug use: No  . Sexual activity: Not Currently  Other Topics Concern  . Not on file  Social History Narrative  . Not on file   Social Determinants of Health   Financial Resource Strain:   . Difficulty of Paying Living Expenses:   Food Insecurity:   . Worried About Charity fundraiser in the Last Year:   . Arboriculturist in the Last Year:   Transportation Needs:   . Film/video editor (Medical):   Marland Kitchen Lack of Transportation (Non-Medical):   Physical Activity:   . Days of Exercise per Week:   . Minutes of Exercise per Session:   Stress:   . Feeling of Stress :   Social Connections:   . Frequency of Communication with Friends and Family:   . Frequency of Social Gatherings with Friends and Family:   . Attends Religious Services:   . Active Member of Clubs or Organizations:   . Attends Archivist Meetings:   Marland Kitchen Marital Status:     Allergies:  Allergies  Allergen Reactions  . Fentanyl Other (See Comments)    Other reaction(s): Headache, Other (See Comments) headache SEVERE HEADACHE headache  . Sumatriptan Other (See Comments)    Other  reaction(s): Headache Severe headache  . Doxycycline Other (See Comments)    Other reaction(s): Headache, Other (See Comments) REACTION: causes severe abd pain REACTION: causes severe abd pain REACTION: causes severe abd pain  . Montelukast Other (See Comments)    SEVERE HEADACHE  . Oxymorphone Other (See Comments)    REACTION: severe headaches  .  Benzoin Rash  . Buspirone Other (See Comments)    SEVERE HEADACHE  . Ciprofloxacin Nausea Only  . Conjugated Estrogens Other (See Comments)    headaches  . Cymbalta [Duloxetine Hcl] Other (See Comments)    Headache  . Duloxetine Other (See Comments)    Headache  . Erythromycin Ethylsuccinate Other (See Comments)    Stomach ache  . Estradiol Other (See Comments)    headache  . Metronidazole Other (See Comments)    abd pain  . Mirtazapine Other (See Comments)    headache    Metabolic Disorder Labs: No results found for: HGBA1C, MPG No results found for: PROLACTIN Lab Results  Component Value Date   CHOL 220 (H) 01/10/2018   TRIG 268.0 (H) 01/10/2018   HDL 42.20 01/10/2018   CHOLHDL 5 01/10/2018   VLDL 53.6 (H) 01/10/2018   LDLCALC 121 (H) 07/12/2016   LDLCALC 187 (H) 08/03/2015   Lab Results  Component Value Date   TSH 1.78 02/23/2018   TSH 4.75 (H) 01/10/2018    Therapeutic Level Labs: No results found for: LITHIUM No results found for: VALPROATE No components found for:  CBMZ  Current Medications: Current Outpatient Medications  Medication Sig Dispense Refill  . albuterol (PROVENTIL) (2.5 MG/3ML) 0.083% nebulizer solution Take 2.5 mg by nebulization every 4 (four) hours as needed for wheezing or shortness of breath.    Marland Kitchen albuterol (VENTOLIN HFA) 108 (90 Base) MCG/ACT inhaler Inhale 2 puffs into the lungs every 4 (four) hours as needed for wheezing or shortness of breath. 1 Inhaler 3  . amLODipine (NORVASC) 5 MG tablet TAKE 1 TABLET BY MOUTH EVERY DAY 90 tablet 1  . ARIPiprazole (ABILIFY) 2 MG tablet Take 1 tablet  (2 mg total) by mouth daily. 90 tablet 0  . bisacodyl (DULCOLAX) 5 MG EC tablet Take by mouth.    . diazepam (VALIUM) 5 MG tablet Take 5 mg by mouth 3 (three) times daily as needed.      . docusate sodium (COLACE) 100 MG capsule Take 100 mg by mouth 2 (two) times daily.    . Fluticasone-Umeclidin-Vilant (TRELEGY ELLIPTA) 100-62.5-25 MCG/INH AEPB Inhale 1 puff into the lungs daily. 1 each 11  . GRALISE 600 MG TABS     . hydrochlorothiazide (HYDRODIURIL) 25 MG tablet Take 25 mg by mouth daily.  11  . metoprolol tartrate (LOPRESSOR) 50 MG tablet TAKE 1/2 TABLET BY MOUTH TWICE A DAY (NEED OFFICE VISIT) 90 tablet 0  . montelukast (SINGULAIR) 10 MG tablet Take 1 tablet (10 mg total) by mouth at bedtime. (notify office for refill if tolerates) 14 tablet 0  . Multiple Vitamin (MULTIVITAMIN) capsule Take 1 capsule by mouth daily.      . Omega-3 Fatty Acids (FISH OIL) 1200 MG CAPS Take 1 capsule by mouth daily.    Marland Kitchen omeprazole (PRILOSEC) 40 MG capsule TAKE 1 CAPSULE (40 MG TOTAL) BY MOUTH AT BEDTIME. 90 capsule 1  . OS-CAL CALCIUM + D3 500-200 MG-UNIT TABS TAKE 2 TABLETS BY MOUTH 2 (TWO) TIMES DAILY.  6  . Oxycodone HCl 10 MG TABS Take 10 mg by mouth 3 (three) times daily as needed.  0  . potassium chloride (K-DUR) 10 MEQ tablet TAKE 1 TABLET BY MOUTH TWICE A DAY 60 tablet 0  . pravastatin (PRAVACHOL) 10 MG tablet TAKE 1 TABLET (10 MG TOTAL) BY MOUTH DAILY. 90 tablet 2  . TIROSINT 137 MCG CAPS TAKE 1 CAPSULE BY MOUTH DAILY BEFORE BREAKFAST. 30 capsule 2  .  venlafaxine XR (EFFEXOR XR) 75 MG 24 hr capsule Take 3 capsules in the morning 270 capsule 0  . XTAMPZA ER 36 MG C12A TAKE 1 CAPSULE BY MOUTH EVERY 12 HOURS  0  . zolpidem (AMBIEN) 10 MG tablet Take 10 mg by mouth at bedtime as needed.       No current facility-administered medications for this visit.     Musculoskeletal: Strength & Muscle Tone: unable to assess due to telemed visit Gait & Station: unable to assess due to telemed visit Patient  leans: unable to assess due to telemed visit   Psychiatric Specialty Exam: ROS  There were no vitals taken for this visit.There is no height or weight on file to calculate BMI.  General Appearance: Fairly groomed  Eye Contact: Good  Speech:  Clear and Coherent and Normal Rate  Volume:  Normal  Mood:  Depressed  Affect:  Congruent and Tearful  Thought Process:  Goal Directed, Linear and Descriptions of Associations: Intact  Orientation:  Full (Time, Place, and Person)  Thought Content: Logical   Suicidal Thoughts:  No  Homicidal Thoughts:  No  Memory:  Recent;   Good Remote;   Good  Judgement:  Fair  Insight:  Fair  Psychomotor Activity:  Normal  Concentration:  Concentration: Good and Attention Span: Good  Recall:  Good  Fund of Knowledge: Good  Language: Good  Akathisia:  Negative  Handed:  Right  AIMS (if indicated): not done  Assets:  Communication Skills Desire for Improvement Housing Social Support Transportation  ADL's:  Intact  Cognition: WNL  Sleep:  Fair   Screenings: PHQ2-9     Office Visit from 04/30/2019 in [facility] from 03/13/2019 in [place] Office Visit from 01/14/2016 in [facility]  PHQ-2 Total Score 4 0 5  PHQ-9 Total Score 14 -- 17       Assessment and Plan: Patient appears to be doing better compared to last visit.  She lost her mother in June 2021.  And is still in bereavement but seems to be doing better than before.  We will continue same regimen for now.   1. MDD (major depressive disorder), recurrent, in partial remission (HCC)  - Continue venlafaxine XR (EFFEXOR XR) 75 MG 24 hr capsule; Take 3 capsules in the morning  Dispense: 270 capsule; Refill: 0 -Continue ARIPiprazole (ABILIFY) 2 MG tablet; Take 1 tablet (2 mg total) by mouth daily.  Dispense: 90 tablet; Refill: 0  2. Anxiety state  - venlafaxine XR (EFFEXOR XR) 75 MG 24 hr capsule; Take 3 capsules in  the morning  Dispense: 270 capsule; Refill: 0  3. Bereavement  Pt is continuing to take Diazepam, Oxycodone and Ambien prescribed by her pain management specialist.  Much supportive psychotherapy was provided. F/up in 2 months.  [place] Crane, MD 03/24/2020, 2:50 PM

## 2020-04-09 DIAGNOSIS — Z20822 Contact with and (suspected) exposure to covid-19: Secondary | ICD-10-CM | POA: Diagnosis not present

## 2020-04-13 ENCOUNTER — Telehealth: Payer: Self-pay | Admitting: Internal Medicine

## 2020-04-13 NOTE — Telephone Encounter (Signed)
Copied from [place] [id number]. Topic: Medicare AWV >> Apr 13, 2020  4:12 PM Cher Nakai R wrote: Reason for CRM:  Left message for patient to call back and schedule Medicare Annual Wellness Visit (AWV) either virtually or audio only.  Last AWV 03/13/2019  lease schedule at anytime with [name] O'Brien-Blaney at [facility]

## 2020-04-16 ENCOUNTER — Other Ambulatory Visit: Payer: Self-pay | Admitting: Internal Medicine

## 2020-04-16 DIAGNOSIS — M47817 Spondylosis without myelopathy or radiculopathy, lumbosacral region: Secondary | ICD-10-CM | POA: Diagnosis not present

## 2020-04-16 DIAGNOSIS — M62830 Muscle spasm of back: Secondary | ICD-10-CM | POA: Diagnosis not present

## 2020-04-16 DIAGNOSIS — G894 Chronic pain syndrome: Secondary | ICD-10-CM | POA: Diagnosis not present

## 2020-04-16 DIAGNOSIS — G4700 Insomnia, unspecified: Secondary | ICD-10-CM | POA: Diagnosis not present

## 2020-05-05 ENCOUNTER — Ambulatory Visit: Payer: Self-pay

## 2020-05-05 ENCOUNTER — Ambulatory Visit (INDEPENDENT_AMBULATORY_CARE_PROVIDER_SITE_OTHER): Payer: Medicare Other

## 2020-05-05 ENCOUNTER — Telehealth: Payer: Self-pay | Admitting: Internal Medicine

## 2020-05-05 VITALS — Ht 67.0 in | Wt 196.0 lb

## 2020-05-05 DIAGNOSIS — R739 Hyperglycemia, unspecified: Secondary | ICD-10-CM

## 2020-05-05 DIAGNOSIS — Z1231 Encounter for screening mammogram for malignant neoplasm of breast: Secondary | ICD-10-CM | POA: Diagnosis not present

## 2020-05-05 DIAGNOSIS — I10 Essential (primary) hypertension: Secondary | ICD-10-CM

## 2020-05-05 DIAGNOSIS — Z0000 Encounter for general adult medical examination without abnormal findings: Secondary | ICD-10-CM | POA: Diagnosis not present

## 2020-05-05 DIAGNOSIS — Z1159 Encounter for screening for other viral diseases: Secondary | ICD-10-CM | POA: Diagnosis not present

## 2020-05-05 DIAGNOSIS — E7800 Pure hypercholesterolemia, unspecified: Secondary | ICD-10-CM | POA: Diagnosis not present

## 2020-05-05 NOTE — Patient Instructions (Addendum)
[name] , Thank you for taking time to come for your Medicare Wellness Visit. I appreciate your ongoing commitment to your health goals. Please review the following plan we discussed and let me know if I can assist you in the future.   These are the goals we discussed: Goals      Patient Stated   .  DIET - Healthy diet (pt-stated)      Try to eat healthier foods like vegetables and fruits       This is a list of the screening recommended for you and due dates:  Health Maintenance  Topic Date Due  .  Hepatitis C: One time screening is recommended by Center for Disease Control  (CDC) for  adults born from [birth year] through 1967.   Never done  . Pap Smear  11/26/2019  . Mammogram  01/24/2020  . COVID-19 Vaccine (1) 05/21/2020*  . Flu Shot  11/12/2020*  . Tetanus Vaccine  05/05/2021*  . Colon Cancer Screening  06/24/2020  . HIV Screening  Completed  *Topic was postponed. The date shown is not the original due date.    Immunizations Immunization History  Administered Date(s) Administered  . Influenza Inj Mdck Quad Pf 07/14/2017  . Influenza Whole 07/14/2006, 05/27/2010  . Influenza,inj,Quad PF,6+ Mos 05/28/2013, 06/07/2016, 08/13/2019  . Influenza-Unspecified 08/22/2012, 05/28/2013  . Pneumococcal Polysaccharide-23 06/07/2016   Keep all routine maintenance appointments.   Fasting labs 05/28/20 @ 10:00  Cpe 06/05/20 @ 3:30  Advanced directives: information mailed per request.   Follow up in one year for your annual wellness visit.   Preventive Care 40-64 Years, Female Preventive care refers to lifestyle choices and visits with your health care provider that can promote health and wellness. What does preventive care include?  A yearly physical exam. This is also called an annual well check.  Dental exams once or twice a year.  Routine eye exams. Ask your health care provider how often you should have your eyes checked.  Personal lifestyle choices, including:  Daily  care of your teeth and gums.  Regular physical activity.  Eating a healthy diet.  Avoiding tobacco and drug use.  Limiting alcohol use.  Practicing safe sex.  Taking low-dose aspirin daily starting at age 55.  Taking vitamin and mineral supplements as recommended by your health care provider. What happens during an annual well check? The services and screenings done by your health care provider during your annual well check will depend on your age, overall health, lifestyle risk factors, and family history of disease. Counseling  Your health care provider may ask you questions about your:  Alcohol use.  Tobacco use.  Drug use.  Emotional well-being.  Home and relationship well-being.  Sexual activity.  Eating habits.  Work and work Statistician.  Method of birth control.  Menstrual cycle.  Pregnancy history. Screening  You may have the following tests or measurements:  Height, weight, and BMI.  Blood pressure.  Lipid and cholesterol levels. These may be checked every 5 years, or more frequently if you are over 42 years old.  Skin check.  Lung cancer screening. You may have this screening every year starting at age 107 if you have a 30-pack-year history of smoking and currently smoke or have quit within the past 15 years.  Fecal occult blood test (FOBT) of the stool. You may have this test every year starting at age 40.  Flexible sigmoidoscopy or colonoscopy. You may have a sigmoidoscopy every 5 years or a  colonoscopy every 10 years starting at age 30.  Hepatitis C blood test.  Hepatitis B blood test.  Sexually transmitted disease (STD) testing.  Diabetes screening. This is done by checking your blood sugar (glucose) after you have not eaten for a while (fasting). You may have this done every 1-3 years.  Mammogram. This may be done every 1-2 years. Talk to your health care provider about when you should start having regular mammograms. This may depend on  whether you have a family history of breast cancer.  BRCA-related cancer screening. This may be done if you have a family history of breast, ovarian, tubal, or peritoneal cancers.  Pelvic exam and Pap test. This may be done every 3 years starting at age 105. Starting at age 48, this may be done every 5 years if you have a Pap test in combination with an HPV test.  Bone density scan. This is done to screen for osteoporosis. You may have this scan if you are at high risk for osteoporosis. Discuss your test results, treatment options, and if necessary, the need for more tests with your health care provider. Vaccines  Your health care provider may recommend certain vaccines, such as:  Influenza vaccine. This is recommended every year.  Tetanus, diphtheria, and acellular pertussis (Tdap, Td) vaccine. You may need a Td booster every 10 years.  Zoster vaccine. You may need this after age 35.  Pneumococcal 13-valent conjugate (PCV13) vaccine. You may need this if you have certain conditions and were not previously vaccinated.  Pneumococcal polysaccharide (PPSV23) vaccine. You may need one or two doses if you smoke cigarettes or if you have certain conditions. Talk to your health care provider about which screenings and vaccines you need and how often you need them. This information is not intended to replace advice given to you by your health care provider. Make sure you discuss any questions you have with your health care provider. Document Released: 08/28/2015 Document Revised: 04/20/2016 Document Reviewed: 06/02/2015 Elsevier Interactive Patient Education  2017 [place] Prevention in the Home Falls can cause injuries. They can happen to people of all ages. There are many things you can do to make your home safe and to help prevent falls. What can I do on the outside of my home?  Regularly fix the edges of walkways and driveways and fix any cracks.  Remove anything that might  make you trip as you walk through a door, such as a raised step or threshold.  Trim any bushes or trees on the path to your home.  Use bright outdoor lighting.  Clear any walking paths of anything that might make someone trip, such as rocks or tools.  Regularly check to see if handrails are loose or broken. Make sure that both sides of any steps have handrails.  Any raised decks and porches should have guardrails on the edges.  Have any leaves, snow, or ice cleared regularly.  Use sand or salt on walking paths during winter.  Clean up any spills in your garage right away. This includes oil or grease spills. What can I do in the bathroom?  Use night lights.  Install grab bars by the toilet and in the tub and shower. Do not use towel bars as grab bars.  Use non-skid mats or decals in the tub or shower.  If you need to sit down in the shower, use a plastic, non-slip stool.  Keep the floor dry. Clean up any water  that spills on the floor as soon as it happens.  Remove soap buildup in the tub or shower regularly.  Attach bath mats securely with double-sided non-slip rug tape.  Do not have throw rugs and other things on the floor that can make you trip. What can I do in the bedroom?  Use night lights.  Make sure that you have a light by your bed that is easy to reach.  Do not use any sheets or blankets that are too big for your bed. They should not hang down onto the floor.  Have a firm chair that has side arms. You can use this for support while you get dressed.  Do not have throw rugs and other things on the floor that can make you trip. What can I do in the kitchen?  Clean up any spills right away.  Avoid walking on wet floors.  Keep items that you use a lot in easy-to-reach places.  If you need to reach something above you, use a strong step stool that has a grab bar.  Keep electrical cords out of the way.  Do not use floor polish or wax that makes floors  slippery. If you must use wax, use non-skid floor wax.  Do not have throw rugs and other things on the floor that can make you trip. What can I do with my stairs?  Do not leave any items on the stairs.  Make sure that there are handrails on both sides of the stairs and use them. Fix handrails that are broken or loose. Make sure that handrails are as long as the stairways.  Check any carpeting to make sure that it is firmly attached to the stairs. Fix any carpet that is loose or worn.  Avoid having throw rugs at the top or bottom of the stairs. If you do have throw rugs, attach them to the floor with carpet tape.  Make sure that you have a light switch at the top of the stairs and the bottom of the stairs. If you do not have them, ask someone to add them for you. What else can I do to help prevent falls?  Wear shoes that:  Do not have high heels.  Have rubber bottoms.  Are comfortable and fit you well.  Are closed at the toe. Do not wear sandals.  If you use a stepladder:  Make sure that it is fully opened. Do not climb a closed stepladder.  Make sure that both sides of the stepladder are locked into place.  Ask someone to hold it for you, if possible.  Clearly mark and make sure that you can see:  Any grab bars or handrails.  First and last steps.  Where the edge of each step is.  Use tools that help you move around (mobility aids) if they are needed. These include:  Canes.  Walkers.  Scooters.  Crutches.  Turn on the lights when you go into a dark area. Replace any light bulbs as soon as they burn out.  Set up your furniture so you have a clear path. Avoid moving your furniture around.  If any of your floors are uneven, fix them.  If there are any pets around you, be aware of where they are.  Review your medicines with your doctor. Some medicines can make you feel dizzy. This can increase your chance of falling. Ask your doctor what other things that you  can do to help prevent falls. This information is  not intended to replace advice given to you by your health care provider. Make sure you discuss any questions you have with your health care provider. Document Released: 05/28/2009 Document Revised: 01/07/2016 Document Reviewed: 09/05/2014 Elsevier Interactive Patient Education  2017 [place] A mammogram is a low energy X-ray of the breasts that is done to check for abnormal changes. This procedure can screen for and detect any changes that may indicate breast cancer. Mammograms are regularly done on women. A man may have a mammogram if he has a lump or swelling in his breast. A mammogram can also identify other changes and variations in the breast, such as:  Inflammation of the breast tissue (mastitis).  An infected area that contains a collection of pus (abscess).  A fluid-filled sac (cyst).  Fibrocystic changes. This is when breast tissue becomes denser, which can make the tissue feel rope-like or uneven under the skin.  Tumors that are not cancerous (benign). Tell a health care provider:  About any allergies you have.  If you have breast implants.  If you have had previous breast disease, biopsy, or surgery.  If you are breastfeeding.  If you are younger than age 6.  If you have a family history of breast cancer.  Whether you are pregnant or may be pregnant. What are the risks? Generally, this is a safe procedure. However, problems may occur, including:  Exposure to radiation. Radiation levels are very low with this test.  The results being misinterpreted.  The need for further tests.  The inability of the mammogram to detect certain cancers. What happens before the procedure?  Schedule your test about 1-2 weeks after your menstrual period if you are still menstruating. This is usually when your breasts are the least tender.  If you have had a mammogram done at a different facility in the past, get  the mammogram X-rays or have them sent to your current exam facility. The new and old images will be compared.  Wash your breasts and underarms on the day of the test.  Do not wear deodorants, perfumes, lotions, or powders anywhere on your body on the day of the test.  Remove any jewelry from your neck.  Wear clothes that you can change into and out of easily. What happens during the procedure?   You will undress from the waist up and put on a gown that opens in the front.  You will stand in front of the X-ray machine.  Each breast will be placed between two plastic or glass plates. The plates will compress your breast for a few seconds. Try to stay as relaxed as possible during the procedure. This does not cause any harm to your breasts and any discomfort you feel will be very brief.  X-rays will be taken from different angles of each breast. The procedure may vary among health care providers and hospitals. What happens after the procedure?  The mammogram will be examined by a specialist (radiologist).  You may need to repeat certain parts of the test, depending on the quality of the images. This is commonly done if the radiologist needs a better view of the breast tissue.  You may resume your normal activities.  It is up to you to get the results of your procedure. Ask your health care provider, or the department that is doing the procedure, when your results will be ready. Summary  A mammogram is a low energy X-ray of the breasts that is done to check  for abnormal changes. A man may have a mammogram if he has a lump or swelling in his breast.  If you have had a mammogram done at a different facility in the past, get the mammogram X-rays or have them sent to your current exam facility in order to compare them.  Schedule your test about 1-2 weeks after your menstrual period if you are still menstruating.  For this test, each breast will be placed between two plastic or glass  plates. The plates will compress your breast for a few seconds.  Ask when your test results will be ready. Make sure you get your test results. This information is not intended to replace advice given to you by your health care provider. Make sure you discuss any questions you have with your health care provider. Document Revised: 03/22/2018 Document Reviewed: 03/22/2018 Elsevier Patient Education  [place] A mammogram is a low energy X-ray of the breasts that is done to check for abnormal changes. This procedure can screen for and detect any changes that may indicate breast cancer. Mammograms are regularly done on women. A man may have a mammogram if he has a lump or swelling in his breast. A mammogram can also identify other changes and variations in the breast, such as:  Inflammation of the breast tissue (mastitis).  An infected area that contains a collection of pus (abscess).  A fluid-filled sac (cyst).  Fibrocystic changes. This is when breast tissue becomes denser, which can make the tissue feel rope-like or uneven under the skin.  Tumors that are not cancerous (benign). Tell a health care provider:  About any allergies you have.  If you have breast implants.  If you have had previous breast disease, biopsy, or surgery.  If you are breastfeeding.  If you are younger than age 63.  If you have a family history of breast cancer.  Whether you are pregnant or may be pregnant. What are the risks? Generally, this is a safe procedure. However, problems may occur, including:  Exposure to radiation. Radiation levels are very low with this test.  The results being misinterpreted.  The need for further tests.  The inability of the mammogram to detect certain cancers. What happens before the procedure?  Schedule your test about 1-2 weeks after your menstrual period if you are still menstruating. This is usually when your breasts are the least tender.   If you have had a mammogram done at a different facility in the past, get the mammogram X-rays or have them sent to your current exam facility. The new and old images will be compared.  Wash your breasts and underarms on the day of the test.  Do not wear deodorants, perfumes, lotions, or powders anywhere on your body on the day of the test.  Remove any jewelry from your neck.  Wear clothes that you can change into and out of easily. What happens during the procedure?   You will undress from the waist up and put on a gown that opens in the front.  You will stand in front of the X-ray machine.  Each breast will be placed between two plastic or glass plates. The plates will compress your breast for a few seconds. Try to stay as relaxed as possible during the procedure. This does not cause any harm to your breasts and any discomfort you feel will be very brief.  X-rays will be taken from different angles of each breast. The procedure may vary  among health care providers and hospitals. What happens after the procedure?  The mammogram will be examined by a specialist (radiologist).  You may need to repeat certain parts of the test, depending on the quality of the images. This is commonly done if the radiologist needs a better view of the breast tissue.  You may resume your normal activities.  It is up to you to get the results of your procedure. Ask your health care provider, or the department that is doing the procedure, when your results will be ready. Summary  A mammogram is a low energy X-ray of the breasts that is done to check for abnormal changes. A man may have a mammogram if he has a lump or swelling in his breast.  If you have had a mammogram done at a different facility in the past, get the mammogram X-rays or have them sent to your current exam facility in order to compare them.  Schedule your test about 1-2 weeks after your menstrual period if you are still menstruating.   For this test, each breast will be placed between two plastic or glass plates. The plates will compress your breast for a few seconds.  Ask when your test results will be ready. Make sure you get your test results. This information is not intended to replace advice given to you by your health care provider. Make sure you discuss any questions you have with your health care provider. Document Revised: 03/22/2018 Document Reviewed: 03/22/2018 Elsevier Patient Education  [place].

## 2020-05-05 NOTE — Progress Notes (Addendum)
Subjective:   [name] is a 59 y.o. female who presents for Medicare Annual (Subsequent) preventive examination.  Review of Systems    No ROS.  Medicare Wellness Virtual Visit.          Objective:    Today's Vitals   05/05/20 1402  Weight: 196 lb (88.9 kg)  Height: 5\' 7"  (1.702 m)   Body mass index is 30.7 kg/m.  Advanced Directives 05/05/2020 03/13/2019 07/23/2018 11/08/2016 06/06/2016 06/06/2016 05/30/2016  Does Patient Have a Medical Advance Directive? No No No No No No No  Would patient like information on creating a medical advance directive? No - Patient declined No - Patient declined - No - Patient declined No - patient declined information No - patient declined information No - patient declined information  Some encounter information is confidential and restricted. Go to Review Flowsheets activity to see all data.    Current Medications (verified) Outpatient Encounter Medications as of 05/05/2020  Medication Sig  . albuterol (PROVENTIL) (2.5 MG/3ML) 0.083% nebulizer solution Take 2.5 mg by nebulization every 4 (four) hours as needed for wheezing or shortness of breath.  Marland Kitchen albuterol (VENTOLIN HFA) 108 (90 Base) MCG/ACT inhaler Inhale 2 puffs into the lungs every 4 (four) hours as needed for wheezing or shortness of breath.  Marland Kitchen amLODipine (NORVASC) 5 MG tablet TAKE 1 TABLET BY MOUTH EVERY DAY  . ARIPiprazole (ABILIFY) 2 MG tablet Take 1 tablet (2 mg total) by mouth daily.  . bisacodyl (DULCOLAX) 5 MG EC tablet Take by mouth.  . diazepam (VALIUM) 5 MG tablet Take 5 mg by mouth 3 (three) times daily as needed.    . docusate sodium (COLACE) 100 MG capsule Take 100 mg by mouth 2 (two) times daily.  . Fluticasone-Umeclidin-Vilant (TRELEGY ELLIPTA) 100-62.5-25 MCG/INH AEPB Inhale 1 puff into the lungs daily.  Marland Kitchen GRALISE 600 MG TABS   . hydrochlorothiazide (HYDRODIURIL) 25 MG tablet Take 25 mg by mouth daily.  . metoprolol tartrate (LOPRESSOR) 50 MG tablet TAKE 1/2 TABLET BY  MOUTH TWICE A DAY (NEED OFFICE VISIT)  . montelukast (SINGULAIR) 10 MG tablet Take 1 tablet (10 mg total) by mouth at bedtime. (notify office for refill if tolerates)  . Multiple Vitamin (MULTIVITAMIN) capsule Take 1 capsule by mouth daily.    . Omega-3 Fatty Acids (FISH OIL) 1200 MG CAPS Take 1 capsule by mouth daily.  Marland Kitchen omeprazole (PRILOSEC) 40 MG capsule TAKE 1 CAPSULE (40 MG TOTAL) BY MOUTH AT BEDTIME.  Marland Kitchen OS-CAL CALCIUM + D3 500-200 MG-UNIT TABS TAKE 2 TABLETS BY MOUTH 2 (TWO) TIMES DAILY.  Marland Kitchen Oxycodone HCl 10 MG TABS Take 10 mg by mouth 3 (three) times daily as needed.  . potassium chloride (K-DUR) 10 MEQ tablet TAKE 1 TABLET BY MOUTH TWICE A DAY  . pravastatin (PRAVACHOL) 10 MG tablet TAKE 1 TABLET (10 MG TOTAL) BY MOUTH DAILY.  Marland Kitchen TIROSINT 137 MCG CAPS TAKE 1 CAPSULE BY MOUTH DAILY BEFORE BREAKFAST.  Marland Kitchen venlafaxine XR (EFFEXOR XR) 75 MG 24 hr capsule Take 3 capsules in the morning  . XTAMPZA ER 36 MG C12A TAKE 1 CAPSULE BY MOUTH EVERY 12 HOURS  . zolpidem (AMBIEN) 10 MG tablet Take 10 mg by mouth at bedtime as needed.    . [DISCONTINUED] triamcinolone (NASACORT AQ) 55 MCG/ACT AERO nasal inhaler Place 2 sprays into the nose 2 (two) times daily. 1 sprays each nostril twice a day   No facility-administered encounter medications on file as of 05/05/2020.  Allergies (verified) Fentanyl, Sumatriptan, Doxycycline, Montelukast, Oxymorphone, Benzoin, Buspirone, Ciprofloxacin, Conjugated estrogens, Cymbalta [duloxetine hcl], Duloxetine, Erythromycin ethylsuccinate, Estradiol, Metronidazole, and Mirtazapine   History: Past Medical History:  Diagnosis Date  . Allergic rhinitis   . Anxiety   . Arthralgia   . Asthma   . Bronchitis, chronic ([place])   . Chronic back pain   . Collagenous colitis    followed by Dr Tiffany Kocher  . Depression   . Diplopia 11/15/2013  . Dysphagia   . Endometriosis   . Fibrocystic breast disease   . GERD (gastroesophageal reflux disease)   . History of colon polyps   .  Hyperlipidemia   . Hypertension   . Hypokalemia   . IBS (irritable bowel syndrome)   . Memory difficulty 11/15/2013  . Migraine headache   . Pneumonia 2017   x2  . PONV (postoperative nausea and vomiting)    used patch  . Sleep apnea    recommended CPAP  . Ulcer disease   . Urine incontinence    Past Surgical History:  Procedure Laterality Date  . ABDOMINAL HYSTERECTOMY     endometriosis  . BACK SURGERY     x6  . BREAST BIOPSY Right   . COLONOSCOPY WITH PROPOFOL N/A 06/25/2015   Procedure: COLONOSCOPY WITH PROPOFOL;  Surgeon: Manya Silvas, MD;  Location: [facility] ENDOSCOPY;  Service: Endoscopy;  Laterality: N/A;  . DILATION AND CURETTAGE OF UTERUS     and hysteroscopy  . ESOPHAGOGASTRODUODENOSCOPY N/A 06/25/2015   Procedure: ESOPHAGOGASTRODUODENOSCOPY (EGD);  Surgeon: Manya Silvas, MD;  Location: [facility] ENDOSCOPY;  Service: Endoscopy;  Laterality: N/A;  . insertion of permanent spinal cord stimulator    . insertion of trial spinal cord stimulator    . LUMBAR DISC SURGERY    . LUMBAR FUSION  4/05 and 8/07  . LUMBAR MICRODISCECTOMY  03/28/03   L4-5 L5  . MOUTH SURGERY    . NASAL SEPTOPLASTY W/ TURBINOPLASTY    . removal of spinal cord stimulator    . SEPTOPLASTY     with reduction of turbinates  . THYROIDECTOMY N/A 06/06/2016   Procedure: THYROIDECTOMY;  Surgeon: Robert Bellow, MD;  Location: [facility] ORS;  Service: General;  Laterality: N/A;   Family History  Problem Relation Age of Onset  . Allergies Mother   . Hypertension Mother   . Arthritis Mother   . Hyperlipidemia Mother   . Stroke Mother   . Hypertension Father   . Arthritis Father   . Diabetes Father   . Hypertension Brother   . Diabetes Brother   . Arthritis Maternal Grandmother   . Hyperlipidemia Maternal Grandmother   . Hyperlipidemia Maternal Grandfather   . Arthritis Paternal Grandmother   . Arthritis Paternal Grandfather   . Asthma Other        several family member  . Leukemia Other         uncle  . Lymphoma Maternal Aunt        non hodgkins   Social History   Socioeconomic History  . Marital status: Married    Spouse name: douglas  . Number of children: 1  . Years of education: 107 th  . Highest education level: High school graduate  Occupational History  . Occupation: Disabled    Employer: DISABLED  Tobacco Use  . Smoking status: Never Smoker  . Smokeless tobacco: Never Used  Vaping Use  . Vaping Use: Never used  Substance and Sexual Activity  . Alcohol use: No  Alcohol/week: 0.0 standard drinks  . Drug use: No  . Sexual activity: Not Currently  Other Topics Concern  . Not on file  Social History Narrative  . Not on file   Social Determinants of Health   Financial Resource Strain: Low Risk   . Difficulty of Paying Living Expenses: Not hard at all  Food Insecurity: No Food Insecurity  . Worried About Charity fundraiser in the Last Year: Never true  . Ran Out of Food in the Last Year: Never true  Transportation Needs: No Transportation Needs  . Lack of Transportation (Medical): No  . Lack of Transportation (Non-Medical): No  Physical Activity:   . Days of Exercise per Week: Not on file  . Minutes of Exercise per Session: Not on file  Stress: No Stress Concern Present  . Feeling of Stress : Not at all  Social Connections: Unknown  . Frequency of Communication with Friends and Family: Not on file  . Frequency of Social Gatherings with Friends and Family: Not on file  . Attends Religious Services: Not on file  . Active Member of Clubs or Organizations: Not on file  . Attends Archivist Meetings: Not on file  . Marital Status: Married    Tobacco Counseling Counseling given: Not Answered   Clinical Intake:  Pre-visit preparation completed: Yes           How often do you need to have someone help you when you read instructions, pamphlets, or other written materials from your doctor or pharmacy?: 1 - Never   Interpreter  Needed?: No      Activities of Daily Living In your present state of health, do you have any difficulty performing the following activities: 05/05/2020  Hearing? N  Vision? N  Difficulty concentrating or making decisions? N  Walking or climbing stairs? Y  Dressing or bathing? N  Doing errands, shopping? Y  Preparing Food and eating ? Y  Comment Husband assist  Using the Toilet? N  In the past six months, have you accidently leaked urine? N  Do you have problems with loss of bowel control? N  Managing your Medications? Y  Comment Husband assist  Managing your Finances? Y  Comment Husband assist  Housekeeping or managing your Housekeeping? Y  Comment Husband assist  Some recent data might be hidden    Patient Care Team: Einar Pheasant, MD as PCP - General (Internal Medicine) Margaretha Sheffield, MD as Referring Physician (Physical Medicine and Rehabilitation) Margaretha Sheffield, MD as Referring Physician (Physical Medicine and Rehabilitation) Bary Castilla, Forest Gleason, MD (General Surgery)  Indicate any recent Medical Services you may have received from other than [facility] providers in the past year (date may be approximate).     Assessment:   This is a routine wellness examination for [facility].  I connected with Aeriel today by telephone and verified that I am speaking with the correct person using two identifiers. Location patient: home Location provider: work Persons participating in the virtual visit: patient, Marine scientist.    I discussed the limitations, risks, security and privacy concerns of performing an evaluation and management service by telephone and the availability of in person appointments. The patient expressed understanding and verbally consented to this telephonic visit.    Interactive audio and video telecommunications were attempted between this provider and patient, however failed, due to patient having technical difficulties OR patient did not have access to video capability.  We  continued and completed visit with audio only.  Some vital  signs may be absent or patient reported.   Hearing/Vision screen  Hearing Screening   125Hz  250Hz  500Hz  1000Hz  2000Hz  3000Hz  4000Hz  6000Hz  8000Hz   Right ear:           Left ear:           Comments: Patient is able to hear conversational tones without difficulty.  No issues reported.  Vision Screening Comments: Wears corrective lenses Visual acuity not assessed, virtual visit.      Dietary issues and exercise activities discussed: Current Exercise Habits: The patient does not participate in regular exercise at present  Goals      Patient Stated   .  DIET - Healthy diet (pt-stated)      Try to eat healthier foods like vegetables and fruits      Depression Screen PHQ 2/9 Scores 05/05/2020 04/30/2019 03/13/2019 01/14/2016  PHQ - 2 Score 1 4 0 5  PHQ- 9 Score - 14 - 17    Fall Risk Fall Risk  05/05/2020 03/13/2019 01/14/2016  Falls in the past year? 1 0 Yes  Number falls in past yr: 0 - 1  Injury with Fall? 0 - No  Follow up Falls evaluation completed - -   Handrails in use when climbing stairs? Yes Home free of loose throw rugs in walkways, pet beds, electrical cords, etc? Yes  Adequate lighting in your home to reduce risk of falls? Yes   ASSISTIVE DEVICES UTILIZED TO PREVENT FALLS:  Life alert? Yes   Use of a cane, walker or w/c? Yes  Grab bars in the bathroom? No  Shower chair or bench in shower? Yes  Elevated toilet seat or a handicapped toilet? No   TIMED UP AND GO: Was the test performed? No . Virtual visit.   Cognitive Function:     6CIT Screen 05/05/2020 03/13/2019  What Year? 0 points 0 points  What month? 0 points 0 points  What time? - 0 points  Count back from 20 - 0 points  Months in reverse 0 points 0 points  Repeat phrase 0 points -    Immunizations Immunization History  Administered Date(s) Administered  . Influenza Inj Mdck Quad Pf 07/14/2017  . Influenza Whole 07/14/2006, 05/27/2010  .  Influenza,inj,Quad PF,6+ Mos 05/28/2013, 06/07/2016, 08/13/2019  . Influenza-Unspecified 08/22/2012, 05/28/2013  . Pneumococcal Polysaccharide-23 06/07/2016    TDAP status: Due, Education has been provided regarding the importance of this vaccine. Advised may receive this vaccine at local pharmacy or Health Dept. Aware to provide a copy of the vaccination record if obtained from local pharmacy or Health Dept. Verbalized acceptance and understanding.  Deferred.    Health Maintenance Health Maintenance  Topic Date Due  . Hepatitis C Screening  Never done  . PAP SMEAR-Modifier  11/26/2019  . MAMMOGRAM  01/24/2020  . COVID-19 Vaccine (1) 05/21/2020 (Originally 02/26/1973)  . INFLUENZA VACCINE  11/12/2020 (Originally 03/15/2020)  . TETANUS/TDAP  05/05/2021 (Originally 02/25/1980)  . COLONOSCOPY  06/24/2020  . HIV Screening  Completed   Medication- patient notes she has not taken pravastatin in 2-3 months due to no Rx refill. Fasting lab appointment scheduled 1 week prior to requested cpe with pap smear. Taking OTC potassium once daily since labs completed in June. See care everywhere.   Hep C Screening- consent given.   Mammogram- ordered, plans to schedule. Number provided.   Colonoscopy- declined until she has further follow up with Gastro.  Influenza vaccine- deferred until next visit.   Covid vaccine- plans to receive  later in the season. Deferred.   Dental Screening: Recommended annual dental exams for proper oral hygiene. Dentures.   Community Resource Referral / Chronic Care Management: CRR required this visit?  No   CCM required this visit?  No      Plan:   Keep all routine maintenance appointments.   Fasting labs 05/28/20 @ 10:00. PCP please add/delete labs as needed.   Cpe 06/05/20 @ 3:30; refill pravastatin and potasium. See most recent labs care everywhere.    I have personally reviewed and noted the following in the patient's chart:   . Medical and social  history . Use of alcohol, tobacco or illicit drugs  . Current medications and supplements . Functional ability and status . Nutritional status . Physical activity . Advanced directives . List of other physicians . Hospitalizations, surgeries, and ER visits in previous 12 months . Vitals . Screenings to include cognitive, depression, and falls . Referrals and appointments  In addition, I have reviewed and discussed with patient certain preventive protocols, quality metrics, and best practice recommendations. A written personalized care plan for preventive services as well as general preventive health recommendations were provided to patient via mychart.      Varney Biles, LPN   08/16/5850     Reviewed above information.  Agree with need for f/u labs.  Message sent to nurse to schedule fasting lab appt earlier - to f/u on potassium, etc.  Agree with plan.    Dr Nicki Reaper

## 2020-05-06 ENCOUNTER — Telehealth: Payer: Self-pay | Admitting: Internal Medicine

## 2020-05-06 NOTE — Telephone Encounter (Signed)
[name] had wellness visit with [name].  She has ordered labs.  Needs earlier lab appt.  Would like this week if possible, and then can determine need for potassium refill? ,etc.  She did have outside labs in 01/2020 - but potassium low then, etc.  Needs labs this week if possible.  Also, please confirm what medication she is currently taking.  Is she still on hctz, potassium.  Per note - out of pravastatin.

## 2020-05-06 NOTE — Telephone Encounter (Signed)
LM for patient to call back to schedule earlier lab appointment THIS week.

## 2020-05-13 ENCOUNTER — Other Ambulatory Visit: Payer: Medicare Other

## 2020-05-19 ENCOUNTER — Telehealth (HOSPITAL_COMMUNITY): Payer: Medicare Other | Admitting: Psychiatry

## 2020-05-20 ENCOUNTER — Other Ambulatory Visit: Payer: Medicare Other

## 2020-05-21 DIAGNOSIS — M47817 Spondylosis without myelopathy or radiculopathy, lumbosacral region: Secondary | ICD-10-CM | POA: Diagnosis not present

## 2020-05-21 DIAGNOSIS — G4700 Insomnia, unspecified: Secondary | ICD-10-CM | POA: Diagnosis not present

## 2020-05-21 DIAGNOSIS — G894 Chronic pain syndrome: Secondary | ICD-10-CM | POA: Diagnosis not present

## 2020-05-21 DIAGNOSIS — M62830 Muscle spasm of back: Secondary | ICD-10-CM | POA: Diagnosis not present

## 2020-05-28 ENCOUNTER — Other Ambulatory Visit: Payer: Self-pay

## 2020-05-28 ENCOUNTER — Other Ambulatory Visit: Payer: Medicare Other

## 2020-05-28 ENCOUNTER — Other Ambulatory Visit (INDEPENDENT_AMBULATORY_CARE_PROVIDER_SITE_OTHER): Payer: Medicare Other

## 2020-05-28 DIAGNOSIS — I10 Essential (primary) hypertension: Secondary | ICD-10-CM

## 2020-05-28 DIAGNOSIS — Z23 Encounter for immunization: Secondary | ICD-10-CM

## 2020-05-28 DIAGNOSIS — Z1159 Encounter for screening for other viral diseases: Secondary | ICD-10-CM | POA: Diagnosis not present

## 2020-05-28 DIAGNOSIS — E7800 Pure hypercholesterolemia, unspecified: Secondary | ICD-10-CM | POA: Diagnosis not present

## 2020-05-28 DIAGNOSIS — R739 Hyperglycemia, unspecified: Secondary | ICD-10-CM

## 2020-05-28 LAB — HEPATIC FUNCTION PANEL
ALT: 19 U/L (ref 0–35)
AST: 20 U/L (ref 0–37)
Albumin: 3.9 g/dL (ref 3.5–5.2)
Alkaline Phosphatase: 64 U/L (ref 39–117)
Bilirubin, Direct: 0.1 mg/dL (ref 0.0–0.3)
Total Bilirubin: 0.4 mg/dL (ref 0.2–1.2)
Total Protein: 6.6 g/dL (ref 6.0–8.3)

## 2020-05-28 LAB — LIPID PANEL
Cholesterol: 267 mg/dL — ABNORMAL HIGH (ref 0–200)
HDL: 36.5 mg/dL — ABNORMAL LOW (ref >39.00)
NonHDL: 230.46
Total CHOL/HDL Ratio: 7
Triglycerides: 284 mg/dL — ABNORMAL HIGH (ref 0.0–149.0)
VLDL: 56.8 mg/dL — ABNORMAL HIGH (ref 0.0–40.0)

## 2020-05-28 LAB — BASIC METABOLIC PANEL
BUN: 15 mg/dL (ref 6–23)
CO2: 34 mEq/L — ABNORMAL HIGH (ref 19–32)
Calcium: 8.8 mg/dL (ref 8.4–10.5)
Chloride: 98 mEq/L (ref 96–112)
Creatinine, Ser: 1.1 mg/dL (ref 0.40–1.20)
GFR: 54.05 mL/min — ABNORMAL LOW (ref >60.00)
Glucose, Bld: 83 mg/dL (ref 70–99)
Potassium: 3.5 mEq/L (ref 3.5–5.1)
Sodium: 141 mEq/L (ref 135–145)

## 2020-05-28 LAB — CBC WITH DIFFERENTIAL/PLATELET
Basophils Absolute: 0 10*3/uL (ref 0.0–0.1)
Basophils Relative: 0.3 % (ref 0.0–3.0)
Eosinophils Absolute: 0.5 10*3/uL (ref 0.0–0.7)
Eosinophils Relative: 8 % — ABNORMAL HIGH (ref 0.0–5.0)
HCT: 41.3 % (ref 36.0–46.0)
Hemoglobin: 14.2 g/dL (ref 12.0–15.0)
Lymphocytes Relative: 30.4 % (ref 12.0–46.0)
Lymphs Abs: 2.1 10*3/uL (ref 0.7–4.0)
MCHC: 34.3 g/dL (ref 30.0–36.0)
MCV: 90.3 fl (ref 78.0–100.0)
Monocytes Absolute: 0.5 10*3/uL (ref 0.1–1.0)
Monocytes Relative: 7.5 % (ref 3.0–12.0)
Neutro Abs: 3.6 10*3/uL (ref 1.4–7.7)
Neutrophils Relative %: 53.8 % (ref 43.0–77.0)
Platelets: 337 10*3/uL (ref 150.0–400.0)
RBC: 4.58 Mil/uL (ref 3.87–5.11)
RDW: 13.2 % (ref 11.5–15.5)
WBC: 6.8 10*3/uL (ref 4.0–10.5)

## 2020-05-28 LAB — TSH: TSH: 2.02 u[IU]/mL (ref 0.35–4.50)

## 2020-05-28 LAB — HEMOGLOBIN A1C: Hgb A1c MFr Bld: 5.4 % (ref 4.6–6.5)

## 2020-05-28 LAB — LDL CHOLESTEROL, DIRECT: Direct LDL: 159 mg/dL

## 2020-05-29 LAB — HEPATITIS C ANTIBODY
Hepatitis C Ab: NONREACTIVE
SIGNAL TO CUT-OFF: 0.18 (ref <1.00)

## 2020-05-29 IMAGING — CT CT MAXILLOFACIAL W/O CM
3 of 5 series · 14 of 47 positions shown, 16 images · non-contrast
Comparison: Head CT 05/31/2013 report from maxillofacial CT
01/06/1998 (images unavailable)

CLINICAL DATA: Chronic pansinusitis.

EXAM:
CT MAXILLOFACIAL WITHOUT CONTRAST
TECHNIQUE: Multidetector CT images of the paranasal sinuses were obtained using
the standard protocol without intravenous contrast.

[Series 6: sinus 2.00 cor · coronal · 0.33mm/px · 3 of 73 slices shown]
[im 25/73  bone]
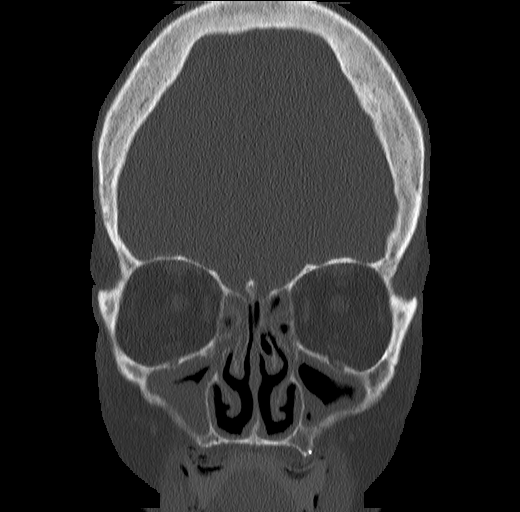
[im 33/73  bone]
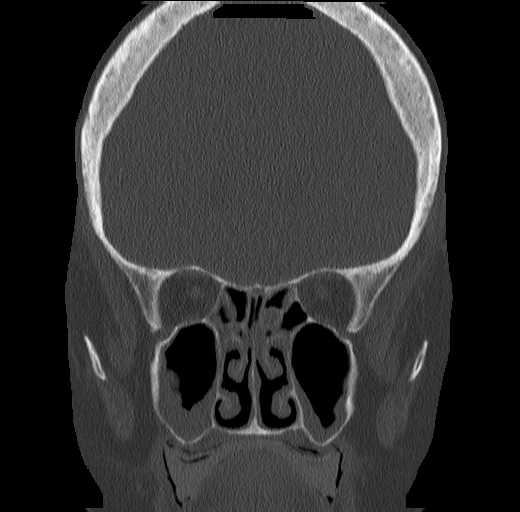
[im 41/73  bone]
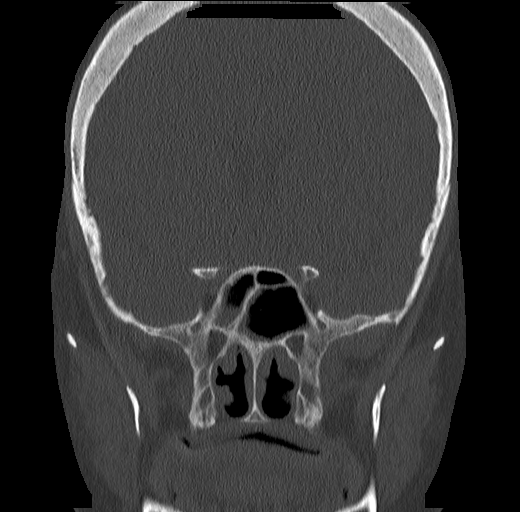

[Series 8: sinus 2.00 sag · sagittal · 0.29mm/px · 3 of 86 slices shown]
[im 29/86  bone]
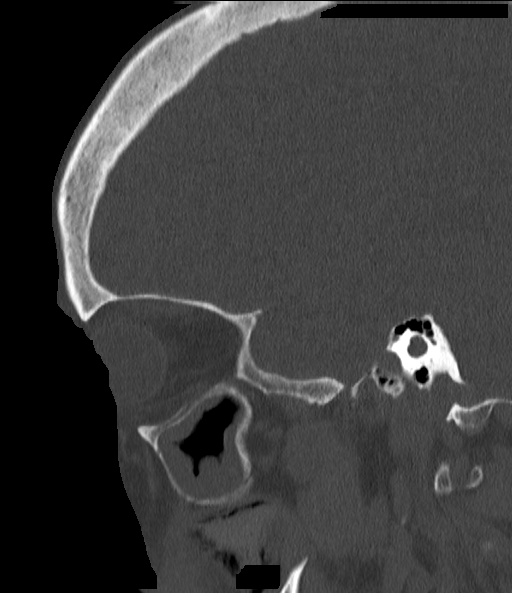
[im 43/86  bone]
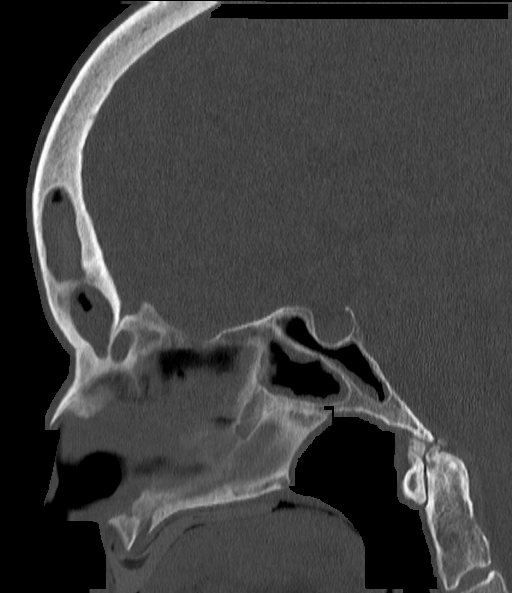
[im 57/86  bone]
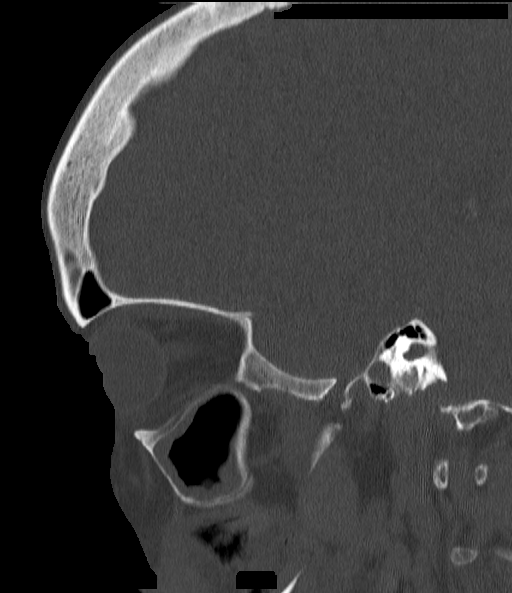

[Series 10: thins sinus 1.00 ax · axial · 0.29mm/px · z∈[-537,-393]mm · 8 of 169 slices shown, 10 images]
[im 13/169  brain]
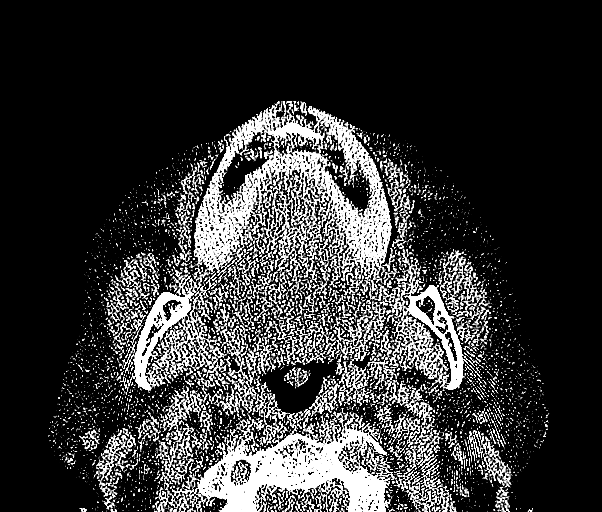
[im 13/169  bone]
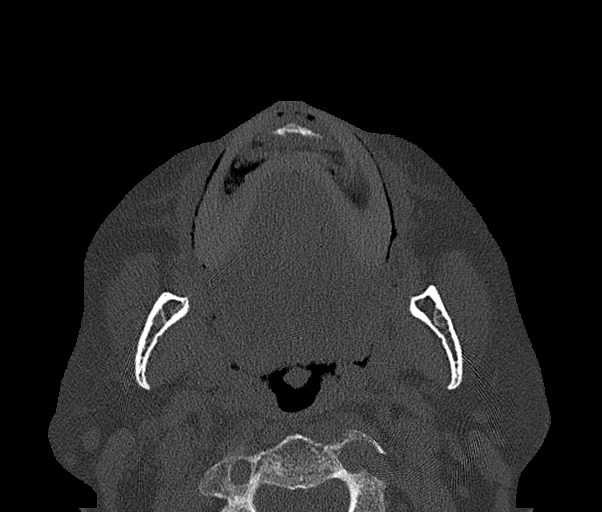
[im 37/169  bone]
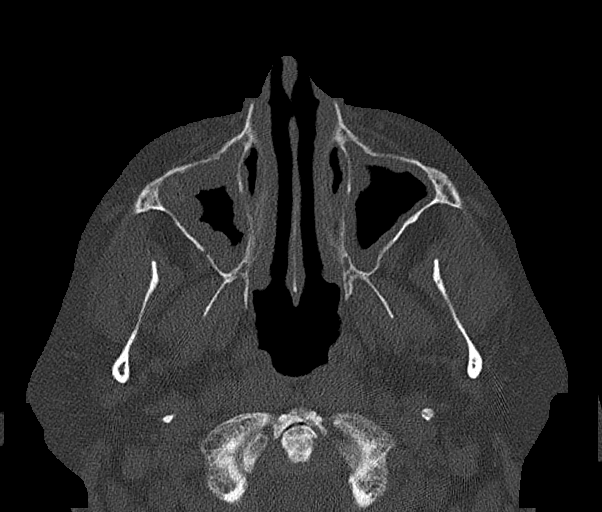
[im 61/169  bone]
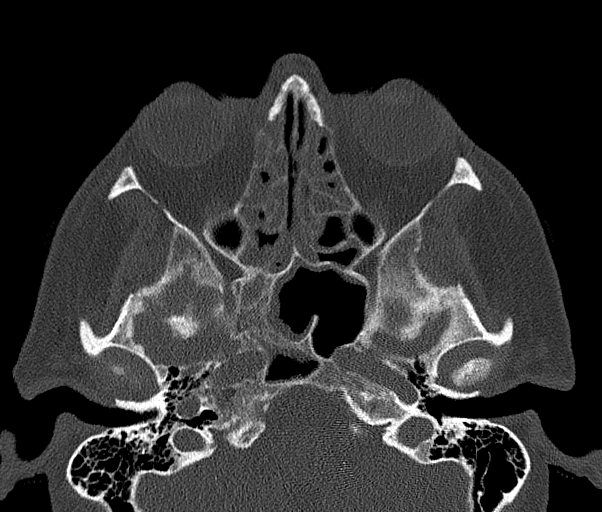
[im 73/169  bone]
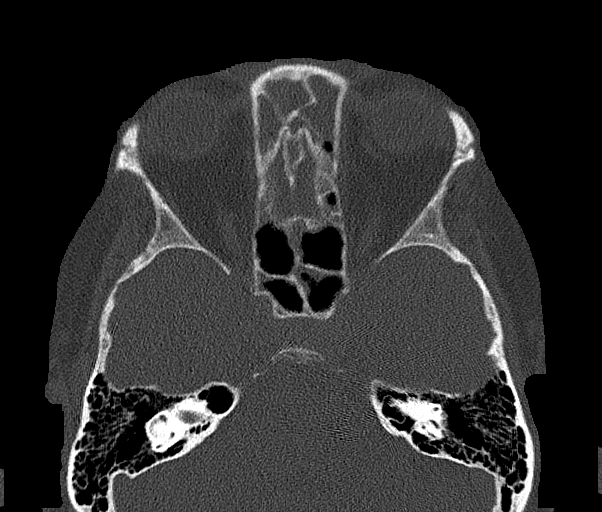
[im 97/169  brain]
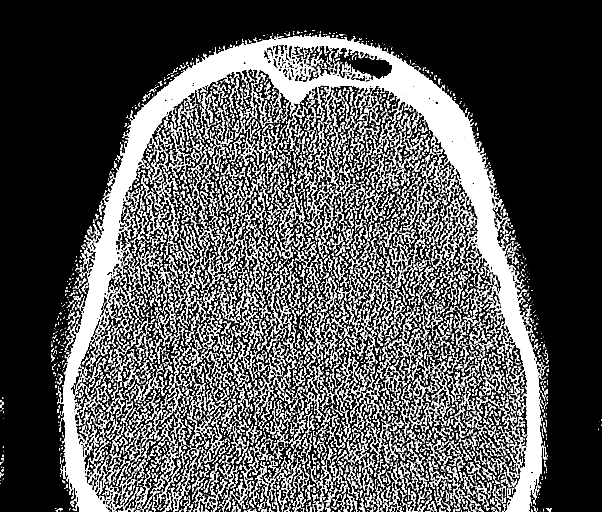
[im 97/169  bone]
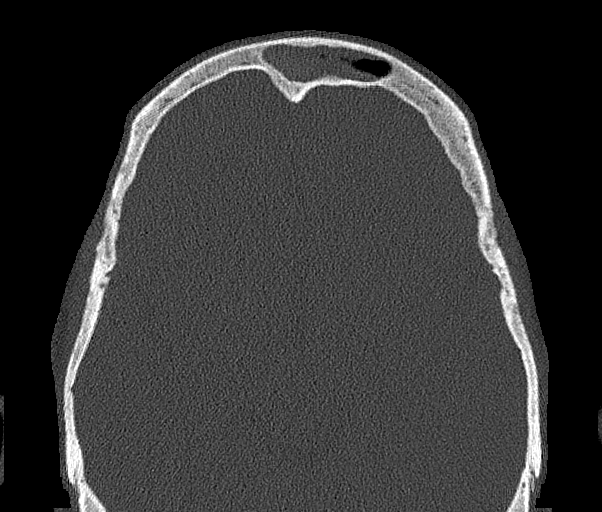
[im 109/169  bone]
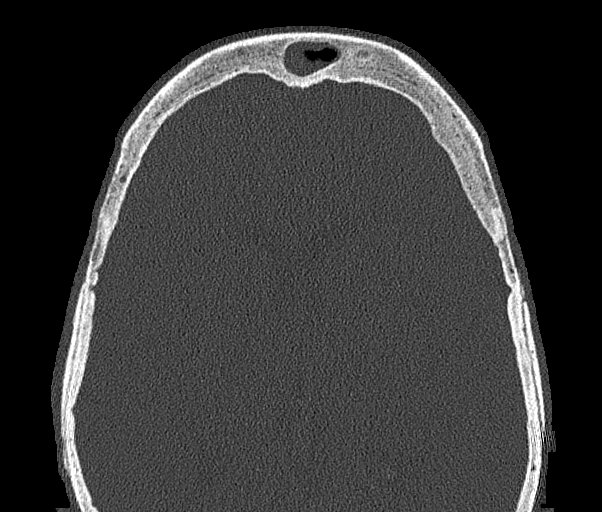
[im 133/169  bone]
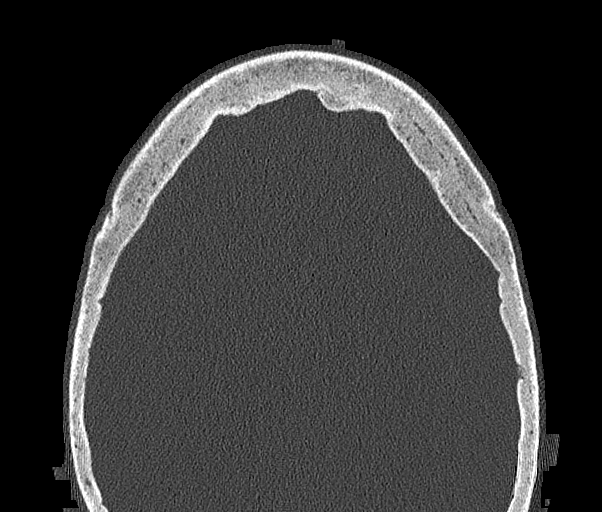
[im 157/169  bone]
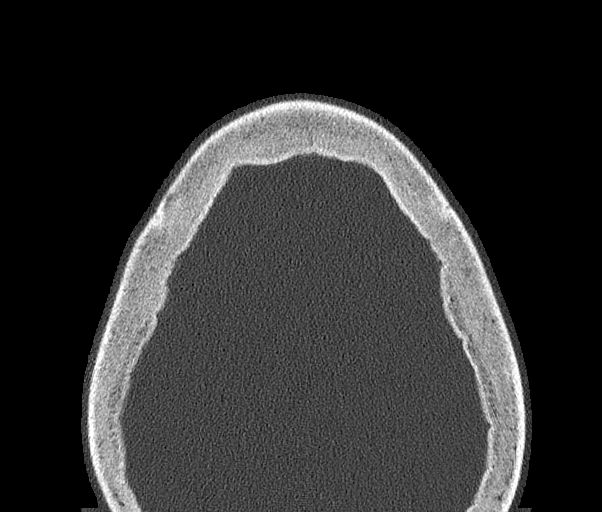

[14 of 47 positions shown; findings below may reference images not displayed]

FINDINGS: Paranasal sinuses:

Frontal: There is extensive partial opacification of the right
frontal sinus. Moderate mucosal thickening within the inferior left
frontal sinus. The frontal sinus drainage pathways are opacified
bilaterally.

Ethmoid: Severe bilateral ethmoid sinus mucosal thickening with
extensive partial opacification of the ethmoid air cells.

Maxillary: Moderate mucosal thickening within the bilateral
maxillary sinuses (greater on the right).

Sphenoid: Moderate mucosal thickening within the bilateral sphenoid
sinuses. The sphenoethmoidal recesses are predominantly opacified
bilaterally.

Right ostiomeatal unit: The ethmoid infundibulum and hiatus
semilunaris are largely opacified.

Left ostiomeatal unit: The ethmoid infundibulum and hiatus
semilunaris are largely opacified.

Nasal passages: Mild mucosal thickening within the bilateral nasal
passages. Left middle turbinate concha bullosa. The nasal septum is
midline.

Anatomy: No pneumatization superior to anterior ethmoid notches.
Symmetric and intact olfactory grooves and fovea ethmoidalis, Keros
I (1-3mm). Sellar sphenoid pneumatization pattern. The posterior
left ethmoid air cell extends posteriorly above the left sphenoid
sinus.
IMPRESSION: Pansinusitis as detailed and most notably involving the ethmoid and
right frontal sinuses. Moderate mucosal thickening is also present
within the left frontal, bilateral sphenoid and bilateral maxillary
sinuses. The sinus drainage pathways are largely obstructed, as
described.

Mild mucosal thickening within the bilateral nasal passages. Left
middle turbinate concha bullosa.

The posterior left ethmoid air cell extends posteriorly above the
left sphenoid sinus.

## 2020-06-02 ENCOUNTER — Telehealth (HOSPITAL_COMMUNITY): Payer: Medicare Other | Admitting: Psychiatry

## 2020-06-02 ENCOUNTER — Other Ambulatory Visit: Payer: Self-pay

## 2020-06-05 ENCOUNTER — Other Ambulatory Visit (HOSPITAL_COMMUNITY)
Admission: RE | Admit: 2020-06-05 | Discharge: 2020-06-05 | Disposition: A | Discharge status: Home / Self Care | Payer: Medicare Other | Source: Ambulatory Visit | Attending: Internal Medicine | Admitting: Internal Medicine

## 2020-06-05 ENCOUNTER — Other Ambulatory Visit: Payer: Self-pay

## 2020-06-05 ENCOUNTER — Encounter: Payer: Self-pay | Admitting: Internal Medicine

## 2020-06-05 ENCOUNTER — Ambulatory Visit (INDEPENDENT_AMBULATORY_CARE_PROVIDER_SITE_OTHER): Payer: Medicare Other | Admitting: Internal Medicine

## 2020-06-05 VITALS — BP 110/78 | HR 67 | Temp 98.3°F | Resp 20 | Ht 67.01 in | Wt 205.8 lb

## 2020-06-05 DIAGNOSIS — I10 Essential (primary) hypertension: Secondary | ICD-10-CM | POA: Diagnosis not present

## 2020-06-05 DIAGNOSIS — J4520 Mild intermittent asthma, uncomplicated: Secondary | ICD-10-CM | POA: Diagnosis not present

## 2020-06-05 DIAGNOSIS — Z124 Encounter for screening for malignant neoplasm of cervix: Secondary | ICD-10-CM | POA: Diagnosis not present

## 2020-06-05 DIAGNOSIS — K219 Gastro-esophageal reflux disease without esophagitis: Secondary | ICD-10-CM

## 2020-06-05 DIAGNOSIS — M791 Myalgia, unspecified site: Secondary | ICD-10-CM

## 2020-06-05 DIAGNOSIS — Z1151 Encounter for screening for human papillomavirus (HPV): Secondary | ICD-10-CM | POA: Insufficient documentation

## 2020-06-05 DIAGNOSIS — Z0001 Encounter for general adult medical examination with abnormal findings: Secondary | ICD-10-CM

## 2020-06-05 DIAGNOSIS — E7800 Pure hypercholesterolemia, unspecified: Secondary | ICD-10-CM

## 2020-06-05 DIAGNOSIS — M3390 Dermatopolymyositis, unspecified, organ involvement unspecified: Secondary | ICD-10-CM

## 2020-06-05 DIAGNOSIS — F3341 Major depressive disorder, recurrent, in partial remission: Secondary | ICD-10-CM | POA: Diagnosis not present

## 2020-06-05 DIAGNOSIS — M545 Low back pain, unspecified: Secondary | ICD-10-CM | POA: Diagnosis not present

## 2020-06-05 DIAGNOSIS — Z86010 Personal history of colonic polyps: Secondary | ICD-10-CM

## 2020-06-05 DIAGNOSIS — Z0000 Encounter for general adult medical examination without abnormal findings: Secondary | ICD-10-CM

## 2020-06-05 NOTE — Progress Notes (Signed)
Patient ID: [name], female   DOB: September 30, 1960, 59 y.o.   MRN: [id number]   Subjective:    Patient ID: [name], female    DOB: 07-24-1961, 59 y.o.   MRN: [id number]  HPI This visit occurred during the SARS-CoV-2 public health emergency.  Safety protocols were in place, including screening questions prior to the visit, additional usage of staff PPE, and extensive cleaning of exam room while observing appropriate contact time as indicated for disinfecting solutions.  Patient here for her physical exam.  Still with increased pain.  Followed by pain clinic.  This appears to be her main concern today.  States her whole body aches.  States will break out into a sweat at times.  Will start itching when this occurs.  Face will turn red.  Stomach will get upset - diarrhea.  This will occur 2-3 days out of every 1-2 weeks.  Normally has trouble with constipation.  Some persistent issues with urinating.  Has to sit and strain.  Has seen and been worked up by urology.  She has been followed by ENT for persistent sinus issues.  Recommended isotonic nasal irrigation and steroid nasal spray (also bactroban nasal irrigations).  This has helped.  No significant problems now.  Breathing better.  trelegy has helped.  No chest pain.  Blood pressure doing well.  Discussed the need to get up and move around more.  Needs to try to stay as active as possible.  Also discussed diet and exercise.  Needs to cut out soda.    Past Medical History:  Diagnosis Date  . Allergic rhinitis   . Anxiety   . Arthralgia   . Asthma   . Bronchitis, chronic ([place])   . Chronic back pain   . Collagenous colitis    followed by Dr Tiffany Kocher  . Depression   . Diplopia 11/15/2013  . Dysphagia   . Endometriosis   . Fibrocystic breast disease   . GERD (gastroesophageal reflux disease)   . History of colon polyps   . Hyperlipidemia   . Hypertension   . Hypokalemia   . IBS (irritable bowel syndrome)   . Memory difficulty 11/15/2013    . Migraine headache   . Pneumonia 2017   x2  . PONV (postoperative nausea and vomiting)    used patch  . Sleep apnea    recommended CPAP  . Ulcer disease   . Urine incontinence    Past Surgical History:  Procedure Laterality Date  . ABDOMINAL HYSTERECTOMY     endometriosis  . BACK SURGERY     x6  . BREAST BIOPSY Right   . COLONOSCOPY WITH PROPOFOL N/A 06/25/2015   Procedure: COLONOSCOPY WITH PROPOFOL;  Surgeon: Manya Silvas, MD;  Location: [facility] ENDOSCOPY;  Service: Endoscopy;  Laterality: N/A;  . DILATION AND CURETTAGE OF UTERUS     and hysteroscopy  . ESOPHAGOGASTRODUODENOSCOPY N/A 06/25/2015   Procedure: ESOPHAGOGASTRODUODENOSCOPY (EGD);  Surgeon: Manya Silvas, MD;  Location: [facility] ENDOSCOPY;  Service: Endoscopy;  Laterality: N/A;  . insertion of permanent spinal cord stimulator    . insertion of trial spinal cord stimulator    . LUMBAR DISC SURGERY    . LUMBAR FUSION  4/05 and 8/07  . LUMBAR MICRODISCECTOMY  03/28/03   L4-5 L5  . MOUTH SURGERY    . NASAL SEPTOPLASTY W/ TURBINOPLASTY    . removal of spinal cord stimulator    . SEPTOPLASTY     with reduction of turbinates  .  THYROIDECTOMY N/A 06/06/2016   Procedure: THYROIDECTOMY;  Surgeon: Robert Bellow, MD;  Location: [facility] ORS;  Service: General;  Laterality: N/A;   Family History  Problem Relation Age of Onset  . Allergies Mother   . Hypertension Mother   . Arthritis Mother   . Hyperlipidemia Mother   . Stroke Mother   . Hypertension Father   . Arthritis Father   . Diabetes Father   . Hypertension Brother   . Diabetes Brother   . Arthritis Maternal Grandmother   . Hyperlipidemia Maternal Grandmother   . Hyperlipidemia Maternal Grandfather   . Arthritis Paternal Grandmother   . Arthritis Paternal Grandfather   . Asthma Other        several family member  . Leukemia Other        uncle  . Lymphoma Maternal Aunt        non hodgkins   Social History   Socioeconomic History  . Marital status:  Married    Spouse name: douglas  . Number of children: 1  . Years of education: 50 th  . Highest education level: High school graduate  Occupational History  . Occupation: Disabled    Employer: DISABLED  Tobacco Use  . Smoking status: Never Smoker  . Smokeless tobacco: Never Used  Vaping Use  . Vaping Use: Never used  Substance and Sexual Activity  . Alcohol use: No    Alcohol/week: 0.0 standard drinks  . Drug use: No  . Sexual activity: Not Currently  Other Topics Concern  . Not on file  Social History Narrative  . Not on file   Social Determinants of Health   Financial Resource Strain: Low Risk   . Difficulty of Paying Living Expenses: Not hard at all  Food Insecurity: No Food Insecurity  . Worried About Charity fundraiser in the Last Year: Never true  . Ran Out of Food in the Last Year: Never true  Transportation Needs: No Transportation Needs  . Lack of Transportation (Medical): No  . Lack of Transportation (Non-Medical): No  Physical Activity:   . Days of Exercise per Week: Not on file  . Minutes of Exercise per Session: Not on file  Stress: No Stress Concern Present  . Feeling of Stress : Not at all  Social Connections: Unknown  . Frequency of Communication with Friends and Family: Not on file  . Frequency of Social Gatherings with Friends and Family: Not on file  . Attends Religious Services: Not on file  . Active Member of Clubs or Organizations: Not on file  . Attends Archivist Meetings: Not on file  . Marital Status: Married    Outpatient Encounter Medications as of 06/05/2020  Medication Sig  . albuterol (PROVENTIL) (2.5 MG/3ML) 0.083% nebulizer solution Take 2.5 mg by nebulization every 4 (four) hours as needed for wheezing or shortness of breath.  Marland Kitchen albuterol (VENTOLIN HFA) 108 (90 Base) MCG/ACT inhaler Inhale 2 puffs into the lungs every 4 (four) hours as needed for wheezing or shortness of breath.  Marland Kitchen amLODipine (NORVASC) 5 MG tablet TAKE 1  TABLET BY MOUTH EVERY DAY  . bisacodyl (DULCOLAX) 5 MG EC tablet Take by mouth.  . diazepam (VALIUM) 5 MG tablet Take 5 mg by mouth 3 (three) times daily as needed.    . docusate sodium (COLACE) 100 MG capsule Take 100 mg by mouth 2 (two) times daily.  . Fluticasone-Umeclidin-Vilant (TRELEGY ELLIPTA) 100-62.5-25 MCG/INH AEPB Inhale 1 puff into the lungs daily.  Marland Kitchen  GRALISE 600 MG TABS   . hydrochlorothiazide (HYDRODIURIL) 25 MG tablet Take 25 mg by mouth daily.  . metoprolol tartrate (LOPRESSOR) 50 MG tablet TAKE 1/2 TABLET BY MOUTH TWICE A DAY (NEED OFFICE VISIT)  . Multiple Vitamin (MULTIVITAMIN) capsule Take 1 capsule by mouth daily.    . Omega-3 Fatty Acids (FISH OIL) 1200 MG CAPS Take 1 capsule by mouth daily.  Marland Kitchen omeprazole (PRILOSEC) 40 MG capsule TAKE 1 CAPSULE (40 MG TOTAL) BY MOUTH AT BEDTIME.  Marland Kitchen OS-CAL CALCIUM + D3 500-200 MG-UNIT TABS TAKE 2 TABLETS BY MOUTH 2 (TWO) TIMES DAILY.  Marland Kitchen Oxycodone HCl 10 MG TABS Take 10 mg by mouth 3 (three) times daily as needed.  . potassium chloride (K-DUR) 10 MEQ tablet TAKE 1 TABLET BY MOUTH TWICE A DAY  . pravastatin (PRAVACHOL) 10 MG tablet TAKE 1 TABLET (10 MG TOTAL) BY MOUTH DAILY.  Marland Kitchen TIROSINT 137 MCG CAPS TAKE 1 CAPSULE BY MOUTH DAILY BEFORE BREAKFAST.  Marland Kitchen venlafaxine XR (EFFEXOR XR) 75 MG 24 hr capsule Take 3 capsules in the morning  . XTAMPZA ER 36 MG C12A TAKE 1 CAPSULE BY MOUTH EVERY 12 HOURS  . zolpidem (AMBIEN) 10 MG tablet Take 10 mg by mouth at bedtime as needed.    . [DISCONTINUED] pravastatin (PRAVACHOL) 10 MG tablet TAKE 1 TABLET (10 MG TOTAL) BY MOUTH DAILY.  . [DISCONTINUED] ARIPiprazole (ABILIFY) 2 MG tablet Take 1 tablet (2 mg total) by mouth daily. (Patient not taking: Reported on 06/05/2020)  . [DISCONTINUED] montelukast (SINGULAIR) 10 MG tablet Take 1 tablet (10 mg total) by mouth at bedtime. (notify office for refill if tolerates) (Patient not taking: Reported on 06/05/2020)  . [DISCONTINUED] triamcinolone (NASACORT AQ) 55 MCG/ACT  AERO nasal inhaler Place 2 sprays into the nose 2 (two) times daily. 1 sprays each nostril twice a day   No facility-administered encounter medications on file as of 06/05/2020.    Review of Systems  Constitutional: Negative for appetite change, fever and unexpected weight change.  HENT: Negative for congestion, sinus pressure and sore throat.        No significant congestion now.  Doing much better with current regimen as outlined.    Eyes: Negative for pain and visual disturbance.  Respiratory: Negative for cough, chest tightness and shortness of breath.        Breathing better.   Cardiovascular: Negative for chest pain, palpitations and leg swelling.  Gastrointestinal: Positive for constipation. Negative for abdominal pain, diarrhea, nausea and vomiting.  Genitourinary: Positive for difficulty urinating. Negative for dysuria and frequency.  Musculoskeletal: Positive for back pain. Negative for joint swelling.  Skin: Negative for color change and rash.  Neurological: Negative for dizziness, light-headedness and headaches.  Hematological: Negative for adenopathy. Does not bruise/bleed easily.  Psychiatric/Behavioral: Negative for agitation.       Depression and increased stress as outlined.         Objective:    Physical Exam Vitals reviewed.  Constitutional:      General: She is not in acute distress.    Appearance: Normal appearance. She is well-developed.  HENT:     Head: Normocephalic and atraumatic.     Right Ear: External ear normal.     Left Ear: External ear normal.  Eyes:     General: No scleral icterus.       Right eye: No discharge.        Left eye: No discharge.     Conjunctiva/sclera: Conjunctivae normal.  Neck:  Thyroid: No thyromegaly.  Cardiovascular:     Rate and Rhythm: Normal rate and regular rhythm.  Pulmonary:     Effort: No tachypnea, accessory muscle usage or respiratory distress.     Breath sounds: Normal breath sounds. No decreased breath  sounds or wheezing.  Chest:     Breasts:        Right: No inverted nipple, mass, nipple discharge or tenderness (no axillary adenopathy).        Left: No inverted nipple, mass, nipple discharge or tenderness (no axilarry adenopathy).  Abdominal:     General: Bowel sounds are normal.     Palpations: Abdomen is soft.     Tenderness: There is no abdominal tenderness.  Genitourinary:    Comments: Normal external genitalia.  Vaginal vault without lesions.  Cervix identified.  Pap smear performed.  Could not appreciate any adnexal masses or tenderness.   Musculoskeletal:        General: No swelling or tenderness.     Cervical back: Neck supple. No tenderness.  Lymphadenopathy:     Cervical: No cervical adenopathy.  Skin:    Findings: No erythema or rash.  Neurological:     Mental Status: She is alert and oriented to person, place, and time.  Psychiatric:        Mood and Affect: Mood normal.        Behavior: Behavior normal.     BP 110/78 (BP Location: Left Arm, Patient Position: Sitting)   Pulse 67   Temp 98.3 F (36.8 C)   Resp 20   Ht 5' 7.01" (1.702 m)   Wt 205 lb 12.8 oz (93.4 kg)   SpO2 97%   BMI 32.23 kg/m  Wt Readings from Last 3 Encounters:  06/05/20 205 lb 12.8 oz (93.4 kg)  05/05/20 196 lb (88.9 kg)  12/17/19 196 lb 6.4 oz (89.1 kg)     Lab Results  Component Value Date   WBC 6.8 05/28/2020   HGB 14.2 05/28/2020   HCT 41.3 05/28/2020   PLT 337.0 05/28/2020   GLUCOSE 83 05/28/2020   CHOL 267 (H) 05/28/2020   TRIG 284.0 (H) 05/28/2020   HDL 36.50 (L) 05/28/2020   LDLDIRECT 159.0 05/28/2020   LDLCALC 121 (H) 07/12/2016   ALT 19 05/28/2020   AST 20 05/28/2020   NA 141 05/28/2020   K 3.5 05/28/2020   CL 98 05/28/2020   CREATININE 1.10 05/28/2020   BUN 15 05/28/2020   CO2 34 (H) 05/28/2020   TSH 2.02 05/28/2020   HGBA1C 5.4 05/28/2020    CT MAXILLOFACIAL WO CONTRAST  Result Date: 12/05/2019 CLINICAL DATA:  Chronic pansinusitis. EXAM: CT MAXILLOFACIAL  WITHOUT CONTRAST TECHNIQUE: Multidetector CT images of the paranasal sinuses were obtained using the standard protocol without intravenous contrast. COMPARISON:  Head CT 05/31/2013 report from maxillofacial CT 01/06/1998 (images unavailable) FINDINGS: Paranasal sinuses: Frontal: There is extensive partial opacification of the right frontal sinus. Moderate mucosal thickening within the inferior left frontal sinus. The frontal sinus drainage pathways are opacified bilaterally. Ethmoid: Severe bilateral ethmoid sinus mucosal thickening with extensive partial opacification of the ethmoid air cells. Maxillary: Moderate mucosal thickening within the bilateral maxillary sinuses (greater on the right). Sphenoid: Moderate mucosal thickening within the bilateral sphenoid sinuses. The sphenoethmoidal recesses are predominantly opacified bilaterally. Right ostiomeatal unit: The ethmoid infundibulum and hiatus semilunaris are largely opacified. Left ostiomeatal unit: The ethmoid infundibulum and hiatus semilunaris are largely opacified. Nasal passages: Mild mucosal thickening within the bilateral nasal passages. Left middle turbinate  concha bullosa. The nasal septum is midline. Anatomy: No pneumatization superior to anterior ethmoid notches. Symmetric and intact olfactory grooves and fovea ethmoidalis, Keros I (1-40mm). Sellar sphenoid pneumatization pattern. The posterior left ethmoid air cell extends posteriorly above the left sphenoid sinus. IMPRESSION: Pansinusitis as detailed and most notably involving the ethmoid and right frontal sinuses. Moderate mucosal thickening is also present within the left frontal, bilateral sphenoid and bilateral maxillary sinuses. The sinus drainage pathways are largely obstructed, as described. Mild mucosal thickening within the bilateral nasal passages. Left middle turbinate concha bullosa. The posterior left ethmoid air cell extends posteriorly above the left sphenoid sinus. Electronically  Signed   By: Kellie Simmering DO   On: 12/05/2019 21:04       Assessment & Plan:   Problem List Items Addressed This Visit    MDD (major depressive disorder), recurrent, in partial remission ([place])    Being followed by Dr Toy Care.  On abilify and effexor.  Depression screening addressed with her.  She denies any suicidal ideations.  Reports that she does not fear dying - would not be in pain anymore.  States she would not hurt herself.  Mother recently passed away.  Still trying to cope with her death.  Has good support.  Feels she is doing better.  Continue f/u with Dr Toy Care.  Continue f/u with pain clinic.        Hypercholesterolemia    Previously on pravastatin. Has not been on for a while now. Ran out of medication.  Low cholesterol diet and exercise.  Follow lipid panel and liver function tests.  Discussed labs.  Increased cholesterol.  Restart medication.  If tolerates, will increase dose.  Follow lipid panel and liver function tests.        Relevant Medications   pravastatin (PRAVACHOL) 10 MG tablet   History of colonic polyps    Colonoscopy 06/2015 - tubular adenoma x 2.  Recommended f/u 06/2020.  Saw Dr Allen Norris.  Per records, due f/u colonoscopy.        Health care maintenance    Physical today 06/05/20.  PAP 06/05/20.  Colonoscopy 06/2015 as outlined.  Recommended f/u colonoscopy in 5 years.  Schedule mammogram.  Overdue.       GERD    Saw GI. No f/u EGD warranted.  Continue PPI.  No significant problems reported today.        Essential hypertension, benign    Blood pressure doing well on amlodipine, hctz and metoprolol.  Follow pressures.  Follow metabolic panel.       Relevant Medications   pravastatin (PRAVACHOL) 10 MG tablet   Dermatomyositis ([place])    Per pt, was previously diagnosed with dermatomyositis.  She is having similar symptoms with aching, itching, feeling hot, etc.  States similar symptoms.  No rash.  Check esr and ck.  May need referral back to rheumatology for f/u.         Back pain    Chronic back pain as outlined.  Followed by pain clinic.        Asthma    Doing better on trelegy.  Breathing better.  Follow.         Other Visit Diagnoses    Encounter for general adult medical examination with abnormal findings    -  Primary   Muscle pain       Relevant Orders   Sedimentation rate (Completed)   CK (Creatine Kinase) (Completed)   Screening for cervical cancer  Relevant Orders   Cytology - PAP       Einar Pheasant, MD

## 2020-06-06 LAB — CK: Total CK: 132 U/L (ref 32–182)

## 2020-06-06 LAB — SEDIMENTATION RATE: Sed Rate: 31 mm/hr (ref 0–40)

## 2020-06-07 ENCOUNTER — Encounter: Payer: Self-pay | Admitting: Internal Medicine

## 2020-06-07 MED ORDER — PRAVASTATIN SODIUM 10 MG PO TABS
ORAL_TABLET | ORAL | 1 refills | Status: DC
Start: 2020-06-07 — End: 2020-12-02

## 2020-06-07 NOTE — Assessment & Plan Note (Signed)
Being followed by Dr Toy Care.  On abilify and effexor.  Depression screening addressed with her.  She denies any suicidal ideations.  Reports that she does not fear dying - would not be in pain anymore.  States she would not hurt herself.  Mother recently passed away.  Still trying to cope with her death.  Has good support.  Feels she is doing better.  Continue f/u with Dr Toy Care.  Continue f/u with pain clinic.

## 2020-06-07 NOTE — Assessment & Plan Note (Signed)
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  Recommended f/u 06/2020.  Saw Dr Allen Norris.  Per records, due f/u colonoscopy.

## 2020-06-07 NOTE — Assessment & Plan Note (Signed)
Per pt, was previously diagnosed with dermatomyositis.  She is having similar symptoms with aching, itching, feeling hot, etc.  States similar symptoms.  No rash.  Check esr and ck.  May need referral back to rheumatology for f/u.

## 2020-06-07 NOTE — Assessment & Plan Note (Signed)
Previously on pravastatin. Has not been on for a while now. Ran out of medication.  Low cholesterol diet and exercise.  Follow lipid panel and liver function tests.  Discussed labs.  Increased cholesterol.  Restart medication.  If tolerates, will increase dose.  Follow lipid panel and liver function tests.

## 2020-06-07 NOTE — Assessment & Plan Note (Signed)
Chronic back pain as outlined.  Followed by pain clinic.

## 2020-06-07 NOTE — Assessment & Plan Note (Signed)
Physical today 06/05/20.  PAP 06/05/20.  Colonoscopy 06/2015 as outlined.  Recommended f/u colonoscopy in 5 years.  Schedule mammogram.  Overdue.

## 2020-06-07 NOTE — Assessment & Plan Note (Signed)
Saw GI. No f/u EGD warranted.  Continue PPI.  No significant problems reported today.

## 2020-06-07 NOTE — Assessment & Plan Note (Signed)
Doing better on trelegy.  Breathing better.  Follow.

## 2020-06-07 NOTE — Assessment & Plan Note (Signed)
Blood pressure doing well on amlodipine, hctz and metoprolol.  Follow pressures.  Follow metabolic panel.

## 2020-06-08 DIAGNOSIS — G4700 Insomnia, unspecified: Secondary | ICD-10-CM | POA: Diagnosis not present

## 2020-06-08 DIAGNOSIS — G894 Chronic pain syndrome: Secondary | ICD-10-CM | POA: Diagnosis not present

## 2020-06-08 DIAGNOSIS — M47817 Spondylosis without myelopathy or radiculopathy, lumbosacral region: Secondary | ICD-10-CM | POA: Diagnosis not present

## 2020-06-08 DIAGNOSIS — M62830 Muscle spasm of back: Secondary | ICD-10-CM | POA: Diagnosis not present

## 2020-06-09 LAB — CYTOLOGY - PAP
Comment: NEGATIVE
Diagnosis: NEGATIVE
High risk HPV: NEGATIVE

## 2020-06-11 ENCOUNTER — Other Ambulatory Visit: Payer: Self-pay | Admitting: Internal Medicine

## 2020-06-11 DIAGNOSIS — M2550 Pain in unspecified joint: Secondary | ICD-10-CM

## 2020-06-11 NOTE — Progress Notes (Signed)
Order placed for rheumatology referral.  

## 2020-06-15 ENCOUNTER — Other Ambulatory Visit: Payer: Self-pay | Admitting: Internal Medicine

## 2020-06-20 ENCOUNTER — Other Ambulatory Visit: Payer: Self-pay | Admitting: Internal Medicine

## 2020-06-27 ENCOUNTER — Other Ambulatory Visit: Payer: Self-pay | Admitting: Internal Medicine

## 2020-06-27 DIAGNOSIS — Z1231 Encounter for screening mammogram for malignant neoplasm of breast: Secondary | ICD-10-CM

## 2020-06-27 NOTE — Progress Notes (Signed)
Order placed for mammogram.

## 2020-06-29 ENCOUNTER — Encounter: Payer: Self-pay | Admitting: Internal Medicine

## 2020-07-11 ENCOUNTER — Other Ambulatory Visit: Payer: Self-pay | Admitting: Pulmonary Disease

## 2020-07-11 ENCOUNTER — Other Ambulatory Visit: Payer: Self-pay | Admitting: Internal Medicine

## 2020-07-11 ENCOUNTER — Other Ambulatory Visit (HOSPITAL_COMMUNITY): Payer: Self-pay | Admitting: Psychiatry

## 2020-07-11 DIAGNOSIS — F411 Generalized anxiety disorder: Secondary | ICD-10-CM

## 2020-07-11 DIAGNOSIS — F3341 Major depressive disorder, recurrent, in partial remission: Secondary | ICD-10-CM

## 2020-07-23 DIAGNOSIS — M47817 Spondylosis without myelopathy or radiculopathy, lumbosacral region: Secondary | ICD-10-CM | POA: Diagnosis not present

## 2020-07-23 DIAGNOSIS — M62830 Muscle spasm of back: Secondary | ICD-10-CM | POA: Diagnosis not present

## 2020-07-23 DIAGNOSIS — G4700 Insomnia, unspecified: Secondary | ICD-10-CM | POA: Diagnosis not present

## 2020-07-23 DIAGNOSIS — G894 Chronic pain syndrome: Secondary | ICD-10-CM | POA: Diagnosis not present

## 2020-07-23 DIAGNOSIS — Z79891 Long term (current) use of opiate analgesic: Secondary | ICD-10-CM | POA: Diagnosis not present

## 2020-07-27 ENCOUNTER — Encounter (HOSPITAL_COMMUNITY): Payer: Self-pay | Admitting: Psychiatry

## 2020-07-27 ENCOUNTER — Telehealth (INDEPENDENT_AMBULATORY_CARE_PROVIDER_SITE_OTHER): Payer: Medicare Other | Admitting: Psychiatry

## 2020-07-27 ENCOUNTER — Other Ambulatory Visit: Payer: Self-pay

## 2020-07-27 DIAGNOSIS — F411 Generalized anxiety disorder: Secondary | ICD-10-CM

## 2020-07-27 DIAGNOSIS — Z634 Disappearance and death of family member: Secondary | ICD-10-CM | POA: Diagnosis not present

## 2020-07-27 DIAGNOSIS — F3341 Major depressive disorder, recurrent, in partial remission: Secondary | ICD-10-CM

## 2020-07-27 MED ORDER — BREXPIPRAZOLE 1 MG PO TABS: 1.0000 mg | ORAL_TABLET | Freq: Every day | ORAL | 0 refills | Status: DC

## 2020-07-27 MED ORDER — VENLAFAXINE HCL ER 75 MG PO CP24: ORAL_CAPSULE | ORAL | 0 refills | Status: DC

## 2020-07-27 NOTE — Progress Notes (Signed)
[facility] MD OP Progress Note  Virtual Visit via Video Note  I connected with [name] on 07/27/20 at  2:40 PM EST by a video enabled telemedicine application and verified that I am speaking with the correct person using two identifiers.  Location: Patient: Home Provider: Clinic   I discussed the limitations of evaluation and management by telemedicine and the availability of in person appointments. The patient expressed understanding and agreed to proceed.  I provided 16 minutes of non-face-to-face time during this encounter.      07/27/2020 3:02 PM [name]  MRN:  [id number]  Chief Complaint:  " I am still feeling depressed."  HPI: Patient reported that she still misses her deceased mother.  She stated that she still keeps thinking about her.  She did not visit her father for Thanksgiving and she spent most of the time thinking about her mother.  She stated that she does not know what to do to she was feeling off.  She informed that she stopped taking the Abilify because she started seeing things like bugs on the walls. She stated that her husband thinks that she can do better and she also believes that she can do better than this. She ruminated about how things were different when her mother was around.  She spoke about her grandchildren coming over for Christmas.  She stated that she saw a commercial for a medicine called Rexulti that can be taken with her depression medicine and asked if she can try it.  Writer informed her that it can be tried. Potential side effects of medication and risks vs benefits of treatment vs non-treatment were explained and discussed. All questions were answered.   Visit Diagnosis:    ICD-10-CM   1. MDD (major depressive disorder), recurrent, in partial remission (HCC)  F33.41 venlafaxine XR (EFFEXOR-XR) 75 MG 24 hr capsule    brexpiprazole (REXULTI) 1 MG TABS tablet  2. Anxiety state  F41.1 venlafaxine XR (EFFEXOR-XR) 75 MG 24 hr capsule  3.  Bereavement  Z63.4     Past Psychiatric History: See above  Past Medical History:  Past Medical History:  Diagnosis Date  . Allergic rhinitis   . Anxiety   . Arthralgia   . Asthma   . Bronchitis, chronic ([place])   . Chronic back pain   . Collagenous colitis    followed by Dr Tiffany Kocher  . Depression   . Diplopia 11/15/2013  . Dysphagia   . Endometriosis   . Fibrocystic breast disease   . GERD (gastroesophageal reflux disease)   . History of colon polyps   . Hyperlipidemia   . Hypertension   . Hypokalemia   . IBS (irritable bowel syndrome)   . Memory difficulty 11/15/2013  . Migraine headache   . Pneumonia 2017   x2  . PONV (postoperative nausea and vomiting)    used patch  . Sleep apnea    recommended CPAP  . Ulcer disease   . Urine incontinence     Past Surgical History:  Procedure Laterality Date  . ABDOMINAL HYSTERECTOMY     endometriosis  . BACK SURGERY     x6  . BREAST BIOPSY Right   . COLONOSCOPY WITH PROPOFOL N/A 06/25/2015   Procedure: COLONOSCOPY WITH PROPOFOL;  Surgeon: Manya Silvas, MD;  Location: [facility] ENDOSCOPY;  Service: Endoscopy;  Laterality: N/A;  . DILATION AND CURETTAGE OF UTERUS     and hysteroscopy  . ESOPHAGOGASTRODUODENOSCOPY N/A 06/25/2015   Procedure: ESOPHAGOGASTRODUODENOSCOPY (EGD);  Surgeon: Manya Silvas, MD;  Location: [facility] ENDOSCOPY;  Service: Endoscopy;  Laterality: N/A;  . insertion of permanent spinal cord stimulator    . insertion of trial spinal cord stimulator    . LUMBAR DISC SURGERY    . LUMBAR FUSION  4/05 and 8/07  . LUMBAR MICRODISCECTOMY  03/28/03   L4-5 L5  . MOUTH SURGERY    . NASAL SEPTOPLASTY W/ TURBINOPLASTY    . removal of spinal cord stimulator    . SEPTOPLASTY     with reduction of turbinates  . THYROIDECTOMY N/A 06/06/2016   Procedure: THYROIDECTOMY;  Surgeon: Robert Bellow, MD;  Location: [facility] ORS;  Service: General;  Laterality: N/A;    Family Psychiatric History: Denied  Family History:  Family  History  Problem Relation Age of Onset  . Allergies Mother   . Hypertension Mother   . Arthritis Mother   . Hyperlipidemia Mother   . Stroke Mother   . Hypertension Father   . Arthritis Father   . Diabetes Father   . Hypertension Brother   . Diabetes Brother   . Arthritis Maternal Grandmother   . Hyperlipidemia Maternal Grandmother   . Hyperlipidemia Maternal Grandfather   . Arthritis Paternal Grandmother   . Arthritis Paternal Grandfather   . Asthma Other        several family member  . Leukemia Other        uncle  . Lymphoma Maternal Aunt        non hodgkins    Social History:  Social History   Socioeconomic History  . Marital status: Married    Spouse name: douglas  . Number of children: 1  . Years of education: 93 th  . Highest education level: High school graduate  Occupational History  . Occupation: Disabled    Employer: DISABLED  Tobacco Use  . Smoking status: Never Smoker  . Smokeless tobacco: Never Used  Vaping Use  . Vaping Use: Never used  Substance and Sexual Activity  . Alcohol use: No    Alcohol/week: 0.0 standard drinks  . Drug use: No  . Sexual activity: Not Currently  Other Topics Concern  . Not on file  Social History Narrative  . Not on file   Social Determinants of Health   Financial Resource Strain: Low Risk   . Difficulty of Paying Living Expenses: Not hard at all  Food Insecurity: No Food Insecurity  . Worried About Charity fundraiser in the Last Year: Never true  . Ran Out of Food in the Last Year: Never true  Transportation Needs: No Transportation Needs  . Lack of Transportation (Medical): No  . Lack of Transportation (Non-Medical): No  Physical Activity: Not on file  Stress: No Stress Concern Present  . Feeling of Stress : Not at all  Social Connections: Unknown  . Frequency of Communication with Friends and Family: Not on file  . Frequency of Social Gatherings with Friends and Family: Not on file  . Attends Religious  Services: Not on file  . Active Member of Clubs or Organizations: Not on file  . Attends Archivist Meetings: Not on file  . Marital Status: Married    Allergies:  Allergies  Allergen Reactions  . Fentanyl Other (See Comments)    Other reaction(s): Headache, Other (See Comments) headache SEVERE HEADACHE headache  . Sumatriptan Other (See Comments)    Other reaction(s): Headache Severe headache  . Doxycycline Other (See Comments)    Other reaction(s):  Headache, Other (See Comments) REACTION: causes severe abd pain REACTION: causes severe abd pain REACTION: causes severe abd pain  . Montelukast Other (See Comments)    SEVERE HEADACHE  . Oxymorphone Other (See Comments)    REACTION: severe headaches  . Benzoin Rash  . Buspirone Other (See Comments)    SEVERE HEADACHE  . Ciprofloxacin Nausea Only  . Conjugated Estrogens Other (See Comments)    headaches  . Cymbalta [Duloxetine Hcl] Other (See Comments)    Headache  . Duloxetine Other (See Comments)    Headache  . Erythromycin Ethylsuccinate Other (See Comments)    Stomach ache  . Estradiol Other (See Comments)    headache  . Metronidazole Other (See Comments)    abd pain  . Mirtazapine Other (See Comments)    headache    Metabolic Disorder Labs: Lab Results  Component Value Date   HGBA1C 5.4 05/28/2020   No results found for: PROLACTIN Lab Results  Component Value Date   CHOL 267 (H) 05/28/2020   TRIG 284.0 (H) 05/28/2020   HDL 36.50 (L) 05/28/2020   CHOLHDL 7 05/28/2020   VLDL 56.8 (H) 05/28/2020   LDLCALC 121 (H) 07/12/2016   LDLCALC 187 (H) 08/03/2015   Lab Results  Component Value Date   TSH 2.02 05/28/2020   TSH 1.78 02/23/2018    Therapeutic Level Labs: No results found for: LITHIUM No results found for: VALPROATE No components found for:  CBMZ  Current Medications: Current Outpatient Medications  Medication Sig Dispense Refill  . albuterol (PROVENTIL) (2.5 MG/3ML) 0.083%  nebulizer solution Take 2.5 mg by nebulization every 4 (four) hours as needed for wheezing or shortness of breath.    Marland Kitchen albuterol (VENTOLIN HFA) 108 (90 Base) MCG/ACT inhaler Inhale 2 puffs into the lungs every 4 (four) hours as needed for wheezing or shortness of breath. 1 Inhaler 3  . amLODipine (NORVASC) 5 MG tablet TAKE 1 TABLET BY MOUTH EVERY DAY 90 tablet 1  . bisacodyl (DULCOLAX) 5 MG EC tablet Take by mouth.    . brexpiprazole (REXULTI) 1 MG TABS tablet Take 1 tablet (1 mg total) by mouth daily. 90 tablet 0  . diazepam (VALIUM) 5 MG tablet Take 5 mg by mouth 3 (three) times daily as needed.      . docusate sodium (COLACE) 100 MG capsule Take 100 mg by mouth 2 (two) times daily.    . Fluticasone-Umeclidin-Vilant (TRELEGY ELLIPTA) 100-62.5-25 MCG/INH AEPB Inhale 1 puff into the lungs daily. 1 each 11  . GRALISE 600 MG TABS     . hydrochlorothiazide (HYDRODIURIL) 25 MG tablet Take 25 mg by mouth daily.  11  . metoprolol tartrate (LOPRESSOR) 50 MG tablet TAKE 1/2 TABLET BY MOUTH TWICE A DAY (NEED OFFICE VISIT) 90 tablet 0  . Multiple Vitamin (MULTIVITAMIN) capsule Take 1 capsule by mouth daily.      . Omega-3 Fatty Acids (FISH OIL) 1200 MG CAPS Take 1 capsule by mouth daily.    Marland Kitchen omeprazole (PRILOSEC) 40 MG capsule TAKE 1 CAPSULE (40 MG TOTAL) BY MOUTH AT BEDTIME. 90 capsule 1  . OS-CAL CALCIUM + D3 500-200 MG-UNIT TABS TAKE 2 TABLETS BY MOUTH 2 (TWO) TIMES DAILY.  6  . Oxycodone HCl 10 MG TABS Take 10 mg by mouth 3 (three) times daily as needed.  0  . potassium chloride (K-DUR) 10 MEQ tablet TAKE 1 TABLET BY MOUTH TWICE A DAY 60 tablet 0  . pravastatin (PRAVACHOL) 10 MG tablet TAKE 1 TABLET (10 MG  TOTAL) BY MOUTH DAILY. 90 tablet 1  . TIROSINT 137 MCG CAPS TAKE 1 CAPSULE BY MOUTH DAILY BEFORE BREAKFAST. 30 capsule 2  . venlafaxine XR (EFFEXOR-XR) 75 MG 24 hr capsule Take 3 capsules in the morning 270 capsule 0  . XTAMPZA ER 36 MG C12A TAKE 1 CAPSULE BY MOUTH EVERY 12 HOURS  0  . zolpidem  (AMBIEN) 10 MG tablet Take 10 mg by mouth at bedtime as needed.       No current facility-administered medications for this visit.     Musculoskeletal: Strength & Muscle Tone: unable to assess due to telemed visit Gait & Station: unable to assess due to telemed visit Patient leans: unable to assess due to telemed visit   Psychiatric Specialty Exam: ROS  There were no vitals taken for this visit.There is no height or weight on file to calculate BMI.  General Appearance: Fairly groomed  Eye Contact: Good  Speech:  Clear and Coherent and Normal Rate  Volume:  Normal  Mood:  Depressed  Affect:  Congruent  Thought Process:  Goal Directed, Linear and Descriptions of Associations: Intact  Orientation:  Full (Time, Place, and Person)  Thought Content: Logical   Suicidal Thoughts:  No  Homicidal Thoughts:  No  Memory:  Recent;   Good Remote;   Good  Judgement:  Fair  Insight:  Fair  Psychomotor Activity:  Normal  Concentration:  Concentration: Good and Attention Span: Good  Recall:  Good  Fund of Knowledge: Good  Language: Good  Akathisia:  Negative  Handed:  Right  AIMS (if indicated): not done  Assets:  Communication Skills Desire for Improvement Housing Social Support Transportation  ADL's:  Intact  Cognition: WNL  Sleep:  Fair   Screenings: PHQ2-9   [place] Office Visit from 06/05/2020 in [place] from 05/05/2020 in [place] Office Visit from 04/30/2019 in [place] from 03/13/2019 in [place] from 01/14/2016 in [place]  PHQ-2 Total Score 5 1 4  0 5  PHQ-9 Total Score 20 -- 14 -- 17       Assessment and Plan: Patient is still grieving the loss of her mother who passed away earlier this year.  She would like to try Rexulti for augmentation of venlafaxine.  She stated that she started having visual  hallucinations with Abilify and she stopped taking it. Potential side effects of medication and risks vs benefits of treatment vs non-treatment were explained and discussed. All questions were answered.   1. MDD (major depressive disorder), recurrent, in partial remission (HCC)  - venlafaxine XR (EFFEXOR-XR) 75 MG 24 hr capsule; Take 3 capsules in the morning  Dispense: 270 capsule; Refill: 0 - Start brexpiprazole (REXULTI) 1 MG TABS tablet; Take 1 tablet (1 mg total) by mouth daily.  Dispense: 90 tablet; Refill: 0  2. Anxiety state  - venlafaxine XR (EFFEXOR-XR) 75 MG 24 hr capsule; Take 3 capsules in the morning  Dispense: 270 capsule; Refill: 0  3. Bereavement    Pt is continuing to take Diazepam, Oxycodone and Ambien prescribed by her pain management specialist.  Much supportive psychotherapy was provided. F/up in 10 weeks.  [place] Crane, MD 07/27/2020, 3:02 PM

## 2020-08-03 ENCOUNTER — Telehealth (HOSPITAL_COMMUNITY): Payer: Self-pay | Admitting: *Deleted

## 2020-08-03 NOTE — Telephone Encounter (Signed)
Will resubmit with that infor.

## 2020-08-03 NOTE — Telephone Encounter (Signed)
Response back re prior authorization completed for her Rexulti rx. It was denied because she had not failed tow alterantive meds from the formulary she only failed Abilify. She will have to have tried and failed any of the following: clozapine, olanzapine, quetiapine, risperidone vor ziprasidone. Will bring to Drs attention and see if any changes can be made.

## 2020-08-03 NOTE — Telephone Encounter (Signed)
Pt has tried and failed Abilify most recently, prior to this she has tried and failed Seroquel.

## 2020-08-04 ENCOUNTER — Telehealth (HOSPITAL_COMMUNITY): Payer: Self-pay | Admitting: *Deleted

## 2020-08-04 NOTE — Telephone Encounter (Signed)
Left a detailed message on patients voice mail re her insurance co denying the [place] and their explanation as to why.

## 2020-08-04 NOTE — Telephone Encounter (Signed)
Can you contact the pt and let her know of the insurance determination. Thanks.

## 2020-08-04 NOTE — Telephone Encounter (Signed)
Received a determination from [place] re the request for patients Rexulti because we didn't show evidence she has been tried and failed two formulary drugs. Alternative medications include: clozapine, olanzapine, quetiapine, paliperidone, risperidone or ziprasidone. Will bring this to Dr Malachy Moan attention .

## 2020-08-11 ENCOUNTER — Telehealth (HOSPITAL_COMMUNITY): Payer: Self-pay | Admitting: *Deleted

## 2020-08-11 NOTE — Telephone Encounter (Signed)
Letter from [facility] stating she has been denied for her Rexulti because he hasnt tried and failed tow formulary meds. She has only failed Abilify. On her formulary are Clozapine, Olanzapine, Quetiapine, Paliperidone, Risperidone and Ziprasidone. Will bring this to [employer] attention.

## 2020-08-11 NOTE — Telephone Encounter (Signed)
Opened in error, no further documentation required.

## 2020-08-11 NOTE — Telephone Encounter (Signed)
Patient not under providers care. She is a patient of Dr. Carie Caddy.

## 2020-08-12 NOTE — Telephone Encounter (Signed)
Called patient as directed by Dr Evelene Croon to suggest to her trying Seroquel as her insurance refused to cover Rexulti without first trying and failing another formulary medicine. I left her a detailed message but at this point in the day I have not heard back from her.

## 2020-08-12 NOTE — Telephone Encounter (Signed)
Call the patient and let her know the insurance's decision. Ask her if she would like to try Seroquel 50 mg HS for augmentation of Effexor for mood stabilization.

## 2020-08-12 NOTE — Telephone Encounter (Signed)
Call the patient and let her know the insurance's decision. Ask her if she would like to try Seroquel 50 mg HS for augmentation of Effexor for mood stabilization. 

## 2020-08-13 ENCOUNTER — Telehealth (HOSPITAL_COMMUNITY): Payer: Self-pay | Admitting: *Deleted

## 2020-08-13 NOTE — Telephone Encounter (Signed)
Call back from patient in response to message left for her re med change. Unable to get approval thru prior authorization for her to get Rexulti thru her insurance. Dr Evelene Croon had suggested for her to be tried on Seroquel. [name] expressed her understanding and is agreement to try it. Will inform Dr Evelene Croon of decision when she returns to the office.

## 2020-08-17 MED ORDER — QUETIAPINE FUMARATE 50 MG PO TABS: 50.0000 mg | ORAL_TABLET | Freq: Every day | ORAL | 1 refills | Status: DC

## 2020-08-17 NOTE — Telephone Encounter (Signed)
Rx for Seroquel 50 mg to be taken at bedtime sent to the preferred pharmacy.

## 2020-08-17 NOTE — Addendum Note (Signed)
Addended by: Zena Amos on: 08/17/2020 08:02 PM   Modules accepted: Orders

## 2020-08-18 NOTE — Telephone Encounter (Signed)
Called to give direction re her new Rx for Seroquel. VM did not allow me to leave a message, will try again later.

## 2020-09-03 ENCOUNTER — Other Ambulatory Visit: Payer: Self-pay

## 2020-09-03 ENCOUNTER — Telehealth: Payer: Medicare Other | Admitting: Internal Medicine

## 2020-09-03 ENCOUNTER — Emergency Department: Payer: Medicare Other

## 2020-09-03 ENCOUNTER — Inpatient Hospital Stay
Admission: EM | Admit: 2020-09-03 | Discharge: 2020-09-07 | DRG: 177 | Disposition: A | Discharge status: Home Health | Payer: Medicare Other | Attending: Internal Medicine | Admitting: Internal Medicine

## 2020-09-03 DIAGNOSIS — Z8249 Family history of ischemic heart disease and other diseases of the circulatory system: Secondary | ICD-10-CM

## 2020-09-03 DIAGNOSIS — Z806 Family history of leukemia: Secondary | ICD-10-CM

## 2020-09-03 DIAGNOSIS — E876 Hypokalemia: Secondary | ICD-10-CM

## 2020-09-03 DIAGNOSIS — G8929 Other chronic pain: Secondary | ICD-10-CM | POA: Diagnosis present

## 2020-09-03 DIAGNOSIS — E039 Hypothyroidism, unspecified: Secondary | ICD-10-CM | POA: Diagnosis present

## 2020-09-03 DIAGNOSIS — M549 Dorsalgia, unspecified: Secondary | ICD-10-CM | POA: Diagnosis present

## 2020-09-03 DIAGNOSIS — Z823 Family history of stroke: Secondary | ICD-10-CM

## 2020-09-03 DIAGNOSIS — N179 Acute kidney failure, unspecified: Secondary | ICD-10-CM | POA: Diagnosis present

## 2020-09-03 DIAGNOSIS — J309 Allergic rhinitis, unspecified: Secondary | ICD-10-CM | POA: Diagnosis present

## 2020-09-03 DIAGNOSIS — R7401 Elevation of levels of liver transaminase levels: Secondary | ICD-10-CM | POA: Diagnosis present

## 2020-09-03 DIAGNOSIS — Z888 Allergy status to other drugs, medicaments and biological substances status: Secondary | ICD-10-CM

## 2020-09-03 DIAGNOSIS — A0839 Other viral enteritis: Secondary | ICD-10-CM | POA: Diagnosis not present

## 2020-09-03 DIAGNOSIS — J9601 Acute respiratory failure with hypoxia: Secondary | ICD-10-CM | POA: Diagnosis present

## 2020-09-03 DIAGNOSIS — R5381 Other malaise: Secondary | ICD-10-CM | POA: Diagnosis present

## 2020-09-03 DIAGNOSIS — Z7951 Long term (current) use of inhaled steroids: Secondary | ICD-10-CM

## 2020-09-03 DIAGNOSIS — R069 Unspecified abnormalities of breathing: Secondary | ICD-10-CM | POA: Diagnosis not present

## 2020-09-03 DIAGNOSIS — Z79890 Hormone replacement therapy (postmenopausal): Secondary | ICD-10-CM

## 2020-09-03 DIAGNOSIS — E785 Hyperlipidemia, unspecified: Secondary | ICD-10-CM | POA: Diagnosis present

## 2020-09-03 DIAGNOSIS — Z833 Family history of diabetes mellitus: Secondary | ICD-10-CM

## 2020-09-03 DIAGNOSIS — M545 Low back pain, unspecified: Secondary | ICD-10-CM | POA: Diagnosis not present

## 2020-09-03 DIAGNOSIS — Z83438 Family history of other disorder of lipoprotein metabolism and other lipidemia: Secondary | ICD-10-CM

## 2020-09-03 DIAGNOSIS — Z825 Family history of asthma and other chronic lower respiratory diseases: Secondary | ICD-10-CM

## 2020-09-03 DIAGNOSIS — J9600 Acute respiratory failure, unspecified whether with hypoxia or hypercapnia: Secondary | ICD-10-CM

## 2020-09-03 DIAGNOSIS — J42 Unspecified chronic bronchitis: Secondary | ICD-10-CM | POA: Diagnosis present

## 2020-09-03 DIAGNOSIS — Z7401 Bed confinement status: Secondary | ICD-10-CM

## 2020-09-03 DIAGNOSIS — J1282 Pneumonia due to coronavirus disease 2019: Secondary | ICD-10-CM | POA: Diagnosis present

## 2020-09-03 DIAGNOSIS — E860 Dehydration: Secondary | ICD-10-CM | POA: Diagnosis present

## 2020-09-03 DIAGNOSIS — R4182 Altered mental status, unspecified: Secondary | ICD-10-CM | POA: Diagnosis not present

## 2020-09-03 DIAGNOSIS — F32A Depression, unspecified: Secondary | ICD-10-CM | POA: Diagnosis present

## 2020-09-03 DIAGNOSIS — I959 Hypotension, unspecified: Secondary | ICD-10-CM | POA: Diagnosis not present

## 2020-09-03 DIAGNOSIS — Z79891 Long term (current) use of opiate analgesic: Secondary | ICD-10-CM

## 2020-09-03 DIAGNOSIS — N170 Acute kidney failure with tubular necrosis: Secondary | ICD-10-CM | POA: Diagnosis present

## 2020-09-03 DIAGNOSIS — Z885 Allergy status to narcotic agent status: Secondary | ICD-10-CM

## 2020-09-03 DIAGNOSIS — G4733 Obstructive sleep apnea (adult) (pediatric): Secondary | ICD-10-CM | POA: Diagnosis present

## 2020-09-03 DIAGNOSIS — Z881 Allergy status to other antibiotic agents status: Secondary | ICD-10-CM

## 2020-09-03 DIAGNOSIS — Z8719 Personal history of other diseases of the digestive system: Secondary | ICD-10-CM

## 2020-09-03 DIAGNOSIS — R0602 Shortness of breath: Secondary | ICD-10-CM | POA: Diagnosis not present

## 2020-09-03 DIAGNOSIS — I10 Essential (primary) hypertension: Secondary | ICD-10-CM | POA: Diagnosis not present

## 2020-09-03 DIAGNOSIS — R0902 Hypoxemia: Secondary | ICD-10-CM | POA: Diagnosis not present

## 2020-09-03 DIAGNOSIS — Z8616 Personal history of COVID-19: Secondary | ICD-10-CM

## 2020-09-03 DIAGNOSIS — Z807 Family history of other malignant neoplasms of lymphoid, hematopoietic and related tissues: Secondary | ICD-10-CM

## 2020-09-03 DIAGNOSIS — F419 Anxiety disorder, unspecified: Secondary | ICD-10-CM | POA: Diagnosis present

## 2020-09-03 DIAGNOSIS — R197 Diarrhea, unspecified: Secondary | ICD-10-CM | POA: Diagnosis not present

## 2020-09-03 DIAGNOSIS — Z8261 Family history of arthritis: Secondary | ICD-10-CM

## 2020-09-03 DIAGNOSIS — G43909 Migraine, unspecified, not intractable, without status migrainosus: Secondary | ICD-10-CM | POA: Diagnosis present

## 2020-09-03 DIAGNOSIS — K219 Gastro-esophageal reflux disease without esophagitis: Secondary | ICD-10-CM | POA: Diagnosis present

## 2020-09-03 DIAGNOSIS — K52831 Collagenous colitis: Secondary | ICD-10-CM | POA: Diagnosis present

## 2020-09-03 DIAGNOSIS — Z79899 Other long term (current) drug therapy: Secondary | ICD-10-CM

## 2020-09-03 DIAGNOSIS — U071 COVID-19: Secondary | ICD-10-CM | POA: Diagnosis present

## 2020-09-03 DIAGNOSIS — R404 Transient alteration of awareness: Secondary | ICD-10-CM | POA: Diagnosis not present

## 2020-09-03 DIAGNOSIS — J189 Pneumonia, unspecified organism: Secondary | ICD-10-CM | POA: Diagnosis not present

## 2020-09-03 DIAGNOSIS — Z981 Arthrodesis status: Secondary | ICD-10-CM

## 2020-09-03 LAB — LACTIC ACID, PLASMA
Lactic Acid, Venous: 1.1 mmol/L (ref 0.5–1.9)
Lactic Acid, Venous: 1.5 mmol/L (ref 0.5–1.9)

## 2020-09-03 LAB — CBC WITH DIFFERENTIAL/PLATELET
Abs Immature Granulocytes: 0.03 10*3/uL (ref 0.00–0.07)
Basophils Absolute: 0 10*3/uL (ref 0.0–0.1)
Basophils Relative: 0 %
Eosinophils Absolute: 0 10*3/uL (ref 0.0–0.5)
Eosinophils Relative: 0 %
HCT: 38.7 % (ref 36.0–46.0)
Hemoglobin: 12.9 g/dL (ref 12.0–15.0)
Immature Granulocytes: 1 %
Lymphocytes Relative: 32 %
Lymphs Abs: 1 10*3/uL (ref 0.7–4.0)
MCH: 30.9 pg (ref 26.0–34.0)
MCHC: 33.3 g/dL (ref 30.0–36.0)
MCV: 92.6 fL (ref 80.0–100.0)
Monocytes Absolute: 0.2 10*3/uL (ref 0.1–1.0)
Monocytes Relative: 5 %
Neutro Abs: 2 10*3/uL (ref 1.7–7.7)
Neutrophils Relative %: 62 %
Platelets: 180 10*3/uL (ref 150–400)
RBC: 4.18 MIL/uL (ref 3.87–5.11)
RDW: 13.5 % (ref 11.5–15.5)
Smear Review: NORMAL
WBC: 3.2 10*3/uL — ABNORMAL LOW (ref 4.0–10.5)
nRBC: 0 % (ref 0.0–0.2)

## 2020-09-03 LAB — URINALYSIS, COMPLETE (UACMP) WITH MICROSCOPIC
Bilirubin Urine: NEGATIVE
Glucose, UA: NEGATIVE mg/dL
Ketones, ur: NEGATIVE mg/dL
Leukocytes,Ua: NEGATIVE
Nitrite: NEGATIVE
Protein, ur: 100 mg/dL — AB
Specific Gravity, Urine: 1.02 (ref 1.005–1.030)
pH: 5 (ref 5.0–8.0)

## 2020-09-03 LAB — COMPREHENSIVE METABOLIC PANEL
ALT: 45 U/L — ABNORMAL HIGH (ref 0–44)
AST: 201 U/L — ABNORMAL HIGH (ref 15–41)
Albumin: 2.9 g/dL — ABNORMAL LOW (ref 3.5–5.0)
Alkaline Phosphatase: 52 U/L (ref 38–126)
Anion gap: 11 (ref 5–15)
BUN: 61 mg/dL — ABNORMAL HIGH (ref 8–23)
CO2: 25 mmol/L (ref 22–32)
Calcium: 7.2 mg/dL — ABNORMAL LOW (ref 8.9–10.3)
Chloride: 107 mmol/L (ref 98–111)
Creatinine, Ser: 2.97 mg/dL — ABNORMAL HIGH (ref 0.44–1.00)
GFR, Estimated: 17 mL/min — ABNORMAL LOW (ref >60)
Glucose, Bld: 76 mg/dL (ref 70–99)
Potassium: 2.7 mmol/L — CL (ref 3.5–5.1)
Sodium: 143 mmol/L (ref 135–145)
Total Bilirubin: 0.5 mg/dL (ref 0.3–1.2)
Total Protein: 6 g/dL — ABNORMAL LOW (ref 6.5–8.1)

## 2020-09-03 LAB — MAGNESIUM: Magnesium: 1.7 mg/dL (ref 1.7–2.4)

## 2020-09-03 LAB — RESP PANEL BY RT-PCR (FLU A&B, COVID) ARPGX2
Influenza A by PCR: NEGATIVE
Influenza B by PCR: NEGATIVE
SARS Coronavirus 2 by RT PCR: POSITIVE — AB

## 2020-09-03 LAB — PROCALCITONIN: Procalcitonin: 0.24 ng/mL

## 2020-09-03 LAB — CK: Total CK: 4866 U/L — ABNORMAL HIGH (ref 38–234)

## 2020-09-03 LAB — HIV ANTIBODY (ROUTINE TESTING W REFLEX): HIV Screen 4th Generation wRfx: NONREACTIVE

## 2020-09-03 MED ORDER — ENOXAPARIN SODIUM 40 MG/0.4ML ~~LOC~~ SOLN
40.0000 mg | SUBCUTANEOUS | Status: DC
  Administered 2020-09-03 – 2020-09-06 (×4): 40 mg via SUBCUTANEOUS
  Filled 2020-09-03 (×4): qty 0.4

## 2020-09-03 MED ORDER — PREDNISONE 50 MG PO TABS
50.0000 mg | ORAL_TABLET | Freq: Every day | ORAL | Status: DC
  Administered 2020-09-06 – 2020-09-07 (×2): 50 mg via ORAL
  Filled 2020-09-03: qty 1

## 2020-09-03 MED ORDER — CALCIUM CARBONATE-VITAMIN D 500-200 MG-UNIT PO TABS
1.0000 | ORAL_TABLET | Freq: Two times a day (BID) | ORAL | Status: DC
  Administered 2020-09-03 – 2020-09-07 (×9): 1 via ORAL
  Filled 2020-09-03 (×10): qty 1

## 2020-09-03 MED ORDER — ADULT MULTIVITAMIN W/MINERALS CH
1.0000 | ORAL_TABLET | Freq: Every day | ORAL | Status: DC
  Administered 2020-09-03 – 2020-09-07 (×4): 1 via ORAL
  Filled 2020-09-03 (×5): qty 1

## 2020-09-03 MED ORDER — ALBUTEROL SULFATE HFA 108 (90 BASE) MCG/ACT IN AERS
2.0000 | INHALATION_SPRAY | RESPIRATORY_TRACT | Status: DC | PRN
  Filled 2020-09-03: qty 6.7

## 2020-09-03 MED ORDER — DIAZEPAM 5 MG PO TABS: 5.0000 mg | ORAL_TABLET | Freq: Three times a day (TID) | ORAL | Status: DC | PRN

## 2020-09-03 MED ORDER — DEXAMETHASONE SODIUM PHOSPHATE 10 MG/ML IJ SOLN
10.0000 mg | Freq: Once | INTRAMUSCULAR | Status: AC
  Administered 2020-09-03: 10 mg via INTRAVENOUS
  Filled 2020-09-03: qty 1

## 2020-09-03 MED ORDER — LACTATED RINGERS IV BOLUS
1000.0000 mL | Freq: Once | INTRAVENOUS | Status: AC
  Administered 2020-09-03: 1000 mL via INTRAVENOUS

## 2020-09-03 MED ORDER — ONDANSETRON HCL 4 MG PO TABS: 4.0000 mg | ORAL_TABLET | Freq: Four times a day (QID) | ORAL | Status: DC | PRN

## 2020-09-03 MED ORDER — OXYCODONE ER 36 MG PO C12A: 1.0000 | EXTENDED_RELEASE_CAPSULE | Freq: Two times a day (BID) | ORAL | Status: DC

## 2020-09-03 MED ORDER — LEVOTHYROXINE SODIUM 137 MCG PO CAPS: 1.0000 | ORAL_CAPSULE | Freq: Every day | ORAL | Status: DC

## 2020-09-03 MED ORDER — SODIUM CHLORIDE 0.9 % IV SOLN
100.0000 mg | Freq: Every day | INTRAVENOUS | Status: AC
  Administered 2020-09-04 – 2020-09-07 (×4): 100 mg via INTRAVENOUS
  Filled 2020-09-03 (×4): qty 20

## 2020-09-03 MED ORDER — LEVOTHYROXINE SODIUM 137 MCG PO TABS
137.0000 ug | ORAL_TABLET | Freq: Every day | ORAL | Status: DC
  Administered 2020-09-03 – 2020-09-07 (×5): 137 ug via ORAL
  Filled 2020-09-03 (×7): qty 1

## 2020-09-03 MED ORDER — PANTOPRAZOLE SODIUM 40 MG PO TBEC
40.0000 mg | DELAYED_RELEASE_TABLET | Freq: Every day | ORAL | Status: DC
  Administered 2020-09-03 – 2020-09-07 (×4): 40 mg via ORAL
  Filled 2020-09-03 (×5): qty 1

## 2020-09-03 MED ORDER — POTASSIUM CHLORIDE CRYS ER 20 MEQ PO TBCR
40.0000 meq | EXTENDED_RELEASE_TABLET | Freq: Once | ORAL | Status: AC
  Administered 2020-09-03: 40 meq via ORAL
  Filled 2020-09-03: qty 2

## 2020-09-03 MED ORDER — ZOLPIDEM TARTRATE 5 MG PO TABS
5.0000 mg | ORAL_TABLET | Freq: Every evening | ORAL | Status: DC | PRN
  Administered 2020-09-03 – 2020-09-06 (×4): 5 mg via ORAL
  Filled 2020-09-03 (×4): qty 1

## 2020-09-03 MED ORDER — SODIUM CHLORIDE 0.9% FLUSH
3.0000 mL | Freq: Two times a day (BID) | INTRAVENOUS | Status: DC
  Administered 2020-09-03 – 2020-09-07 (×8): 3 mL via INTRAVENOUS

## 2020-09-03 MED ORDER — ASCORBIC ACID 500 MG PO TABS
500.0000 mg | ORAL_TABLET | Freq: Every day | ORAL | Status: DC
  Administered 2020-09-03 – 2020-09-07 (×5): 500 mg via ORAL
  Filled 2020-09-03 (×5): qty 1

## 2020-09-03 MED ORDER — OXYCODONE HCL 5 MG PO TABS
10.0000 mg | ORAL_TABLET | Freq: Three times a day (TID) | ORAL | Status: DC | PRN
  Administered 2020-09-03 – 2020-09-06 (×4): 10 mg via ORAL
  Filled 2020-09-03 (×3): qty 2

## 2020-09-03 MED ORDER — SODIUM CHLORIDE 0.9 % IV SOLN: 100.0000 mg | Freq: Every day | INTRAVENOUS | Status: DC

## 2020-09-03 MED ORDER — UMECLIDINIUM BROMIDE 62.5 MCG/INH IN AEPB
1.0000 | INHALATION_SPRAY | Freq: Every day | RESPIRATORY_TRACT | Status: DC
  Administered 2020-09-03 – 2020-09-07 (×5): 1 via RESPIRATORY_TRACT
  Filled 2020-09-03: qty 7

## 2020-09-03 MED ORDER — OMEGA-3-ACID ETHYL ESTERS 1 G PO CAPS
1.0000 g | ORAL_CAPSULE | Freq: Every day | ORAL | Status: DC
  Administered 2020-09-03 – 2020-09-07 (×4): 1 g via ORAL
  Filled 2020-09-03 (×5): qty 1

## 2020-09-03 MED ORDER — POTASSIUM CHLORIDE CRYS ER 20 MEQ PO TBCR
40.0000 meq | EXTENDED_RELEASE_TABLET | Freq: Two times a day (BID) | ORAL | Status: AC
  Administered 2020-09-03 (×2): 40 meq via ORAL
  Filled 2020-09-03 (×2): qty 2

## 2020-09-03 MED ORDER — FLUTICASONE FUROATE-VILANTEROL 100-25 MCG/INH IN AEPB
1.0000 | INHALATION_SPRAY | Freq: Every day | RESPIRATORY_TRACT | Status: DC
  Administered 2020-09-03 – 2020-09-07 (×5): 1 via RESPIRATORY_TRACT
  Filled 2020-09-03: qty 28

## 2020-09-03 MED ORDER — GUAIFENESIN-DM 100-10 MG/5ML PO SYRP: 10.0000 mL | ORAL_SOLUTION | ORAL | Status: DC | PRN

## 2020-09-03 MED ORDER — METHYLPREDNISOLONE SODIUM SUCC 125 MG IJ SOLR
1.0000 mg/kg | Freq: Two times a day (BID) | INTRAMUSCULAR | Status: AC
  Administered 2020-09-03 – 2020-09-06 (×6): 93.75 mg via INTRAVENOUS
  Filled 2020-09-03 (×5): qty 2

## 2020-09-03 MED ORDER — ONDANSETRON HCL 4 MG/2ML IJ SOLN
4.0000 mg | Freq: Four times a day (QID) | INTRAMUSCULAR | Status: DC | PRN
  Administered 2020-09-05: 4 mg via INTRAVENOUS
  Filled 2020-09-03 (×2): qty 2

## 2020-09-03 MED ORDER — SODIUM CHLORIDE 0.9 % IV SOLN
200.0000 mg | Freq: Once | INTRAVENOUS | Status: AC
  Administered 2020-09-03: 200 mg via INTRAVENOUS
  Filled 2020-09-03: qty 200

## 2020-09-03 MED ORDER — QUETIAPINE FUMARATE 25 MG PO TABS
50.0000 mg | ORAL_TABLET | Freq: Every day | ORAL | Status: DC
  Administered 2020-09-03 – 2020-09-06 (×4): 50 mg via ORAL
  Filled 2020-09-03 (×4): qty 2

## 2020-09-03 MED ORDER — ACETAMINOPHEN 325 MG PO TABS
650.0000 mg | ORAL_TABLET | Freq: Four times a day (QID) | ORAL | Status: DC | PRN
  Administered 2020-09-03: 650 mg via ORAL
  Filled 2020-09-03: qty 2

## 2020-09-03 MED ORDER — PRAVASTATIN SODIUM 10 MG PO TABS: 10.0000 mg | ORAL_TABLET | Freq: Every day | ORAL | Status: DC

## 2020-09-03 MED ORDER — VENLAFAXINE HCL ER 75 MG PO CP24
75.0000 mg | ORAL_CAPSULE | Freq: Every day | ORAL | Status: DC
  Administered 2020-09-04 – 2020-09-07 (×4): 75 mg via ORAL
  Filled 2020-09-03 (×5): qty 1

## 2020-09-03 MED ORDER — SODIUM CHLORIDE 0.9 % IV SOLN: 250.0000 mL | INTRAVENOUS | Status: DC | PRN

## 2020-09-03 MED ORDER — ZINC SULFATE 220 (50 ZN) MG PO CAPS
220.0000 mg | ORAL_CAPSULE | Freq: Every day | ORAL | Status: DC
  Administered 2020-09-03 – 2020-09-07 (×5): 220 mg via ORAL
  Filled 2020-09-03 (×5): qty 1

## 2020-09-03 MED ORDER — SODIUM CHLORIDE 0.9% FLUSH
3.0000 mL | INTRAVENOUS | Status: DC | PRN
Start: 2020-09-03 — End: 2020-09-07

## 2020-09-03 MED ORDER — OXYCODONE HCL ER 10 MG PO T12A
20.0000 mg | EXTENDED_RELEASE_TABLET | Freq: Two times a day (BID) | ORAL | Status: DC
  Administered 2020-09-03 – 2020-09-05 (×6): 20 mg via ORAL
  Filled 2020-09-03 (×7): qty 2

## 2020-09-03 MED ORDER — FLUTICASONE-UMECLIDIN-VILANT 100-62.5-25 MCG/INH IN AEPB
1.0000 | INHALATION_SPRAY | Freq: Every day | RESPIRATORY_TRACT | Status: DC
  Administered 2020-09-03: 1 via RESPIRATORY_TRACT

## 2020-09-03 MED ORDER — SODIUM CHLORIDE 0.9 % IV SOLN: 200.0000 mg | Freq: Once | INTRAVENOUS | Status: DC

## 2020-09-03 NOTE — ED Provider Notes (Signed)
[facility] Emergency Department Provider Note  ____________________________________________   I have reviewed the triage vital signs and the nursing notes.   HISTORY  Chief Complaint Altered Mental Status, Hypotension, and Shortness of Breath   History limited by and level 5 caveat due to: confusion   HPI [name] is a 60 y.o. female who presents to the emergency department today because of concerns for altered mental status and shortness of breath.  Patient is apparently coming from home.  Patient does state that she has been sick for a couple of weeks.  She says that she did test positive for COVID during this illness.  She feels like she is just not been getting any better.  Apparently today the patient was more concerned.  When EMS arrived patient was found to be hypoxic.  She was placed on oxygen and duoneb treatments with good response.  Records reviewed. Per medical record review patient has a history of asthma  Past Medical History:  Diagnosis Date  . Allergic rhinitis   . Anxiety   . Arthralgia   . Asthma   . Bronchitis, chronic ([place])   . Chronic back pain   . Collagenous colitis    followed by Dr Tiffany Kocher  . Depression   . Diplopia 11/15/2013  . Dysphagia   . Endometriosis   . Fibrocystic breast disease   . GERD (gastroesophageal reflux disease)   . History of colon polyps   . Hyperlipidemia   . Hypertension   . Hypokalemia   . IBS (irritable bowel syndrome)   . Memory difficulty 11/15/2013  . Migraine headache   . Pneumonia 2017   x2  . PONV (postoperative nausea and vomiting)    used patch  . Sleep apnea    recommended CPAP  . Ulcer disease   . Urine incontinence     Patient Active Problem List   Diagnosis Date Noted  . Bereavement 02/05/2020  . MDD (major depressive disorder), recurrent, in partial remission ([place]) 10/29/2019  . MDD (major depressive disorder), recurrent episode, moderate ([place]) 06/12/2019  . Breast pain  01/14/2018  . Abdominal pain 01/14/2018  . Hot flashes 01/25/2017  . Chest pain on exertion 12/15/2016  . Chest tightness 11/25/2016  . SOB (shortness of breath) 11/25/2016  . 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 Chronic thyroiditis 06/15/2016  . Anxiety state 09/03/2015  . Ache in joint 09/03/2015  . Benign hypertension 09/03/2015  . Endometriosis 09/03/2015  . History of migraine headaches 09/03/2015  . H/O adenomatous polyp of colon 09/03/2015  . Adaptive colitis 09/03/2015  . Apnea, sleep 09/03/2015  . Health care maintenance 08/06/2015  . History of colonic polyps 06/30/2015  . Rash 05/05/2015  . 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 Memory difficulty 11/15/2013  . Diplopia 11/15/2013  . 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 Incomplete bladder emptying 10/31/2013  . Depression 12/03/2012  . Essential hypertension, benign 12/03/2012  . Hypercholesterolemia 12/03/2012  . Obstructive sleep apnea 12/03/2012  . Collagenous colitis 12/03/2012  . Dermatomyositis ([place]) 11/12/2009  . ALLERGIC RHINITIS 05/25/2007  . Asthma 05/25/2007  . GERD 05/25/2007  . Back pain 05/25/2007  . HEADACHE 05/25/2007    Past Surgical History:  Procedure Laterality Date  . ABDOMINAL HYSTERECTOMY     endometriosis  . BACK SURGERY     x6  . BREAST BIOPSY Right   . COLONOSCOPY WITH PROPOFOL N/A 06/25/2015   Procedure: COLONOSCOPY WITH PROPOFOL;  Surgeon: Manya Silvas, MD;  Location: [facility]  ENDOSCOPY;  Service: Endoscopy;  Laterality: N/A;  . DILATION AND CURETTAGE OF UTERUS     and hysteroscopy  . ESOPHAGOGASTRODUODENOSCOPY N/A 06/25/2015   Procedure: ESOPHAGOGASTRODUODENOSCOPY (EGD);  Surgeon: Manya Silvas, MD;  Location: [facility] ENDOSCOPY;  Service: Endoscopy;  Laterality: N/A;  . insertion of permanent spinal cord stimulator    . insertion of trial spinal cord stimulator    . LUMBAR DISC SURGERY    . LUMBAR FUSION  4/05 and 8/07  . LUMBAR MICRODISCECTOMY  03/28/03   L4-5 L5   . MOUTH SURGERY    . NASAL SEPTOPLASTY W/ TURBINOPLASTY    . removal of spinal cord stimulator    . SEPTOPLASTY     with reduction of turbinates  . THYROIDECTOMY N/A 06/06/2016   Procedure: THYROIDECTOMY;  Surgeon: Robert Bellow, MD;  Location: [facility] ORS;  Service: General;  Laterality: N/A;    Prior to Admission medications   Medication Sig Start Date End Date Taking? Authorizing Provider  albuterol (PROVENTIL) (2.5 MG/3ML) 0.083% nebulizer solution Take 2.5 mg by nebulization every 4 (four) hours as needed for wheezing or shortness of breath.    [provider]  albuterol (VENTOLIN HFA) 108 (90 Base) MCG/ACT inhaler Inhale 2 puffs into the lungs every 4 (four) hours as needed for wheezing or shortness of breath. 01/14/19   Wilhelmina Mcardle, MD  amLODipine (NORVASC) 5 MG tablet TAKE 1 TABLET BY MOUTH EVERY DAY 06/15/20   Einar Pheasant, MD  bisacodyl (DULCOLAX) 5 MG EC tablet Take by mouth.    [provider]  diazepam (VALIUM) 5 MG tablet Take 5 mg by mouth 3 (three) times daily as needed.      [provider]  docusate sodium (COLACE) 100 MG capsule Take 100 mg by mouth 2 (two) times daily.    [provider]  Fluticasone-Umeclidin-Vilant (TRELEGY ELLIPTA) 100-62.5-25 MCG/INH AEPB Inhale 1 puff into the lungs daily. 08/13/19   Tyler Pita, MD  GRALISE 600 MG TABS  12/22/17   [provider]  hydrochlorothiazide (HYDRODIURIL) 25 MG tablet Take 25 mg by mouth daily. 12/12/16   [provider]  metoprolol tartrate (LOPRESSOR) 50 MG tablet TAKE 1/2 TABLET BY MOUTH TWICE A DAY (NEED OFFICE VISIT) 07/13/20   Einar Pheasant, MD  Multiple Vitamin (MULTIVITAMIN) capsule Take 1 capsule by mouth daily.      [provider]  Omega-3 Fatty Acids (FISH OIL) 1200 MG CAPS Take 1 capsule by mouth daily.    [provider]  omeprazole (PRILOSEC) 40 MG capsule TAKE 1 CAPSULE (40 MG TOTAL) BY MOUTH AT BEDTIME. 07/13/20 07/13/21   Tyler Pita, MD  OS-CAL CALCIUM + D3 500-200 MG-UNIT TABS TAKE 2 TABLETS BY MOUTH 2 (TWO) TIMES DAILY. 01/23/17   [provider]  Oxycodone HCl 10 MG TABS Take 10 mg by mouth 3 (three) times daily as needed. 04/02/15   [provider]  potassium chloride (K-DUR) 10 MEQ tablet TAKE 1 TABLET BY MOUTH TWICE A DAY 03/18/19   Einar Pheasant, MD  pravastatin (PRAVACHOL) 10 MG tablet TAKE 1 TABLET (10 MG TOTAL) BY MOUTH DAILY. 06/07/20   Einar Pheasant, MD  QUEtiapine (SEROQUEL) 50 MG tablet Take 1 tablet (50 mg total) by mouth at bedtime. 08/17/20   [place]evada Crane, MD  TIROSINT 137 MCG CAPS TAKE 1 CAPSULE BY MOUTH DAILY BEFORE BREAKFAST. 07/13/20   Einar Pheasant, MD  venlafaxine XR (EFFEXOR-XR) 75 MG 24 hr capsule Take 3 capsules in the morning 07/27/20  [place]evada Crane, MD  XTAMPZA ER 36 MG C12A TAKE 1 CAPSULE BY MOUTH EVERY 12 HOURS 12/25/15   [provider]  zolpidem (AMBIEN) 10 MG tablet Take 10 mg by mouth at bedtime as needed.      [provider]  triamcinolone (NASACORT AQ) 55 MCG/ACT AERO nasal inhaler Place 2 sprays into the nose 2 (two) times daily. 1 sprays each nostril twice a day 06/02/16 06/02/16  Robert Bellow, MD    Allergies Fentanyl, Sumatriptan, Doxycycline, Montelukast, Oxymorphone, Benzoin, Buspirone, Ciprofloxacin, Conjugated estrogens, Cymbalta [duloxetine hcl], Duloxetine, Erythromycin ethylsuccinate, Estradiol, Metronidazole, and Mirtazapine  Family History  Problem Relation Age of Onset  . Allergies Mother   . Hypertension Mother   . Arthritis Mother   . Hyperlipidemia Mother   . Stroke Mother   . Hypertension Father   . Arthritis Father   . Diabetes Father   . Hypertension Brother   . Diabetes Brother   . Arthritis Maternal Grandmother   . Hyperlipidemia Maternal Grandmother   . Hyperlipidemia Maternal Grandfather   . Arthritis Paternal Grandmother   . Arthritis Paternal Grandfather   . Asthma Other        several  family member  . Leukemia Other        uncle  . Lymphoma Maternal Aunt        non hodgkins    Social History Social History   Tobacco Use  . Smoking status: Never Smoker  . Smokeless tobacco: Never Used  Vaping Use  . Vaping Use: Never used  Substance Use Topics  . Alcohol use: No    Alcohol/week: 0.0 standard drinks  . Drug use: No    Review of Systems Constitutional: No fever/chills Eyes: No visual changes. ENT: No sore throat. Cardiovascular: Denies chest pain. Respiratory: Positive for shortness of breath. Gastrointestinal: No abdominal pain.  No nausea, no vomiting.  No diarrhea.   Genitourinary: Negative for dysuria. Musculoskeletal: Negative for back pain. Skin: Negative for rash. Neurological: Positive for confusion.  ____________________________________________   PHYSICAL EXAM:  VITAL SIGNS: ED Triage Vitals  Enc Vitals Group     BP 09/03/20 0320 (!) 80/52     Pulse Rate 09/03/20 0320 65     Resp 09/03/20 0320 16     Temp 09/03/20 0320 98 F (36.7 C)     Temp Source 09/03/20 0320 Oral     SpO2 09/03/20 0320 94 %     Weight 09/03/20 0321 207 lb (93.9 kg)     Height 09/03/20 0321 5\' 7"  (1.702 m)     Head Circumference --      Peak Flow --      Pain Score 09/03/20 0321 0   Constitutional: Alert and not completely oriented. Eyes: Conjunctivae are normal.  ENT      Head: Normocephalic and atraumatic.      Nose: No congestion/rhinnorhea.      Mouth/Throat: Mucous membranes are moist.      Neck: No stridor. Hematological/Lymphatic/Immunilogical: No cervical lymphadenopathy. Cardiovascular: Normal rate, regular rhythm.  No murmurs, rubs, or gallops.  Respiratory: Slightly increased respiratory effort. Diffuse wheezing with some rhonchi in lower lungs.  Gastrointestinal: Soft and non tender. No rebound. No guarding.  Genitourinary: Deferred Musculoskeletal: Normal range of motion in all extremities. No lower extremity edema. Neurologic:  Normal speech  and language. No gross focal neurologic deficits are appreciated.  Skin:  Skin is warm, dry and intact. No rash noted. Psychiatric: Mood and affect are normal. Speech and behavior  are normal. Patient exhibits appropriate insight and judgment.  ____________________________________________    LABS (pertinent positives/negatives)  CBC wbc 3.2, hgb 12.9, plt 180 CMP na 143, k 2.7, cr 2.97 Lactic acid 1.1 COVID positive ____________________________________________   EKG  I, Nance Pear, attending physician, personally viewed and interpreted this EKG  EKG Time: 0315 Rate: 67 Rhythm: sinus rhythm Axis: normal Intervals: qtc 486 QRS: narrow, low voltage ST changes: no st elevaiton Impression: abnormal ekg  ____________________________________________    RADIOLOGY  CXR Consistent with multifocal pneumonia  ____________________________________________   PROCEDURES  Procedures  ____________________________________________   INITIAL IMPRESSION / ASSESSMENT AND PLAN / ED COURSE  Pertinent labs & imaging results that were available during my care of the patient were reviewed by me and considered in my medical decision making (see chart for details).   Patient presents to the emergency department today because of concerns for some altered mental status as well as shortness of breath.  Patient states she was diagnosed with COVID roughly 1 week ago and symptoms have been going on slightly longer.  Patient was hypoxic for EMS.  Additionally patient was slightly hypotensive.  Blood work here is concerning for likely AKI and hypokalemia as well as testing positive for COVID.  Patient was given IV fluids and potassium and will start remdesivir Decadron and plan on admission.   ____________________________________________   FINAL CLINICAL IMPRESSION(S) / ED DIAGNOSES  Final diagnoses:  Dehydration  AKI (acute kidney injury) ([place])  COVID-19  Hypoxia     Note: This  dictation was prepared with Dragon dictation. Any transcriptional errors that result from this process are unintentional     Nance Pear, MD 09/03/20 [phone]

## 2020-09-03 NOTE — Progress Notes (Signed)
Remdesivir - Pharmacy Brief Note   A/P:  Remdesivir 200 mg IVPB once followed by 100 mg IVPB daily x 4 days.   Rahsaan Weakland, PharmD Clinical Pharmacist  

## 2020-09-03 NOTE — ED Triage Notes (Signed)
Pt arrives to ED from home via [facility] with c/c of altered mental status, hypotension, and SoB. EMS reports initial vitals of 70/45,, O2 sat 82% on room air. Pt given 2 duonebs enroute. Vitals during transport 90/40, p60, R 20, O2 sat 98% on 4L via nasal canula. Upon arrival, pt A&Ox4, NAD.

## 2020-09-03 NOTE — H&P (Addendum)
History and Physical    [name] MVH:[id number] DOB: Jul 18, 1961 DOA: 09/03/2020  PCP: Einar Pheasant, MD   Patient coming from: Home  I have personally briefly reviewed patient's old medical records in [place]  Chief Complaint: Change in mental status Most of the patient's history was obtained from her husband over the phone   HPI: [name] is a 60 y.o. female with medical history significant for severe depression, chronic low back pain, hypothyroidism and hypertension who presents to the emergency room via EMS for evaluation of mental status changes, shortness of breath and hypoxia. Patient's husband who provides most of the history states that she has been sick for about 2 weeks and was diagnosed with COVID-19 viral infection on 08/21/20.  She is unvaccinated.  He states that she initially had fever and chills and responded to acetaminophen but over the last couple of days she has had shortness of breath.  She checked her pulse oximetry at home and it was in the high 70s.  He called EMS and patient was noted to have room air pulse oximetry of 82% and blood pressure 70/45.  She was placed on 4 L of oxygen by nasal cannula with improvement in her pulse oximetry to 98%. Patient states that she has had poor oral intake, nausea, vomiting, diarrhea, cough, fever and chills as well as generalized weakness. She denies having any abdominal pain, no urinary frequency, no nocturia, no dysuria, no palpitations, no diaphoresis, no headaches. Labs show sodium 143, potassium 2.7, chloride 107, bicarb 25, glucose 76, BUN 61, creatinine 2.97, calcium 7.2, alkaline phosphatase 52, albumin 2.9, AST 201, ALT 45, total protein 6.0, lactic acid 1.1, procalcitonin 0.24, white count 3.2, hemoglobin 12.9, hematocrit 38.7, MCV 92.6, RDW 13.5, platelet count 180 SARS coronavirus 2 PCR test is positive Chest x-ray reviewed by me shows multifocal patchy airspace opacities left greater than right  consistent with multifocal pneumonia. Twelve-lead EKG reviewed by me shows sinus rhythm with diffuse T wave abnormality   ED Course: Patient is a 60 year old Caucasian female who was brought into the ER by EMS for evaluation of worsening shortness of breath and mental status changes.  Patient tested positive for COVID-19 virus on August 21, 2020.  She was noted to be hypotensive in the field and required IV fluid resuscitation to improve her blood pressure.  Room air pulse oximetry was 82% and this improved to 98% on 4 L of oxygen.  She received Decadron in the ER and a dose of remdesivir and will be admitted to the hospital for further evaluation.  Review of Systems: As per HPI otherwise all systems reviewed and negative.    Past Medical History:  Diagnosis Date  . Allergic rhinitis   . Anxiety   . Arthralgia   . Asthma   . Bronchitis, chronic ([place])   . Chronic back pain   . Collagenous colitis    followed by Dr Tiffany Kocher  . Depression   . Diplopia 11/15/2013  . Dysphagia   . Endometriosis   . Fibrocystic breast disease   . GERD (gastroesophageal reflux disease)   . History of colon polyps   . Hyperlipidemia   . Hypertension   . Hypokalemia   . IBS (irritable bowel syndrome)   . Memory difficulty 11/15/2013  . Migraine headache   . Pneumonia 2017   x2  . PONV (postoperative nausea and vomiting)    used patch  . Sleep apnea    recommended CPAP  .  Ulcer disease   . Urine incontinence     Past Surgical History:  Procedure Laterality Date  . ABDOMINAL HYSTERECTOMY     endometriosis  . BACK SURGERY     x6  . BREAST BIOPSY Right   . COLONOSCOPY WITH PROPOFOL N/A 06/25/2015   Procedure: COLONOSCOPY WITH PROPOFOL;  Surgeon: Manya Silvas, MD;  Location: [facility] ENDOSCOPY;  Service: Endoscopy;  Laterality: N/A;  . DILATION AND CURETTAGE OF UTERUS     and hysteroscopy  . ESOPHAGOGASTRODUODENOSCOPY N/A 06/25/2015   Procedure: ESOPHAGOGASTRODUODENOSCOPY (EGD);  Surgeon: Manya Silvas, MD;  Location: [facility] ENDOSCOPY;  Service: Endoscopy;  Laterality: N/A;  . insertion of permanent spinal cord stimulator    . insertion of trial spinal cord stimulator    . LUMBAR DISC SURGERY    . LUMBAR FUSION  4/05 and 8/07  . LUMBAR MICRODISCECTOMY  03/28/03   L4-5 L5  . MOUTH SURGERY    . NASAL SEPTOPLASTY W/ TURBINOPLASTY    . removal of spinal cord stimulator    . SEPTOPLASTY     with reduction of turbinates  . THYROIDECTOMY N/A 06/06/2016   Procedure: THYROIDECTOMY;  Surgeon: Robert Bellow, MD;  Location: [facility] ORS;  Service: General;  Laterality: N/A;     reports that she has never smoked. She has never used smokeless tobacco. She reports that she does not drink alcohol and does not use drugs.  Allergies  Allergen Reactions  . Fentanyl Other (See Comments)    Other reaction(s): Headache, Other (See Comments) headache SEVERE HEADACHE headache  . Sumatriptan Other (See Comments)    Other reaction(s): Headache Severe headache  . Doxycycline Other (See Comments)    Other reaction(s): Headache, Other (See Comments) REACTION: causes severe abd pain REACTION: causes severe abd pain REACTION: causes severe abd pain  . Montelukast Other (See Comments)    SEVERE HEADACHE  . Oxymorphone Other (See Comments)    REACTION: severe headaches  . Benzoin Rash  . Buspirone Other (See Comments)    SEVERE HEADACHE  . Ciprofloxacin Nausea Only  . Conjugated Estrogens Other (See Comments)    headaches  . Cymbalta [Duloxetine Hcl] Other (See Comments)    Headache  . Duloxetine Other (See Comments)    Headache  . Erythromycin Ethylsuccinate Other (See Comments)    Stomach ache  . Estradiol Other (See Comments)    headache  . Metronidazole Other (See Comments)    abd pain  . Mirtazapine Other (See Comments)    headache    Family History  Problem Relation Age of Onset  . Allergies Mother   . Hypertension Mother   . Arthritis Mother   . Hyperlipidemia Mother   .  Stroke Mother   . Hypertension Father   . Arthritis Father   . Diabetes Father   . Hypertension Brother   . Diabetes Brother   . Arthritis Maternal Grandmother   . Hyperlipidemia Maternal Grandmother   . Hyperlipidemia Maternal Grandfather   . Arthritis Paternal Grandmother   . Arthritis Paternal Grandfather   . Asthma Other        several family member  . Leukemia Other        uncle  . Lymphoma Maternal Aunt        non hodgkins     Prior to Admission medications   Medication Sig Start Date End Date Taking? Authorizing Provider  albuterol (PROVENTIL) (2.5 MG/3ML) 0.083% nebulizer solution Take 2.5 mg by nebulization every 4 (four) hours as  needed for wheezing or shortness of breath.    [provider]  albuterol (VENTOLIN HFA) 108 (90 Base) MCG/ACT inhaler Inhale 2 puffs into the lungs every 4 (four) hours as needed for wheezing or shortness of breath. 01/14/19   Wilhelmina Mcardle, MD  amLODipine (NORVASC) 5 MG tablet TAKE 1 TABLET BY MOUTH EVERY DAY 06/15/20   Einar Pheasant, MD  bisacodyl (DULCOLAX) 5 MG EC tablet Take by mouth.    [provider]  diazepam (VALIUM) 5 MG tablet Take 5 mg by mouth 3 (three) times daily as needed.      [provider]  docusate sodium (COLACE) 100 MG capsule Take 100 mg by mouth 2 (two) times daily.    [provider]  Fluticasone-Umeclidin-Vilant (TRELEGY ELLIPTA) 100-62.5-25 MCG/INH AEPB Inhale 1 puff into the lungs daily. 08/13/19   Tyler Pita, MD  GRALISE 600 MG TABS  12/22/17   [provider]  hydrochlorothiazide (HYDRODIURIL) 25 MG tablet Take 25 mg by mouth daily. 12/12/16   [provider]  metoprolol tartrate (LOPRESSOR) 50 MG tablet TAKE 1/2 TABLET BY MOUTH TWICE A DAY (NEED OFFICE VISIT) 07/13/20   Einar Pheasant, MD  Multiple Vitamin (MULTIVITAMIN) capsule Take 1 capsule by mouth daily.      [provider]  Omega-3 Fatty Acids (FISH OIL) 1200 MG CAPS Take 1 capsule by  mouth daily.    [provider]  omeprazole (PRILOSEC) 40 MG capsule TAKE 1 CAPSULE (40 MG TOTAL) BY MOUTH AT BEDTIME. 07/13/20 07/13/21  Tyler Pita, MD  OS-CAL CALCIUM + D3 500-200 MG-UNIT TABS TAKE 2 TABLETS BY MOUTH 2 (TWO) TIMES DAILY. 01/23/17   [provider]  Oxycodone HCl 10 MG TABS Take 10 mg by mouth 3 (three) times daily as needed. 04/02/15   [provider]  potassium chloride (K-DUR) 10 MEQ tablet TAKE 1 TABLET BY MOUTH TWICE A DAY 03/18/19   Einar Pheasant, MD  pravastatin (PRAVACHOL) 10 MG tablet TAKE 1 TABLET (10 MG TOTAL) BY MOUTH DAILY. 06/07/20   Einar Pheasant, MD  QUEtiapine (SEROQUEL) 50 MG tablet Take 1 tablet (50 mg total) by mouth at bedtime. 08/17/20   Nevada Crane, MD  TIROSINT 137 MCG CAPS TAKE 1 CAPSULE BY MOUTH DAILY BEFORE BREAKFAST. 07/13/20   Einar Pheasant, MD  venlafaxine XR (EFFEXOR-XR) 75 MG 24 hr capsule Take 3 capsules in the morning 07/27/20   [place] Crane, MD  XTAMPZA ER 36 MG C12A TAKE 1 CAPSULE BY MOUTH EVERY 12 HOURS 12/25/15   [provider]  zolpidem (AMBIEN) 10 MG tablet Take 10 mg by mouth at bedtime as needed.      [provider]  triamcinolone (NASACORT AQ) 55 MCG/ACT AERO nasal inhaler Place 2 sprays into the nose 2 (two) times daily. 1 sprays each nostril twice a day 06/02/16 06/02/16  Robert Bellow, MD    Physical Exam: Vitals:   09/03/20 0530 09/03/20 0600 09/03/20 0700 09/03/20 0730  BP: (!) 95/59 107/60 (!) 107/58 113/67  Pulse: 62 62 63 65  Resp: 19 20 18 18   Temp:      TempSrc:      SpO2: 93% 90% 93% 91%  Weight:      Height:         Vitals:   09/03/20 0530 09/03/20 0600 09/03/20 0700 09/03/20 0730  BP: (!) 95/59 107/60 (!) 107/58 113/67  Pulse: 62 62 63 65  Resp: 19 20 18 18   Temp:  TempSrc:      SpO2: 93% 90% 93% 91%  Weight:      Height:        Constitutional: NAD, alert and oriented x 2. to person and place.  Acutely ill-appearing Eyes: PERRL, lids and  conjunctivae pallor ENMT: Mucous membranes are moist.  Neck: normal, supple, no masses, no thyromegaly Respiratory: Bilateral air entry, no wheezing, no crackles. Normal respiratory effort. No accessory muscle use.  Cardiovascular: Regular rate and rhythm, no murmurs / rubs / gallops. No extremity edema. 2+ pedal pulses. No carotid bruits.  Abdomen: no tenderness, no masses palpated. No hepatosplenomegaly. Bowel sounds positive.  Musculoskeletal: no clubbing / cyanosis. No joint deformity upper and lower extremities.  Skin: no rashes, lesions, ulcers.  Neurologic: No gross focal neurologic deficit.  Generalized weakness Psychiatric: Flat affect   Labs on Admission: I have personally reviewed following labs and imaging studies  CBC: Recent Labs  Lab 09/03/20 0336  WBC 3.2*  NEUTROABS 2.0  HGB 12.9  HCT 38.7  MCV 92.6  PLT 99991111   Basic Metabolic Panel: Recent Labs  Lab 09/03/20 0336  NA 143  K 2.7*  CL 107  CO2 25  GLUCOSE 76  BUN 61*  CREATININE 2.97*  CALCIUM 7.2*   GFR: Estimated Creatinine Clearance: 23.4 mL/min (A) (by C-G formula based on SCr of 2.97 mg/dL (H)). Liver Function Tests: Recent Labs  Lab 09/03/20 0336  AST 201*  ALT 45*  ALKPHOS 52  BILITOT 0.5  PROT 6.0*  ALBUMIN 2.9*   No results for input(s): LIPASE, AMYLASE in the last 168 hours. No results for input(s): AMMONIA in the last 168 hours. Coagulation Profile: No results for input(s): INR, PROTIME in the last 168 hours. Cardiac Enzymes: No results for input(s): CKTOTAL, CKMB, CKMBINDEX, TROPONINI in the last 168 hours. BNP (last 3 results) No results for input(s): PROBNP in the last 8760 hours. HbA1C: No results for input(s): HGBA1C in the last 72 hours. CBG: No results for input(s): GLUCAP in the last 168 hours. Lipid Profile: No results for input(s): CHOL, HDL, LDLCALC, TRIG, CHOLHDL, LDLDIRECT in the last 72 hours. Thyroid Function Tests: No results for input(s): TSH, T4TOTAL,  FREET4, T3FREE, THYROIDAB in the last 72 hours. Anemia Panel: No results for input(s): VITAMINB12, FOLATE, FERRITIN, TIBC, IRON, RETICCTPCT in the last 72 hours. Urine analysis:    Component Value Date/Time   COLORURINE YELLOW 12/08/2016 [place] 12/08/2016 1048   LABSPEC 1.020 12/08/2016 1048   PHURINE 7.0 12/08/2016 1048   GLUCOSEU NEGATIVE 12/08/2016 1048   HGBUR NEGATIVE 12/08/2016 1048   BILIRUBINUR NEGATIVE 12/08/2016 1048   KETONESUR NEGATIVE 12/08/2016 1048   UROBILINOGEN 0.2 12/08/2016 1048   NITRITE NEGATIVE 12/08/2016 1048   LEUKOCYTESUR NEGATIVE 12/08/2016 1048    Radiological Exams on Admission: DG Chest Port 1 View  Result Date: 09/03/2020 CLINICAL DATA:  Altered mental status EXAM: PORTABLE CHEST 1 VIEW COMPARISON:  March 19, 2019 FINDINGS: The heart size and mediastinal contours are within normal limits. Patchy airspace opacities are seen throughout the left lung and the periphery of the right upper lung. No pleural effusion. There is stable elevation of the right hemidiaphragm. Overlying spinal stimulator leads are seen. The visualized skeletal structures are unremarkable. IMPRESSION: Multifocal patchy airspace opacities left greater than right which could be due to multifocal pneumonia Electronically Signed   By: Prudencio Pair M.D.   On: 09/03/2020 03:48    EKG: Independently reviewed.  Sinus rhythm with diffuse T wave  abnormalities  Assessment/Plan Principal Problem:   Pneumonia due to COVID-19 virus Active Problems:   GERD   Back pain   Depression   Benign hypertension   Hypokalemia   AKI (acute kidney injury) ([place])   Hypothyroidism   Transaminitis   Acute respiratory failure due to COVID-19 [facility])      Pneumonia due to COVID-19 virus with Acute Respiratory Failure  Patient is unvaccinated and presents to the ER for evaluation of shortness of breath and generalized weakness Her initial COVID-19 PCR test was positive on 08/21/20 Chest  x-ray shows multifocal pneumonia Patient had increased work of breathing on room air pulse oximetry of 82% that improved following oxygen supplementation at 4 L to greater than 92% Will place patient on remdesivir per protocol Start patient on Solu-Medrol 1 mg/kg body weight every 12 Continue oxygen supplementation to maintain pulse oximetry greater than 92% Supportive care with antitussives and bronchodilator therapy    Acute kidney injury At baseline patient has a serum creatinine of 1.1 today on admission serum creatinine is 2.97 Worsening renal function appears to be secondary to ATN from hypotension Hold all antihypertensive medications for now Obtain CK levels to rule out rhabdomyolysis Encourage oral fluid intake    Hypokalemia Most likely secondary to diuretic therapy as well as from GI losses due to nausea/vomiting and diarrhea Hold HCTZ Supplement potassium Check magnesium levels    Hypothyroidism Continue Synthroid    Anxiety and depression Continue venlafaxine, Seroquel and diazepam as needed    Transaminitis Patient noted to have elevated liver enzymes which may be secondary to hypotension Hold statins for now   GERD Continue omeprazole    Chronic debility from low back pain Patient has a history of chronic low back pain and is mostly bedbound per her husband Continue as needed oxycodone    DVT prophylaxis: Lovenox Code Status: Full code Family Communication: Greater than 50% of time was spent discussing patient's condition and plan of care with her husband over the phone.  All questions and concerns have been addressed.  He verbalizes understanding and agrees with the plan. Disposition Plan: Back to previous home environment Consults called: None    Ozzie Remmers MD [facility]     09/03/2020, 10:04 AM

## 2020-09-04 DIAGNOSIS — M545 Low back pain, unspecified: Secondary | ICD-10-CM

## 2020-09-04 DIAGNOSIS — N179 Acute kidney failure, unspecified: Secondary | ICD-10-CM | POA: Diagnosis not present

## 2020-09-04 DIAGNOSIS — J1282 Pneumonia due to coronavirus disease 2019: Secondary | ICD-10-CM | POA: Diagnosis not present

## 2020-09-04 DIAGNOSIS — U071 COVID-19: Secondary | ICD-10-CM | POA: Diagnosis not present

## 2020-09-04 LAB — CBC WITH DIFFERENTIAL/PLATELET
Abs Immature Granulocytes: 0.02 10*3/uL (ref 0.00–0.07)
Basophils Absolute: 0 10*3/uL (ref 0.0–0.1)
Basophils Relative: 0 %
Eosinophils Absolute: 0 10*3/uL (ref 0.0–0.5)
Eosinophils Relative: 0 %
HCT: 38.5 % (ref 36.0–46.0)
Hemoglobin: 13.1 g/dL (ref 12.0–15.0)
Immature Granulocytes: 1 %
Lymphocytes Relative: 14 %
Lymphs Abs: 0.6 10*3/uL — ABNORMAL LOW (ref 0.7–4.0)
MCH: 31 pg (ref 26.0–34.0)
MCHC: 34 g/dL (ref 30.0–36.0)
MCV: 91.2 fL (ref 80.0–100.0)
Monocytes Absolute: 0.2 10*3/uL (ref 0.1–1.0)
Monocytes Relative: 5 %
Neutro Abs: 3.4 10*3/uL (ref 1.7–7.7)
Neutrophils Relative %: 80 %
Platelets: 184 10*3/uL (ref 150–400)
RBC: 4.22 MIL/uL (ref 3.87–5.11)
RDW: 13.7 % (ref 11.5–15.5)
WBC: 4.2 10*3/uL (ref 4.0–10.5)
nRBC: 0 % (ref 0.0–0.2)

## 2020-09-04 LAB — COMPREHENSIVE METABOLIC PANEL
ALT: 51 U/L — ABNORMAL HIGH (ref 0–44)
AST: 203 U/L — ABNORMAL HIGH (ref 15–41)
Albumin: 2.7 g/dL — ABNORMAL LOW (ref 3.5–5.0)
Alkaline Phosphatase: 58 U/L (ref 38–126)
Anion gap: 11 (ref 5–15)
BUN: 44 mg/dL — ABNORMAL HIGH (ref 8–23)
CO2: 27 mmol/L (ref 22–32)
Calcium: 8.8 mg/dL — ABNORMAL LOW (ref 8.9–10.3)
Chloride: 106 mmol/L (ref 98–111)
Creatinine, Ser: 1.47 mg/dL — ABNORMAL HIGH (ref 0.44–1.00)
GFR, Estimated: 40 mL/min — ABNORMAL LOW (ref >60)
Glucose, Bld: 159 mg/dL — ABNORMAL HIGH (ref 70–99)
Potassium: 4.4 mmol/L (ref 3.5–5.1)
Sodium: 144 mmol/L (ref 135–145)
Total Bilirubin: 0.5 mg/dL (ref 0.3–1.2)
Total Protein: 6.4 g/dL — ABNORMAL LOW (ref 6.5–8.1)

## 2020-09-04 LAB — FERRITIN: Ferritin: 1097 ng/mL — ABNORMAL HIGH (ref 11–307)

## 2020-09-04 LAB — FIBRIN DERIVATIVES D-DIMER (ARMC ONLY): Fibrin derivatives D-dimer (ARMC): 1062.88 ng/mL (FEU) — ABNORMAL HIGH (ref 0.00–499.00)

## 2020-09-04 LAB — PHOSPHORUS: Phosphorus: 2.4 mg/dL — ABNORMAL LOW (ref 2.5–4.6)

## 2020-09-04 LAB — C DIFFICILE QUICK SCREEN W PCR REFLEX
C Diff antigen: NEGATIVE
C Diff interpretation: NOT DETECTED
C Diff toxin: NEGATIVE

## 2020-09-04 LAB — TSH: TSH: 0.59 u[IU]/mL (ref 0.350–4.500)

## 2020-09-04 LAB — MAGNESIUM: Magnesium: 1.8 mg/dL (ref 1.7–2.4)

## 2020-09-04 LAB — C-REACTIVE PROTEIN: CRP: 6.4 mg/dL — ABNORMAL HIGH (ref <1.0)

## 2020-09-04 MED ORDER — GABAPENTIN 300 MG PO CAPS
300.0000 mg | ORAL_CAPSULE | Freq: Three times a day (TID) | ORAL | Status: DC
  Administered 2020-09-04 – 2020-09-07 (×9): 300 mg via ORAL
  Filled 2020-09-04 (×8): qty 1

## 2020-09-04 MED ORDER — BOOST / RESOURCE BREEZE PO LIQD CUSTOM
1.0000 | Freq: Three times a day (TID) | ORAL | Status: DC
  Administered 2020-09-05 – 2020-09-06 (×5): 1 via ORAL

## 2020-09-04 NOTE — Progress Notes (Signed)
1        [place] [facility] [place] NAME: [name]    MR#:  [id number]  DATE OF BIRTH:  10-18-1960  SUBJECTIVE:  CHIEF COMPLAINT:   Chief Complaint  Patient presents with  . Altered Mental Status  . Hypotension  . Shortness of Breath  Still coughing.  Feeling short of breath.  Mentating much better. REVIEW OF SYSTEMS:  Review of Systems  Constitutional: Positive for malaise/fatigue. Negative for diaphoresis, fever and weight loss.  HENT: Negative for ear discharge, ear pain, hearing loss, nosebleeds, sore throat and tinnitus.   Eyes: Negative for blurred vision and pain.  Respiratory: Positive for cough, shortness of breath and wheezing. Negative for hemoptysis.   Cardiovascular: Negative for chest pain, palpitations, orthopnea and leg swelling.  Gastrointestinal: Negative for abdominal pain, blood in stool, constipation, diarrhea, heartburn, nausea and vomiting.  Genitourinary: Negative for dysuria, frequency and urgency.  Musculoskeletal: Negative for back pain and myalgias.  Skin: Negative for itching and rash.  Neurological: Negative for dizziness, tingling, tremors, focal weakness, seizures, weakness and headaches.  Psychiatric/Behavioral: Negative for depression. The patient is not nervous/anxious.    DRUG ALLERGIES:   Allergies  Allergen Reactions  . Fentanyl Other (See Comments)    Other reaction(s): Headache, Other (See Comments) headache SEVERE HEADACHE headache  . Sumatriptan Other (See Comments)    Other reaction(s): Headache Severe headache  . Doxycycline Other (See Comments)    Other reaction(s): Headache, Other (See Comments) REACTION: causes severe abd pain REACTION: causes severe abd pain REACTION: causes severe abd pain  . Montelukast Other (See Comments)    SEVERE HEADACHE  . Oxymorphone Other (See Comments)    REACTION: severe headaches  . Benzoin Rash  . Buspirone Other (See Comments)    SEVERE HEADACHE  . Ciprofloxacin  Nausea Only  . Conjugated Estrogens Other (See Comments)    headaches  . Cymbalta [Duloxetine Hcl] Other (See Comments)    Headache  . Duloxetine Other (See Comments)    Headache  . Erythromycin Ethylsuccinate Other (See Comments)    Stomach ache  . Estradiol Other (See Comments)    headache  . Metronidazole Other (See Comments)    abd pain  . Mirtazapine Other (See Comments)    headache   VITALS:  Blood pressure (!) 114/55, pulse 92, temperature 98.3 F (36.8 C), temperature source Oral, resp. rate 18, height 5\' 7"  (1.702 m), weight 85.3 kg, SpO2 93 %. PHYSICAL EXAMINATION:  Physical Exam HENT:     Head: Normocephalic and atraumatic.  Eyes:     Extraocular Movements: EOM normal.     Conjunctiva/sclera: Conjunctivae normal.     Pupils: Pupils are equal, round, and reactive to light.  Neck:     Thyroid: No thyromegaly.     Trachea: No tracheal deviation.  Cardiovascular:     Rate and Rhythm: Normal rate and regular rhythm.     Heart sounds: Normal heart sounds.  Pulmonary:     Effort: Pulmonary effort is normal. No respiratory distress.     Breath sounds: Examination of the right-lower field reveals decreased breath sounds, wheezing and rhonchi. Examination of the left-lower field reveals decreased breath sounds and wheezing. Decreased breath sounds, wheezing and rhonchi present.  Chest:     Chest wall: No tenderness.  Abdominal:     General: Bowel sounds are normal. There is no distension.     Palpations: Abdomen is soft.     Tenderness: There is no abdominal  tenderness.  Musculoskeletal:        General: Normal range of motion.     Cervical back: Normal range of motion and neck supple.  Skin:    General: Skin is warm and dry.     Findings: No rash.  Neurological:     Mental Status: She is alert and oriented to person, place, and time.     Cranial Nerves: No cranial nerve deficit.    LABORATORY PANEL:  Female CBC Recent Labs  Lab 09/04/20 0420  WBC 4.2  HGB  13.1  HCT 38.5  PLT 184   ------------------------------------------------------------------------------------------------------------------ Chemistries  Recent Labs  Lab 09/04/20 0420  NA 144  K 4.4  CL 106  CO2 27  GLUCOSE 159*  BUN 44*  CREATININE 1.47*  CALCIUM 8.8*  MG 1.8  AST 203*  ALT 51*  ALKPHOS 58  BILITOT 0.5   RADIOLOGY:  No results found. ASSESSMENT AND PLAN:  60 year old female with a known history of severe depression, chronic low back pain, hypothyroidism, hypertension is admitted for pneumonia due to COVID-19 infection.  Acute hypoxic respiratory failure due to pneumonia from COVID-19 virus Chest x-ray shows multifocal pneumonia. Currently requiring 2 L oxygen via nasal cannula. Continue remdesivir day 2/5.  Continue steroids Wean oxygen as able.  Acute kidney injury Baseline creatinine of 1.1, at admission creatinine of 2.97.  Creatinine down to 1.47 this morning. Thought to be from ATN due to hypotension Hold antihypertensive medications Continue IV fluids  Hypokalemia Repleted and resolved.  Hold HCTZ.  Hypothyroidism Continue Synthroid, check TSH  Anxiety and depression Continue venlafaxine, Seroquel and diazepam as needed  Transaminitis Monitor LFTs.  This could be due to viral infection.  Holding statins.  GERD Continue PPI  Chronic low back pain Patient is mostly bedbound status per patient's husband as reported in H&P.  Continue pain medicine as needed   Body mass index is 29.46 kg/m.  Net IO Since Admission: 1,240 mL [09/04/20 1052]      Level of care: Med-Surg  Status is: Inpatient  Remains inpatient appropriate because:Inpatient level of care appropriate due to severity of illness   Dispo: The patient is from: Home              Anticipated d/c is to: Home              Anticipated d/c date is: 3 days              Patient currently is not medically stable to d/c.  Patient is requiring IV remdesivir.  3 more  doses.   DVT prophylaxis:       enoxaparin (LOVENOX) injection 40 mg Start: 09/03/20 1200     Family Communication: ("discussed with patient")   All the records are reviewed and case discussed with Care Management/Social Worker. Management plans discussed with the patient, nursing and they are in agreement.  CODE STATUS: Full Code   TOTAL TIME TAKING CARE OF THIS PATIENT: 35 minutes.   More than 50% of the time was spent in counseling/coordination of care: YES  POSSIBLE D/C IN 3 DAYS, DEPENDING ON CLINICAL CONDITION.   Max Sane M.D on 09/04/2020 at 10:52 AM  [facility]   CC: Primary care physician; Einar Pheasant, MD  Note: This dictation was prepared with Dragon dictation along with smaller phrase technology. Any transcriptional errors that result from this process are unintentional.

## 2020-09-04 NOTE — Progress Notes (Signed)
Patient complaining of diarrhea and requested medication. MD contacted. Orders placed for stool collection to eliminate possibility of C. Diff infection. If negative, imodium can be ordered per MD. Primary RN made aware. Madlyn Frankel, RN

## 2020-09-04 NOTE — Progress Notes (Signed)
Initial Nutrition Assessment  INTERVENTION:   -Boost Breeze po TID, each supplement provides 250 kcal and 9 grams of protein  -Multivitamin with minerals daily  NUTRITION DIAGNOSIS:   Increased nutrient needs related to acute illness (COVID-19 infection) as evidenced by estimated needs.  GOAL:   Patient will meet greater than or equal to 90% of their needs  MONITOR:   PO intake,Supplement acceptance,Labs,Weight trends,I & O's  REASON FOR ASSESSMENT:   Malnutrition Screening Tool    ASSESSMENT:   60 y.o. female with medical history significant for severe depression, chronic low back pain, hypothyroidism and hypertension who presents to the emergency room via EMS for evaluation of mental status changes, shortness of breath and hypoxia.  Patient's husband who provides most of the history states that she has been sick for about 2 weeks and was diagnosed with COVID-19 viral infection on 08/21/20.  Patient reports her stomach has been feeling bad today. States she was having diarrhea PTA and this is a common occurrence for her when she is sick or stressed. Pt states she has been "nibbling" on rice krispies that her husband brought her from home. Pt is willing to try Boost Breeze supplements as these may be lighter on her stomach.  Per weight records, pt has lost 17 lbs since 06/05/20 (8% wt loss x 3 months, significant for time frame).  Medications: Vitamin C, OSCAL-D, Multivitamin with minerals daily, Lovaza, Zinc sulfate  Labs reviewed: Low Phos  NUTRITION - FOCUSED PHYSICAL EXAM:  Unable to complete, will attempt at follow-up  Diet Order:   Diet Order            Diet Heart Room service appropriate? Yes; Fluid consistency: Thin  Diet effective now                 EDUCATION NEEDS:   No education needs have been identified at this time  Skin:  Skin Assessment: Reviewed RN Assessment  Last BM:  1/21 -type 7  Height:   Ht Readings from Last 1 Encounters:   09/03/20 5\' 7"  (1.702 m)    Weight:   Wt Readings from Last 1 Encounters:  09/03/20 85.3 kg   BMI:  Body mass index is 29.46 kg/m.  Estimated Nutritional Needs:   Kcal:  1700-1900  Protein:  85-100g  Fluid:  1.9L/day   Clayton Bibles, MS, RD, LDN Inpatient Clinical Dietitian Contact information available via Amion

## 2020-09-05 DIAGNOSIS — U071 COVID-19: Secondary | ICD-10-CM | POA: Diagnosis not present

## 2020-09-05 DIAGNOSIS — N179 Acute kidney failure, unspecified: Secondary | ICD-10-CM | POA: Diagnosis not present

## 2020-09-05 DIAGNOSIS — J1282 Pneumonia due to coronavirus disease 2019: Secondary | ICD-10-CM | POA: Diagnosis not present

## 2020-09-05 DIAGNOSIS — R197 Diarrhea, unspecified: Secondary | ICD-10-CM | POA: Diagnosis not present

## 2020-09-05 LAB — CBC WITH DIFFERENTIAL/PLATELET
Abs Immature Granulocytes: 0.04 10*3/uL (ref 0.00–0.07)
Basophils Absolute: 0 10*3/uL (ref 0.0–0.1)
Basophils Relative: 0 %
Eosinophils Absolute: 0 10*3/uL (ref 0.0–0.5)
Eosinophils Relative: 0 %
HCT: 41.4 % (ref 36.0–46.0)
Hemoglobin: 13.9 g/dL (ref 12.0–15.0)
Immature Granulocytes: 1 %
Lymphocytes Relative: 12 %
Lymphs Abs: 0.8 10*3/uL (ref 0.7–4.0)
MCH: 30.5 pg (ref 26.0–34.0)
MCHC: 33.6 g/dL (ref 30.0–36.0)
MCV: 90.8 fL (ref 80.0–100.0)
Monocytes Absolute: 0.5 10*3/uL (ref 0.1–1.0)
Monocytes Relative: 7 %
Neutro Abs: 5.3 10*3/uL (ref 1.7–7.7)
Neutrophils Relative %: 80 %
Platelets: 266 10*3/uL (ref 150–400)
RBC: 4.56 MIL/uL (ref 3.87–5.11)
RDW: 13.6 % (ref 11.5–15.5)
WBC: 6.6 10*3/uL (ref 4.0–10.5)
nRBC: 0.3 % — ABNORMAL HIGH (ref 0.0–0.2)

## 2020-09-05 LAB — PHOSPHORUS: Phosphorus: 2.8 mg/dL (ref 2.5–4.6)

## 2020-09-05 LAB — URINE CULTURE: Culture: >=100000 — AB

## 2020-09-05 LAB — COMPREHENSIVE METABOLIC PANEL
ALT: 52 U/L — ABNORMAL HIGH (ref 0–44)
AST: 167 U/L — ABNORMAL HIGH (ref 15–41)
Albumin: 3 g/dL — ABNORMAL LOW (ref 3.5–5.0)
Alkaline Phosphatase: 61 U/L (ref 38–126)
Anion gap: 15 (ref 5–15)
BUN: 33 mg/dL — ABNORMAL HIGH (ref 8–23)
CO2: 28 mmol/L (ref 22–32)
Calcium: 9.5 mg/dL (ref 8.9–10.3)
Chloride: 105 mmol/L (ref 98–111)
Creatinine, Ser: 1.12 mg/dL — ABNORMAL HIGH (ref 0.44–1.00)
GFR, Estimated: 56 mL/min — ABNORMAL LOW (ref >60)
Glucose, Bld: 144 mg/dL — ABNORMAL HIGH (ref 70–99)
Potassium: 3.4 mmol/L — ABNORMAL LOW (ref 3.5–5.1)
Sodium: 148 mmol/L — ABNORMAL HIGH (ref 135–145)
Total Bilirubin: 0.6 mg/dL (ref 0.3–1.2)
Total Protein: 6.8 g/dL (ref 6.5–8.1)

## 2020-09-05 LAB — MAGNESIUM: Magnesium: 1.6 mg/dL — ABNORMAL LOW (ref 1.7–2.4)

## 2020-09-05 LAB — C-REACTIVE PROTEIN: CRP: 2.8 mg/dL — ABNORMAL HIGH (ref <1.0)

## 2020-09-05 LAB — FIBRIN DERIVATIVES D-DIMER (ARMC ONLY): Fibrin derivatives D-dimer (ARMC): 969.73 ng/mL (FEU) — ABNORMAL HIGH (ref 0.00–499.00)

## 2020-09-05 LAB — FERRITIN: Ferritin: 1042 ng/mL — ABNORMAL HIGH (ref 11–307)

## 2020-09-05 MED ORDER — LOPERAMIDE HCL 2 MG PO CAPS: 2.0000 mg | ORAL_CAPSULE | Freq: Four times a day (QID) | ORAL | Status: DC | PRN

## 2020-09-05 MED ORDER — POTASSIUM CHLORIDE CRYS ER 20 MEQ PO TBCR
40.0000 meq | EXTENDED_RELEASE_TABLET | Freq: Once | ORAL | Status: AC
  Administered 2020-09-05: 40 meq via ORAL
  Filled 2020-09-05: qty 2

## 2020-09-05 MED ORDER — MAGNESIUM SULFATE 2 GM/50ML IV SOLN
2.0000 g | Freq: Once | INTRAVENOUS | Status: AC
  Administered 2020-09-05: 2 g via INTRAVENOUS
  Filled 2020-09-05: qty 50

## 2020-09-05 NOTE — Hospital Course (Addendum)
1/20 -> acute hypoxic respiratory failure due to COVID-19.  On 2 L oxygen.  On remdesivir and steroids 1/21 -> diarrhea.  Checking C. difficile. 1/22 -> C. difficile negative.  Starting Imodium.  Low potassium and magnesium being repleted.  Day 3/5 of remdesivir and steroids. 1/23 -> day 4/5 of remdesivir.  Lasix 40 mg IV once.  Continue to wean her off oxygen.  Currently on 2 L oxygen.

## 2020-09-05 NOTE — Progress Notes (Addendum)
1        [facility] at [place] NAME: [name]    MR#:  [id number]  DATE OF BIRTH:  1961/08/13  SUBJECTIVE:  CHIEF COMPLAINT:   Chief Complaint  Patient presents with  . Altered Mental Status  . Hypotension  . Shortness of Breath  Cough and shortness of breath present.  Diarrhea improving.  No bowel movement this morning.  Last night had a loose bowel movement REVIEW OF SYSTEMS:  Review of Systems  Constitutional: Positive for malaise/fatigue. Negative for diaphoresis, fever and weight loss.  HENT: Negative for ear discharge, ear pain, hearing loss, nosebleeds, sore throat and tinnitus.   Eyes: Negative for blurred vision and pain.  Respiratory: Positive for cough, shortness of breath and wheezing. Negative for hemoptysis.   Cardiovascular: Negative for chest pain, palpitations, orthopnea and leg swelling.  Gastrointestinal: Positive for diarrhea. Negative for abdominal pain, blood in stool, constipation, heartburn, nausea and vomiting.  Genitourinary: Negative for dysuria, frequency and urgency.  Musculoskeletal: Negative for back pain and myalgias.  Skin: Negative for itching and rash.  Neurological: Negative for dizziness, tingling, tremors, focal weakness, seizures, weakness and headaches.  Psychiatric/Behavioral: Negative for depression. The patient is not nervous/anxious.    DRUG ALLERGIES:   Allergies  Allergen Reactions  . Fentanyl Other (See Comments)    Other reaction(s): Headache, Other (See Comments) headache SEVERE HEADACHE headache  . Sumatriptan Other (See Comments)    Other reaction(s): Headache Severe headache  . Doxycycline Other (See Comments)    Other reaction(s): Headache, Other (See Comments) REACTION: causes severe abd pain REACTION: causes severe abd pain REACTION: causes severe abd pain  . Montelukast Other (See Comments)    SEVERE HEADACHE  . Oxymorphone Other (See Comments)    REACTION: severe headaches  . Benzoin  Rash  . Buspirone Other (See Comments)    SEVERE HEADACHE  . Ciprofloxacin Nausea Only  . Conjugated Estrogens Other (See Comments)    headaches  . Cymbalta [Duloxetine Hcl] Other (See Comments)    Headache  . Duloxetine Other (See Comments)    Headache  . Erythromycin Ethylsuccinate Other (See Comments)    Stomach ache  . Estradiol Other (See Comments)    headache  . Metronidazole Other (See Comments)    abd pain  . Mirtazapine Other (See Comments)    headache   VITALS:  Blood pressure 120/80, pulse 98, temperature 97.6 F (36.4 C), temperature source Oral, resp. rate 12, height 5\' 7"  (1.702 m), weight 85.3 kg, SpO2 93 %. PHYSICAL EXAMINATION:  Physical Exam HENT:     Head: Normocephalic and atraumatic.  Eyes:     Conjunctiva/sclera: Conjunctivae normal.     Pupils: Pupils are equal, round, and reactive to light.  Neck:     Thyroid: No thyromegaly.     Trachea: No tracheal deviation.  Cardiovascular:     Rate and Rhythm: Normal rate and regular rhythm.     Heart sounds: Normal heart sounds.  Pulmonary:     Effort: Pulmonary effort is normal. No respiratory distress.     Breath sounds: Examination of the right-lower field reveals decreased breath sounds and wheezing. Examination of the left-lower field reveals decreased breath sounds and wheezing. Decreased breath sounds and wheezing present.  Chest:     Chest wall: No tenderness.  Abdominal:     General: Bowel sounds are normal. There is no distension.     Palpations: Abdomen is soft.     Tenderness: There  is no abdominal tenderness.  Musculoskeletal:        General: Normal range of motion.     Cervical back: Normal range of motion and neck supple.  Skin:    General: Skin is warm and dry.     Findings: No rash.  Neurological:     Mental Status: She is alert and oriented to person, place, and time.     Cranial Nerves: No cranial nerve deficit.    LABORATORY PANEL:  Female CBC Recent Labs  Lab 09/05/20 0438   WBC 6.6  HGB 13.9  HCT 41.4  PLT 266   ------------------------------------------------------------------------------------------------------------------ Chemistries  Recent Labs  Lab 09/05/20 0438  NA 148*  K 3.4*  CL 105  CO2 28  GLUCOSE 144*  BUN 33*  CREATININE 1.12*  CALCIUM 9.5  MG 1.6*  AST 167*  ALT 52*  ALKPHOS 61  BILITOT 0.6   RADIOLOGY:  No results found. ASSESSMENT AND PLAN:  60 year old female with a known history of severe depression, chronic low back pain, hypothyroidism, hypertension is admitted for pneumonia due to COVID-19 infection.  1/20 -> acute hypoxic respiratory failure due to COVID-19.  On 2 L oxygen.  On remdesivir and steroids 1/21 -> diarrhea.  Checking C. difficile. 1/22 -> C. difficile negative.  Starting Imodium.  Low potassium and magnesium being repleted.  Day 3/5 of remdesivir and steroids.   Acute hypoxic respiratory failure due to pneumonia from COVID-19 virus Chest x-ray shows multifocal pneumonia. Currently requiring 2 L oxygen via nasal cannula. Continue remdesivir day 3/5.  Continue steroids Wean oxygen as able.  Acute kidney injury Baseline creatinine of 1.1, at admission creatinine of 2.97.  Creatinine down to 1.12 this morning. Thought to be from ATN due to hypotension Hold antihypertensive medications Continue IV fluids  Diarrhea Negative C. Difficile.  Likely viral, improving Try Imodium as needed  Hypokalemia/hypomagnesemia Hold HCTZ.  Diarrhea making this worse Pharmacy replacing electrolytes  Hypothyroidism Continue Synthroid, normal TSH  Anxiety and depression Continue venlafaxine, Seroquel and diazepam as needed  Transaminitis Monitor LFTs.  This could be due to viral infection.  Holding statins.  GERD Continue PPI  Chronic low back pain Patient is mostly bedbound status per patient's husband as reported in H&P.  Continue pain medicine as needed   Body mass index is 29.46 kg/m.  Net IO Since  Admission: 1,340 mL [09/05/20 1213]      Level of care: Med-Surg  Status is: Inpatient  Remains inpatient appropriate because:Inpatient level of care appropriate due to severity of illness   Dispo: The patient is from: Home              Anticipated d/c is to: Home              Anticipated d/c date is: 2 days              Patient currently is not medically stable to d/c.  Patient is requiring IV remdesivir.  2 more doses.   DVT prophylaxis:       enoxaparin (LOVENOX) injection 40 mg Start: 09/03/20 1200     Family Communication: ("discussed with patient")   All the records are reviewed and case discussed with Care Management/Social Worker. Management plans discussed with the patient, nursing and they are in agreement.  CODE STATUS: Full Code   TOTAL TIME TAKING CARE OF THIS PATIENT: 35 minutes.   More than 50% of the time was spent in counseling/coordination of care: YES  POSSIBLE D/C IN  2 DAYS, DEPENDING ON CLINICAL CONDITION.   Max Sane M.D on 09/05/2020 at 12:13 PM  [facility]   CC: Primary care physician; Einar Pheasant, MD  Note: This dictation was prepared with Dragon dictation along with smaller phrase technology. Any transcriptional errors that result from this process are unintentional.

## 2020-09-05 NOTE — TOC Initial Note (Signed)
Transition of Care [facility]) - Initial/Assessment Note    Patient Details  Name: [name] MRN: [id number] Date of Birth: May 03, 1961  Transition of Care [facility]) CM/SW Contact:    Trecia Rogers, [place] Phone Number: 09/05/2020, 12:33 PM  Clinical Narrative:                  Patient presented with shortness of breath, COVID19 and altered mental status. Patient tested positive for COVID on 08/21/2020 and continues to struggle with the symptoms. Patient has improved with her mental status. Patient reports that she is expected to discharge on Monday home. Patient reports feeling sick to her stomach and not being able to eat and having some nerves. Patient lives at home with her husband and 62 year old grandson who moved in to help after some past surgeries. Patient reports that she does have 2 walkers, canes and a bath chair at home. Patient's husband does assist her with watching her bath to make sure that she does not fall. Patient does have a bed that is able to lift and go back for her back.  Patient's PCP is Einar Pheasant, MD [facility] [place] [facility] of [address] and she is able to get her medications without problems or concerns.   No other needs at this time.   Expected Discharge Plan: Home/Self Care Barriers to Discharge: Continued Medical Work up   Patient Goals and CMS Choice Patient states their goals for this hospitalization and ongoing recovery are:: "go home" CMS Medicare.gov Compare Post Acute Care list provided to:: Patient Choice offered to / list presented to : Patient  Expected Discharge Plan and Services Expected Discharge Plan: Home/Self Care       Living arrangements for the past 2 months: Single Family Home                                      Prior Living Arrangements/Services Living arrangements for the past 2 months: Single Family Home Lives with:: Self,Spouse,Other (Comment) (adult grandson) Patient language and need for  interpreter reviewed:: Yes Do you feel safe going back to the place where you live?: Yes      Need for Family Participation in Patient Care: Yes (Comment) (patient's husband and grandson) Care giver support system in place?: Yes (comment) Current home services: DME (2 walkers, cane and bath chair) Criminal Activity/Legal Involvement Pertinent to Current Situation/Hospitalization: No - Comment as needed  Activities of Daily Living Home Assistive Devices/Equipment: Walker (specify type) ADL Screening (condition at time of admission) Patient's cognitive ability adequate to safely complete daily activities?: Yes Is the patient deaf or have difficulty hearing?: No Does the patient have difficulty seeing, even when wearing glasses/contacts?: No Does the patient have difficulty concentrating, remembering, or making decisions?: Yes Patient able to express need for assistance with ADLs?: Yes Does the patient have difficulty dressing or bathing?: No Independently performs ADLs?: Yes (appropriate for developmental age) Does the patient have difficulty walking or climbing stairs?: Yes Weakness of Legs: Both Weakness of Arms/Hands: None  Permission Sought/Granted                  Emotional Assessment Appearance:: Other (Comment Required (assessed via phone) Attitude/Demeanor/Rapport: Gracious,Engaged Affect (typically observed): Unable to Assess Orientation: : Oriented to Self,Oriented to Place,Oriented to  Time,Oriented to Situation Alcohol / Substance Use: Not Applicable Psych Involvement: No (comment)  Admission diagnosis:  Dehydration [E86.0] Hypoxia [R09.02] AKI (acute kidney injury) ([place]) [N17.9] Pneumonia due to COVID-19 virus [U07.1, J12.82] COVID-19 [U07.1] Patient Active Problem List   Diagnosis Date Noted  . Pneumonia due to COVID-19 virus 09/03/2020  . AKI (acute kidney injury) ([place])   . Hypothyroidism   . Transaminitis   . Acute respiratory failure due to COVID-19  ([place])   . Bereavement 02/05/2020  . MDD (major depressive disorder), recurrent, in partial remission ([place]) 10/29/2019  . MDD (major depressive disorder), recurrent episode, moderate ([place]) 06/12/2019  . Breast pain 01/14/2018  . Abdominal pain 01/14/2018  . Hot flashes 01/25/2017  . Chest pain on exertion 12/15/2016  . Chest tightness 11/25/2016  . SOB (shortness of breath) 11/25/2016  . 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 Chronic thyroiditis 06/15/2016  . Anxiety state 09/03/2015  . Ache in joint 09/03/2015  . Benign hypertension 09/03/2015  . Endometriosis 09/03/2015  . History of migraine headaches 09/03/2015  . H/O adenomatous polyp of colon 09/03/2015  . Hypokalemia 09/03/2015  . Adaptive colitis 09/03/2015  . Apnea, sleep 09/03/2015  . Health care maintenance 08/06/2015  . History of colonic polyps 06/30/2015  . Rash 05/05/2015  . 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 Memory difficulty 11/15/2013  . Diplopia 11/15/2013  . 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 Incomplete bladder emptying 10/31/2013  . Depression 12/03/2012  . Essential hypertension, benign 12/03/2012  . Hypercholesterolemia 12/03/2012  . Obstructive sleep apnea 12/03/2012  . Collagenous colitis 12/03/2012  . Dermatomyositis ([place]) 11/12/2009  . ALLERGIC RHINITIS 05/25/2007  . Asthma 05/25/2007  . GERD 05/25/2007  . Back pain 05/25/2007  . HEADACHE 05/25/2007   PCP:  Einar Pheasant, MD Pharmacy:   [id number] [address], [place] [address] [place] [address] Phone: [phone] Fax: [phone]  [facility] [place], [place] [address] Phone: [phone] Fax: [phone]     Social Determinants of Health (SDOH) Interventions    Readmission Risk Interventions No flowsheet data found.

## 2020-09-06 DIAGNOSIS — N179 Acute kidney failure, unspecified: Secondary | ICD-10-CM | POA: Diagnosis not present

## 2020-09-06 DIAGNOSIS — M545 Low back pain, unspecified: Secondary | ICD-10-CM | POA: Diagnosis not present

## 2020-09-06 DIAGNOSIS — J1282 Pneumonia due to coronavirus disease 2019: Secondary | ICD-10-CM | POA: Diagnosis not present

## 2020-09-06 DIAGNOSIS — U071 COVID-19: Secondary | ICD-10-CM | POA: Diagnosis not present

## 2020-09-06 LAB — CBC WITH DIFFERENTIAL/PLATELET
Abs Immature Granulocytes: 0.04 10*3/uL (ref 0.00–0.07)
Basophils Absolute: 0 10*3/uL (ref 0.0–0.1)
Basophils Relative: 0 %
Eosinophils Absolute: 0 10*3/uL (ref 0.0–0.5)
Eosinophils Relative: 0 %
HCT: 42 % (ref 36.0–46.0)
Hemoglobin: 14.1 g/dL (ref 12.0–15.0)
Immature Granulocytes: 1 %
Lymphocytes Relative: 12 %
Lymphs Abs: 0.9 10*3/uL (ref 0.7–4.0)
MCH: 31.1 pg (ref 26.0–34.0)
MCHC: 33.6 g/dL (ref 30.0–36.0)
MCV: 92.7 fL (ref 80.0–100.0)
Monocytes Absolute: 0.4 10*3/uL (ref 0.1–1.0)
Monocytes Relative: 5 %
Neutro Abs: 6.4 10*3/uL (ref 1.7–7.7)
Neutrophils Relative %: 82 %
Platelets: 260 10*3/uL (ref 150–400)
RBC: 4.53 MIL/uL (ref 3.87–5.11)
RDW: 13.8 % (ref 11.5–15.5)
WBC: 7.7 10*3/uL (ref 4.0–10.5)
nRBC: 0.3 % — ABNORMAL HIGH (ref 0.0–0.2)

## 2020-09-06 LAB — COMPREHENSIVE METABOLIC PANEL
ALT: 56 U/L — ABNORMAL HIGH (ref 0–44)
AST: 131 U/L — ABNORMAL HIGH (ref 15–41)
Albumin: 2.9 g/dL — ABNORMAL LOW (ref 3.5–5.0)
Alkaline Phosphatase: 58 U/L (ref 38–126)
Anion gap: 11 (ref 5–15)
BUN: 33 mg/dL — ABNORMAL HIGH (ref 8–23)
CO2: 33 mmol/L — ABNORMAL HIGH (ref 22–32)
Calcium: 9 mg/dL (ref 8.9–10.3)
Chloride: 104 mmol/L (ref 98–111)
Creatinine, Ser: 1.08 mg/dL — ABNORMAL HIGH (ref 0.44–1.00)
GFR, Estimated: 58 mL/min — ABNORMAL LOW (ref >60)
Glucose, Bld: 107 mg/dL — ABNORMAL HIGH (ref 70–99)
Potassium: 3.2 mmol/L — ABNORMAL LOW (ref 3.5–5.1)
Sodium: 148 mmol/L — ABNORMAL HIGH (ref 135–145)
Total Bilirubin: 0.6 mg/dL (ref 0.3–1.2)
Total Protein: 6.6 g/dL (ref 6.5–8.1)

## 2020-09-06 LAB — C-REACTIVE PROTEIN: CRP: 1.3 mg/dL — ABNORMAL HIGH (ref <1.0)

## 2020-09-06 LAB — BASIC METABOLIC PANEL
Anion gap: 10 (ref 5–15)
BUN: 31 mg/dL — ABNORMAL HIGH (ref 8–23)
CO2: 32 mmol/L (ref 22–32)
Calcium: 8.9 mg/dL (ref 8.9–10.3)
Chloride: 102 mmol/L (ref 98–111)
Creatinine, Ser: 1.11 mg/dL — ABNORMAL HIGH (ref 0.44–1.00)
GFR, Estimated: 57 mL/min — ABNORMAL LOW (ref >60)
Glucose, Bld: 176 mg/dL — ABNORMAL HIGH (ref 70–99)
Potassium: 4.3 mmol/L (ref 3.5–5.1)
Sodium: 144 mmol/L (ref 135–145)

## 2020-09-06 LAB — MAGNESIUM: Magnesium: 1.8 mg/dL (ref 1.7–2.4)

## 2020-09-06 LAB — PHOSPHORUS: Phosphorus: 2.5 mg/dL (ref 2.5–4.6)

## 2020-09-06 LAB — FIBRIN DERIVATIVES D-DIMER (ARMC ONLY): Fibrin derivatives D-dimer (ARMC): 819.57 ng/mL (FEU) — ABNORMAL HIGH (ref 0.00–499.00)

## 2020-09-06 LAB — FERRITIN: Ferritin: 914 ng/mL — ABNORMAL HIGH (ref 11–307)

## 2020-09-06 LAB — TSH: TSH: 0.48 u[IU]/mL (ref 0.350–4.500)

## 2020-09-06 MED ORDER — FUROSEMIDE 10 MG/ML IJ SOLN
40.0000 mg | Freq: Once | INTRAMUSCULAR | Status: AC
  Administered 2020-09-06: 10:00:00 40 mg via INTRAVENOUS

## 2020-09-06 MED ORDER — POTASSIUM CHLORIDE CRYS ER 20 MEQ PO TBCR
40.0000 meq | EXTENDED_RELEASE_TABLET | Freq: Once | ORAL | Status: AC
  Administered 2020-09-06: 40 meq via ORAL
  Filled 2020-09-06: qty 2

## 2020-09-06 MED ORDER — POTASSIUM CHLORIDE CRYS ER 20 MEQ PO TBCR
40.0000 meq | EXTENDED_RELEASE_TABLET | Freq: Once | ORAL | Status: AC
  Administered 2020-09-06: 10:00:00 40 meq via ORAL

## 2020-09-06 MED ORDER — OXYCODONE HCL 5 MG PO TABS: 10.0000 mg | ORAL_TABLET | Freq: Three times a day (TID) | ORAL | Status: DC | PRN

## 2020-09-06 MED ORDER — OXYCODONE HCL ER 10 MG PO T12A
20.0000 mg | EXTENDED_RELEASE_TABLET | Freq: Two times a day (BID) | ORAL | Status: DC
  Administered 2020-09-06 – 2020-09-07 (×2): 20 mg via ORAL
  Filled 2020-09-06 (×2): qty 2

## 2020-09-06 MED ORDER — DEXTROSE 5 % IV SOLN: INTRAVENOUS | Status: AC

## 2020-09-06 NOTE — Progress Notes (Signed)
1        [place] at [place] NAME: [name]    MR#:  [id number]  DATE OF BIRTH:  [birth year]  SUBJECTIVE:  CHIEF COMPLAINT:   Chief Complaint  Patient presents with  . Altered Mental Status  . Hypotension  . Shortness of Breath  Improving cough and shortness of breath. REVIEW OF SYSTEMS:  Review of Systems  Constitutional: Positive for malaise/fatigue. Negative for diaphoresis, fever and weight loss.  HENT: Negative for ear discharge, ear pain, hearing loss, nosebleeds, sore throat and tinnitus.   Eyes: Negative for blurred vision and pain.  Respiratory: Positive for cough and shortness of breath. Negative for hemoptysis.   Cardiovascular: Negative for chest pain, palpitations, orthopnea and leg swelling.  Gastrointestinal: Positive for diarrhea. Negative for abdominal pain, blood in stool, constipation, heartburn, nausea and vomiting.  Genitourinary: Negative for dysuria, frequency and urgency.  Musculoskeletal: Negative for back pain and myalgias.  Skin: Negative for itching and rash.  Neurological: Negative for dizziness, tingling, tremors, focal weakness, seizures, weakness and headaches.  Psychiatric/Behavioral: Negative for depression. The patient is not nervous/anxious.    DRUG ALLERGIES:   Allergies  Allergen Reactions  . Fentanyl Other (See Comments)    Other reaction(s): Headache, Other (See Comments) headache SEVERE HEADACHE headache  . Sumatriptan Other (See Comments)    Other reaction(s): Headache Severe headache  . Doxycycline Other (See Comments)    Other reaction(s): Headache, Other (See Comments) REACTION: causes severe abd pain REACTION: causes severe abd pain REACTION: causes severe abd pain  . Montelukast Other (See Comments)    SEVERE HEADACHE  . Oxymorphone Other (See Comments)    REACTION: severe headaches  . Benzoin Rash  . Buspirone Other (See Comments)    SEVERE HEADACHE  . Ciprofloxacin Nausea Only  .  Conjugated Estrogens Other (See Comments)    headaches  . Cymbalta [Duloxetine Hcl] Other (See Comments)    Headache  . Duloxetine Other (See Comments)    Headache  . Erythromycin Ethylsuccinate Other (See Comments)    Stomach ache  . Estradiol Other (See Comments)    headache  . Metronidazole Other (See Comments)    abd pain  . Mirtazapine Other (See Comments)    headache   VITALS:  Blood pressure 123/79, pulse 93, temperature 98.2 F (36.8 C), resp. rate 16, height 5\' 7"  (1.702 m), weight 85.3 kg, SpO2 93 %. PHYSICAL EXAMINATION:  Physical Exam HENT:     Head: Normocephalic and atraumatic.  Eyes:     Conjunctiva/sclera: Conjunctivae normal.     Pupils: Pupils are equal, round, and reactive to light.  Neck:     Thyroid: No thyromegaly.     Trachea: No tracheal deviation.  Cardiovascular:     Rate and Rhythm: Normal rate and regular rhythm.     Heart sounds: Normal heart sounds.  Pulmonary:     Effort: Pulmonary effort is normal. No respiratory distress.     Breath sounds: Examination of the right-lower field reveals decreased breath sounds. Examination of the left-lower field reveals decreased breath sounds. Decreased breath sounds present.  Chest:     Chest wall: No tenderness.  Abdominal:     General: Bowel sounds are normal. There is no distension.     Palpations: Abdomen is soft.     Tenderness: There is no abdominal tenderness.  Musculoskeletal:        General: Normal range of motion.     Cervical back: Normal range of  motion and neck supple.  Skin:    General: Skin is warm and dry.     Findings: No rash.  Neurological:     Mental Status: She is alert and oriented to person, place, and time.     Cranial Nerves: No cranial nerve deficit.    LABORATORY PANEL:  Female CBC Recent Labs  Lab 09/06/20 0507  WBC 7.7  HGB 14.1  HCT 42.0  PLT 260    ------------------------------------------------------------------------------------------------------------------ Chemistries  Recent Labs  Lab 09/06/20 0507  NA 148*  K 3.2*  CL 104  CO2 33*  GLUCOSE 107*  BUN 33*  CREATININE 1.08*  CALCIUM 9.0  MG 1.8  AST 131*  ALT 56*  ALKPHOS 58  BILITOT 0.6   RADIOLOGY:  No results found. ASSESSMENT AND PLAN:  60 year old female with a known history of severe depression, chronic low back pain, hypothyroidism, hypertension is admitted for pneumonia due to COVID-19 infection.  1/20 -> acute hypoxic respiratory failure due to COVID-19.  On 2 L oxygen.  On remdesivir and steroids 1/21 -> diarrhea.  Checking C. difficile. 1/22 -> C. difficile negative.  Starting Imodium.  Low potassium and magnesium being repleted.  Day 3/5 of remdesivir and steroids. 1/23 -> day 4/5 of remdesivir.  Lasix 40 mg IV once.  Continue to wean her off oxygen.  Currently on 2 L oxygen.   Acute hypoxic respiratory failure due to pneumonia from COVID-19 virus Chest x-ray shows multifocal pneumonia. Currently requiring 2 L oxygen via nasal cannula. Continue remdesivir day 4/5.  Continue steroids Wean oxygen as able.  In nursing flowsheet it's documented that she is on 4 L but when I saw her in the room she was only on 2 L. As she is positive fluid balance.  We will give 40 mg of IV Lasix once and see if it helps her diuresis and thus wean her oxygen off  Acute kidney injury Baseline creatinine of 1.1, at admission creatinine of 2.97.  Creatinine down to 1.12 this morning. Thought to be from ATN due to hypotension  Diarrhea Negative C. Difficile.  Likely viral, improving Try Imodium as needed  Hypokalemia/hypomagnesemia Hold HCTZ.  Diarrhea making this worse Pharmacy replacing electrolytes  Hypothyroidism Continue Synthroid, normal TSH  Anxiety and depression Continue venlafaxine, Seroquel and diazepam as needed  Transaminitis Monitor LFTs.  This  could be due to viral infection.  Holding statins.  GERD Continue PPI  Chronic low back pain Patient is mostly bedbound status per patient's husband as reported in H&P.  Continue pain medicine as needed   Body mass index is 29.46 kg/m.  Net IO Since Admission: 1,440 mL [09/06/20 1228]      Level of care: Med-Surg  Status is: Inpatient  Remains inpatient appropriate because:Inpatient level of care appropriate due to severity of illness   Dispo: The patient is from: Home              Anticipated d/c is to: Home              Anticipated d/c date is: 1 day              Patient currently is not medically stable to d/c.  Patient is requiring IV remdesivir.  1 more doses.   DVT prophylaxis:       enoxaparin (LOVENOX) injection 40 mg Start: 09/03/20 1200     Family Communication: ("discussed with patient")   All the records are reviewed and case discussed with Care Management/Social Worker.  Management plans discussed with the patient, nursing and they are in agreement.  CODE STATUS: Full Code   TOTAL TIME TAKING CARE OF THIS PATIENT: 35 minutes.   More than 50% of the time was spent in counseling/coordination of care: YES  POSSIBLE D/C IN 1 DAYS, DEPENDING ON CLINICAL CONDITION.   Max Sane M.D on 09/06/2020 at 12:28 PM  [facility]   CC: Primary care physician; Einar Pheasant, MD  Note: This dictation was prepared with Dragon dictation along with smaller phrase technology. Any transcriptional errors that result from this process are unintentional.

## 2020-09-06 NOTE — Progress Notes (Signed)
Updated husband via telephone, he expressed appreciation with no further questions

## 2020-09-07 ENCOUNTER — Telehealth: Payer: Self-pay | Admitting: Internal Medicine

## 2020-09-07 DIAGNOSIS — E860 Dehydration: Secondary | ICD-10-CM

## 2020-09-07 DIAGNOSIS — N179 Acute kidney failure, unspecified: Secondary | ICD-10-CM | POA: Diagnosis not present

## 2020-09-07 DIAGNOSIS — J1282 Pneumonia due to coronavirus disease 2019: Secondary | ICD-10-CM | POA: Diagnosis not present

## 2020-09-07 DIAGNOSIS — U071 COVID-19: Secondary | ICD-10-CM | POA: Diagnosis not present

## 2020-09-07 LAB — FIBRIN DERIVATIVES D-DIMER (ARMC ONLY): Fibrin derivatives D-dimer (ARMC): 707.46 ng/mL (FEU) — ABNORMAL HIGH (ref 0.00–499.00)

## 2020-09-07 LAB — COMPREHENSIVE METABOLIC PANEL
ALT: 53 U/L — ABNORMAL HIGH (ref 0–44)
AST: 84 U/L — ABNORMAL HIGH (ref 15–41)
Albumin: 2.7 g/dL — ABNORMAL LOW (ref 3.5–5.0)
Alkaline Phosphatase: 54 U/L (ref 38–126)
Anion gap: 12 (ref 5–15)
BUN: 24 mg/dL — ABNORMAL HIGH (ref 8–23)
CO2: 30 mmol/L (ref 22–32)
Calcium: 8.6 mg/dL — ABNORMAL LOW (ref 8.9–10.3)
Chloride: 103 mmol/L (ref 98–111)
Creatinine, Ser: 0.97 mg/dL (ref 0.44–1.00)
GFR, Estimated: >60 mL/min (ref >60)
Glucose, Bld: 98 mg/dL (ref 70–99)
Potassium: 3.2 mmol/L — ABNORMAL LOW (ref 3.5–5.1)
Sodium: 145 mmol/L (ref 135–145)
Total Bilirubin: 0.7 mg/dL (ref 0.3–1.2)
Total Protein: 6.2 g/dL — ABNORMAL LOW (ref 6.5–8.1)

## 2020-09-07 LAB — CBC WITH DIFFERENTIAL/PLATELET
Abs Immature Granulocytes: 0.05 10*3/uL (ref 0.00–0.07)
Basophils Absolute: 0 10*3/uL (ref 0.0–0.1)
Basophils Relative: 0 %
Eosinophils Absolute: 0 10*3/uL (ref 0.0–0.5)
Eosinophils Relative: 0 %
HCT: 40 % (ref 36.0–46.0)
Hemoglobin: 13.4 g/dL (ref 12.0–15.0)
Immature Granulocytes: 1 %
Lymphocytes Relative: 13 %
Lymphs Abs: 1.1 10*3/uL (ref 0.7–4.0)
MCH: 30.7 pg (ref 26.0–34.0)
MCHC: 33.5 g/dL (ref 30.0–36.0)
MCV: 91.7 fL (ref 80.0–100.0)
Monocytes Absolute: 0.5 10*3/uL (ref 0.1–1.0)
Monocytes Relative: 6 %
Neutro Abs: 6.7 10*3/uL (ref 1.7–7.7)
Neutrophils Relative %: 80 %
Platelets: 256 10*3/uL (ref 150–400)
RBC: 4.36 MIL/uL (ref 3.87–5.11)
RDW: 13.2 % (ref 11.5–15.5)
WBC: 8.4 10*3/uL (ref 4.0–10.5)
nRBC: 0 % (ref 0.0–0.2)

## 2020-09-07 LAB — MAGNESIUM: Magnesium: 1.7 mg/dL (ref 1.7–2.4)

## 2020-09-07 LAB — PHOSPHORUS: Phosphorus: 2.9 mg/dL (ref 2.5–4.6)

## 2020-09-07 LAB — C-REACTIVE PROTEIN: CRP: 6 mg/dL — ABNORMAL HIGH (ref <1.0)

## 2020-09-07 LAB — FERRITIN: Ferritin: 820 ng/mL — ABNORMAL HIGH (ref 11–307)

## 2020-09-07 MED ORDER — FUROSEMIDE 10 MG/ML IJ SOLN
40.0000 mg | Freq: Once | INTRAMUSCULAR | Status: AC
  Administered 2020-09-07: 40 mg via INTRAVENOUS
  Filled 2020-09-07: qty 4

## 2020-09-07 MED ORDER — METOPROLOL TARTRATE 5 MG/5ML IV SOLN
10.0000 mg | INTRAVENOUS | Status: AC
  Administered 2020-09-07: 10 mg via INTRAVENOUS
  Filled 2020-09-07: qty 10

## 2020-09-07 MED ORDER — POTASSIUM CHLORIDE CRYS ER 20 MEQ PO TBCR
40.0000 meq | EXTENDED_RELEASE_TABLET | Freq: Once | ORAL | Status: AC
  Administered 2020-09-07: 12:00:00 40 meq via ORAL
  Filled 2020-09-07: qty 2

## 2020-09-07 MED ORDER — ASCORBIC ACID 500 MG PO TABS: 500.0000 mg | ORAL_TABLET | Freq: Every day | ORAL | 0 refills | Status: DC

## 2020-09-07 MED ORDER — MAGNESIUM OXIDE 400 (241.3 MG) MG PO TABS
400.0000 mg | ORAL_TABLET | Freq: Once | ORAL | Status: AC
  Administered 2020-09-07: 12:00:00 400 mg via ORAL
  Filled 2020-09-07: qty 1

## 2020-09-07 MED ORDER — PREDNISONE 10 MG (21) PO TBPK: ORAL_TABLET | ORAL | 0 refills | Status: DC

## 2020-09-07 MED ORDER — ZINC SULFATE 220 (50 ZN) MG PO CAPS: 220.0000 mg | ORAL_CAPSULE | Freq: Every day | ORAL | 0 refills | Status: DC

## 2020-09-07 NOTE — Progress Notes (Addendum)
This RN explained to the pt that the need for home oxygen might be greater than 2L and also explained that her HR raises while ambulating. Pt still wants to go home AMA. AMA form obtained. Dr. Manuella Ghazi is aware of the situation. MD made pt aware of the risks and needs of leaving AMA earlier this morning.  1010 Pt qualified for home O2, HR to the 120-130's while ambulating. MD made aware. She still wants to go home (AMA), DC orders in place. I am requesting the pt to give [place] time to coordinate care with care management so that she can get home safe.

## 2020-09-07 NOTE — Care Management Important Message (Signed)
Important Message  Patient Details  Name: [name] MRN: [id number] Date of Birth: November 13, 1960   Medicare Important Message Given:  Yes     Shelbie Hutching, RN 09/07/2020, 11:17 AM

## 2020-09-07 NOTE — Progress Notes (Signed)
Lopressor IV 10 mg given per MD's order for elevated HR. Pt's HR in the 90's at this time.

## 2020-09-07 NOTE — Telephone Encounter (Signed)
Does she need TCM? I don't think she has been discharged yet.

## 2020-09-07 NOTE — Progress Notes (Addendum)
Pt states she's "ready to go home". Pt's husband has been "waiting outside for long time"

## 2020-09-07 NOTE — TOC Progression Note (Signed)
Transition of Care [facility]) - Progression Note    Patient Details  Name: [name] MRN: [id number] Date of Birth: 1960-08-31  Transition of Care [facility]) CM/SW Contact  Shelbie Hutching, RN Phone Number: 09/07/2020, 10:07 AM  Clinical Narrative:    Patient wants to go home today she will need home oxygen.  Zach with [facility] given referral for home O2.  Patient agrees to home health through [facility].  Corene Cornea with [facility] accepted referral for RN, PT, and OT.  Patient's husband will be picking her up today once oxygen has been delivered to the room.  TOC will provide patient with donated pulse oximeter so she can check her oxygen sats at home.    Expected Discharge Plan: Home/Self Care Barriers to Discharge: Continued Medical Work up  Expected Discharge Plan and Services Expected Discharge Plan: Home/Self Care       Living arrangements for the past 2 months: Single Family Home Expected Discharge Date: 09/07/20                                     Social Determinants of Health (SDOH) Interventions    Readmission Risk Interventions No flowsheet data found.

## 2020-09-07 NOTE — Discharge Instructions (Signed)
COVID-19 Quarantine vs. Isolation QUARANTINE keeps someone who was in close contact with someone who has COVID-19 away from others. Quarantine if you have been in close contact with someone who has COVID-19, unless you have been fully vaccinated. If you are fully vaccinated  You do NOT need to quarantine unless they have symptoms  Get tested 3-5 days after your exposure, even if you don't have symptoms  Wear a mask indoors in public for 14 days following exposure or until your test result is negative If you are not fully vaccinated  Stay home for 14 days after your last contact with a person who has COVID-19  Watch for fever (100.28F), cough, shortness of breath, or other symptoms of COVID-19  If possible, stay away from people you live with, especially people who are at higher risk for getting very sick from COVID-19  Contact your local public health department for options in your area to possibly shorten your quarantine ISOLATION keeps someone who is sick or tested positive for COVID-19 without symptoms away from others, even in their own home. People who are in isolation should stay home and stay in a specific "sick room" or area and use a separate bathroom (if available). If you are sick and think or know you have COVID-19 Stay home until after  At least 10 days since symptoms first appeared and  At least 24 hours with no fever without the use of fever-reducing medications and  Symptoms have improved If you tested positive for COVID-19 but do not have symptoms  Stay home until after 10 days have passed since your positive viral test  If you develop symptoms after testing positive, follow the steps above for those who are sick [email or web address] 05/11/2020 This information is not intended to replace advice given to you by your health care provider. Make sure you discuss any questions you have with your health care provider. Document Revised: 06/15/2020 Document Reviewed:  06/15/2020 Elsevier Patient Education  2021 [place]: What to Do if You Are Sick If you have a fever, cough or other symptoms, you might have COVID-19. Most people have mild illness and are able to recover at home. If you are sick:  Keep track of your symptoms.  If you have an emergency warning sign (including trouble breathing), call 911. Steps to help prevent the spread of COVID-19 if you are sick If you are sick with COVID-19 or think you might have COVID-19, follow the steps below to care for yourself and to help protect other people in your home and community. Stay home except to get medical care  Stay home. Most people with COVID-19 have mild illness and can recover at home without medical care. Do not leave your home, except to get medical care. Do not visit public areas.  Take care of yourself. Get rest and stay hydrated. Take over-the-counter medicines, such as acetaminophen, to help you feel better.  Stay in touch with your doctor. Call before you get medical care. Be sure to get care if you have trouble breathing, or have any other emergency warning signs, or if you think it is an emergency.  Avoid public transportation, ride-sharing, or taxis. Separate yourself from other people As much as possible, stay in a specific room and away from other people and pets in your home. If possible, you should use a separate bathroom. If you need to be around other people or animals in or outside of the home, wear a mask. Tell your close  contactsthat they may have been exposed to COVID-19. An infected person can spread COVID-19 starting 48 hours (or 2 days) before the person has any symptoms or tests positive. By letting your close contacts know they may have been exposed to COVID-19, you are helping to protect everyone.  Additional guidance is available for those living in close quarters and shared housing.  See COVID-19 and Animals if you have questions about pets.  If you  are diagnosed with COVID-19, someone from the health department may call you. Answer the call to slow the spread. Monitor your symptoms  Symptoms of COVID-19 include fever, cough, or other symptoms.  Follow care instructions from your healthcare provider and local health department. Your local health authorities may give instructions on checking your symptoms and reporting information. When to seek emergency medical attention Look for emergency warning signs* for COVID-19. If someone is showing any of these signs, seek emergency medical care immediately:  Trouble breathing  Persistent pain or pressure in the chest  New confusion  Inability to wake or stay awake  Pale, gray, or blue-colored skin, lips, or nail beds, depending on skin tone *This list is not all possible symptoms. Please call your medical provider for any other symptoms that are severe or concerning to you. Call 911 or call ahead to your local emergency facility: Notify the operator that you are seeking care for someone who has or may have COVID-19. Call ahead before visiting your doctor  Call ahead. Many medical visits for routine care are being postponed or done by phone or telemedicine.  If you have a medical appointment that cannot be postponed, call your doctor's office, and tell them you have or may have COVID-19. This will help the office protect themselves and other patients. Get  tested  If you have symptoms of COVID-19, get tested. While waiting for test results, you stay away from others, including staying apart from those living in your household.  You can visit your state, tribal, local, and territorialhealth department's website to look for the latest local information on testing sites. If you are sick, wear a mask over your nose and mouth  You should wear a mask over your nose and mouth if you must be around other people or animals, including pets (even at home).  You don't need to wear the mask if you  are alone. If you can't put on a mask (because of trouble breathing, for example), cover your coughs and sneezes in some other way. Try to stay at least 6 feet away from other people. This will help protect the people around you.  Masks should not be placed on young children under age 54 years, anyone who has trouble breathing, or anyone who is not able to remove the mask without help. Note: During the COVID-19 pandemic, medical grade facemasks are reserved for healthcare workers and some first responders. Cover your coughs and sneezes  Cover your mouth and nose with a tissue when you cough or sneeze.  Throw away used tissues in a lined trash can.  Immediately wash your hands with soap and water for at least 20 seconds. If soap and water are not available, clean your hands with an alcohol-based hand sanitizer that contains at least 60% alcohol. Clean your hands often  Wash your hands often with soap and water for at least 20 seconds. This is especially important after blowing your nose, coughing, or sneezing; going to the bathroom; and before eating or preparing food.  Use hand  sanitizer if soap and water are not available. Use an alcohol-based hand sanitizer with at least 60% alcohol, covering all surfaces of your hands and rubbing them together until they feel dry.  Soap and water are the best option, especially if hands are visibly dirty.  Avoid touching your eyes, nose, and mouth with unwashed hands.  Handwashing Tips Avoid sharing personal household items  Do not share dishes, drinking glasses, cups, eating utensils, towels, or bedding with other people in your home.  Wash these items thoroughly after using them with soap and water or put in the dishwasher. Clean all "high-touch" surfaces everyday  Clean and disinfect high-touch surfaces in your "sick room" and bathroom; wear disposable gloves. Let someone else clean and disinfect surfaces in common areas, but you should clean your  bedroom and bathroom, if possible.  If a caregiver or other person needs to clean and disinfect a sick person's bedroom or bathroom, they should do so on an as-needed basis. The caregiver/other person should wear a mask and disposable gloves prior to cleaning. They should wait as long as possible after the person who is sick has used the bathroom before coming in to clean and use the bathroom. ? High-touch surfaces include phones, remote controls, counters, tabletops, doorknobs, bathroom fixtures, toilets, keyboards, tablets, and bedside tables.  Clean and disinfect areas that may have blood, stool, or body fluids on them.  Use household cleaners and disinfectants. Clean the area or item with soap and water or another detergent if it is dirty. Then, use a household disinfectant. ? Be sure to follow the instructions on the label to ensure safe and effective use of the product. Many products recommend keeping the surface wet for several minutes to ensure germs are killed. Many also recommend precautions such as wearing gloves and making sure you have good ventilation during use of the product. ? Use a product from [employer]: Disinfectants for Coronavirus (GQQPY-19). ? Complete Disinfection Guidance When you can be around others after being sick with COVID-19 Deciding when you can be around others is different for different situations. Find out when you can safely end home isolation. For any additional questions about your care, contact your healthcare provider or state or local health department. 10/30/2019 Content source: [facility] ([facility]), Division of Viral Diseases This information is not intended to replace advice given to you by your health care provider. Make sure you discuss any questions you have with your health care provider. Document Revised: 06/15/2020 Document Reviewed: 06/15/2020 Elsevier Patient Education  2021 [place] Can Do to Manage Your COVID-19 Symptoms at Home If you have possible or confirmed COVID-19: 1. Stay home except to get medical care. 2. Monitor your symptoms carefully. If your symptoms get worse, call your healthcare provider immediately. 3. Get rest and stay hydrated. 4. If you have a medical appointment, call the healthcare provider ahead of time and tell them that you have or may have COVID-19. 5. For medical emergencies, call 911 and notify the dispatch personnel that you have or may have COVID-19. 6. Cover your cough and sneezes with a tissue or use the inside of your elbow. 7. Wash your hands often with soap and water for at least 20 seconds or clean your hands with an alcohol-based hand sanitizer that contains at least 60% alcohol. 8. As much as possible, stay in a specific room and away from other people in your home. Also, you  should use a separate bathroom, if available. If you need to be around other people in or outside of the home, wear a mask. 9. Avoid sharing personal items with other people in your household, like dishes, towels, and bedding. 10. Clean all surfaces that are touched often, like counters, tabletops, and doorknobs. Use household cleaning sprays or wipes according to the label instructions. [email or web address] 02/28/2020 This information is not intended to replace advice given to you by your health care provider. Make sure you discuss any questions you have with your health care provider. Document Revised: 06/15/2020 Document Reviewed: 06/15/2020 Elsevier Patient Education  2021 [employer].

## 2020-09-07 NOTE — TOC Progression Note (Signed)
Transition of Care [facility]) - Progression Note    Patient Details  Name: [name] MRN: [id number] Date of Birth: 1961-05-19  [facility]) CM/SW Contact  Shelbie Hutching, RN Phone Number: 09/07/2020, 11:16 AM  Clinical Narrative:    Oxygen has been delivered, pulse oximeter also given to patient to take home with her.    Expected Discharge Plan: Home/Self Care Barriers to Discharge: Barriers Resolved  Expected Discharge Plan and Services Expected Discharge Plan: Home/Self Care       Living arrangements for the past 2 months: Single Family Home Expected Discharge Date: 09/07/20               DME Arranged: Irena Cords oximeter DME Agency: [facility] Date DME Agency Contacted: 09/07/20 Time DME Agency Contacted: [id number] Representative spoke with at DME Agency: [place]: RN,PT,OT [place] Agency: [place] ([place]) Date [place]: 09/07/20 Time [place]: [phone] Representative spoke with at [place]: [place] ([place]) Interventions    Readmission Risk Interventions No flowsheet data found.

## 2020-09-07 NOTE — Progress Notes (Addendum)
SATURATION QUALIFICATIONS: (This note is used to comply with regulatory documentation for home oxygen)  Patient Saturations on Room Air at Rest =88%  Patient Saturations on Room Air while Ambulating = 84%  Patient Saturations on 4-6 Liters of oxygen while Ambulating = 87%  Remains 91-93% while in bed with no excerption   Please briefly explain why patient needs home oxygen: drops to 84% on room air while ambulating.

## 2020-09-07 NOTE — Telephone Encounter (Signed)
Yes this hospital stay is eligible for TCM. However, because patient will be seen face to face within 2 days of discharge it will qualify for TCM without the call. Will follow as appropriate. Discharged today. [facility] to contact patient tomorrow.

## 2020-09-07 NOTE — Telephone Encounter (Signed)
[facility] called to schedule a HFU in 2 days PT is scheduled for 09/09/20 @ 11:30

## 2020-09-08 ENCOUNTER — Telehealth: Payer: Self-pay

## 2020-09-08 LAB — CULTURE, BLOOD (ROUTINE X 2)
Culture: NO GROWTH
Culture: NO GROWTH
Special Requests: ADEQUATE

## 2020-09-08 NOTE — Telephone Encounter (Signed)
Transition Care Management Unsuccessful Follow-up Telephone Call  Date of discharge and from where:  09/07/20 from [facility]  Attempts:  1st Attempt  Reason for unsuccessful TCM follow-up call:  No answer/busy  Unable to leave a message, no voicemail. HFU scheduled 09/09/20 @ 11:30. This visit does qualify for TCM.

## 2020-09-09 ENCOUNTER — Telehealth: Payer: Self-pay | Admitting: Internal Medicine

## 2020-09-09 ENCOUNTER — Telehealth (INDEPENDENT_AMBULATORY_CARE_PROVIDER_SITE_OTHER): Payer: Medicare Other | Admitting: Internal Medicine

## 2020-09-09 DIAGNOSIS — K589 Irritable bowel syndrome without diarrhea: Secondary | ICD-10-CM | POA: Diagnosis not present

## 2020-09-09 DIAGNOSIS — U071 COVID-19: Secondary | ICD-10-CM | POA: Diagnosis not present

## 2020-09-09 DIAGNOSIS — F419 Anxiety disorder, unspecified: Secondary | ICD-10-CM | POA: Diagnosis not present

## 2020-09-09 DIAGNOSIS — R7401 Elevation of levels of liver transaminase levels: Secondary | ICD-10-CM | POA: Diagnosis not present

## 2020-09-09 DIAGNOSIS — G4730 Sleep apnea, unspecified: Secondary | ICD-10-CM | POA: Diagnosis not present

## 2020-09-09 DIAGNOSIS — M545 Low back pain, unspecified: Secondary | ICD-10-CM

## 2020-09-09 DIAGNOSIS — Z9181 History of falling: Secondary | ICD-10-CM | POA: Diagnosis not present

## 2020-09-09 DIAGNOSIS — J9601 Acute respiratory failure with hypoxia: Secondary | ICD-10-CM | POA: Diagnosis not present

## 2020-09-09 DIAGNOSIS — J309 Allergic rhinitis, unspecified: Secondary | ICD-10-CM | POA: Diagnosis not present

## 2020-09-09 DIAGNOSIS — E860 Dehydration: Secondary | ICD-10-CM

## 2020-09-09 DIAGNOSIS — J45909 Unspecified asthma, uncomplicated: Secondary | ICD-10-CM | POA: Diagnosis not present

## 2020-09-09 DIAGNOSIS — R32 Unspecified urinary incontinence: Secondary | ICD-10-CM | POA: Diagnosis not present

## 2020-09-09 DIAGNOSIS — J4520 Mild intermittent asthma, uncomplicated: Secondary | ICD-10-CM

## 2020-09-09 DIAGNOSIS — Z79891 Long term (current) use of opiate analgesic: Secondary | ICD-10-CM | POA: Diagnosis not present

## 2020-09-09 DIAGNOSIS — J1282 Pneumonia due to coronavirus disease 2019: Secondary | ICD-10-CM

## 2020-09-09 DIAGNOSIS — F3341 Major depressive disorder, recurrent, in partial remission: Secondary | ICD-10-CM

## 2020-09-09 DIAGNOSIS — H532 Diplopia: Secondary | ICD-10-CM | POA: Diagnosis not present

## 2020-09-09 DIAGNOSIS — R0902 Hypoxemia: Secondary | ICD-10-CM

## 2020-09-09 DIAGNOSIS — G8929 Other chronic pain: Secondary | ICD-10-CM | POA: Diagnosis not present

## 2020-09-09 DIAGNOSIS — N6019 Diffuse cystic mastopathy of unspecified breast: Secondary | ICD-10-CM | POA: Diagnosis not present

## 2020-09-09 DIAGNOSIS — I10 Essential (primary) hypertension: Secondary | ICD-10-CM | POA: Diagnosis not present

## 2020-09-09 DIAGNOSIS — J42 Unspecified chronic bronchitis: Secondary | ICD-10-CM | POA: Diagnosis not present

## 2020-09-09 DIAGNOSIS — F32A Depression, unspecified: Secondary | ICD-10-CM | POA: Diagnosis not present

## 2020-09-09 DIAGNOSIS — E785 Hyperlipidemia, unspecified: Secondary | ICD-10-CM | POA: Diagnosis not present

## 2020-09-09 DIAGNOSIS — K52831 Collagenous colitis: Secondary | ICD-10-CM | POA: Diagnosis not present

## 2020-09-09 DIAGNOSIS — R1310 Dysphagia, unspecified: Secondary | ICD-10-CM | POA: Diagnosis not present

## 2020-09-09 DIAGNOSIS — K219 Gastro-esophageal reflux disease without esophagitis: Secondary | ICD-10-CM | POA: Diagnosis not present

## 2020-09-09 DIAGNOSIS — G43909 Migraine, unspecified, not intractable, without status migrainosus: Secondary | ICD-10-CM | POA: Diagnosis not present

## 2020-09-09 DIAGNOSIS — Z9981 Dependence on supplemental oxygen: Secondary | ICD-10-CM | POA: Diagnosis not present

## 2020-09-09 DIAGNOSIS — E876 Hypokalemia: Secondary | ICD-10-CM

## 2020-09-09 DIAGNOSIS — E890 Postprocedural hypothyroidism: Secondary | ICD-10-CM | POA: Diagnosis not present

## 2020-09-09 MED FILL — Umeclidinium Br Aero Powd Breath Act 62.5 MCG/ACT (Base Eq): RESPIRATORY_TRACT | Qty: 7 | Status: AC

## 2020-09-09 NOTE — Telephone Encounter (Signed)
Pt has appt today

## 2020-09-09 NOTE — Progress Notes (Signed)
Patient ID: [name], female   DOB: 12/20/1960, 60 y.o.   MRN: [id number]   Virtual Visit via video Note  This visit type was conducted due to national recommendations for restrictions regarding the COVID-19 pandemic (e.g. social distancing).  This format is felt to be most appropriate for this patient at this time.  All issues noted in this document were discussed and addressed.  No physical exam was performed (except for noted visual exam findings with Video Visits).   I connected with [name] by a video enabled telemedicine application and verified that I am speaking with the correct person using two identifiers. Location patient: home Location provider: work Persons participating in the virtual visit: patient, provider and pts husband.   The limitations, risks, security and privacy concerns of performing an evaluation and management service by video and the availability of in person appointments have been discussed.  It has also been discussed with the patient that there may be a patient responsible charge related to this service. The patient expressed understanding and agreed to proceed.   Reason for visit: hospital follow up.   HPI: Admitted 09/03/20 - 09/07/20 with pneumonia due to covid 10 infection.  Diagnosed with acute hypoxic respiratory failure. Records and labs reviewed.  CXR revealed multifocal pneumonia.  Completed course of remdesivir.  Per pt, she left AMA.  She was discharged on prednisone taper.  Also on oxygen - 3L.  Also found to have AKI - felt to be due to dehydration.  Kidney function improved with hydration.  Potassium and magnesium - low.  Replaced and she continues on potassium supplements. Since her discharge, she reports feeling better.  Still requiring oxygen.  No increased cough or congestion.  Still with decreased appetite, but is eating better.  No vomiting now.  One episode of diarrhea a couple of days ago, but reports diarrhea has cleared.  No fever.   Husband reports the confusion is better.  Using incentive spirometer.  Discussed the need to get up and move around.  Discussed PT.  She is agreeable.  Has lost weight - 13 pounds per pt report.  seroquel added at night.     ROS: See pertinent positives and negatives per HPI.  Past Medical History:  Diagnosis Date  . Allergic rhinitis   . Anxiety   . Arthralgia   . Asthma   . Bronchitis, chronic ([place])   . Chronic back pain   . Collagenous colitis    followed by Dr Tiffany Kocher  . Depression   . Diplopia 11/15/2013  . Dysphagia   . Endometriosis   . Fibrocystic breast disease   . GERD (gastroesophageal reflux disease)   . History of colon polyps   . Hyperlipidemia   . Hypertension   . Hypokalemia   . IBS (irritable bowel syndrome)   . Memory difficulty 11/15/2013  . Migraine headache   . Pneumonia 2017   x2  . PONV (postoperative nausea and vomiting)    used patch  . Sleep apnea    recommended CPAP  . Ulcer disease   . Urine incontinence     Past Surgical History:  Procedure Laterality Date  . ABDOMINAL HYSTERECTOMY     endometriosis  . BACK SURGERY     x6  . BREAST BIOPSY Right   . COLONOSCOPY WITH PROPOFOL N/A 06/25/2015   Procedure: COLONOSCOPY WITH PROPOFOL;  Surgeon: Manya Silvas, MD;  Location: [facility] ENDOSCOPY;  Service: Endoscopy;  Laterality: N/A;  . DILATION AND CURETTAGE  OF UTERUS     and hysteroscopy  . ESOPHAGOGASTRODUODENOSCOPY N/A 06/25/2015   Procedure: ESOPHAGOGASTRODUODENOSCOPY (EGD);  Surgeon: Manya Silvas, MD;  Location: [facility] ENDOSCOPY;  Service: Endoscopy;  Laterality: N/A;  . insertion of permanent spinal cord stimulator    . insertion of trial spinal cord stimulator    . LUMBAR DISC SURGERY    . LUMBAR FUSION  4/05 and 8/07  . LUMBAR MICRODISCECTOMY  03/28/03   L4-5 L5  . MOUTH SURGERY    . NASAL SEPTOPLASTY W/ TURBINOPLASTY    . removal of spinal cord stimulator    . SEPTOPLASTY     with reduction of turbinates  . THYROIDECTOMY N/A  06/06/2016   Procedure: THYROIDECTOMY;  Surgeon: Robert Bellow, MD;  Location: [facility] ORS;  Service: General;  Laterality: N/A;    Family History  Problem Relation Age of Onset  . Allergies Mother   . Hypertension Mother   . Arthritis Mother   . Hyperlipidemia Mother   . Stroke Mother   . Hypertension Father   . Arthritis Father   . Diabetes Father   . Hypertension Brother   . Diabetes Brother   . Arthritis Maternal Grandmother   . Hyperlipidemia Maternal Grandmother   . Hyperlipidemia Maternal Grandfather   . Arthritis Paternal Grandmother   . Arthritis Paternal Grandfather   . Asthma Other        several family member  . Leukemia Other        uncle  . Lymphoma Maternal Aunt        non hodgkins    SOCIAL HX: reviewed.    Current Outpatient Medications:  .  albuterol (PROVENTIL) (2.5 MG/3ML) 0.083% nebulizer solution, Take 2.5 mg by nebulization every 4 (four) hours as needed for wheezing or shortness of breath., Disp: , Rfl:  .  albuterol (VENTOLIN HFA) 108 (90 Base) MCG/ACT inhaler, Inhale 2 puffs into the lungs every 4 (four) hours as needed for wheezing or shortness of breath., Disp: 1 Inhaler, Rfl: 3 .  amLODipine (NORVASC) 5 MG tablet, TAKE 1 TABLET BY MOUTH EVERY DAY (Patient taking differently: Take 5 mg by mouth daily.), Disp: 90 tablet, Rfl: 1 .  ascorbic acid (VITAMIN C) 500 MG tablet, Take 1 tablet (500 mg total) by mouth daily., Disp: 30 tablet, Rfl: 0 .  bisacodyl (DULCOLAX) 5 MG EC tablet, Take 5 mg by mouth daily as needed for moderate constipation., Disp: , Rfl:  .  diazepam (VALIUM) 5 MG tablet, Take 5 mg by mouth 3 (three) times daily as needed for anxiety., Disp: , Rfl:  .  docusate sodium (COLACE) 100 MG capsule, Take 100 mg by mouth 2 (two) times daily., Disp: , Rfl:  .  Fluticasone-Umeclidin-Vilant (TRELEGY ELLIPTA) 100-62.5-25 MCG/INH AEPB, Inhale 1 puff into the lungs daily., Disp: 1 each, Rfl: 11 .  GRALISE 600 MG TABS, Take 600 mg by mouth 3  (three) times daily., Disp: , Rfl:  .  hydrochlorothiazide (HYDRODIURIL) 25 MG tablet, Take 25 mg by mouth daily., Disp: , Rfl: 11 .  metoprolol tartrate (LOPRESSOR) 50 MG tablet, TAKE 1/2 TABLET BY MOUTH TWICE A DAY (NEED OFFICE VISIT) (Patient taking differently: Take 25 mg by mouth 2 (two) times daily.), Disp: 90 tablet, Rfl: 0 .  Multiple Vitamin (MULTIVITAMIN) capsule, Take 1 capsule by mouth daily., Disp: , Rfl:  .  Omega-3 Fatty Acids (FISH OIL) 1200 MG CAPS, Take 1 capsule by mouth daily., Disp: , Rfl:  .  omeprazole (PRILOSEC) 40 MG  capsule, TAKE 1 CAPSULE (40 MG TOTAL) BY MOUTH AT BEDTIME., Disp: 90 capsule, Rfl: 1 .  OS-CAL CALCIUM + D3 500-200 MG-UNIT TABS, Take 2 tablets by mouth as directed., Disp: , Rfl: 6 .  Oxycodone HCl 10 MG TABS, Take 10 mg by mouth 3 (three) times daily as needed (breakthrough pain)., Disp: , Rfl:  .  pravastatin (PRAVACHOL) 10 MG tablet, TAKE 1 TABLET (10 MG TOTAL) BY MOUTH DAILY. (Patient taking differently: Take 10 mg by mouth daily.), Disp: 90 tablet, Rfl: 1 .  predniSONE (STERAPRED UNI-PAK 21 TAB) 10 MG (21) TBPK tablet, Start 60 mg po daily, taper 10 mg once daily until done, Disp: 21 tablet, Rfl: 0 .  QUEtiapine (SEROQUEL) 50 MG tablet, Take 1 tablet (50 mg total) by mouth at bedtime., Disp: 30 tablet, Rfl: 1 .  TIROSINT 137 MCG CAPS, TAKE 1 CAPSULE BY MOUTH DAILY BEFORE BREAKFAST. (Patient taking differently: Take 137 mcg by mouth daily before breakfast.), Disp: 30 capsule, Rfl: 2 .  venlafaxine XR (EFFEXOR-XR) 75 MG 24 hr capsule, Take 3 capsules in the morning (Patient taking differently: Take 225 mg by mouth every morning.), Disp: 270 capsule, Rfl: 0 .  XTAMPZA ER 36 MG C12A, Take 36 mg by mouth every 12 (twelve) hours., Disp: , Rfl: 0 .  zinc sulfate 220 (50 Zn) MG capsule, Take 1 capsule (220 mg total) by mouth daily., Disp: 30 capsule, Rfl: 0 .  zolpidem (AMBIEN) 10 MG tablet, Take 10 mg by mouth at bedtime as needed for sleep., Disp: , Rfl:    EXAM:  VITALS per patient if applicable: puse 70, pulse ox 93% on 3L  GENERAL: alert, oriented, appears well and in no acute distress  HEENT: atraumatic, conjunttiva clear, no obvious abnormalities on inspection of external nose and ears  NECK: normal movements of the head and neck  LUNGS: on inspection no signs of respiratory distress, breathing rate appears normal, no obvious gross SOB, gasping or wheezing  CV: no obvious cyanosis  PSYCH/NEURO: pleasant and cooperative, no obvious depression or anxiety, speech and thought processing grossly intact  ASSESSMENT AND PLAN:  Discussed the following assessment and plan:  Problem List Items Addressed This Visit    Asthma    Recent admission for covid pneumonia.  Treated with remdesivir.  Continues on steroid taper.  Continue trelegy.  On oxygen.  Home health - to monitor O2 sats and hopefully will be able to wean oxygen in the near future.  No acute sob or wheezing currently.        Back pain    Chronic. Followed by pain clinic.       COVID-19    Recently admitted with covid pneumonia - hypoxia and AKI.  Diarrhea resolved.  Appetite has improved some.  Trying to stay hydrated.  Still requiring oxygen.  Continue oxygen.  Home health to monitor O2 sats and hopefully will be able to wean oxygen in the near future.  Continue steroid taper.  Continue trelegy.  Discussed need for f/u cxr in 6-8 weeks.       Dehydration    Received IVFs in hospital.  Continue to encourage increased fluid intake.        Essential hypertension, benign    Blood pressure has been doing well on amlodipine, hctz and metoprolol.  Home health nurse coming today.  Will have them check BP.       Hypokalemia    On potassium supplements - oral now.  Was given potassium in  hospital.  Labs reviewed.  Will have home health draw f/u potassium.        Hypoxia    Requiring oxygen.  Will have home health monitor oxygen saturation.        MDD (major depressive  disorder), recurrent, in partial remission ([place])    Has been followed by Dr Toy Care.  Has been on abilify and effexor.  Has been stable.       Pneumonia due to COVID-19 virus    Recent admission as outlined.  Given IV remdesivir and decadron. Completing steroid taper.  On oxygen.  Have home health monitor O2 sats.  Arrange PT.  Continue to stay hydrated.  Will need f/u cxr in 6-8 weeks.  Schedule f/u appt next week to reassess.  Call with update.       Transaminitis    Elevated liver test in hospital. Presumed to be related to covid.  Will have home health draw f/u liver panel to reassess.  Further w/up pending results.            I discussed the assessment and treatment plan with the patient. The patient was provided an opportunity to ask questions and all were answered. The patient agreed with the plan and demonstrated an understanding of the instructions.   The patient was advised to call back or seek an in-person evaluation if the symptoms worsen or if the condition fails to improve as anticipated.  I provided over 30 minutes of face-to-face time during this encounter. Time spent discussing her recent hospitalization and current symptoms.  Time also spent discussing need for further evaluation and treatment plan - including PT, etc.     Einar Pheasant, MD

## 2020-09-09 NOTE — Telephone Encounter (Signed)
Noted  

## 2020-09-09 NOTE — Telephone Encounter (Signed)
What labs were you wanting them to draw?

## 2020-09-09 NOTE — Telephone Encounter (Signed)
Met b, liver panel and cbc.  Also would like to start PT.

## 2020-09-09 NOTE — Telephone Encounter (Signed)
RN Luellen Pucker from [facility] called in need order for labs that [facility] want her to draw for patient

## 2020-09-09 NOTE — Discharge Summary (Signed)
[place] [facility] [place] NAME: [name]    MR#:  [id number]  DATE OF BIRTH:  [birth year]  DATE OF ADMISSION:  09/03/2020   ADMITTING PHYSICIAN: Collier Bullock, MD  DATE OF DISCHARGE: 09/07/2020 12:16 PM  PRIMARY CARE PHYSICIAN: Einar Pheasant, MD   ADMISSION DIAGNOSIS:  Dehydration [E86.0] Hypoxia [R09.02] AKI (acute kidney injury) ([place]) [N17.9] Pneumonia due to COVID-19 virus [U07.1, J12.82] COVID-19 [U07.1] DISCHARGE DIAGNOSIS:  Principal Problem:   Pneumonia due to COVID-19 virus Active Problems:   GERD   Back pain   Depression   Benign hypertension   Hypokalemia   AKI (acute kidney injury) ([place])   Hypothyroidism   Transaminitis   COVID-19   Dehydration  SECONDARY DIAGNOSIS:   Past Medical History:  Diagnosis Date  . Allergic rhinitis   . Anxiety   . Arthralgia   . Asthma   . Bronchitis, chronic ([place])   . Chronic back pain   . Collagenous colitis    followed by Dr Tiffany Kocher  . Depression   . Diplopia 11/15/2013  . Dysphagia   . Endometriosis   . Fibrocystic breast disease   . GERD (gastroesophageal reflux disease)   . History of colon polyps   . Hyperlipidemia   . Hypertension   . Hypokalemia   . IBS (irritable bowel syndrome)   . Memory difficulty 11/15/2013  . Migraine headache   . Pneumonia 2017   x2  . PONV (postoperative nausea and vomiting)    used patch  . Sleep apnea    recommended CPAP  . Ulcer disease   . Urine incontinence    HOSPITAL COURSE:  60 year old female with a known history of severe depression, chronic low back pain, hypothyroidism, hypertension is admitted for pneumonia due to COVID-19 infection.  Acute hypoxic respiratory failure due to pneumonia from COVID-19 virus Chest x-ray shows multifocal pneumonia. Completed course of remdesivir She will need 2 L oxygen at discharge.  Being discharged on prednisone taper to complete course of steroid  Acute kidney injury Improved with  hydration Thought to be from ATN due to hypotension  Diarrhea Negative C. Difficile.  Likely viral - resolved  Hypokalemia/hypomagnesemia Repleted  Hypothyroidism Continue Synthroid  Anxiety and depression Continue venlafaxine, Seroquel  Transaminitis  This could be due to viral infection.  Will need outpatient monitoring to decide if she would need to continue holding statin.  Her statin was held while in the hospital  GERD Continue PPI  Chronic low back pain Patient is mostly bedbound status per patient's husband.  Continue pain medicine as needed   DISCHARGE CONDITIONS:  Stable CONSULTS OBTAINED:   DRUG ALLERGIES:   Allergies  Allergen Reactions  . Fentanyl Other (See Comments)    Other reaction(s): Headache, Other (See Comments) headache SEVERE HEADACHE headache  . Sumatriptan Other (See Comments)    Other reaction(s): Headache Severe headache  . Doxycycline Other (See Comments)    Other reaction(s): Headache, Other (See Comments) REACTION: causes severe abd pain REACTION: causes severe abd pain REACTION: causes severe abd pain  . Montelukast Other (See Comments)    SEVERE HEADACHE  . Oxymorphone Other (See Comments)    REACTION: severe headaches  . Benzoin Rash  . Buspirone Other (See Comments)    SEVERE HEADACHE  . Ciprofloxacin Nausea Only  . Conjugated Estrogens Other (See Comments)    headaches  . Cymbalta [Duloxetine Hcl] Other (See Comments)    Headache  . Duloxetine  Other (See Comments)    Headache  . Erythromycin Ethylsuccinate Other (See Comments)    Stomach ache  . Estradiol Other (See Comments)    headache  . Metronidazole Other (See Comments)    abd pain  . Mirtazapine Other (See Comments)    headache   DISCHARGE MEDICATIONS:   Allergies as of 09/07/2020      Reactions   Fentanyl Other (See Comments)   Other reaction(s): Headache, Other (See Comments) headache SEVERE HEADACHE headache   Sumatriptan Other (See  Comments)   Other reaction(s): Headache Severe headache   Doxycycline Other (See Comments)   Other reaction(s): Headache, Other (See Comments) REACTION: causes severe abd pain REACTION: causes severe abd pain REACTION: causes severe abd pain   Montelukast Other (See Comments)   SEVERE HEADACHE   Oxymorphone Other (See Comments)   REACTION: severe headaches   Benzoin Rash   Buspirone Other (See Comments)   SEVERE HEADACHE   Ciprofloxacin Nausea Only   Conjugated Estrogens Other (See Comments)   headaches   Cymbalta [duloxetine Hcl] Other (See Comments)   Headache   Duloxetine Other (See Comments)   Headache   Erythromycin Ethylsuccinate Other (See Comments)   Stomach ache   Estradiol Other (See Comments)   headache   Metronidazole Other (See Comments)   abd pain   Mirtazapine Other (See Comments)   headache      Medication List    TAKE these medications   albuterol (2.5 MG/3ML) 0.083% nebulizer solution Commonly known as: PROVENTIL Take 2.5 mg by nebulization every 4 (four) hours as needed for wheezing or shortness of breath.   albuterol 108 (90 Base) MCG/ACT inhaler Commonly known as: VENTOLIN HFA Inhale 2 puffs into the lungs every 4 (four) hours as needed for wheezing or shortness of breath.   amLODipine 5 MG tablet Commonly known as: NORVASC TAKE 1 TABLET BY MOUTH EVERY DAY   ascorbic acid 500 MG tablet Commonly known as: VITAMIN C Take 1 tablet (500 mg total) by mouth daily.   bisacodyl 5 MG EC tablet Commonly known as: DULCOLAX Take 5 mg by mouth daily as needed for moderate constipation.   diazepam 5 MG tablet Commonly known as: VALIUM Take 5 mg by mouth 3 (three) times daily as needed for anxiety.   docusate sodium 100 MG capsule Commonly known as: COLACE Take 100 mg by mouth 2 (two) times daily.   Fish Oil 1200 MG Caps Take 1 capsule by mouth daily.   Gralise 600 MG Tabs Generic drug: Gabapentin (Once-Daily) Take 600 mg by mouth 3 (three)  times daily.   hydrochlorothiazide 25 MG tablet Commonly known as: HYDRODIURIL Take 25 mg by mouth daily.   metoprolol tartrate 50 MG tablet Commonly known as: LOPRESSOR TAKE 1/2 TABLET BY MOUTH TWICE A DAY (NEED OFFICE VISIT) What changed: See the new instructions.   multivitamin capsule Take 1 capsule by mouth daily.   omeprazole 40 MG capsule Commonly known as: PRILOSEC TAKE 1 CAPSULE (40 MG TOTAL) BY MOUTH AT BEDTIME.   Os-Cal Calcium + D3 500-200 MG-UNIT Tabs Generic drug: Calcium Carb-Cholecalciferol Take 2 tablets by mouth as directed.   Oxycodone HCl 10 MG Tabs Take 10 mg by mouth 3 (three) times daily as needed (breakthrough pain).   pravastatin 10 MG tablet Commonly known as: PRAVACHOL TAKE 1 TABLET (10 MG TOTAL) BY MOUTH DAILY. What changed:   how much to take  how to take this  when to take this  additional instructions  predniSONE 10 MG (21) Tbpk tablet Commonly known as: STERAPRED UNI-PAK 21 TAB Start 60 mg po daily, taper 10 mg once daily until done   QUEtiapine 50 MG tablet Commonly known as: SEROQUEL Take 1 tablet (50 mg total) by mouth at bedtime.   Tirosint 137 MCG Caps Generic drug: Levothyroxine Sodium TAKE 1 CAPSULE BY MOUTH DAILY BEFORE BREAKFAST. What changed: See the new instructions.   Trelegy Ellipta 100-62.5-25 MCG/INH Aepb Generic drug: Fluticasone-Umeclidin-Vilant Inhale 1 puff into the lungs daily.   venlafaxine XR 75 MG 24 hr capsule Commonly known as: EFFEXOR-XR Take 3 capsules in the morning What changed:   how much to take  how to take this  when to take this  additional instructions   Xtampza ER 36 MG C12a Generic drug: oxyCODONE ER Take 36 mg by mouth every 12 (twelve) hours.   zinc sulfate 220 (50 Zn) MG capsule Take 1 capsule (220 mg total) by mouth daily.   zolpidem 10 MG tablet Commonly known as: AMBIEN Take 10 mg by mouth at bedtime as needed for sleep.      DISCHARGE INSTRUCTIONS:   DIET:   Regular diet DISCHARGE CONDITION:  Stable ACTIVITY:  Activity as tolerated OXYGEN:  Home Oxygen: Yes.    Oxygen Delivery: 2 liters/min via Patient connected to nasal cannula oxygen DISCHARGE LOCATION:  home with home health RN, PT, OT  If you experience worsening of your admission symptoms, develop shortness of breath, life threatening emergency, suicidal or homicidal thoughts you must seek medical attention immediately by calling 911 or calling your MD immediately  if symptoms less severe.  You Must read complete instructions/literature along with all the possible adverse reactions/side effects for all the Medicines you take and that have been prescribed to you. Take any new Medicines after you have completely understood and accpet all the possible adverse reactions/side effects.   Please note  You were cared for by a hospitalist during your hospital stay. If you have any questions about your discharge medications or the care you received while you were in the hospital after you are discharged, you can call the unit and asked to speak with the hospitalist on call if the hospitalist that took care of you is not available. Once you are discharged, your primary care physician will handle any further medical issues. Please note that NO REFILLS for any discharge medications will be authorized once you are discharged, as it is imperative that you return to your primary care physician (or establish a relationship with a primary care physician if you do not have one) for your aftercare needs so that they can reassess your need for medications and monitor your lab values.    On the day of Discharge:  VITAL SIGNS:  Blood pressure (!) 114/54, pulse 95, temperature 98.4 F (36.9 C), temperature source Oral, resp. rate 19, height 5\' 7"  (1.702 m), weight 85.3 kg, SpO2 90 %. PHYSICAL EXAMINATION:  GENERAL:  60 y.o.-year-old patient lying in the bed with no acute distress.  EYES: Pupils equal, round,  reactive to light and accommodation. No scleral icterus. Extraocular muscles intact.  HEENT: Head atraumatic, normocephalic. Oropharynx and nasopharynx clear.  NECK:  Supple, no jugular venous distention. No thyroid enlargement, no tenderness.  LUNGS: Normal breath sounds bilaterally, no wheezing, rales,rhonchi or crepitation. No use of accessory muscles of respiration.  CARDIOVASCULAR: S1, S2 normal. No murmurs, rubs, or gallops.  ABDOMEN: Soft, non-tender, non-distended. Bowel sounds present. No organomegaly or mass.  EXTREMITIES: No pedal edema,  cyanosis, or clubbing.  NEUROLOGIC: Cranial nerves II through XII are intact. Muscle strength 5/5 in all extremities. Sensation intact. Gait not checked.  PSYCHIATRIC: The patient is alert and oriented x 3.  SKIN: No obvious rash, lesion, or ulcer.  DATA REVIEW:   CBC Recent Labs  Lab 09/07/20 0457  WBC 8.4  HGB 13.4  HCT 40.0  PLT 256    Chemistries  Recent Labs  Lab 09/07/20 0457  NA 145  K 3.2*  CL 103  CO2 30  GLUCOSE 98  BUN 24*  CREATININE 0.97  CALCIUM 8.6*  MG 1.7  AST 84*  ALT 53*  ALKPHOS 90  BILITOT 0.7     Outpatient follow-up  Follow-up Information    Einar Pheasant, MD. Go on 09/09/2020.   Specialty: Internal Medicine Why: virtual appointment at 11:30am; the link will go to the cell phone number  Contact information: [id number] [place]  [phone]               30 Day Unplanned Readmission Risk Score   Flowsheet Row ED to Hosp-Admission (Discharged) from 09/03/2020 in [facility] (1C)  30 Day Unplanned Readmission Risk Score (%) 26.19 Filed at 09/07/2020 1200     This score is the patient's risk of an unplanned readmission within 30 days of being discharged (0 -100%). The score is based on dignosis, age, lab data, medications, orders, and past utilization.   Low:  0-14.9   Medium: 15-21.9   High: 22-29.9   Extreme: 30 and  above         Management plans discussed with the patient, family and they are in agreement.  CODE STATUS: Prior   TOTAL TIME TAKING CARE OF THIS PATIENT: 45 minutes.    Max Sane M.D on 09/09/2020 at 5:02 PM  [facility]   CC: Primary care physician; Einar Pheasant, MD   Note: This dictation was prepared with Dragon dictation along with smaller phrase technology. Any transcriptional errors that result from this process are unintentional.

## 2020-09-10 ENCOUNTER — Encounter: Payer: Self-pay | Admitting: Internal Medicine

## 2020-09-10 DIAGNOSIS — R0902 Hypoxemia: Secondary | ICD-10-CM | POA: Insufficient documentation

## 2020-09-10 NOTE — Assessment & Plan Note (Signed)
Elevated liver test in hospital. Presumed to be related to covid.  Will have home health draw f/u liver panel to reassess.  Further w/up pending results.

## 2020-09-10 NOTE — Assessment & Plan Note (Signed)
Recently admitted with covid pneumonia - hypoxia and AKI.  Diarrhea resolved.  Appetite has improved some.  Trying to stay hydrated.  Still requiring oxygen.  Continue oxygen.  Home health to monitor O2 sats and hopefully will be able to wean oxygen in the near future.  Continue steroid taper.  Continue trelegy.  Discussed need for f/u cxr in 6-8 weeks.

## 2020-09-10 NOTE — Assessment & Plan Note (Signed)
Requiring oxygen.  Will have home health monitor oxygen saturation.

## 2020-09-10 NOTE — Assessment & Plan Note (Addendum)
Recent admission for covid pneumonia.  Treated with remdesivir.  Continues on steroid taper.  Continue trelegy.  On oxygen.  Home health - to monitor O2 sats and hopefully will be able to wean oxygen in the near future.  No acute sob or wheezing currently.

## 2020-09-10 NOTE — Assessment & Plan Note (Signed)
Received IVFs in hospital.  Continue to encourage increased fluid intake.

## 2020-09-10 NOTE — Assessment & Plan Note (Signed)
Has been followed by Dr Toy Care.  Has been on abilify and effexor.  Has been stable.

## 2020-09-10 NOTE — Assessment & Plan Note (Signed)
Chronic. Followed by pain clinic.

## 2020-09-10 NOTE — Assessment & Plan Note (Signed)
Recent admission as outlined.  Given IV remdesivir and decadron. Completing steroid taper.  On oxygen.  Have home health monitor O2 sats.  Arrange PT.  Continue to stay hydrated.  Will need f/u cxr in 6-8 weeks.  Schedule f/u appt next week to reassess.  Call with update.

## 2020-09-10 NOTE — Telephone Encounter (Signed)
Order faxed to home health

## 2020-09-10 NOTE — Assessment & Plan Note (Signed)
Blood pressure has been doing well on amlodipine, hctz and metoprolol.  Home health nurse coming today.  Will have them check BP.

## 2020-09-10 NOTE — Assessment & Plan Note (Signed)
On potassium supplements - oral now.  Was given potassium in hospital.  Labs reviewed.  Will have home health draw f/u potassium.

## 2020-09-16 ENCOUNTER — Telehealth: Payer: Self-pay

## 2020-09-16 NOTE — Telephone Encounter (Signed)
Need to know if she is still requiring oxygen?  Is she eating, sob, etc?  If any ongoing issues, needs to keep appt.

## 2020-09-16 NOTE — Telephone Encounter (Signed)
Pt wanted to cancel her appt for tomorrow. She said to let Dr. Nicki Reaper know she is fine but since her husband had to have surgery yesterday she is just all out of sort. She wants to just keep appointment on 10/06/20 if that is ok? If she needs to reschedule please give her a call.

## 2020-09-16 NOTE — Telephone Encounter (Signed)
LMTCB

## 2020-09-16 NOTE — Telephone Encounter (Signed)
FYI

## 2020-09-17 ENCOUNTER — Telehealth: Payer: Medicare Other | Admitting: Internal Medicine

## 2020-09-18 ENCOUNTER — Encounter: Payer: Self-pay | Admitting: Internal Medicine

## 2020-09-18 NOTE — Telephone Encounter (Signed)
LMTCB. Pt has appt on 2/22

## 2020-09-18 NOTE — Telephone Encounter (Signed)
LMTCB

## 2020-09-21 DIAGNOSIS — G4700 Insomnia, unspecified: Secondary | ICD-10-CM | POA: Diagnosis not present

## 2020-09-21 DIAGNOSIS — M62830 Muscle spasm of back: Secondary | ICD-10-CM | POA: Diagnosis not present

## 2020-09-21 DIAGNOSIS — G894 Chronic pain syndrome: Secondary | ICD-10-CM | POA: Diagnosis not present

## 2020-09-21 DIAGNOSIS — M47817 Spondylosis without myelopathy or radiculopathy, lumbosacral region: Secondary | ICD-10-CM | POA: Diagnosis not present

## 2020-09-23 DIAGNOSIS — J1282 Pneumonia due to coronavirus disease 2019: Secondary | ICD-10-CM | POA: Diagnosis not present

## 2020-09-23 DIAGNOSIS — U071 COVID-19: Secondary | ICD-10-CM | POA: Diagnosis not present

## 2020-09-23 DIAGNOSIS — G8929 Other chronic pain: Secondary | ICD-10-CM | POA: Diagnosis not present

## 2020-09-23 DIAGNOSIS — J9601 Acute respiratory failure with hypoxia: Secondary | ICD-10-CM | POA: Diagnosis not present

## 2020-09-23 DIAGNOSIS — M545 Low back pain, unspecified: Secondary | ICD-10-CM | POA: Diagnosis not present

## 2020-09-23 DIAGNOSIS — I10 Essential (primary) hypertension: Secondary | ICD-10-CM | POA: Diagnosis not present

## 2020-09-24 DIAGNOSIS — U071 COVID-19: Secondary | ICD-10-CM | POA: Diagnosis not present

## 2020-09-25 ENCOUNTER — Other Ambulatory Visit: Payer: Self-pay | Admitting: Internal Medicine

## 2020-09-25 ENCOUNTER — Other Ambulatory Visit (HOSPITAL_COMMUNITY): Payer: Self-pay | Admitting: Psychiatry

## 2020-10-05 ENCOUNTER — Other Ambulatory Visit: Payer: Self-pay | Admitting: Internal Medicine

## 2020-10-05 ENCOUNTER — Other Ambulatory Visit: Payer: Self-pay | Admitting: Pulmonary Disease

## 2020-10-06 ENCOUNTER — Telehealth: Payer: Self-pay | Admitting: Internal Medicine

## 2020-10-06 ENCOUNTER — Ambulatory Visit: Payer: Medicare Other | Admitting: Internal Medicine

## 2020-10-06 NOTE — Telephone Encounter (Signed)
LMTCB. Patient will have to leave urine sample and do appt with Dr Nicki Reaper. We cannot call in abx with out having this

## 2020-10-06 NOTE — Telephone Encounter (Signed)
Pt called she thinks that she has a UTI and wanted something called in. She had to cancel her appt today because her husband had an appt

## 2020-10-07 ENCOUNTER — Encounter: Payer: Self-pay | Admitting: Internal Medicine

## 2020-10-07 ENCOUNTER — Telehealth: Payer: Self-pay | Admitting: Internal Medicine

## 2020-10-07 DIAGNOSIS — J1282 Pneumonia due to coronavirus disease 2019: Secondary | ICD-10-CM | POA: Diagnosis not present

## 2020-10-07 DIAGNOSIS — N390 Urinary tract infection, site not specified: Secondary | ICD-10-CM | POA: Diagnosis not present

## 2020-10-07 DIAGNOSIS — M545 Low back pain, unspecified: Secondary | ICD-10-CM | POA: Diagnosis not present

## 2020-10-07 DIAGNOSIS — U071 COVID-19: Secondary | ICD-10-CM | POA: Diagnosis not present

## 2020-10-07 DIAGNOSIS — G8929 Other chronic pain: Secondary | ICD-10-CM | POA: Diagnosis not present

## 2020-10-07 DIAGNOSIS — J9601 Acute respiratory failure with hypoxia: Secondary | ICD-10-CM | POA: Diagnosis not present

## 2020-10-07 DIAGNOSIS — I10 Essential (primary) hypertension: Secondary | ICD-10-CM | POA: Diagnosis not present

## 2020-10-07 NOTE — Telephone Encounter (Signed)
Luellen Pucker [phone] from [facility] called in about patient need verbal orders for a UTI she is at the patient 's home now

## 2020-10-07 NOTE — Telephone Encounter (Signed)
Gave orders for urine. While on the phone with nurse she stated that patient is now off of her oxygen. She is staying at about 97-98% on room air.

## 2020-10-07 NOTE — Telephone Encounter (Signed)
LM to schedule pt appt with Dr Nicki Reaper to discuss urine results.

## 2020-10-08 ENCOUNTER — Encounter: Payer: Self-pay | Admitting: Internal Medicine

## 2020-10-08 ENCOUNTER — Telehealth (INDEPENDENT_AMBULATORY_CARE_PROVIDER_SITE_OTHER): Payer: Medicare Other | Admitting: Internal Medicine

## 2020-10-08 DIAGNOSIS — J1282 Pneumonia due to coronavirus disease 2019: Secondary | ICD-10-CM

## 2020-10-08 DIAGNOSIS — J4520 Mild intermittent asthma, uncomplicated: Secondary | ICD-10-CM

## 2020-10-08 DIAGNOSIS — U071 COVID-19: Secondary | ICD-10-CM | POA: Diagnosis not present

## 2020-10-08 DIAGNOSIS — F3341 Major depressive disorder, recurrent, in partial remission: Secondary | ICD-10-CM

## 2020-10-08 DIAGNOSIS — K219 Gastro-esophageal reflux disease without esophagitis: Secondary | ICD-10-CM

## 2020-10-08 DIAGNOSIS — I10 Essential (primary) hypertension: Secondary | ICD-10-CM

## 2020-10-08 DIAGNOSIS — R300 Dysuria: Secondary | ICD-10-CM | POA: Diagnosis not present

## 2020-10-08 MED ORDER — CEFDINIR 300 MG PO CAPS: 300.0000 mg | ORAL_CAPSULE | Freq: Two times a day (BID) | ORAL | 0 refills | Status: DC

## 2020-10-08 NOTE — Telephone Encounter (Signed)
LMTCB

## 2020-10-08 NOTE — Telephone Encounter (Signed)
Home health nurse stated that urine has not resulted yet but she will call them tomorrow morning to follow up and send me results.

## 2020-10-08 NOTE — Progress Notes (Signed)
Virtual Visit via video Note  This visit type was conducted due to national recommendations for restrictions regarding the COVID-19 pandemic (e.g. social distancing).  This format is felt to be most appropriate for this patient at this time.  All issues noted in this document were discussed and addressed.  No physical exam was performed (except for noted visual exam findings with Video Visits).   I connected with [name] by a video enabled telemedicine application and verified that I am speaking with the correct person using two identifiers. Location patient: home Location provider: work Persons participating in the virtual visit: patient, provider  The limitations, risks, security and privacy concerns of performing an evaluation and management service by video and the availability of in person appointments have been discussed.  It has also been discussed with the patient that there may be a patient responsible charge related to this service. The patient expressed understanding and agreed to proceed.   Reason for visit: work in appt  HPI: Work in appt for possible UTI.  Has noticed increased urinary frequency and dysuria.  Discomfort with end urination.  Describes some bladder spasm and urinary incontinence.  Feels similar to previous bladder infections.  No fever.  No vomiting.  Taking AZO.  No diarrhea.  She was recently hospitalized with covid.  Breathing much better.  She had weaned off continuous oxygen.  Using prn.  Has a history of asthma. Noticed over the last 24 hours some increasd cough.  Some wheezing.  Noticed being a little more winded with exertion.  Is not working with PT.  Has not been getting up moving around much, but has been walking some.  Noticed pulse 87% with ambulation earlier.  Wore oxygen.  Off oxygen now.  No increased cough during visit.  Was out of trelegy for short period (maybe only 1-2 days).  Back on now.  Has rescue inhaler.  Home health nurse listended to  lungs yesterday - clear.       ROS: See pertinent positives and negatives per HPI.  Past Medical History:  Diagnosis Date  . Allergic rhinitis   . Anxiety   . Arthralgia   . Asthma   . Bronchitis, chronic ([place])   . Chronic back pain   . Collagenous colitis    followed by Dr Tiffany Kocher  . Depression   . Diplopia 11/15/2013  . Dysphagia   . Endometriosis   . Fibrocystic breast disease   . GERD (gastroesophageal reflux disease)   . History of colon polyps   . Hyperlipidemia   . Hypertension   . Hypokalemia   . IBS (irritable bowel syndrome)   . Memory difficulty 11/15/2013  . Migraine headache   . Pneumonia 2017   x2  . PONV (postoperative nausea and vomiting)    used patch  . Sleep apnea    recommended CPAP  . Ulcer disease   . Urine incontinence     Past Surgical History:  Procedure Laterality Date  . ABDOMINAL HYSTERECTOMY     endometriosis  . BACK SURGERY     x6  . BREAST BIOPSY Right   . COLONOSCOPY WITH PROPOFOL N/A 06/25/2015   Procedure: COLONOSCOPY WITH PROPOFOL;  Surgeon: Manya Silvas, MD;  Location: [facility] ENDOSCOPY;  Service: Endoscopy;  Laterality: N/A;  . DILATION AND CURETTAGE OF UTERUS     and hysteroscopy  . ESOPHAGOGASTRODUODENOSCOPY N/A 06/25/2015   Procedure: ESOPHAGOGASTRODUODENOSCOPY (EGD);  Surgeon: Manya Silvas, MD;  Location: [facility] ENDOSCOPY;  Service: Endoscopy;  Laterality: N/A;  . insertion of permanent spinal cord stimulator    . insertion of trial spinal cord stimulator    . LUMBAR DISC SURGERY    . LUMBAR FUSION  4/05 and 8/07  . LUMBAR MICRODISCECTOMY  03/28/03   L4-5 L5  . MOUTH SURGERY    . NASAL SEPTOPLASTY W/ TURBINOPLASTY    . removal of spinal cord stimulator    . SEPTOPLASTY     with reduction of turbinates  . THYROIDECTOMY N/A 06/06/2016   Procedure: THYROIDECTOMY;  Surgeon: Robert Bellow, MD;  Location: [facility] ORS;  Service: General;  Laterality: N/A;    Family History  Problem Relation Age of Onset  . Allergies  Mother   . Hypertension Mother   . Arthritis Mother   . Hyperlipidemia Mother   . Stroke Mother   . Hypertension Father   . Arthritis Father   . Diabetes Father   . Hypertension Brother   . Diabetes Brother   . Arthritis Maternal Grandmother   . Hyperlipidemia Maternal Grandmother   . Hyperlipidemia Maternal Grandfather   . Arthritis Paternal Grandmother   . Arthritis Paternal Grandfather   . Asthma Other        several family member  . Leukemia Other        uncle  . Lymphoma Maternal Aunt        non hodgkins    SOCIAL HX: reviewed.    Current Outpatient Medications:  .  albuterol (VENTOLIN HFA) 108 (90 Base) MCG/ACT inhaler, Inhale 2 puffs into the lungs every 4 (four) hours as needed for wheezing or shortness of breath., Disp: 1 Inhaler, Rfl: 3 .  amLODipine (NORVASC) 5 MG tablet, TAKE 1 TABLET BY MOUTH EVERY DAY, Disp: 90 tablet, Rfl: 1 .  ascorbic acid (VITAMIN C) 500 MG tablet, Take 1 tablet (500 mg total) by mouth daily., Disp: 30 tablet, Rfl: 0 .  bisacodyl (DULCOLAX) 5 MG EC tablet, Take 5 mg by mouth daily as needed for moderate constipation., Disp: , Rfl:  .  cefdinir (OMNICEF) 300 MG capsule, Take 1 capsule (300 mg total) by mouth 2 (two) times daily., Disp: 14 capsule, Rfl: 0 .  diazepam (VALIUM) 5 MG tablet, Take 5 mg by mouth 3 (three) times daily as needed for anxiety., Disp: , Rfl:  .  docusate sodium (COLACE) 100 MG capsule, Take 100 mg by mouth 2 (two) times daily., Disp: , Rfl:  .  esomeprazole (NEXIUM) 20 MG capsule, Take 20 mg by mouth daily at 12 noon., Disp: , Rfl:  .  GRALISE 600 MG TABS, Take 600 mg by mouth 3 (three) times daily., Disp: , Rfl:  .  hydrochlorothiazide (HYDRODIURIL) 25 MG tablet, Take 25 mg by mouth daily., Disp: , Rfl: 11 .  metoprolol tartrate (LOPRESSOR) 50 MG tablet, TAKE 1/2 TABLET BY MOUTH TWICE A DAY (NEED OFFICE VISIT), Disp: 90 tablet, Rfl: 0 .  Multiple Vitamin (MULTIVITAMIN) capsule, Take 1 capsule by mouth daily., Disp: , Rfl:   .  Omega-3 Fatty Acids (FISH OIL) 1200 MG CAPS, Take 1 capsule by mouth daily., Disp: , Rfl:  .  Oxycodone HCl 10 MG TABS, Take 10 mg by mouth 3 (three) times daily as needed (breakthrough pain)., Disp: , Rfl:  .  pravastatin (PRAVACHOL) 10 MG tablet, TAKE 1 TABLET (10 MG TOTAL) BY MOUTH DAILY. (Patient taking differently: Take 10 mg by mouth daily.), Disp: 90 tablet, Rfl: 1 .  TIROSINT 137 MCG CAPS, TAKE 1 CAPSULE BY MOUTH  DAILY BEFORE BREAKFAST., Disp: 30 capsule, Rfl: 2 .  TRELEGY ELLIPTA 100-62.5-25 MCG/INH AEPB, TAKE 1 PUFF BY MOUTH EVERY DAY, Disp: 60 each, Rfl: 0 .  venlafaxine XR (EFFEXOR-XR) 75 MG 24 hr capsule, Take 3 capsules in the morning (Patient taking differently: Take 225 mg by mouth every morning.), Disp: 270 capsule, Rfl: 0 .  XTAMPZA ER 36 MG C12A, Take 36 mg by mouth every 12 (twelve) hours., Disp: , Rfl: 0 .  zinc sulfate 220 (50 Zn) MG capsule, Take 1 capsule (220 mg total) by mouth daily., Disp: 30 capsule, Rfl: 0 .  zolpidem (AMBIEN) 10 MG tablet, Take 10 mg by mouth at bedtime as needed for sleep., Disp: , Rfl:  .  albuterol (PROVENTIL) (2.5 MG/3ML) 0.083% nebulizer solution, Take 2.5 mg by nebulization every 4 (four) hours as needed for wheezing or shortness of breath. (Patient not taking: Reported on 10/08/2020), Disp: , Rfl:   EXAM:  VITALS per patient if applicable: pulse ox 81% room air  GENERAL: alert, oriented, appears well and in no acute distress  HEENT: atraumatic, conjunttiva clear, no obvious abnormalities on inspection of external nose and ears  NECK: normal movements of the head and neck  LUNGS: on inspection no signs of respiratory distress, breathing rate appears normal, no obvious gross SOB, gasping or wheezing.  No increased cough or difficulty breathing with forced expiration of air.   CV: no obvious cyanosis  PSYCH/NEURO: pleasant and cooperative, no obvious depression or anxiety, speech and thought processing grossly intact  ASSESSMENT AND  PLAN:  Discussed the following assessment and plan:  Problem List Items Addressed This Visit    Asthma    Recent admission with covid pneumonia.  Treated with remdesivir and steroids.  Breathing is much better overall.  Some increased cough over the last 1-2 days.  Treating urine with abx. omnicef shoulld cover.  She is back on trelegy.  Take regularly.  Has rescue inhaler if needed.  Hold prednisone at this time. Oxygen if up and moving around.  Follow closely.  Call with update.        Benign hypertension    Blood pressure has been doing well.  Continue amlodipine.       Dysuria    Symptome as outlined.  Appear to be c/w UTI.  She feels symptoms are similar to previous infections.  Home health collected urine.  Sent to [facility]. No results to date.  Given symptoms and progression of symptoms, will cover with abx.  Will treat with omnicef given respiratory symptoms as well - should cover both.  Stay hydrated.  She is eating and drinking.  Probiotics as directed.  Follow.       GERD    No acid reflux symptoms reported.  Continue nexium.       Relevant Medications   esomeprazole (NEXIUM) 20 MG capsule   MDD (major depressive disorder), recurrent, in partial remission ([place])    Followed by psychiatry.  Appears to be stable - on effexor and abilify.  Continue follow up with psychiatry.       Pneumonia due to COVID-19 virus    Recent admission as outlined.  Treat current symptoms as outlined.  Will need f/u cxr to confirm clearance.        Relevant Medications   cefdinir (OMNICEF) 300 MG capsule       I discussed the assessment and treatment plan with the patient. The patient was provided an opportunity to ask questions and all were  answered. The patient agreed with the plan and demonstrated an understanding of the instructions.   The patient was advised to call back or seek an in-person evaluation if the symptoms worsen or if the condition fails to improve as  anticipated.    Einar Pheasant, MD

## 2020-10-09 ENCOUNTER — Encounter: Payer: Self-pay | Admitting: Internal Medicine

## 2020-10-09 DIAGNOSIS — R300 Dysuria: Secondary | ICD-10-CM | POA: Insufficient documentation

## 2020-10-09 NOTE — Assessment & Plan Note (Signed)
No acid reflux symptoms reported.  Continue nexium.

## 2020-10-09 NOTE — Assessment & Plan Note (Signed)
Recent admission with covid pneumonia.  Treated with remdesivir and steroids.  Breathing is much better overall.  Some increased cough over the last 1-2 days.  Treating urine with abx. omnicef shoulld cover.  She is back on trelegy.  Take regularly.  Has rescue inhaler if needed.  Hold prednisone at this time. Oxygen if up and moving around.  Follow closely.  Call with update.

## 2020-10-09 NOTE — Assessment & Plan Note (Signed)
Symptome as outlined.  Appear to be c/w UTI.  She feels symptoms are similar to previous infections.  Home health collected urine.  Sent to [facility]. No results to date.  Given symptoms and progression of symptoms, will cover with abx.  Will treat with omnicef given respiratory symptoms as well - should cover both.  Stay hydrated.  She is eating and drinking.  Probiotics as directed.  Follow.

## 2020-10-09 NOTE — Telephone Encounter (Signed)
Patient confirmed she is doing ok. I received the initial UA. Am not able to abstract but it was negative for everything except for leukocytes. Still waiting for culture results

## 2020-10-09 NOTE — Assessment & Plan Note (Signed)
Recent admission as outlined.  Treat current symptoms as outlined.  Will need f/u cxr to confirm clearance.

## 2020-10-09 NOTE — Assessment & Plan Note (Signed)
Followed by psychiatry.  Appears to be stable - on effexor and abilify.  Continue follow up with psychiatry.

## 2020-10-09 NOTE — Assessment & Plan Note (Signed)
Blood pressure has been doing well.  Continue amlodipine.

## 2020-10-09 NOTE — Telephone Encounter (Signed)
Spoke to Gerty. The person she spoke with at [facility] this morning stated that he did not see where UA was ran. Just culture. He was going to run on STAT and should have results within 4 hours. Luellen Pucker is supposed to call me back.

## 2020-10-10 NOTE — Telephone Encounter (Signed)
On omnicef as discussed.  Wait for culture results.  Hold until receive.

## 2020-10-12 NOTE — Telephone Encounter (Signed)
Holding for culture

## 2020-10-13 ENCOUNTER — Other Ambulatory Visit: Payer: Self-pay

## 2020-10-13 ENCOUNTER — Telehealth (HOSPITAL_COMMUNITY): Payer: Medicare Other | Admitting: Psychiatry

## 2020-10-13 NOTE — Telephone Encounter (Signed)
LMTCB

## 2020-10-13 NOTE — Telephone Encounter (Signed)
Culture received and in folder. Did not reveal infection. <10,000 colonies

## 2020-10-13 NOTE — Telephone Encounter (Signed)
Urine culture - placed in box.  Notify pt culture negative.  She was having issues with cough/congestion as well.  Please confirm doing ok.

## 2020-10-14 ENCOUNTER — Encounter: Payer: Self-pay | Admitting: Internal Medicine

## 2020-10-14 NOTE — Telephone Encounter (Signed)
Pt sent my chart. Abx helped with cough/congestion. Sees [place] on the 24th.

## 2020-10-20 DIAGNOSIS — M47817 Spondylosis without myelopathy or radiculopathy, lumbosacral region: Secondary | ICD-10-CM | POA: Diagnosis not present

## 2020-10-20 DIAGNOSIS — M62830 Muscle spasm of back: Secondary | ICD-10-CM | POA: Diagnosis not present

## 2020-10-20 DIAGNOSIS — G4700 Insomnia, unspecified: Secondary | ICD-10-CM | POA: Diagnosis not present

## 2020-10-20 DIAGNOSIS — G894 Chronic pain syndrome: Secondary | ICD-10-CM | POA: Diagnosis not present

## 2020-10-29 ENCOUNTER — Telehealth (HOSPITAL_COMMUNITY): Payer: Self-pay | Admitting: *Deleted

## 2020-10-29 DIAGNOSIS — F411 Generalized anxiety disorder: Secondary | ICD-10-CM

## 2020-10-29 DIAGNOSIS — F3341 Major depressive disorder, recurrent, in partial remission: Secondary | ICD-10-CM

## 2020-10-29 MED ORDER — VENLAFAXINE HCL ER 75 MG PO CP24: ORAL_CAPSULE | ORAL | 0 refills | Status: DC

## 2020-10-29 NOTE — Addendum Note (Signed)
Addended by: [place] Crane on: 10/29/2020 12:29 PM   Modules accepted: Orders

## 2020-10-29 NOTE — Telephone Encounter (Signed)
Patient's husband called requested refill  venlafaxine XR (EFFEXOR-XR) 75 MG 24 hr capsule for souse states she has 2 left next appt 4/26

## 2020-10-29 NOTE — Telephone Encounter (Signed)
Done

## 2020-11-05 ENCOUNTER — Telehealth (INDEPENDENT_AMBULATORY_CARE_PROVIDER_SITE_OTHER): Payer: Medicare Other | Admitting: Internal Medicine

## 2020-11-05 ENCOUNTER — Encounter: Payer: Self-pay | Admitting: Internal Medicine

## 2020-11-05 VITALS — Wt 205.0 lb

## 2020-11-05 DIAGNOSIS — I10 Essential (primary) hypertension: Secondary | ICD-10-CM

## 2020-11-05 DIAGNOSIS — K219 Gastro-esophageal reflux disease without esophagitis: Secondary | ICD-10-CM

## 2020-11-05 DIAGNOSIS — R1310 Dysphagia, unspecified: Secondary | ICD-10-CM | POA: Diagnosis not present

## 2020-11-05 DIAGNOSIS — E7800 Pure hypercholesterolemia, unspecified: Secondary | ICD-10-CM | POA: Diagnosis not present

## 2020-11-05 DIAGNOSIS — Z1211 Encounter for screening for malignant neoplasm of colon: Secondary | ICD-10-CM | POA: Diagnosis not present

## 2020-11-05 DIAGNOSIS — J4520 Mild intermittent asthma, uncomplicated: Secondary | ICD-10-CM

## 2020-11-05 DIAGNOSIS — U071 COVID-19: Secondary | ICD-10-CM

## 2020-11-05 DIAGNOSIS — Z1231 Encounter for screening mammogram for malignant neoplasm of breast: Secondary | ICD-10-CM | POA: Diagnosis not present

## 2020-11-05 DIAGNOSIS — J1282 Pneumonia due to coronavirus disease 2019: Secondary | ICD-10-CM | POA: Diagnosis not present

## 2020-11-05 DIAGNOSIS — Z86010 Personal history of colonic polyps: Secondary | ICD-10-CM

## 2020-11-05 DIAGNOSIS — F3341 Major depressive disorder, recurrent, in partial remission: Secondary | ICD-10-CM

## 2020-11-05 NOTE — Progress Notes (Signed)
Patient ID: [name], female   DOB: 1960-11-13, 60 y.o.   MRN: [id number]   Virtual Visit via telephone Note  This visit type was conducted due to national recommendations for restrictions regarding the COVID-19 pandemic (e.g. social distancing).  This format is felt to be most appropriate for this patient at this time.  All issues noted in this document were discussed and addressed.  No physical exam was performed (except for noted visual exam findings with Video Visits).   I connected with [name] by telephone and verified that I am speaking with the correct person using two identifiers. Location patient: home Location provider: work Persons participating in the telephone visit: patient, provider  The limitations, risks, security and privacy concerns of performing an evaluation and management service by telephone and the availability of in person appointments have been discussed.  It has also been discussed with the patient that there may be a patient responsible charge related to this service. The patient expressed understanding and agreed to proceed.   Reason for visit: scheduled follow up.   HPI: Recently hospitalized with covid.  Doing better.  Breathing better.  Not requiring oxygen.  Pulse ox 96-97% now.  She is walking and moving around more.  No chest pain.  No increased cough.  Eating.  No nausea or vomiting. Does report noticing some issues with swallowing - dysphagia.  Discussed eating slow and taking small bites. Does report constipation.  Taking laxative and stool softener.  Blood pressure doing well.  Last check 11778.     ROS: See pertinent positives and negatives per HPI.  Past Medical History:  Diagnosis Date  . Allergic rhinitis   . Anxiety   . Arthralgia   . Asthma   . Bronchitis, chronic ([place])   . Chronic back pain   . Collagenous colitis    followed by Dr Tiffany Kocher  . Depression   . Diplopia 11/15/2013  . Dysphagia   . Endometriosis   . Fibrocystic  breast disease   . GERD (gastroesophageal reflux disease)   . History of colon polyps   . Hyperlipidemia   . Hypertension   . Hypokalemia   . IBS (irritable bowel syndrome)   . Memory difficulty 11/15/2013  . Migraine headache   . Pneumonia 2017   x2  . PONV (postoperative nausea and vomiting)    used patch  . Sleep apnea    recommended CPAP  . Ulcer disease   . Urine incontinence     Past Surgical History:  Procedure Laterality Date  . ABDOMINAL HYSTERECTOMY     endometriosis  . BACK SURGERY     x6  . BREAST BIOPSY Right   . COLONOSCOPY WITH PROPOFOL N/A 06/25/2015   Procedure: COLONOSCOPY WITH PROPOFOL;  Surgeon: Manya Silvas, MD;  Location: [facility] ENDOSCOPY;  Service: Endoscopy;  Laterality: N/A;  . DILATION AND CURETTAGE OF UTERUS     and hysteroscopy  . ESOPHAGOGASTRODUODENOSCOPY N/A 06/25/2015   Procedure: ESOPHAGOGASTRODUODENOSCOPY (EGD);  Surgeon: Manya Silvas, MD;  Location: [facility] ENDOSCOPY;  Service: Endoscopy;  Laterality: N/A;  . insertion of permanent spinal cord stimulator    . insertion of trial spinal cord stimulator    . LUMBAR DISC SURGERY    . LUMBAR FUSION  4/05 and 8/07  . LUMBAR MICRODISCECTOMY  03/28/03   L4-5 L5  . MOUTH SURGERY    . NASAL SEPTOPLASTY W/ TURBINOPLASTY    . removal of spinal cord stimulator    . SEPTOPLASTY  with reduction of turbinates  . THYROIDECTOMY N/A 06/06/2016   Procedure: THYROIDECTOMY;  Surgeon: Robert Bellow, MD;  Location: [facility] ORS;  Service: General;  Laterality: N/A;    Family History  Problem Relation Age of Onset  . Allergies Mother   . Hypertension Mother   . Arthritis Mother   . Hyperlipidemia Mother   . Stroke Mother   . Hypertension Father   . Arthritis Father   . Diabetes Father   . Hypertension Brother   . Diabetes Brother   . Arthritis Maternal Grandmother   . Hyperlipidemia Maternal Grandmother   . Hyperlipidemia Maternal Grandfather   . Arthritis Paternal Grandmother   . Arthritis  Paternal Grandfather   . Asthma Other        several family member  . Leukemia Other        uncle  . Lymphoma Maternal Aunt        non hodgkins    SOCIAL HX: reviewed.    Current Outpatient Medications:  .  albuterol (PROVENTIL) (2.5 MG/3ML) 0.083% nebulizer solution, Take 2.5 mg by nebulization every 4 (four) hours as needed for wheezing or shortness of breath. (Patient not taking: Reported on 10/08/2020), Disp: , Rfl:  .  albuterol (VENTOLIN HFA) 108 (90 Base) MCG/ACT inhaler, Inhale 2 puffs into the lungs every 4 (four) hours as needed for wheezing or shortness of breath., Disp: 1 Inhaler, Rfl: 3 .  amLODipine (NORVASC) 5 MG tablet, TAKE 1 TABLET BY MOUTH EVERY DAY, Disp: 90 tablet, Rfl: 1 .  ascorbic acid (VITAMIN C) 500 MG tablet, Take 1 tablet (500 mg total) by mouth daily., Disp: 30 tablet, Rfl: 0 .  bisacodyl (DULCOLAX) 5 MG EC tablet, Take 5 mg by mouth daily as needed for moderate constipation., Disp: , Rfl:  .  cefdinir (OMNICEF) 300 MG capsule, Take 1 capsule (300 mg total) by mouth 2 (two) times daily., Disp: 14 capsule, Rfl: 0 .  diazepam (VALIUM) 5 MG tablet, Take 5 mg by mouth 3 (three) times daily as needed for anxiety., Disp: , Rfl:  .  docusate sodium (COLACE) 100 MG capsule, Take 100 mg by mouth 2 (two) times daily., Disp: , Rfl:  .  esomeprazole (NEXIUM) 20 MG capsule, Take 20 mg by mouth daily at 12 noon., Disp: , Rfl:  .  GRALISE 600 MG TABS, Take 600 mg by mouth 3 (three) times daily., Disp: , Rfl:  .  hydrochlorothiazide (HYDRODIURIL) 25 MG tablet, Take 25 mg by mouth daily., Disp: , Rfl: 11 .  metoprolol tartrate (LOPRESSOR) 50 MG tablet, TAKE 1/2 TABLET BY MOUTH TWICE A DAY (NEED OFFICE VISIT), Disp: 90 tablet, Rfl: 0 .  Multiple Vitamin (MULTIVITAMIN) capsule, Take 1 capsule by mouth daily., Disp: , Rfl:  .  Omega-3 Fatty Acids (FISH OIL) 1200 MG CAPS, Take 1 capsule by mouth daily., Disp: , Rfl:  .  Oxycodone HCl 10 MG TABS, Take 10 mg by mouth 3 (three) times  daily as needed (breakthrough pain)., Disp: , Rfl:  .  pravastatin (PRAVACHOL) 10 MG tablet, TAKE 1 TABLET (10 MG TOTAL) BY MOUTH DAILY. (Patient taking differently: Take 10 mg by mouth daily.), Disp: 90 tablet, Rfl: 1 .  TIROSINT 137 MCG CAPS, TAKE 1 CAPSULE BY MOUTH DAILY BEFORE BREAKFAST., Disp: 30 capsule, Rfl: 2 .  TRELEGY ELLIPTA 100-62.5-25 MCG/INH AEPB, TAKE 1 PUFF BY MOUTH EVERY DAY, Disp: 60 each, Rfl: 0 .  venlafaxine XR (EFFEXOR-XR) 75 MG 24 hr capsule, Take 3 capsules in  the morning, Disp: 270 capsule, Rfl: 0 .  XTAMPZA ER 36 MG C12A, Take 36 mg by mouth every 12 (twelve) hours., Disp: , Rfl: 0 .  zinc sulfate 220 (50 Zn) MG capsule, Take 1 capsule (220 mg total) by mouth daily., Disp: 30 capsule, Rfl: 0 .  zolpidem (AMBIEN) 10 MG tablet, Take 10 mg by mouth at bedtime as needed for sleep., Disp: , Rfl:   EXAM:  VITALS per patient if applicable: 604/54  GENERAL: alert. Sounds to be in no acute distress. Answering questions appropriately.  PSYCH/NEURO: pleasant and cooperative, no obvious depression or anxiety, speech and thought processing grossly intact  ASSESSMENT AND PLAN:  Discussed the following assessment and plan:  Problem List Items Addressed This Visit    Asthma    Breathing stable.  No increased sob. Much improved.  Off oxygen.  Follow.       Breast cancer screening    She will schedule mammogram.        Dysphagia    Reports dysphagia.  Discussed taking small bites and chewing food well.  Refer to GI for question of need for EGD.       Relevant Orders   Ambulatory referral to Gastroenterology   Essential hypertension, benign    Continue amlodipine, hctz and metoprolol.  Blood pressure doing well.  Follow pressures.  Follow metabolic panel.       GERD    Continues on nexium.  Reports dysphagia.  Due colonoscopy. Being referred for colonoscopy.  Question of need for EGD as well.  Follow.        Relevant Orders   Ambulatory referral to Gastroenterology    History of colonic polyps    Colonoscopy 06/2015 - tubular adenoma x 2.  Recommended f/u in 06/2020.  Due.  She request referral to Dr Haig Prophet.        Hypercholesterolemia    Low cholesterol diet and exercise.  Pravastatin.  Follow lipid panel and liver function tests.        Relevant Orders   Hepatic function panel   Lipid panel   Basic metabolic panel   MDD (major depressive disorder), recurrent, in partial remission ([place])    Followed by psychiatry.  Effexor.        Pneumonia due to COVID-19 virus    Recent covid infection.  Needs f/u cxr to confirm clearance. Discussed with her.  Arrange f/u cxr.       Relevant Orders   DG Chest 2 View    Other Visit Diagnoses    Colon cancer screening    -  Primary   Relevant Orders   Ambulatory referral to Gastroenterology       I discussed the assessment and treatment plan with the patient. The patient was provided an opportunity to ask questions and all were answered. The patient agreed with the plan and demonstrated an understanding of the instructions.   The patient was advised to call back or seek an in-person evaluation if the symptoms worsen or if the condition fails to improve as anticipated.  I provided 24 minutes of non-face-to-face time during this encounter.   Einar Pheasant, MD

## 2020-11-08 ENCOUNTER — Encounter: Payer: Self-pay | Admitting: Internal Medicine

## 2020-11-08 DIAGNOSIS — R1310 Dysphagia, unspecified: Secondary | ICD-10-CM | POA: Insufficient documentation

## 2020-11-08 NOTE — Assessment & Plan Note (Signed)
Followed by psychiatry.  Effexor.

## 2020-11-08 NOTE — Assessment & Plan Note (Signed)
Continues on nexium.  Reports dysphagia.  Due colonoscopy. Being referred for colonoscopy.  Question of need for EGD as well.  Follow.

## 2020-11-08 NOTE — Assessment & Plan Note (Signed)
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  Recommended f/u in 06/2020.  Due.  She request referral to Dr Haig Prophet.

## 2020-11-08 NOTE — Assessment & Plan Note (Signed)
Low cholesterol diet and exercise.  Pravastatin.  Follow lipid panel and liver function tests.   

## 2020-11-08 NOTE — Assessment & Plan Note (Signed)
Reports dysphagia.  Discussed taking small bites and chewing food well.  Refer to GI for question of need for EGD.  

## 2020-11-08 NOTE — Assessment & Plan Note (Signed)
Continue amlodipine, hctz and metoprolol.  Blood pressure doing well.  Follow pressures.  Follow metabolic panel.

## 2020-11-08 NOTE — Assessment & Plan Note (Signed)
Breathing stable.  No increased sob. Much improved.  Off oxygen.  Follow.

## 2020-11-08 NOTE — Assessment & Plan Note (Signed)
Recent covid infection.  Needs f/u cxr to confirm clearance. Discussed with her.  Arrange f/u cxr.

## 2020-11-08 NOTE — Assessment & Plan Note (Signed)
She will schedule mammogram.  

## 2020-11-17 DIAGNOSIS — G894 Chronic pain syndrome: Secondary | ICD-10-CM | POA: Diagnosis not present

## 2020-11-17 DIAGNOSIS — G4700 Insomnia, unspecified: Secondary | ICD-10-CM | POA: Diagnosis not present

## 2020-11-17 DIAGNOSIS — M47817 Spondylosis without myelopathy or radiculopathy, lumbosacral region: Secondary | ICD-10-CM | POA: Diagnosis not present

## 2020-11-17 DIAGNOSIS — M62830 Muscle spasm of back: Secondary | ICD-10-CM | POA: Diagnosis not present

## 2020-12-02 ENCOUNTER — Other Ambulatory Visit: Payer: Self-pay | Admitting: Internal Medicine

## 2020-12-03 ENCOUNTER — Telehealth: Payer: Self-pay | Admitting: Internal Medicine

## 2020-12-03 NOTE — Telephone Encounter (Signed)
Rejection Reason - Other - Patient cancelled appointment in 09-2019 and has not rescheduled yet" [name] said on Dec 03, 2020 3:48 PM  "Patient cancelled appointment and has not rescheduled." [name] said on Oct 05, 2020 9:19 AM  msg sent from [facility] rheumatology

## 2020-12-06 ENCOUNTER — Telehealth: Payer: Self-pay | Admitting: Internal Medicine

## 2020-12-06 DIAGNOSIS — R1310 Dysphagia, unspecified: Secondary | ICD-10-CM

## 2020-12-06 DIAGNOSIS — Z86010 Personal history of colonic polyps: Secondary | ICD-10-CM

## 2020-12-06 DIAGNOSIS — K219 Gastro-esophageal reflux disease without esophagitis: Secondary | ICD-10-CM

## 2020-12-06 NOTE — Telephone Encounter (Signed)
Order placed again for GI referral.  See office note.

## 2020-12-08 ENCOUNTER — Telehealth (HOSPITAL_COMMUNITY): Payer: Medicare Other | Admitting: Psychiatry

## 2020-12-08 ENCOUNTER — Telehealth (HOSPITAL_COMMUNITY): Payer: Self-pay | Admitting: Psychiatry

## 2020-12-08 ENCOUNTER — Other Ambulatory Visit: Payer: Self-pay

## 2020-12-08 NOTE — Telephone Encounter (Signed)
I called the patient on her home phone to speak to the patient for scheduled appointment today.  Her husband answered the phone and informed that he had called the clinic about an hour ago to inform that they will not be able to keep this appointment because patient is very sick and is vomiting in the bathroom right now.  He informed the patient has a gastrointestinal viral infection going on. He stated that he wanted to be scheduled appointment. Writer informed him that Probation officer is leaving the practice and that her care is being transferred to a provider in the [facility] psychiatry clinic.  Patient verbalized his understanding and wished the writer the best for the future. Writer informed him that office staff from the [facility] psychiatry clinic will be contacting him later today to schedule an appointment with Dr. Modesta Messing in the [place] clinic. Patient is only prescribed Effexor XR 225 mg daily by the writer.  She is no longer taking Rexulti due to affordability issues.  A prescription was sent by the writer on March 17 for 90 days.  She is good for the next 6 weeks.

## 2020-12-10 ENCOUNTER — Encounter: Payer: Self-pay | Admitting: Internal Medicine

## 2020-12-10 ENCOUNTER — Telehealth: Payer: Self-pay | Admitting: Internal Medicine

## 2020-12-10 ENCOUNTER — Other Ambulatory Visit: Payer: Self-pay

## 2020-12-10 ENCOUNTER — Telehealth (INDEPENDENT_AMBULATORY_CARE_PROVIDER_SITE_OTHER): Payer: Medicare Other | Admitting: Internal Medicine

## 2020-12-10 VITALS — Ht 67.0 in | Wt 199.0 lb

## 2020-12-10 DIAGNOSIS — R110 Nausea: Secondary | ICD-10-CM | POA: Diagnosis not present

## 2020-12-10 DIAGNOSIS — J45901 Unspecified asthma with (acute) exacerbation: Secondary | ICD-10-CM | POA: Diagnosis not present

## 2020-12-10 DIAGNOSIS — J0111 Acute recurrent frontal sinusitis: Secondary | ICD-10-CM

## 2020-12-10 DIAGNOSIS — N3000 Acute cystitis without hematuria: Secondary | ICD-10-CM

## 2020-12-10 MED ORDER — SALINE SPRAY 0.65 % NA SOLN: 2.0000 | Freq: Every day | NASAL | 2 refills | Status: AC | PRN

## 2020-12-10 MED ORDER — LEVOFLOXACIN 750 MG PO TABS: 750.0000 mg | ORAL_TABLET | Freq: Every day | ORAL | 0 refills | Status: DC

## 2020-12-10 MED ORDER — TRELEGY ELLIPTA 100-62.5-25 MCG/INH IN AEPB: INHALATION_SPRAY | RESPIRATORY_TRACT | 3 refills | Status: DC

## 2020-12-10 MED ORDER — PREDNISONE 20 MG PO TABS: 40.0000 mg | ORAL_TABLET | Freq: Every day | ORAL | 0 refills | Status: DC

## 2020-12-10 MED ORDER — ALBUTEROL SULFATE (2.5 MG/3ML) 0.083% IN NEBU: 2.5000 mg | INHALATION_SOLUTION | RESPIRATORY_TRACT | 2 refills | Status: DC | PRN

## 2020-12-10 MED ORDER — ONDANSETRON HCL 4 MG PO TABS: 4.0000 mg | ORAL_TABLET | Freq: Three times a day (TID) | ORAL | 0 refills | Status: DC | PRN

## 2020-12-10 MED ORDER — ALBUTEROL SULFATE HFA 108 (90 BASE) MCG/ACT IN AERS: 2.0000 | INHALATION_SPRAY | RESPIRATORY_TRACT | 11 refills | Status: DC | PRN

## 2020-12-10 NOTE — Patient Instructions (Addendum)
Consider nasal saline 2 sprays followed by nasacort/flonase consider over the counter allergy medication xyzal, zyrtec, claritin or allegra  Sinusitis, Adult Sinusitis is inflammation of your sinuses. Sinuses are hollow spaces in the bones around your face. Your sinuses are located:  Around your eyes.  In the middle of your forehead.  Behind your nose.  In your cheekbones. Mucus normally drains out of your sinuses. When your nasal tissues become inflamed or swollen, mucus can become trapped or blocked. This allows bacteria, viruses, and fungi to grow, which leads to infection. Most infections of the sinuses are caused by a virus. Sinusitis can develop quickly. It can last for up to 4 weeks (acute) or for more than 12 weeks (chronic). Sinusitis often develops after a cold. What are the causes? This condition is caused by anything that creates swelling in the sinuses or stops mucus from draining. This includes:  Allergies.  Asthma.  Infection from bacteria or viruses.  Deformities or blockages in your nose or sinuses.  Abnormal growths in the nose (nasal polyps).  Pollutants, such as chemicals or irritants in the air.  Infection from fungi (rare). What increases the risk? You are more likely to develop this condition if you:  Have a weak body defense system (immune system).  Do a lot of swimming or diving.  Overuse nasal sprays.  Smoke. What are the signs or symptoms? The main symptoms of this condition are pain and a feeling of pressure around the affected sinuses. Other symptoms include:  Stuffy nose or congestion.  Thick drainage from your nose.  Swelling and warmth over the affected sinuses.  Headache.  Upper toothache.  A cough that may get worse at night.  Extra mucus that collects in the throat or the back of the nose (postnasal drip).  Decreased sense of smell and taste.  Fatigue.  A fever.  Sore throat.  Bad breath. How is this diagnosed? This  condition is diagnosed based on:  Your symptoms.  Your medical history.  A physical exam.  Tests to find out if your condition is acute or chronic. This may include: ? Checking your nose for nasal polyps. ? Viewing your sinuses using a device that has a light (endoscope). ? Testing for allergies or bacteria. ? Imaging tests, such as an MRI or CT scan. In rare cases, a bone biopsy may be done to rule out more serious types of fungal sinus disease. How is this treated? Treatment for sinusitis depends on the cause and whether your condition is chronic or acute.  If caused by a virus, your symptoms should go away on their own within 10 days. You may be given medicines to relieve symptoms. They include: ? Medicines that shrink swollen nasal passages (topical intranasal decongestants). ? Medicines that treat allergies (antihistamines). ? A spray that eases inflammation of the nostrils (topical intranasal corticosteroids). ? Rinses that help get rid of thick mucus in your nose (nasal saline washes).  If caused by bacteria, your health care provider may recommend waiting to see if your symptoms improve. Most bacterial infections will get better without antibiotic medicine. You may be given antibiotics if you have: ? A severe infection. ? A weak immune system.  If caused by narrow nasal passages or nasal polyps, you may need to have surgery. Follow these instructions at home: Medicines  Take, use, or apply over-the-counter and prescription medicines only as told by your health care provider. These may include nasal sprays.  If you were prescribed an antibiotic  medicine, take it as told by your health care provider. Do not stop taking the antibiotic even if you start to feel better. Hydrate and humidify  Drink enough fluid to keep your urine pale yellow. Staying hydrated will help to thin your mucus.  Use a cool mist humidifier to keep the humidity level in your home above 50%.  Inhale  steam for 10-15 minutes, 3-4 times a day, or as told by your health care provider. You can do this in the bathroom while a hot shower is running.  Limit your exposure to cool or dry air.   Rest  Rest as much as possible.  Sleep with your head raised (elevated).  Make sure you get enough sleep each night. General instructions  Apply a warm, moist washcloth to your face 3-4 times a day or as told by your health care provider. This will help with discomfort.  Wash your hands often with soap and water to reduce your exposure to germs. If soap and water are not available, use hand sanitizer.  Do not smoke. Avoid being around people who are smoking (secondhand smoke).  Keep all follow-up visits as told by your health care provider. This is important.   Contact a health care provider if:  You have a fever.  Your symptoms get worse.  Your symptoms do not improve within 10 days. Get help right away if:  You have a severe headache.  You have persistent vomiting.  You have severe pain or swelling around your face or eyes.  You have vision problems.  You develop confusion.  Your neck is stiff.  You have trouble breathing. Summary  Sinusitis is soreness and inflammation of your sinuses. Sinuses are hollow spaces in the bones around your face.  This condition is caused by nasal tissues that become inflamed or swollen. The swelling traps or blocks the flow of mucus. This allows bacteria, viruses, and fungi to grow, which leads to infection.  If you were prescribed an antibiotic medicine, take it as told by your health care provider. Do not stop taking the antibiotic even if you start to feel better.  Keep all follow-up visits as told by your health care provider. This is important. This information is not intended to replace advice given to you by your health care provider. Make sure you discuss any questions you have with your health care provider. Document Revised: 01/01/2018  Document Reviewed: 01/01/2018 Elsevier Patient Education  2021 [employer].

## 2020-12-10 NOTE — Progress Notes (Signed)
telephone Note  I connected with [name]  on 12/10/20 at  4:00 PM EDT by telephone and verified that I am speaking with the correct person using two identifiers.  Location patient: home, [place] Location provider:work or home office Persons participating in the virtual visit: patient, provider  I discussed the limitations of evaluation and management by telemedicine and the availability of in person appointments. The patient expressed understanding and agreed to proceed.   HPI: 1. Chronic sinusitis and need for sinus surgery with Dr. Pryor Ochoa and h/o asthma out of trelegy, albuterol neb and albuterol inhalers has not seen pulmonary in the past. She has had low grade fever 99.1, 99.2, runny nose and stopped up nose at the top tried Mucinex. She also has wheezing, had to use oxygen due to it dropping down to 86% with h/o asthma.  10/2020 she was on omnicef for similar sx's sinus issues and she thought she was developing at UTI  Not had covid vaccines and denies covid exposure at home mostly other than appts been to pain clinic and [facility] only but had  On a mask and relatives not sick.   2. Chronic UTI still feels like has uti with frequency, burning will recheck urine [facility] orders placed -COVID-19 vaccine status: no record  ROS: See pertinent positives and negatives per HPI.  Past Medical History:  Diagnosis Date  . Allergic rhinitis   . Anxiety   . Arthralgia   . Asthma   . Bronchitis, chronic ([place])   . Chronic back pain   . Collagenous colitis    followed by Dr Tiffany Kocher  . Depression   . Diplopia 11/15/2013  . Dysphagia   . Endometriosis   . Fibrocystic breast disease   . GERD (gastroesophageal reflux disease)   . History of colon polyps   . Hyperlipidemia   . Hypertension   . Hypokalemia   . IBS (irritable bowel syndrome)   . Memory difficulty 11/15/2013  . Migraine headache   . Pneumonia 2017   x2  . PONV (postoperative nausea and vomiting)    used patch  . Sleep apnea     recommended CPAP  . Ulcer disease   . Urine incontinence     Past Surgical History:  Procedure Laterality Date  . ABDOMINAL HYSTERECTOMY     endometriosis  . BACK SURGERY     x6  . BREAST BIOPSY Right   . COLONOSCOPY WITH PROPOFOL N/A 06/25/2015   Procedure: COLONOSCOPY WITH PROPOFOL;  Surgeon: Manya Silvas, MD;  Location: [facility] ENDOSCOPY;  Service: Endoscopy;  Laterality: N/A;  . DILATION AND CURETTAGE OF UTERUS     and hysteroscopy  . ESOPHAGOGASTRODUODENOSCOPY N/A 06/25/2015   Procedure: ESOPHAGOGASTRODUODENOSCOPY (EGD);  Surgeon: Manya Silvas, MD;  Location: [facility] ENDOSCOPY;  Service: Endoscopy;  Laterality: N/A;  . insertion of permanent spinal cord stimulator    . insertion of trial spinal cord stimulator    . LUMBAR DISC SURGERY    . LUMBAR FUSION  4/05 and 8/07  . LUMBAR MICRODISCECTOMY  03/28/03   L4-5 L5  . MOUTH SURGERY    . NASAL SEPTOPLASTY W/ TURBINOPLASTY    . removal of spinal cord stimulator    . SEPTOPLASTY     with reduction of turbinates  . THYROIDECTOMY N/A 06/06/2016   Procedure: THYROIDECTOMY;  Surgeon: Robert Bellow, MD;  Location: [facility] ORS;  Service: General;  Laterality: N/A;     Current Outpatient Medications:  .  amLODipine (NORVASC) 5 MG  tablet, TAKE 1 TABLET BY MOUTH EVERY DAY, Disp: 90 tablet, Rfl: 1 .  ascorbic acid (VITAMIN C) 500 MG tablet, Take 1 tablet (500 mg total) by mouth daily., Disp: 30 tablet, Rfl: 0 .  bisacodyl (DULCOLAX) 5 MG EC tablet, Take 5 mg by mouth daily as needed for moderate constipation., Disp: , Rfl:  .  diazepam (VALIUM) 5 MG tablet, Take 5 mg by mouth 3 (three) times daily as needed for anxiety., Disp: , Rfl:  .  docusate sodium (COLACE) 100 MG capsule, Take 100 mg by mouth 2 (two) times daily., Disp: , Rfl:  .  esomeprazole (NEXIUM) 20 MG capsule, Take 20 mg by mouth daily at 12 noon., Disp: , Rfl:  .  GRALISE 600 MG TABS, Take 600 mg by mouth 3 (three) times daily., Disp: , Rfl:  .   hydrochlorothiazide (HYDRODIURIL) 25 MG tablet, Take 25 mg by mouth daily., Disp: , Rfl: 11 .  levofloxacin (LEVAQUIN) 750 MG tablet, Take 1 tablet (750 mg total) by mouth daily., Disp: 7 tablet, Rfl: 0 .  metoprolol tartrate (LOPRESSOR) 50 MG tablet, TAKE 1/2 TABLET BY MOUTH TWICE A DAY (NEED OFFICE VISIT), Disp: 90 tablet, Rfl: 0 .  Multiple Vitamin (MULTIVITAMIN) capsule, Take 1 capsule by mouth daily., Disp: , Rfl:  .  Omega-3 Fatty Acids (FISH OIL) 1200 MG CAPS, Take 1 capsule by mouth daily., Disp: , Rfl:  .  ondansetron (ZOFRAN) 4 MG tablet, Take 1 tablet (4 mg total) by mouth every 8 (eight) hours as needed., Disp: 40 tablet, Rfl: 0 .  Oxycodone HCl 10 MG TABS, Take 10 mg by mouth 3 (three) times daily as needed (breakthrough pain)., Disp: , Rfl:  .  pravastatin (PRAVACHOL) 10 MG tablet, TAKE 1 TABLET BY MOUTH EVERY DAY, Disp: 90 tablet, Rfl: 1 .  predniSONE (DELTASONE) 20 MG tablet, Take 2 tablets (40 mg total) by mouth daily with breakfast. X 5-10 days, Disp: 20 tablet, Rfl: 0 .  TIROSINT 137 MCG CAPS, TAKE 1 CAPSULE BY MOUTH DAILY BEFORE BREAKFAST., Disp: 30 capsule, Rfl: 2 .  venlafaxine XR (EFFEXOR-XR) 75 MG 24 hr capsule, Take 3 capsules in the morning, Disp: 270 capsule, Rfl: 0 .  XTAMPZA ER 36 MG C12A, Take 36 mg by mouth every 12 (twelve) hours., Disp: , Rfl: 0 .  zinc sulfate 220 (50 Zn) MG capsule, Take 1 capsule (220 mg total) by mouth daily., Disp: 30 capsule, Rfl: 0 .  zolpidem (AMBIEN) 10 MG tablet, Take 10 mg by mouth at bedtime as needed for sleep., Disp: , Rfl:  .  albuterol (PROVENTIL) (2.5 MG/3ML) 0.083% nebulizer solution, Take 3 mLs (2.5 mg total) by nebulization every 4 (four) hours as needed for wheezing or shortness of breath., Disp: 360 mL, Rfl: 2 .  albuterol (VENTOLIN HFA) 108 (90 Base) MCG/ACT inhaler, Inhale 2 puffs into the lungs every 4 (four) hours as needed for wheezing or shortness of breath., Disp: 1 each, Rfl: 11 .  Fluticasone-Umeclidin-Vilant (TRELEGY  ELLIPTA) 100-62.5-25 MCG/INH AEPB, TAKE 1 PUFF BY MOUTH EVERY DAY, Disp: 60 each, Rfl: 3  EXAM:  VITALS per patient if applicable:  GENERAL: alert, oriented, appears well and in no acute distress  HEENT: sounds nasally congested   LUNGS: from auditory exam no signs of respiratory distress, breathing rate appears normal, no obvious gross SOB, gasping or wheezing   PSYCH/NEURO: pleasant and cooperative, no obvious depression or anxiety, speech and thought processing grossly intact  ASSESSMENT AND PLAN:  Discussed the  following assessment and plan:  Acute recurrent frontal sinusitis - Plan: predniSONE (DELTASONE) 40 MG tablet qd x 5-10 days, levofloxacin (LEVAQUIN) 750 MG tablet x 7 days with zofran  Consider nasal saline 2 sprays followed by nasacort/flonase consider over the counter allergy medication xyzal, zyrtec, claritin or allegra Est. ent Dr. Pryor Ochoa and rec/needs sinus surgery   Acute cystitis without hematuria - Plan: repeat urine labs she does not feel resolved since last UTI 08/2020, 09/2020 Urinalysis, Routine w reflex microscopic, Urine Culture, levofloxacin (LEVAQUIN) 750 MG tablet qd x 7 days  Mild asthma with exacerbation - Plan: predniSONE (DELTASONE) 20 MG tablet, albuterol (VENTOLIN HFA) 108 (90 Base) MCG/ACT inhaler, Fluticasone-Umeclidin-Vilant (TRELEGY ELLIPTA) 100-62.5-25 MCG/INH AEPB, albuterol (PROVENTIL) (2.5 MG/3ML) 0.083% nebulizer solution F/u with pulm in [place] pt needs to call for appt temp supply of above meds alb neb, alb inhaler, trelegy she is out  Monitor O2 if worsening go to ED  Post covid 08/2020 she still needs to do repeat cxr f/u covid pneumonia she will call back to schedule in the next 2-3 weeks   -we discussed possible serious and likely etiologies, options for evaluation and workup, limitations of telemedicine visit vs in person visit, treatment, treatment risks and precautions.   Advised to seek prompt in person care if worsening, new symptoms  arise, or if is not improving with treatment. Discussed options for inperson care if PCP office not available. Did let this patient know that I only do telemedicine on Tuesdays and Thursdays for [place]. Advised to schedule follow up visit with PCP or [facility] if any further questions or concerns to avoid delays in care.   I discussed the assessment and treatment plan with the patient. The patient was provided an opportunity to ask questions and all were answered. The patient agreed with the plan and demonstrated an understanding of the instructions.    Time spent 20 min Delorise Jackson, MD

## 2020-12-10 NOTE — Telephone Encounter (Signed)
Spoken to patient. She stated she has had a sinus infection x 4 weeks. Patient has been having Green nasal discharge. She cough some times per patient. She feels SOB during activity.No fever, chills, nausea,vomiting. She would like and abx, prednisone and an inhaler. Appointment has been scheduled today with Dr Olivia Mackie.

## 2020-12-10 NOTE — Telephone Encounter (Signed)
PT called in and wanted to speak to Larena Glassman about her symptoms and wanted to know if she can be prescribe some antibiotics and wanted to know if she can be prescribe some antibiotics and prednisone. She also states she is currently out of her inhaler Trelegy Ellipta and wanted to know if she could get a prescription filled for this. She states also if she misses the call can they contact Doug at [phone].

## 2020-12-10 NOTE — Telephone Encounter (Signed)
Patient was returning to be triage

## 2020-12-10 NOTE — Progress Notes (Signed)
Onset of symptoms with sinuses 4 weeks ago. Also having a cough and wheezing, SOB staring Sunday night.  Pain pressure in the sinuses, drainage but congestion in the nasal passageways. Drainage is green. Has a history of chronic sinus infections. Patient was sent to ENT and they are discussing sinus surgery.  Dr Pryor Ochoa.   Patient had Covid pneumonia in january.

## 2020-12-10 NOTE — Telephone Encounter (Signed)
Left message for patient to return call back to be triaged

## 2020-12-10 NOTE — Telephone Encounter (Signed)
She needs to be triaged and will need appt if she needs abx and prednisone.

## 2020-12-11 ENCOUNTER — Telehealth: Payer: Self-pay | Admitting: Internal Medicine

## 2020-12-11 NOTE — Telephone Encounter (Signed)
Faxed and signed  [facility] [address] lab report   [phone] on 12-11-20

## 2020-12-14 ENCOUNTER — Telehealth: Payer: Self-pay

## 2020-12-14 NOTE — Telephone Encounter (Signed)
lvm to return in about 3 months (around 03/11/2021) for PCP in person

## 2020-12-15 DIAGNOSIS — G4700 Insomnia, unspecified: Secondary | ICD-10-CM | POA: Diagnosis not present

## 2020-12-15 DIAGNOSIS — M47817 Spondylosis without myelopathy or radiculopathy, lumbosacral region: Secondary | ICD-10-CM | POA: Diagnosis not present

## 2020-12-15 DIAGNOSIS — M62830 Muscle spasm of back: Secondary | ICD-10-CM | POA: Diagnosis not present

## 2020-12-15 DIAGNOSIS — G894 Chronic pain syndrome: Secondary | ICD-10-CM | POA: Diagnosis not present

## 2021-01-06 ENCOUNTER — Telehealth: Payer: Self-pay | Admitting: Internal Medicine

## 2021-01-06 NOTE — Telephone Encounter (Signed)
Patient called and is requesting Dr Nicki Reaper to put an order in to stop her oxygen, she no longer uses it. [facility] phone number is [phone].

## 2021-01-07 NOTE — Progress Notes (Signed)
Virtual Visit via Telephone Note  I connected with [name] on 01/14/21 at  2:00 PM EDT by telephone and verified that I am speaking with the correct person using two identifiers.  Location: Patient: home Provider: office Persons participated in the visit- patient, provider   I discussed the limitations, risks, security and privacy concerns of performing an evaluation and management service by telephone and the availability of in person appointments. I also discussed with the patient that there may be a patient responsible charge related to this service. The patient expressed understanding and agreed to proceed.   I discussed the assessment and treatment plan with the patient. The patient was provided an opportunity to ask questions and all were answered. The patient agreed with the plan and demonstrated an understanding of the instructions.   The patient was advised to call back or seek an in-person evaluation if the symptoms worsen or if the condition fails to improve as anticipated.  I provided 24 minutes of non-face-to-face time during this encounter.   [name] Clay, MD    [facility] MD/PA/NP OP Progress Note  01/14/2021 2:55 PM LEITHA HYPPOLITE  MRN:  [id number]  Chief Complaint:  Chief Complaint    Follow-up; Depression; Anxiety     HPI:  [name] is a 60 y.o. year old female with a history of depression, anxiety,  HTN, HL, dysphasia, asthma. migraine, sleep apnea, s/p Covid pneumonia in 08/2020, who is transferred from Dr. Toy Care.   She states that her depression is bad.  She tends to stay in the room most of the time.  She lost her mother on June 12th last year.  She suffers from COPD, CAD and was admitted for MRSA.  She feels it it unreal that she passed away.  Her sister used to take care of her mother.  Although her father is crying every day, he has been doing better since he has a dog.  She had 6 back surgery, and has had constant pain.  Although she enjoys seeing her  family, she sometimes does not want to do things due to her pain.  She tends to feel anxious.  When she hears ambulance, she tends to think that something might have happened to her family.  She has insomnia.  She does not think she can wear a mask due to her pain.  She feels depressed.  She has fair appetite.  Although she reports passive SI due to her pain, she denies any plan or intent.  She denies alcohol use or drug use.  She takes venlafaxine consistently.   Medication- venlafaxine 225 mg daily  Daily routine: Exercise: Employment: unemployed, disability due to back pain s/p six back surgery Support: Household:  Husband, grandchild (age 62. Moved in since age 60 to help patient and her husband for transportation) Marital status: married Number of children: 1 daughter ("really good' relationship) She has "wonderful" parents. Her mother deceased 25-Feb-2020. 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 MDD (major depressive disorder), recurrent episode, mild (HCC)  F33.0 venlafaxine XR (EFFEXOR-XR) 75 MG 24 hr capsule  3. Anxiety state  F41.1 venlafaxine XR (EFFEXOR-XR) 75 MG 24 hr capsule    Past Psychiatric History:  Outpatient: seen by Dr. Toy Care Psychiatry admission: spent overnight in depression in the context of worsening in pain Previous suicide attempt: denies Past trials of medication: Abilify, quetiapine, (she cannot recall other medication trials)  History of violence:   Past Medical History:  Past Medical History:  Diagnosis Date  . Allergic rhinitis   . Anxiety   . Arthralgia   . Asthma   . Bronchitis, chronic ([place])   . Chronic back pain   . Collagenous colitis    followed by Dr Tiffany Kocher  . COVID-19    09/03/20 with pneumonia  . Depression   . Diplopia 11/15/2013  . Dysphagia   . Endometriosis   . Fibrocystic breast disease   . GERD (gastroesophageal reflux disease)   . History of colon polyps   . Hyperlipidemia   .  Hypertension   . Hypokalemia   . IBS (irritable bowel syndrome)   . Memory difficulty 11/15/2013  . Migraine headache   . Pneumonia 2017   x2  . PONV (postoperative nausea and vomiting)    used patch  . Sleep apnea    recommended CPAP  . Ulcer disease   . Urine incontinence     Past Surgical History:  Procedure Laterality Date  . ABDOMINAL HYSTERECTOMY     endometriosis  . BACK SURGERY     x6  . BREAST BIOPSY Right   . COLONOSCOPY WITH PROPOFOL N/A 06/25/2015   Procedure: COLONOSCOPY WITH PROPOFOL;  Surgeon: Manya Silvas, MD;  Location: [facility] ENDOSCOPY;  Service: Endoscopy;  Laterality: N/A;  . DILATION AND CURETTAGE OF UTERUS     and hysteroscopy  . ESOPHAGOGASTRODUODENOSCOPY N/A 06/25/2015   Procedure: ESOPHAGOGASTRODUODENOSCOPY (EGD);  Surgeon: Manya Silvas, MD;  Location: [facility] ENDOSCOPY;  Service: Endoscopy;  Laterality: N/A;  . insertion of permanent spinal cord stimulator    . insertion of trial spinal cord stimulator    . LUMBAR DISC SURGERY    . LUMBAR FUSION  4/05 and 8/07  . LUMBAR MICRODISCECTOMY  03/28/03   L4-5 L5  . MOUTH SURGERY    . NASAL SEPTOPLASTY W/ TURBINOPLASTY    . removal of spinal cord stimulator    . SEPTOPLASTY     with reduction of turbinates  . THYROIDECTOMY N/A 06/06/2016   Procedure: THYROIDECTOMY;  Surgeon: Robert Bellow, MD;  Location: [facility] ORS;  Service: General;  Laterality: N/A;    Family Psychiatric History: as below  Family History:  Family History  Problem Relation Age of Onset  . Allergies Mother   . Hypertension Mother   . Arthritis Mother   . Hyperlipidemia Mother   . Stroke Mother   . Hypertension Father   . Arthritis Father   . Diabetes Father   . Hypertension Brother   . Diabetes Brother   . Arthritis Maternal Grandmother   . Hyperlipidemia Maternal Grandmother   . Hyperlipidemia Maternal Grandfather   . Arthritis Paternal Grandmother   . Arthritis Paternal Grandfather   . Asthma Other        several  family member  . Leukemia Other        uncle  . Lymphoma Maternal Aunt        non hodgkins    Social History:  Social History   Socioeconomic History  . Marital status: Married    Spouse name: douglas  . Number of children: 1  . Years of education: 35 th  . Highest education level: High school graduate  Occupational History  . Occupation: Disabled    Employer: DISABLED  Tobacco Use  . Smoking status: Never Smoker  . Smokeless tobacco: Never Used  Vaping Use  . Vaping Use: Never used  Substance and Sexual Activity  .  Alcohol use: No    Alcohol/week: 0.0 standard drinks  . Drug use: No  . Sexual activity: Not Currently  Other Topics Concern  . Not on file  Social History Narrative   ** Merged History Encounter **       Social Determinants of Health   Financial Resource Strain: Low Risk   . Difficulty of Paying Living Expenses: Not hard at all  Food Insecurity: No Food Insecurity  . Worried About Charity fundraiser in the Last Year: Never true  . Ran Out of Food in the Last Year: Never true  Transportation Needs: No Transportation Needs  . Lack of Transportation (Medical): No  . Lack of Transportation (Non-Medical): No  Physical Activity: Not on file  Stress: No Stress Concern Present  . Feeling of Stress : Not at all  Social Connections: Unknown  . Frequency of Communication with Friends and Family: Not on file  . Frequency of Social Gatherings with Friends and Family: Not on file  . Attends Religious Services: Not on file  . Active Member of Clubs or Organizations: Not on file  . Attends Archivist Meetings: Not on file  . Marital Status: Married    Allergies:  Allergies  Allergen Reactions  . Fentanyl Other (See Comments)    Other reaction(s): Headache, Other (See Comments) headache SEVERE HEADACHE headache  . Sumatriptan Other (See Comments)    Other reaction(s): Headache Severe headache  . Doxycycline Other (See Comments)    Other  reaction(s): Headache, Other (See Comments) REACTION: causes severe abd pain REACTION: causes severe abd pain REACTION: causes severe abd pain  . Montelukast Other (See Comments)    SEVERE HEADACHE  . Oxymorphone Other (See Comments)    REACTION: severe headaches  . Benzoin Rash  . Buspirone Other (See Comments)    SEVERE HEADACHE  . Ciprofloxacin Nausea Only  . Conjugated Estrogens Other (See Comments)    headaches  . Cymbalta [Duloxetine Hcl] Other (See Comments)    Headache  . Duloxetine Other (See Comments)    Headache  . Erythromycin Ethylsuccinate Other (See Comments)    Stomach ache  . Estradiol Other (See Comments)    headache  . Metronidazole Other (See Comments)    abd pain  . Mirtazapine Other (See Comments)    headache    Metabolic Disorder Labs: Lab Results  Component Value Date   HGBA1C 5.4 05/28/2020   No results found for: PROLACTIN Lab Results  Component Value Date   CHOL 267 (H) 05/28/2020   TRIG 284.0 (H) 05/28/2020   HDL 36.50 (L) 05/28/2020   CHOLHDL 7 05/28/2020   VLDL 56.8 (H) 05/28/2020   LDLCALC 121 (H) 07/12/2016   LDLCALC 187 (H) 08/03/2015   Lab Results  Component Value Date   TSH 0.480 09/06/2020   TSH 0.590 09/04/2020    Therapeutic Level Labs: No results found for: LITHIUM No results found for: VALPROATE No components found for:  CBMZ  Current Medications: Current Outpatient Medications  Medication Sig Dispense Refill  . nortriptyline (PAMELOR) 25 MG capsule Take 1 capsule (25 mg total) by mouth at bedtime. 30 capsule 1  . albuterol (PROVENTIL) (2.5 MG/3ML) 0.083% nebulizer solution Take 3 mLs (2.5 mg total) by nebulization every 4 (four) hours as needed for wheezing or shortness of breath. 360 mL 2  . albuterol (VENTOLIN HFA) 108 (90 Base) MCG/ACT inhaler Inhale 2 puffs into the lungs every 4 (four) hours as needed for wheezing or shortness of breath.  1 each 11  . amLODipine (NORVASC) 5 MG tablet TAKE 1 TABLET BY MOUTH EVERY  DAY 90 tablet 1  . ascorbic acid (VITAMIN C) 500 MG tablet Take 1 tablet (500 mg total) by mouth daily. 30 tablet 0  . bisacodyl (DULCOLAX) 5 MG EC tablet Take 5 mg by mouth daily as needed for moderate constipation.    . diazepam (VALIUM) 5 MG tablet Take 5 mg by mouth 3 (three) times daily as needed for anxiety.    . docusate sodium (COLACE) 100 MG capsule Take 100 mg by mouth 2 (two) times daily.    Marland Kitchen esomeprazole (NEXIUM) 20 MG capsule Take 20 mg by mouth daily at 12 noon.    . Fluticasone-Umeclidin-Vilant (TRELEGY ELLIPTA) 100-62.5-25 MCG/INH AEPB TAKE 1 PUFF BY MOUTH EVERY DAY 60 each 3  . GRALISE 600 MG TABS Take 600 mg by mouth 3 (three) times daily.    . hydrochlorothiazide (HYDRODIURIL) 25 MG tablet Take 25 mg by mouth daily.  11  . levofloxacin (LEVAQUIN) 750 MG tablet Take 1 tablet (750 mg total) by mouth daily. 7 tablet 0  . metoprolol tartrate (LOPRESSOR) 50 MG tablet TAKE 1/2 TABLET BY MOUTH TWICE A DAY (NEED OFFICE VISIT) 90 tablet 0  . Multiple Vitamin (MULTIVITAMIN) capsule Take 1 capsule by mouth daily.    . Omega-3 Fatty Acids (FISH OIL) 1200 MG CAPS Take 1 capsule by mouth daily.    . ondansetron (ZOFRAN) 4 MG tablet Take 1 tablet (4 mg total) by mouth every 8 (eight) hours as needed. 40 tablet 0  . Oxycodone HCl 10 MG TABS Take 10 mg by mouth 3 (three) times daily as needed (breakthrough pain).    . pravastatin (PRAVACHOL) 10 MG tablet TAKE 1 TABLET BY MOUTH EVERY DAY 90 tablet 1  . predniSONE (DELTASONE) 20 MG tablet Take 2 tablets (40 mg total) by mouth daily with breakfast. X 5-10 days 20 tablet 0  . sodium chloride (OCEAN) 0.65 % SOLN nasal spray Place 2 sprays into both nostrils daily as needed for congestion. 30 mL 2  . TIROSINT 137 MCG CAPS TAKE 1 CAPSULE BY MOUTH DAILY BEFORE BREAKFAST. 30 capsule 2  . [START ON 01/29/2021] venlafaxine XR (EFFEXOR-XR) 75 MG 24 hr capsule Take 3 capsules in the morning 270 capsule 0  . XTAMPZA ER 36 MG C12A Take 36 mg by mouth every 12  (twelve) hours.  0  . zinc sulfate 220 (50 Zn) MG capsule Take 1 capsule (220 mg total) by mouth daily. 30 capsule 0  . zolpidem (AMBIEN) 10 MG tablet Take 10 mg by mouth at bedtime as needed for sleep.     No current facility-administered medications for this visit.     Musculoskeletal: Strength & Muscle Tone: N/A Gait & Station: N/A Patient leans: N/A  Psychiatric Specialty Exam: Review of Systems  Psychiatric/Behavioral: Positive for dysphoric mood and sleep disturbance. Negative for agitation, behavioral problems, confusion, decreased concentration, hallucinations, self-injury and suicidal ideas. The patient is nervous/anxious. The patient is not hyperactive.   All other systems reviewed and are negative.   There were no vitals taken for this visit.There is no height or weight on file to calculate BMI.  General Appearance: NA  Eye Contact:  NA  Speech:  Clear and Coherent  Volume:  Normal  Mood:  Depressed  Affect:  NA  Thought Process:  Coherent  Orientation:  Full (Time, Place, and Person)  Thought Content: Logical   Suicidal Thoughts:  No  Homicidal  Thoughts:  No  Memory:  Immediate;   Good  Judgement:  Good  Insight:  Fair  Psychomotor Activity:  Normal  Concentration:  Concentration: Good and Attention Span: Good  Recall:  Good  Fund of Knowledge: Good  Language: Good  Akathisia:  No  Handed:  Right  AIMS (if indicated): N/A  Assets:  Communication Skills Desire for Improvement  ADL's:  Intact  Cognition: WNL  Sleep:  Poor   Screenings: PHQ2-9   Flowsheet Row Video Visit from 11/05/2020 in [place] Office Visit from 06/05/2020 in [place] from 05/05/2020 in [place] Office Visit from 04/30/2019 in [place] from 03/13/2019 in [place]  PHQ-2 Total Score 0 5 1 4  0  PHQ-9 Total Score 0 20 -- 14 --    Flowsheet Row  Video Visit from 01/14/2021 in [place] ED to Hosp-Admission (Discharged) from 09/03/2020 in [place] (1C) Office Visit from 06/05/2020 in [place] Low Risk No Risk Error: Question 2 not populated       Assessment and Plan:  [name] is a 60 y.o. year old female with a history of depression, anxiety,  HTN, HL, dysphasia, asthma. migraine, sleep apnea, s/p covid pneumonia in 08/2020, who is transferred from Dr. Toy Care.   1. MDD (major depressive disorder), recurrent episode, mild ([place]) 2. Anxiety state She continues to report depressive symptoms and anxiety in the context of complicated grief of loss of her mother in June 2021.  Other psychosocial stressors includes chronic pain.  Will add nortriptyline to target depression, anxiety and insomnia.  We will monitor for serotonin syndromes.  Discussed potential risk of increasing in appetite.  Will continue venlafaxine to target depression and anxiety.   Plan 1. Continue venlafaxine 225 mg daily 2. Start nortriptyline 25 mg at night  3. Next appointment: 7/14 at 2:30 for 30 mins, phone - on Ambien 10 mg at night, valium 5 mg TID  The patient demonstrates the following risk factors for suicide: Chronic risk factors for suicide include: psychiatric disorder of depression. Acute risk factors for suicide include: unemployment. Protective factors for this patient include: positive social support, coping skills and hope for the future. Considering these factors, the overall suicide risk at this point appears to be low. Patient is appropriate for outpatient follow up.   [name] Clay, MD 01/14/2021, 2:55 PM

## 2021-01-08 NOTE — Telephone Encounter (Signed)
Has not seen pulmonary in over a year and has not used her oxygen for over 2 months. Confirmed no issues with her breathing or oxygen levels. Requesting order to d/c oxygen. Do you want to set her up for an appt prior to giving order since we have not seen her in a few months?

## 2021-01-09 NOTE — Telephone Encounter (Signed)
[name] - To discontinue oxygen, do we need a documented O2 sats?  I can schedule her for an appt if needed.  Just wanted to touch base and make sure.  Thanks for your help.

## 2021-01-12 NOTE — Telephone Encounter (Signed)
Ok. Thank you.

## 2021-01-12 NOTE — Telephone Encounter (Signed)
Yes needs documented 02 sats. Sent to [place] to schedule,

## 2021-01-13 NOTE — Telephone Encounter (Signed)
Offered patient an appointment in June. She wanted PM. I have her scheduled in July. She is also going to call and make an appointment with Pulmonary as well since she has not seen them.

## 2021-01-14 ENCOUNTER — Telehealth (INDEPENDENT_AMBULATORY_CARE_PROVIDER_SITE_OTHER): Payer: Medicare Other | Admitting: Psychiatry

## 2021-01-14 ENCOUNTER — Other Ambulatory Visit: Payer: Self-pay

## 2021-01-14 ENCOUNTER — Encounter: Payer: Self-pay | Admitting: Psychiatry

## 2021-01-14 DIAGNOSIS — F4329 Adjustment disorder with other symptoms: Secondary | ICD-10-CM

## 2021-01-14 DIAGNOSIS — F4381 Prolonged grief disorder: Secondary | ICD-10-CM

## 2021-01-14 DIAGNOSIS — F411 Generalized anxiety disorder: Secondary | ICD-10-CM

## 2021-01-14 DIAGNOSIS — F330 Major depressive disorder, recurrent, mild: Secondary | ICD-10-CM | POA: Diagnosis not present

## 2021-01-14 MED ORDER — NORTRIPTYLINE HCL 25 MG PO CAPS: 25.0000 mg | ORAL_CAPSULE | Freq: Every day | ORAL | 1 refills | Status: DC

## 2021-01-14 MED ORDER — VENLAFAXINE HCL ER 75 MG PO CP24: ORAL_CAPSULE | ORAL | 0 refills | Status: DC

## 2021-01-14 NOTE — Patient Instructions (Signed)
1. Continue venlafaxine 225 mg daily 2. Start nortriptyline 25 mg at night  3. Next appointment: 7/14 at 2:30

## 2021-01-17 ENCOUNTER — Other Ambulatory Visit: Payer: Self-pay | Admitting: Internal Medicine

## 2021-01-18 ENCOUNTER — Other Ambulatory Visit: Payer: Self-pay | Admitting: Internal Medicine

## 2021-01-18 DIAGNOSIS — G894 Chronic pain syndrome: Secondary | ICD-10-CM | POA: Diagnosis not present

## 2021-01-18 DIAGNOSIS — M47817 Spondylosis without myelopathy or radiculopathy, lumbosacral region: Secondary | ICD-10-CM | POA: Diagnosis not present

## 2021-01-18 DIAGNOSIS — G4700 Insomnia, unspecified: Secondary | ICD-10-CM | POA: Diagnosis not present

## 2021-01-18 DIAGNOSIS — M62830 Muscle spasm of back: Secondary | ICD-10-CM | POA: Diagnosis not present

## 2021-01-18 NOTE — Telephone Encounter (Signed)
Please clarify with pt if she is ok with change.

## 2021-01-19 NOTE — Telephone Encounter (Signed)
LMTCB

## 2021-01-25 ENCOUNTER — Telehealth: Payer: Self-pay | Admitting: Internal Medicine

## 2021-01-25 NOTE — Telephone Encounter (Signed)
Pt cancelled appt and will call back to reschedule." Agustina Caroli said on Jan 25, 2021 11:45 AM  Pt appt was on 01/25/2021 11:00 am  Msg from [facility] gastro

## 2021-02-16 DIAGNOSIS — Z20822 Contact with and (suspected) exposure to covid-19: Secondary | ICD-10-CM | POA: Diagnosis not present

## 2021-02-17 DIAGNOSIS — G4700 Insomnia, unspecified: Secondary | ICD-10-CM | POA: Diagnosis not present

## 2021-02-17 DIAGNOSIS — G894 Chronic pain syndrome: Secondary | ICD-10-CM | POA: Diagnosis not present

## 2021-02-17 DIAGNOSIS — M62830 Muscle spasm of back: Secondary | ICD-10-CM | POA: Diagnosis not present

## 2021-02-17 DIAGNOSIS — Z79891 Long term (current) use of opiate analgesic: Secondary | ICD-10-CM | POA: Diagnosis not present

## 2021-02-17 DIAGNOSIS — M47817 Spondylosis without myelopathy or radiculopathy, lumbosacral region: Secondary | ICD-10-CM | POA: Diagnosis not present

## 2021-02-22 NOTE — Progress Notes (Deleted)
BH MD/PA/NP OP Progress Note  02/22/2021 11:11 AM [name]  MRN:  [id number]  Chief Complaint:  HPI: *** Visit Diagnosis: No diagnosis found.  Past Psychiatric History: Please see initial evaluation for full details. I have reviewed the history. No updates at this time.     Past Medical History:  Past Medical History:  Diagnosis Date   Allergic rhinitis    Anxiety    Arthralgia    Asthma    Bronchitis, chronic (HCC)    Chronic back pain    Collagenous colitis    followed by Dr Tiffany Kocher   COVID-19    09/03/20 with pneumonia   Depression    Diplopia 11/15/2013   Dysphagia    Endometriosis    Fibrocystic breast disease    GERD (gastroesophageal reflux disease)    History of colon polyps    Hyperlipidemia    Hypertension    Hypokalemia    IBS (irritable bowel syndrome)    Memory difficulty 11/15/2013   Migraine headache    Pneumonia 2017   x2   PONV (postoperative nausea and vomiting)    used patch   Sleep apnea    recommended CPAP   Ulcer disease    Urine incontinence     Past Surgical History:  Procedure Laterality Date   ABDOMINAL HYSTERECTOMY     endometriosis   BACK SURGERY     x6   BREAST BIOPSY Right    COLONOSCOPY WITH PROPOFOL N/A 06/25/2015   Procedure: COLONOSCOPY WITH PROPOFOL;  Surgeon: Manya Silvas, MD;  Location: [facility];  Service: Endoscopy;  Laterality: N/A;   DILATION AND CURETTAGE OF UTERUS     and hysteroscopy   ESOPHAGOGASTRODUODENOSCOPY N/A 06/25/2015   Procedure: ESOPHAGOGASTRODUODENOSCOPY (EGD);  Surgeon: Manya Silvas, MD;  [facility] ENDOSCOPY;  Service: Endoscopy;  Laterality: N/A;   insertion of permanent spinal cord stimulator     insertion of trial spinal cord stimulator     LUMBAR DISC SURGERY     LUMBAR FUSION  4/05 and 8/07   LUMBAR MICRODISCECTOMY  03/28/03   L4-5 L5   MOUTH SURGERY     NASAL SEPTOPLASTY W/ TURBINOPLASTY     removal of spinal cord stimulator     SEPTOPLASTY     with reduction of  turbinates   THYROIDECTOMY N/A 06/06/2016   Procedure: THYROIDECTOMY;  Surgeon: Robert Bellow, MD;  Location: [facility];  Service: General;  Laterality: N/A;    Family Psychiatric History: Please see initial evaluation for full details. I have reviewed the history. No updates at this time.     Family History:  Family History  Problem Relation Age of Onset   Allergies Mother    Hypertension Mother    Arthritis Mother    Hyperlipidemia Mother    Stroke Mother    Hypertension Father    Arthritis Father    Diabetes Father    Hypertension Brother    Diabetes Brother    Arthritis Maternal Grandmother    Hyperlipidemia Maternal Grandmother    Hyperlipidemia Maternal Grandfather    Arthritis Paternal Grandmother    Arthritis Paternal Grandfather    Asthma Other        several family member   Leukemia Other        uncle   Lymphoma Maternal Aunt        non hodgkins    Social History:  Social History   Socioeconomic History   Marital status: Married    Spouse  name: douglas   Number of children: 1   Years of education: 76 th   Highest education level: High school graduate  Occupational History   Occupation: Disabled    Employer: DISABLED  Tobacco Use   Smoking status: Never   Smokeless tobacco: Never  Vaping Use   Vaping Use: Never used  Substance and Sexual Activity   Alcohol use: No    Alcohol/week: 0.0 standard drinks   Drug use: No   Sexual activity: Not Currently  Other Topics Concern   Not on file  Social History Narrative   ** Merged History Encounter **       Social Determinants of Health   Financial Resource Strain: Low Risk    Difficulty of Paying Living Expenses: Not hard at all  Food Insecurity: No Food Insecurity   Worried About Charity fundraiser in the Last Year: Never true   [place] in the Last Year: Never true  Transportation Needs: No Transportation Needs   Lack of Transportation (Medical): No   Lack of Transportation  (Non-Medical): No  Physical Activity: Not on file  Stress: No Stress Concern Present   Feeling of Stress : Not at all  Social Connections: Unknown   Frequency of Communication with Friends and Family: Not on file   Frequency of Social Gatherings with Friends and Family: Not on file   Attends Religious Services: Not on file   Active Member of Clubs or Organizations: Not on file   Attends Archivist Meetings: Not on file   Marital Status: Married    Allergies:  Allergies  Allergen Reactions   Fentanyl Other (See Comments)    Other reaction(s): Headache, Other (See Comments) headache SEVERE HEADACHE headache   Sumatriptan Other (See Comments)    Other reaction(s): Headache Severe headache   Doxycycline Other (See Comments)    Other reaction(s): Headache, Other (See Comments) REACTION: causes severe abd pain REACTION: causes severe abd pain REACTION: causes severe abd pain   Montelukast Other (See Comments)    SEVERE HEADACHE   Oxymorphone Other (See Comments)    REACTION: severe headaches   Benzoin Rash   Buspirone Other (See Comments)    SEVERE HEADACHE   Ciprofloxacin Nausea Only   Conjugated Estrogens Other (See Comments)    headaches   Cymbalta [Duloxetine Hcl] Other (See Comments)    Headache   Duloxetine Other (See Comments)    Headache   Erythromycin Ethylsuccinate Other (See Comments)    Stomach ache   Estradiol Other (See Comments)    headache   Metronidazole Other (See Comments)    abd pain   Mirtazapine Other (See Comments)    headache    Metabolic Disorder Labs: Lab Results  Component Value Date   HGBA1C 5.4 05/28/2020   No results found for: PROLACTIN Lab Results  Component Value Date   CHOL 267 (H) 05/28/2020   TRIG 284.0 (H) 05/28/2020   HDL 36.50 (L) 05/28/2020   CHOLHDL 7 05/28/2020   VLDL 56.8 (H) 05/28/2020   LDLCALC 121 (H) 07/12/2016   LDLCALC 187 (H) 08/03/2015   Lab Results  Component Value Date   TSH 0.480  09/06/2020   TSH 0.590 09/04/2020    Therapeutic Level Labs: No results found for: LITHIUM No results found for: VALPROATE No components found for:  CBMZ  Current Medications: Current Outpatient Medications  Medication Sig Dispense Refill   albuterol (PROVENTIL) (2.5 MG/3ML) 0.083% nebulizer solution Take 3 mLs (2.5 mg total)  by nebulization every 4 (four) hours as needed for wheezing or shortness of breath. 360 mL 2   albuterol (VENTOLIN HFA) 108 (90 Base) MCG/ACT inhaler Inhale 2 puffs into the lungs every 4 (four) hours as needed for wheezing or shortness of breath. 1 each 11   amLODipine (NORVASC) 5 MG tablet TAKE 1 TABLET BY MOUTH EVERY DAY 90 tablet 1   ascorbic acid (VITAMIN C) 500 MG tablet Take 1 tablet (500 mg total) by mouth daily. 30 tablet 0   bisacodyl (DULCOLAX) 5 MG EC tablet Take 5 mg by mouth daily as needed for moderate constipation.     diazepam (VALIUM) 5 MG tablet Take 5 mg by mouth 3 (three) times daily as needed for anxiety.     docusate sodium (COLACE) 100 MG capsule Take 100 mg by mouth 2 (two) times daily.     esomeprazole (NEXIUM) 20 MG capsule Take 20 mg by mouth daily at 12 noon.     Fluticasone-Umeclidin-Vilant (TRELEGY ELLIPTA) 100-62.5-25 MCG/INH AEPB TAKE 1 PUFF BY MOUTH EVERY DAY 60 each 3   GRALISE 600 MG TABS Take 600 mg by mouth 3 (three) times daily.     hydrochlorothiazide (HYDRODIURIL) 25 MG tablet Take 25 mg by mouth daily.  11   levofloxacin (LEVAQUIN) 750 MG tablet Take 1 tablet (750 mg total) by mouth daily. 7 tablet 0   metoprolol tartrate (LOPRESSOR) 50 MG tablet TAKE 1/2 TABLET BY MOUTH TWICE A DAY (NEED OFFICE VISIT) 90 tablet 0   Multiple Vitamin (MULTIVITAMIN) capsule Take 1 capsule by mouth daily.     nortriptyline (PAMELOR) 25 MG capsule Take 1 capsule (25 mg total) by mouth at bedtime. 30 capsule 1   Omega-3 Fatty Acids (FISH OIL) 1200 MG CAPS Take 1 capsule by mouth daily.     ondansetron (ZOFRAN) 4 MG tablet Take 1 tablet (4 mg  total) by mouth every 8 (eight) hours as needed. 40 tablet 0   Oxycodone HCl 10 MG TABS Take 10 mg by mouth 3 (three) times daily as needed (breakthrough pain).     pravastatin (PRAVACHOL) 10 MG tablet TAKE 1 TABLET BY MOUTH EVERY DAY 90 tablet 1   predniSONE (DELTASONE) 20 MG tablet Take 2 tablets (40 mg total) by mouth daily with breakfast. X 5-10 days 20 tablet 0   sodium chloride (OCEAN) 0.65 % SOLN nasal spray Place 2 sprays into both nostrils daily as needed for congestion. 30 mL 2   TIROSINT 137 MCG CAPS TAKE 1 CAPSULE BY MOUTH EVERY DAY BEFORE BREAKFAST 30 capsule 2   venlafaxine XR (EFFEXOR-XR) 75 MG 24 hr capsule Take 3 capsules in the morning 270 capsule 0   XTAMPZA ER 36 MG C12A Take 36 mg by mouth every 12 (twelve) hours.  0   zinc sulfate 220 (50 Zn) MG capsule Take 1 capsule (220 mg total) by mouth daily. 30 capsule 0   zolpidem (AMBIEN) 10 MG tablet Take 10 mg by mouth at bedtime as needed for sleep.     No current facility-administered medications for this visit.     Musculoskeletal: Strength & Muscle Tone:  N/A Gait & Station:  N/A Patient leans: N/A  Psychiatric Specialty Exam: Review of Systems  There were no vitals taken for this visit.There is no height or weight on file to calculate BMI.  General Appearance: {Appearance:22683}  Eye Contact:  {BHH EYE CONTACT:22684}  Speech:  Clear and Coherent  Volume:  Normal  Mood:  {BHH MOOD:22306}  Affect:  {  Affect (PAA):22687}  Thought Process:  Coherent  Orientation:  Full (Time, Place, and Person)  Thought Content: Logical   Suicidal Thoughts:  {ST/HT (PAA):22692}  Homicidal Thoughts:  {ST/HT (PAA):22692}  Memory:  Immediate;   Good  Judgement:  {Judgement (PAA):22694}  Insight:  {Insight (PAA):22695}  Psychomotor Activity:  Normal  Concentration:  Concentration: Good and Attention Span: Good  Recall:  Good  Fund of Knowledge: Good  Language: Good  Akathisia:  No  Handed:  Right  AIMS (if indicated): not done   Assets:  Communication Skills Desire for Improvement  ADL's:  Intact  Cognition: WNL  Sleep:  {B[facility] GOOD/FAIR/POOR:22877}   Screenings: PHQ2-9    Flowsheet Row Video Visit from 11/05/2020 in [place] Office Visit from 06/05/2020 in [place] from 05/05/2020 in [place] Office Visit from 04/30/2019 in [place] from 03/13/2019 in [place]  PHQ-2 Total Score 0 5 1 4  0  PHQ-9 Total Score 0 20 -- 14 --      Flowsheet Row Video Visit from 01/14/2021 in [facility] to Hosp-Admission (Discharged) from 09/03/2020 in [place] (1C) Office Visit from 06/05/2020 in [place] Low Risk No Risk Error: Question 2 not populated        Assessment and Plan:  [name] is a 60 y.o. year old female with a history of epression, anxiety,  HTN, HL, dysphasia, asthma. migraine, sleep apnea, s/p covid pneumonia in 08/2020, who presents for follow up appointment for below.     1. MDD (major depressive disorder), recurrent episode, mild ([place]) 2. Anxiety state She continues to report depressive symptoms and anxiety in the context of complicated grief of loss of her mother in June 2021.  Other psychosocial stressors includes chronic pain.  Will add nortriptyline to target depression, anxiety and insomnia.  We will monitor for serotonin syndromes.  Discussed potential risk of increasing in appetite.  Will continue venlafaxine to target depression and anxiety.    Plan 1. Continue venlafaxine 225 mg daily 2. Start nortriptyline 25 mg at night 3. Next appointment: 7/14 at 2:30 for 30 mins, phone - on Ambien 10 mg at night, valium 5 mg TID   The patient demonstrates the following risk factors for suicide: Chronic risk factors for suicide include: psychiatric  disorder of depression. Acute risk factors for suicide include: unemployment. Protective factors for this patient include: positive social support, coping skills and hope for the future. Considering these factors, the overall suicide risk at this point appears to be low. Patient is appropriate for outpatient follow up.       Norman Clay, MD 02/22/2021, 11:11 AM

## 2021-02-23 ENCOUNTER — Other Ambulatory Visit: Payer: Self-pay

## 2021-02-23 ENCOUNTER — Ambulatory Visit (INDEPENDENT_AMBULATORY_CARE_PROVIDER_SITE_OTHER): Payer: Medicare Other

## 2021-02-23 ENCOUNTER — Encounter: Payer: Self-pay | Admitting: Internal Medicine

## 2021-02-23 ENCOUNTER — Ambulatory Visit (INDEPENDENT_AMBULATORY_CARE_PROVIDER_SITE_OTHER): Payer: Medicare Other | Admitting: Internal Medicine

## 2021-02-23 VITALS — BP 112/62 | HR 74 | Temp 98.3°F | Ht 67.0 in | Wt 200.6 lb

## 2021-02-23 DIAGNOSIS — R918 Other nonspecific abnormal finding of lung field: Secondary | ICD-10-CM

## 2021-02-23 DIAGNOSIS — I10 Essential (primary) hypertension: Secondary | ICD-10-CM

## 2021-02-23 DIAGNOSIS — E7800 Pure hypercholesterolemia, unspecified: Secondary | ICD-10-CM | POA: Diagnosis not present

## 2021-02-23 DIAGNOSIS — R1310 Dysphagia, unspecified: Secondary | ICD-10-CM | POA: Diagnosis not present

## 2021-02-23 DIAGNOSIS — E039 Hypothyroidism, unspecified: Secondary | ICD-10-CM | POA: Diagnosis not present

## 2021-02-23 DIAGNOSIS — R9389 Abnormal findings on diagnostic imaging of other specified body structures: Secondary | ICD-10-CM | POA: Diagnosis not present

## 2021-02-23 DIAGNOSIS — M3390 Dermatopolymyositis, unspecified, organ involvement unspecified: Secondary | ICD-10-CM

## 2021-02-23 DIAGNOSIS — K219 Gastro-esophageal reflux disease without esophagitis: Secondary | ICD-10-CM | POA: Diagnosis not present

## 2021-02-23 DIAGNOSIS — Z86010 Personal history of colonic polyps: Secondary | ICD-10-CM | POA: Diagnosis not present

## 2021-02-23 DIAGNOSIS — R0902 Hypoxemia: Secondary | ICD-10-CM

## 2021-02-23 DIAGNOSIS — F3341 Major depressive disorder, recurrent, in partial remission: Secondary | ICD-10-CM | POA: Diagnosis not present

## 2021-02-23 DIAGNOSIS — Z8616 Personal history of COVID-19: Secondary | ICD-10-CM | POA: Diagnosis not present

## 2021-02-23 DIAGNOSIS — J4520 Mild intermittent asthma, uncomplicated: Secondary | ICD-10-CM | POA: Diagnosis not present

## 2021-02-23 MED ORDER — PREDNISONE 10 MG PO TABS: ORAL_TABLET | ORAL | 0 refills | Status: DC

## 2021-02-23 NOTE — Progress Notes (Signed)
Patient ID: [name], female   DOB: Apr 28, 1961, 60 y.o.   MRN: [id number]   Subjective:    Patient ID: [name], female    DOB: Nov 19, 1960, 60 y.o.   MRN: [id number]  HPI This visit occurred during the SARS-CoV-2 public health emergency.  Safety protocols were in place, including screening questions prior to the visit, additional usage of staff PPE, and extensive cleaning of exam room while observing appropriate contact time as indicated for disinfecting solutions.   Patient here for follow up appt.  Here to follow up regarding her oxygen.  She is accompanied by her husband.  History obtained from both of them. She has not been using her oxygen and wanted to come in for evaluation to get this discontinued.  States her breathing is stable.  Has not used her oxygen in a while.  Does take mucinex.  Uses saline nasal spray.  Using trelegy q hs.  Albuterol prn.  Has had recurring sinus issues. Due to f/u with Dr Pryor Ochoa next week.  Some acid reflux and feeling like something hangs in her throat at times.  Some increased congestion recently.  Chronic issue, but some increased congestion and cough recently.  Discussed prednisone. Was scheduled to see GI.  She plans to call and reschedule.  No chest pain reported.  Eating.  States she is up and moving more - has a new dog.  No abdominal pain.  Bowels moving.   Past Medical History:  Diagnosis Date   Allergic rhinitis    Anxiety    Arthralgia    Asthma    Bronchitis, chronic (HCC)    Chronic back pain    Collagenous colitis    followed by Dr Tiffany Kocher   COVID-19    09/03/20 with pneumonia   Depression    Diplopia 11/15/2013   Dysphagia    Endometriosis    Fibrocystic breast disease    GERD (gastroesophageal reflux disease)    History of colon polyps    Hyperlipidemia    Hypertension    Hypokalemia    IBS (irritable bowel syndrome)    Memory difficulty 11/15/2013   Migraine headache    Pneumonia 2017   x2   PONV (postoperative nausea and  vomiting)    used patch   Sleep apnea    recommended CPAP   Ulcer disease    Urine incontinence    Past Surgical History:  Procedure Laterality Date   ABDOMINAL HYSTERECTOMY     endometriosis   BACK SURGERY     x6   BREAST BIOPSY Right    COLONOSCOPY WITH PROPOFOL N/A 06/25/2015   Procedure: COLONOSCOPY WITH PROPOFOL;  Surgeon: Manya Silvas, MD;  Location: [place];  Service: Endoscopy;  Laterality: N/A;   DILATION AND CURETTAGE OF UTERUS     and hysteroscopy   ESOPHAGOGASTRODUODENOSCOPY N/A 06/25/2015   Procedure: ESOPHAGOGASTRODUODENOSCOPY (EGD);  Surgeon: Manya Silvas, MD;  Location: [facility];  Service: Endoscopy;  Laterality: N/A;   insertion of permanent spinal cord stimulator     insertion of trial spinal cord stimulator     LUMBAR DISC SURGERY     LUMBAR FUSION  4/05 and 8/07   LUMBAR MICRODISCECTOMY  03/28/03   L4-5 L5   MOUTH SURGERY     NASAL SEPTOPLASTY W/ TURBINOPLASTY     removal of spinal cord stimulator     SEPTOPLASTY     with reduction of turbinates   THYROIDECTOMY N/A 06/06/2016   Procedure: THYROIDECTOMY;  Surgeon: Robert Bellow, MD;  Location: [facility];  Service: General;  Laterality: N/A;   Family History  Problem Relation Age of Onset   Allergies Mother    Hypertension Mother    Arthritis Mother    Hyperlipidemia Mother    Stroke Mother    Hypertension Father    Arthritis Father    Diabetes Father    Hypertension Brother    Diabetes Brother    Arthritis Maternal Grandmother    Hyperlipidemia Maternal Grandmother    Hyperlipidemia Maternal Grandfather    Arthritis Paternal Grandmother    Arthritis Paternal Grandfather    Asthma Other        several family member   Leukemia Other        uncle   Lymphoma Maternal Aunt        non hodgkins   Social History   Socioeconomic History   Marital status: Married    Spouse name: douglas   Number of children: 1   Years of education: 12 th   Highest education level: High  school graduate  Occupational History   Occupation: Disabled    Employer: DISABLED  Tobacco Use   Smoking status: Never   Smokeless tobacco: Never  Vaping Use   Vaping Use: Never used  Substance and Sexual Activity   Alcohol use: No    Alcohol/week: 0.0 standard drinks   Drug use: No   Sexual activity: Not Currently  Other Topics Concern   Not on file  Social History Narrative   ** Merged History Encounter **       Social Determinants of Health   Financial Resource Strain: Low Risk    Difficulty of Paying Living Expenses: Not hard at all  Food Insecurity: No Food Insecurity   Worried About Charity fundraiser in the Last Year: Never true   [place] in the Last Year: Never true  Transportation Needs: No Transportation Needs   Lack of Transportation (Medical): No   Lack of Transportation (Non-Medical): No  Physical Activity: Not on file  Stress: No Stress Concern Present   Feeling of Stress : Not at all  Social Connections: Unknown   Frequency of Communication with Friends and Family: Not on file   Frequency of Social Gatherings with Friends and Family: Not on file   Attends Religious Services: Not on file   Active Member of Clubs or Organizations: Not on file   Attends Archivist Meetings: Not on file   Marital Status: Married    Review of Systems  Constitutional:  Negative for appetite change and unexpected weight change.  HENT:  Positive for congestion. Negative for sinus pressure.   Respiratory:  Positive for cough. Negative for chest tightness.        Breathing stable.  No increased sob.  Some wheezing.   Cardiovascular:  Negative for chest pain and palpitations.  Gastrointestinal:  Negative for abdominal pain, diarrhea, nausea and vomiting.       Acid reflux as outlined.    Genitourinary:  Negative for difficulty urinating and dysuria.  Musculoskeletal:  Negative for joint swelling and myalgias.  Skin:  Negative for color change and rash.   Neurological:  Negative for dizziness, light-headedness and headaches.  Psychiatric/Behavioral:  Negative for agitation and dysphoric mood.       Objective:    Physical Exam Vitals reviewed.  Constitutional:      General: She is not in acute distress.    Appearance: Normal appearance.  HENT:     Head: Normocephalic and atraumatic.     Right Ear: External ear normal.     Left Ear: External ear normal.  Eyes:     General: No scleral icterus.       Right eye: No discharge.        Left eye: No discharge.     Conjunctiva/sclera: Conjunctivae normal.  Neck:     Thyroid: No thyromegaly.  Cardiovascular:     Rate and Rhythm: Normal rate and regular rhythm.  Pulmonary:     Effort: No respiratory distress.     Breath sounds: Normal breath sounds.     Comments: Some increased cough with forced expiration.  Few scattered wheezing.   Abdominal:     General: Bowel sounds are normal.     Palpations: Abdomen is soft.     Tenderness: There is no abdominal tenderness.  Musculoskeletal:        General: No swelling or tenderness.     Cervical back: Neck supple. No tenderness.  Lymphadenopathy:     Cervical: No cervical adenopathy.  Skin:    Findings: No erythema or rash.  Neurological:     Mental Status: She is alert.  Psychiatric:        Mood and Affect: Mood normal.        Behavior: Behavior normal.    BP 112/62 (BP Location: Left Arm, Patient Position: Sitting, Cuff Size: Large)   Pulse 74   Temp 98.3 F (36.8 C) (Oral)   Ht 5\' 7"  (1.702 m)   Wt 200 lb 9.6 oz (91 kg)   SpO2 95% Comment: While ambluating  BMI 31.42 kg/m  Wt Readings from Last 3 Encounters:  02/23/21 200 lb 9.6 oz (91 kg)  12/10/20 199 lb (90.3 kg)  11/05/20 205 lb (93 kg)    Outpatient Encounter Medications as of 02/23/2021  Medication Sig   albuterol (PROVENTIL) (2.5 MG/3ML) 0.083% nebulizer solution Take 3 mLs (2.5 mg total) by nebulization every 4 (four) hours as needed for wheezing or shortness of  breath.   albuterol (VENTOLIN HFA) 108 (90 Base) MCG/ACT inhaler Inhale 2 puffs into the lungs every 4 (four) hours as needed for wheezing or shortness of breath.   amLODipine (NORVASC) 5 MG tablet TAKE 1 TABLET BY MOUTH EVERY DAY   ascorbic acid (VITAMIN C) 500 MG tablet Take 1 tablet (500 mg total) by mouth daily.   bisacodyl (DULCOLAX) 5 MG EC tablet Take 5 mg by mouth daily as needed for moderate constipation.   diazepam (VALIUM) 5 MG tablet Take 5 mg by mouth 3 (three) times daily as needed for anxiety.   docusate sodium (COLACE) 100 MG capsule Take 100 mg by mouth 2 (two) times daily.   esomeprazole (NEXIUM) 20 MG capsule Take 20 mg by mouth daily at 12 noon.   Fluticasone-Umeclidin-Vilant (TRELEGY ELLIPTA) 100-62.5-25 MCG/INH AEPB TAKE 1 PUFF BY MOUTH EVERY DAY   GRALISE 600 MG TABS Take 600 mg by mouth 3 (three) times daily.   hydrochlorothiazide (HYDRODIURIL) 25 MG tablet Take 25 mg by mouth daily.   Multiple Vitamin (MULTIVITAMIN) capsule Take 1 capsule by mouth daily.   nortriptyline (PAMELOR) 25 MG capsule Take 1 capsule (25 mg total) by mouth at bedtime.   Omega-3 Fatty Acids (FISH OIL) 1200 MG CAPS Take 1 capsule by mouth daily.   ondansetron (ZOFRAN) 4 MG tablet Take 1 tablet (4 mg total) by mouth every 8 (eight) hours as needed.   Oxycodone HCl 10  MG TABS Take 10 mg by mouth 3 (three) times daily as needed (breakthrough pain).   predniSONE (DELTASONE) 10 MG tablet Take 6 tablets x 1 day and then decrease by 1/2 tablet per day until down to zero mg.   sodium chloride (OCEAN) 0.65 % SOLN nasal spray Place 2 sprays into both nostrils daily as needed for congestion.   TIROSINT 137 MCG CAPS TAKE 1 CAPSULE BY MOUTH EVERY DAY BEFORE BREAKFAST   venlafaxine XR (EFFEXOR-XR) 75 MG 24 hr capsule Take 3 capsules in the morning   XTAMPZA ER 36 MG C12A Take 36 mg by mouth every 12 (twelve) hours.   zinc sulfate 220 (50 Zn) MG capsule Take 1 capsule (220 mg total) by mouth daily.   zolpidem  (AMBIEN) 10 MG tablet Take 10 mg by mouth at bedtime as needed for sleep.   [DISCONTINUED] metoprolol tartrate (LOPRESSOR) 50 MG tablet TAKE 1/2 TABLET BY MOUTH TWICE A DAY (NEED OFFICE VISIT)   [DISCONTINUED] pravastatin (PRAVACHOL) 10 MG tablet TAKE 1 TABLET BY MOUTH EVERY DAY   [DISCONTINUED] levofloxacin (LEVAQUIN) 750 MG tablet Take 1 tablet (750 mg total) by mouth daily. (Patient not taking: Reported on 02/23/2021)   [DISCONTINUED] predniSONE (DELTASONE) 20 MG tablet Take 2 tablets (40 mg total) by mouth daily with breakfast. X 5-10 days (Patient not taking: Reported on 02/23/2021)   [DISCONTINUED] triamcinolone (NASACORT AQ) 55 MCG/ACT AERO nasal inhaler Place 2 sprays into the nose 2 (two) times daily. 1 sprays each nostril twice a day   No facility-administered encounter medications on file as of 02/23/2021.     Lab Results  Component Value Date   WBC 8.4 09/07/2020   HGB 13.4 09/07/2020   HCT 40.0 09/07/2020   PLT 256 09/07/2020   GLUCOSE 74 02/23/2021   CHOL 202 (H) 02/23/2021   TRIG 217.0 (H) 02/23/2021   HDL 37.50 (L) 02/23/2021   LDLDIRECT 129.0 02/23/2021   LDLCALC 121 (H) 07/12/2016   ALT 20 02/23/2021   AST 22 02/23/2021   NA 142 02/23/2021   K 3.2 (L) 02/23/2021   CL 99 02/23/2021   CREATININE 1.06 02/23/2021   BUN 14 02/23/2021   CO2 35 (H) 02/23/2021   TSH 0.480 09/06/2020   HGBA1C 5.4 05/28/2020    DG Chest Port 1 View  Result Date: 09/03/2020 CLINICAL DATA:  Altered mental status EXAM: PORTABLE CHEST 1 VIEW COMPARISON:  March 19, 2019 FINDINGS: The heart size and mediastinal contours are within normal limits. Patchy airspace opacities are seen throughout the left lung and the periphery of the right upper lung. No pleural effusion. There is stable elevation of the right hemidiaphragm. Overlying spinal stimulator leads are seen. The visualized skeletal structures are unremarkable. IMPRESSION: Multifocal patchy airspace opacities left greater than right which could  be due to multifocal pneumonia Electronically Signed   By: Prudencio Pair M.D.   On: 09/03/2020 03:48       Assessment & Plan:   Problem List Items Addressed This Visit     Asthma    Increased cough and minimal wheezing as outlined.  Continue trelegy.  Has albuterol if needed.  Prednisone taper as directed.  mucinex as directed.  Follow.  Check cxr.        Relevant Medications   predniSONE (DELTASONE) 10 MG tablet   Dermatomyositis ([place])    Previously diagnosed with dermatomyositis.  Evaluated by rheumatology.         Dysphagia    Reports dysphagia.  Discussed taking small  bites and chewing food well.  Refer to GI for question of need for EGD. She will reschedule her appt.        Essential hypertension, benign    Continue amlodipine, hctz and metoprolol.  Blood pressure doing well.  Follow pressures.  Follow metabolic panel.        Relevant Orders   Basic metabolic panel (Completed)   GERD    Continue nexium.  Reports dysphagia.  Also due colonoscopy.  Was referred to GI.  Will reschedule appt.  Small bites.  Chew food well.  PPI.        History of colonic polyps    Colonoscopy 06/2015 - tubular adenoma x 2.  Recommended f/u in 06/2020.  Due.  She request referral to Dr Haig Prophet.  Did not keep appt.  Will reschedule.        History of COVID-19    Previous abnormal cxr.  Off oxygen now as outlined.  Treat acute flare.  Check cxr to confirm clearance.         Hypercholesterolemia    Low cholesterol diet and exercise.  Pravastatin.  Follow lipid panel and liver function tests.         Relevant Orders   Lipid panel (Completed)   Hepatic function panel (Completed)   Hypothyroidism    On tirosant.  Follow tsh.        Hypoxia    Was on oxygen.  Walk without oxygen today - 95% room air while walking.  Will d/c oxygen.         MDD (major depressive disorder), recurrent, in partial remission ([place])    Followed by psychiatry.  Continue effexor.         Other  Visit Diagnoses     Abnormality of lung on CXR    -  Primary   Relevant Orders   DG Chest 2 View (Completed)        Einar Pheasant, MD

## 2021-02-24 LAB — HEPATIC FUNCTION PANEL
ALT: 20 U/L (ref 0–35)
AST: 22 U/L (ref 0–37)
Albumin: 4.1 g/dL (ref 3.5–5.2)
Alkaline Phosphatase: 82 U/L (ref 39–117)
Bilirubin, Direct: 0.1 mg/dL (ref 0.0–0.3)
Total Bilirubin: 0.4 mg/dL (ref 0.2–1.2)
Total Protein: 6.4 g/dL (ref 6.0–8.3)

## 2021-02-24 LAB — LIPID PANEL
Cholesterol: 202 mg/dL — ABNORMAL HIGH (ref 0–200)
HDL: 37.5 mg/dL — ABNORMAL LOW (ref >39.00)
NonHDL: 164.23
Total CHOL/HDL Ratio: 5
Triglycerides: 217 mg/dL — ABNORMAL HIGH (ref 0.0–149.0)
VLDL: 43.4 mg/dL — ABNORMAL HIGH (ref 0.0–40.0)

## 2021-02-24 LAB — BASIC METABOLIC PANEL
BUN: 14 mg/dL (ref 6–23)
CO2: 35 mEq/L — ABNORMAL HIGH (ref 19–32)
Calcium: 9.2 mg/dL (ref 8.4–10.5)
Chloride: 99 mEq/L (ref 96–112)
Creatinine, Ser: 1.06 mg/dL (ref 0.40–1.20)
GFR: 56.5 mL/min — ABNORMAL LOW (ref >60.00)
Glucose, Bld: 74 mg/dL (ref 70–99)
Potassium: 3.2 mEq/L — ABNORMAL LOW (ref 3.5–5.1)
Sodium: 142 mEq/L (ref 135–145)

## 2021-02-24 LAB — LDL CHOLESTEROL, DIRECT: Direct LDL: 129 mg/dL

## 2021-02-25 ENCOUNTER — Telehealth: Payer: Medicare Other | Admitting: Psychiatry

## 2021-02-25 ENCOUNTER — Other Ambulatory Visit: Payer: Self-pay | Admitting: Internal Medicine

## 2021-02-25 ENCOUNTER — Other Ambulatory Visit: Payer: Self-pay

## 2021-02-25 DIAGNOSIS — I10 Essential (primary) hypertension: Secondary | ICD-10-CM

## 2021-02-25 MED ORDER — PRAVASTATIN SODIUM 20 MG PO TABS: ORAL_TABLET | ORAL | 1 refills | Status: DC

## 2021-02-25 MED ORDER — POTASSIUM CHLORIDE CRYS ER 10 MEQ PO TBCR: 10.0000 meq | EXTENDED_RELEASE_TABLET | Freq: Every day | ORAL | 0 refills | Status: DC

## 2021-03-01 ENCOUNTER — Encounter: Payer: Self-pay | Admitting: Internal Medicine

## 2021-03-01 NOTE — Assessment & Plan Note (Signed)
On tirosant.  Follow tsh.  

## 2021-03-01 NOTE — Assessment & Plan Note (Signed)
Increased cough and minimal wheezing as outlined.  Continue trelegy.  Has albuterol if needed.  Prednisone taper as directed.  mucinex as directed.  Follow.  Check cxr.

## 2021-03-01 NOTE — Assessment & Plan Note (Signed)
Continue amlodipine, hctz and metoprolol.  Blood pressure doing well.  Follow pressures.  Follow metabolic panel.

## 2021-03-01 NOTE — Assessment & Plan Note (Addendum)
Reports dysphagia.  Discussed taking small bites and chewing food well.  Refer to GI for question of need for EGD. She will reschedule her appt.

## 2021-03-01 NOTE — Assessment & Plan Note (Addendum)
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  Recommended f/u in 06/2020.  Due.  She request referral to Dr Haig Prophet.  Did not keep appt.  Will reschedule.

## 2021-03-01 NOTE — Assessment & Plan Note (Signed)
Followed by psychiatry.  Continue effexor.

## 2021-03-01 NOTE — Assessment & Plan Note (Signed)
Continue nexium.  Reports dysphagia.  Also due colonoscopy.  Was referred to GI.  Will reschedule appt.  Small bites.  Chew food well.  PPI.

## 2021-03-01 NOTE — Assessment & Plan Note (Signed)
Low cholesterol diet and exercise.  Pravastatin.  Follow lipid panel and liver function tests.   

## 2021-03-01 NOTE — Assessment & Plan Note (Signed)
Previously diagnosed with dermatomyositis.  Evaluated by rheumatology.   

## 2021-03-01 NOTE — Assessment & Plan Note (Signed)
Was on oxygen.  Walk without oxygen today - 95% room air while walking.  Will d/c oxygen.

## 2021-03-01 NOTE — Assessment & Plan Note (Signed)
Previous abnormal cxr.  Off oxygen now as outlined.  Treat acute flare.  Check cxr to confirm clearance.

## 2021-03-13 NOTE — Telephone Encounter (Signed)
I am confused.  She should still be on thyroid medication.  Need to make sure this is clarified.  Is she still taking?

## 2021-03-16 ENCOUNTER — Encounter: Payer: Self-pay | Admitting: Internal Medicine

## 2021-03-16 ENCOUNTER — Ambulatory Visit: Payer: Medicare Other | Admitting: Pulmonary Disease

## 2021-03-17 DIAGNOSIS — M62830 Muscle spasm of back: Secondary | ICD-10-CM | POA: Diagnosis not present

## 2021-03-17 DIAGNOSIS — G4700 Insomnia, unspecified: Secondary | ICD-10-CM | POA: Diagnosis not present

## 2021-03-17 DIAGNOSIS — G894 Chronic pain syndrome: Secondary | ICD-10-CM | POA: Diagnosis not present

## 2021-03-17 DIAGNOSIS — M47817 Spondylosis without myelopathy or radiculopathy, lumbosacral region: Secondary | ICD-10-CM | POA: Diagnosis not present

## 2021-03-20 ENCOUNTER — Other Ambulatory Visit: Payer: Self-pay | Admitting: Internal Medicine

## 2021-03-22 NOTE — Telephone Encounter (Signed)
She is still taking medication. Per chart review, it was switched to tirosint on 01/18/21 due to insurance preference. Refill not due until September.

## 2021-04-14 ENCOUNTER — Other Ambulatory Visit: Payer: Self-pay | Admitting: Internal Medicine

## 2021-04-14 DIAGNOSIS — M62830 Muscle spasm of back: Secondary | ICD-10-CM | POA: Diagnosis not present

## 2021-04-14 DIAGNOSIS — M47817 Spondylosis without myelopathy or radiculopathy, lumbosacral region: Secondary | ICD-10-CM | POA: Diagnosis not present

## 2021-04-14 DIAGNOSIS — G4700 Insomnia, unspecified: Secondary | ICD-10-CM | POA: Diagnosis not present

## 2021-04-14 DIAGNOSIS — G894 Chronic pain syndrome: Secondary | ICD-10-CM | POA: Diagnosis not present

## 2021-04-29 ENCOUNTER — Ambulatory Visit: Payer: Medicare Other | Admitting: Pulmonary Disease

## 2021-05-01 ENCOUNTER — Other Ambulatory Visit: Payer: Self-pay | Admitting: Internal Medicine

## 2021-05-06 ENCOUNTER — Ambulatory Visit: Payer: Medicare Other

## 2021-05-10 ENCOUNTER — Telehealth: Payer: Self-pay | Admitting: Internal Medicine

## 2021-05-10 NOTE — Telephone Encounter (Signed)
Received notification that she is overdue mammogram. Please notify pt and schedule mammogram.

## 2021-05-10 NOTE — Telephone Encounter (Signed)
LM for pt to confirm ok to schedule mammogram

## 2021-05-12 DIAGNOSIS — G4700 Insomnia, unspecified: Secondary | ICD-10-CM | POA: Diagnosis not present

## 2021-05-12 DIAGNOSIS — M62830 Muscle spasm of back: Secondary | ICD-10-CM | POA: Diagnosis not present

## 2021-05-12 DIAGNOSIS — M47817 Spondylosis without myelopathy or radiculopathy, lumbosacral region: Secondary | ICD-10-CM | POA: Diagnosis not present

## 2021-05-12 DIAGNOSIS — G894 Chronic pain syndrome: Secondary | ICD-10-CM | POA: Diagnosis not present

## 2021-05-13 ENCOUNTER — Other Ambulatory Visit: Payer: Self-pay | Admitting: Internal Medicine

## 2021-05-21 NOTE — Telephone Encounter (Signed)
LM for patient and noted to discuss mammogram at upcoming appt

## 2021-06-03 ENCOUNTER — Other Ambulatory Visit: Payer: Self-pay | Admitting: Internal Medicine

## 2021-06-03 DIAGNOSIS — J45901 Unspecified asthma with (acute) exacerbation: Secondary | ICD-10-CM

## 2021-06-09 ENCOUNTER — Telehealth: Payer: Medicare Other | Admitting: Psychiatry

## 2021-06-09 DIAGNOSIS — M62830 Muscle spasm of back: Secondary | ICD-10-CM | POA: Diagnosis not present

## 2021-06-09 DIAGNOSIS — G894 Chronic pain syndrome: Secondary | ICD-10-CM | POA: Diagnosis not present

## 2021-06-09 DIAGNOSIS — G4700 Insomnia, unspecified: Secondary | ICD-10-CM | POA: Diagnosis not present

## 2021-06-09 DIAGNOSIS — M47817 Spondylosis without myelopathy or radiculopathy, lumbosacral region: Secondary | ICD-10-CM | POA: Diagnosis not present

## 2021-06-11 ENCOUNTER — Other Ambulatory Visit: Payer: Self-pay | Admitting: Internal Medicine

## 2021-06-15 ENCOUNTER — Ambulatory Visit: Payer: Medicare Other | Admitting: Pulmonary Disease

## 2021-06-22 ENCOUNTER — Telehealth: Payer: Self-pay | Admitting: Internal Medicine

## 2021-06-22 NOTE — Telephone Encounter (Signed)
Noted on schedule for 11/15 appt

## 2021-06-22 NOTE — Telephone Encounter (Signed)
Received notification that she is overdue mammogram.  Need to schedule.

## 2021-06-29 ENCOUNTER — Encounter: Payer: Medicare Other | Admitting: Internal Medicine

## 2021-06-29 NOTE — Progress Notes (Signed)
Virtual Visit via Telephone Note  I connected with [name] on 07/01/21 at  3:30 PM EST by telephone and verified that I am speaking with the correct person using two identifiers.  Location: Patient: home Provider: office Persons participated in the visit- patient, provider    I discussed the limitations, risks, security and privacy concerns of performing an evaluation and management service by telephone and the availability of in person appointments. I also discussed with the patient that there may be a patient responsible charge related to this service. The patient expressed understanding and agreed to proceed.   I discussed the assessment and treatment plan with the patient. The patient was provided an opportunity to ask questions and all were answered. The patient agreed with the plan and demonstrated an understanding of the instructions.   The patient was advised to call back or seek an in-person evaluation if the symptoms worsen or if the condition fails to improve as anticipated.  I provided 12 minutes of non-face-to-face time during this encounter.   Norman Clay, MD    [facility] MD/PA/NP OP Progress Note  07/01/2021 3:59 PM [name]  MRN:  [id number]  Chief Complaint:  Chief Complaint   Follow-up; Depression    HPI:  This is a follow-up appointment for depression and anxiety.  She has not been seen since 2023-02-04.  She states that she feels sick due to sinus infection and UTI.  She has been feeling more down lately, which she attributes to holiday season with loss of her mother a few years ago.  She also has constant pain, which makes it difficult for her to to go outside.  Although she lost her dog in July, she has been doing better.  She also had another dog, and enjoys being with his dog.  She reports good relationship with her husband.  She believes nortriptyline has helped her for her mood; she was more relaxed and was not anxious.  She did not notice any side effect  from the medication.  She has insomnia.  She feels fatigue.  She has difficulty in concentration.  She denies change in appetite.  She denies SI.  She is willing to restart nortriptyline.   Daily routine: Exercise: Employment: unemployed, disability due to back pain s/p six back surgery Support: Household:  Husband, grandchild (age 39. Moved in since age 29 to help patient and her husband for transportation) Marital status: married Number of children: 1 daughter ("really good' relationship) She has "wonderful" parents. Her mother deceased 2020-02-04. He is a Theme park manager  Visit Diagnosis:    ICD-10-CM   1. MDD (major depressive disorder), recurrent episode, mild ([place])  F33.0     2. Anxiety state  F41.1       Past Psychiatric History: Please see initial evaluation for full details. I have reviewed the history. No updates at this time.     Past Medical History:  Past Medical History:  Diagnosis Date   Allergic rhinitis    Anxiety    Arthralgia    Asthma    Bronchitis, chronic (HCC)    Chronic back pain    Collagenous colitis    followed by Dr Tiffany Kocher   COVID-19    09/03/20 with pneumonia   Depression    Diplopia 11/15/2013   Dysphagia    Endometriosis    Fibrocystic breast disease    GERD (gastroesophageal reflux disease)    History of colon polyps    Hyperlipidemia    Hypertension  Hypokalemia    IBS (irritable bowel syndrome)    Memory difficulty 11/15/2013   Migraine headache    Pneumonia 2017   x2   PONV (postoperative nausea and vomiting)    used patch   Sleep apnea    recommended CPAP   Ulcer disease    Urine incontinence     Past Surgical History:  Procedure Laterality Date   ABDOMINAL HYSTERECTOMY     endometriosis   BACK SURGERY     x6   BREAST BIOPSY Right    COLONOSCOPY WITH PROPOFOL N/A 06/25/2015   Procedure: COLONOSCOPY WITH PROPOFOL;  Surgeon: Manya Silvas, MD;  Location: [facility] ENDOSCOPY;  Service: Endoscopy;  Laterality: N/A;   DILATION AND  CURETTAGE OF UTERUS     and hysteroscopy   ESOPHAGOGASTRODUODENOSCOPY N/A 06/25/2015   Procedure: ESOPHAGOGASTRODUODENOSCOPY (EGD);  Surgeon: Manya Silvas, MD;  Location: [facility] ENDOSCOPY;  Service: Endoscopy;  Laterality: N/A;   insertion of permanent spinal cord stimulator     insertion of trial spinal cord stimulator     LUMBAR DISC SURGERY     LUMBAR FUSION  4/05 and 8/07   LUMBAR MICRODISCECTOMY  03/28/03   L4-5 L5   MOUTH SURGERY     NASAL SEPTOPLASTY W/ TURBINOPLASTY     removal of spinal cord stimulator     SEPTOPLASTY     with reduction of turbinates   THYROIDECTOMY N/A 06/06/2016   Procedure: THYROIDECTOMY;  Surgeon: Robert Bellow, MD;  Location: [facility] ORS;  Service: General;  Laterality: N/A;    Family Psychiatric History: Please see initial evaluation for full details. I have reviewed the history. No updates at this time.     Family History:  Family History  Problem Relation Age of Onset   Allergies Mother    Hypertension Mother    Arthritis Mother    Hyperlipidemia Mother    Stroke Mother    Hypertension Father    Arthritis Father    Diabetes Father    Hypertension Brother    Diabetes Brother    Arthritis Maternal Grandmother    Hyperlipidemia Maternal Grandmother    Hyperlipidemia Maternal Grandfather    Arthritis Paternal Grandmother    Arthritis Paternal Grandfather    Asthma Other        several family member   Leukemia Other        uncle   Lymphoma Maternal Aunt        non hodgkins    Social History:  Social History   Socioeconomic History   Marital status: Married    Spouse name: douglas   Number of children: 1   Years of education: 45 th   Highest education level: High school graduate  Occupational History   Occupation: Disabled    Employer: DISABLED  Tobacco Use   Smoking status: Never   Smokeless tobacco: Never  Vaping Use   Vaping Use: Never used  Substance and Sexual Activity   Alcohol use: No    Alcohol/week: 0.0  standard drinks   Drug use: No   Sexual activity: Not Currently  Other Topics Concern   Not on file  Social History Narrative   ** Merged History Encounter **       Social Determinants of Health   Financial Resource Strain: Not on file  Food Insecurity: Not on file  Transportation Needs: Not on file  Physical Activity: Not on file  Stress: Not on file  Social Connections: Not on file    Allergies:  Allergies  Allergen Reactions   Fentanyl Other (See Comments)    Other reaction(s): Headache, Other (See Comments) headache SEVERE HEADACHE headache   Sumatriptan Other (See Comments)    Other reaction(s): Headache Severe headache   Doxycycline Other (See Comments)    Other reaction(s): Headache, Other (See Comments) REACTION: causes severe abd pain REACTION: causes severe abd pain REACTION: causes severe abd pain   Montelukast Other (See Comments)    SEVERE HEADACHE   Oxymorphone Other (See Comments)    REACTION: severe headaches   Benzoin Rash   Buspirone Other (See Comments)    SEVERE HEADACHE   Ciprofloxacin Nausea Only   Conjugated Estrogens Other (See Comments)    headaches   Cymbalta [Duloxetine Hcl] Other (See Comments)    Headache   Duloxetine Other (See Comments)    Headache   Erythromycin Ethylsuccinate Other (See Comments)    Stomach ache   Estradiol Other (See Comments)    headache   Metronidazole Other (See Comments)    abd pain   Mirtazapine Other (See Comments)    headache    Metabolic Disorder Labs: Lab Results  Component Value Date   HGBA1C 5.4 05/28/2020   No results found for: PROLACTIN Lab Results  Component Value Date   CHOL 202 (H) 02/23/2021   TRIG 217.0 (H) 02/23/2021   HDL 37.50 (L) 02/23/2021   CHOLHDL 5 02/23/2021   VLDL 43.4 (H) 02/23/2021   LDLCALC 121 (H) 07/12/2016   LDLCALC 187 (H) 08/03/2015   Lab Results  Component Value Date   TSH 0.480 09/06/2020   TSH 0.590 09/04/2020    Therapeutic Level Labs: No  results found for: LITHIUM No results found for: VALPROATE No components found for:  CBMZ  Current Medications: Current Outpatient Medications  Medication Sig Dispense Refill   albuterol (PROVENTIL) (2.5 MG/3ML) 0.083% nebulizer solution Take 3 mLs (2.5 mg total) by nebulization every 4 (four) hours as needed for wheezing or shortness of breath. 360 mL 2   albuterol (VENTOLIN HFA) 108 (90 Base) MCG/ACT inhaler Inhale 2 puffs into the lungs every 4 (four) hours as needed for wheezing or shortness of breath. 1 each 11   amLODipine (NORVASC) 5 MG tablet TAKE 1 TABLET BY MOUTH EVERY DAY 90 tablet 1   ascorbic acid (VITAMIN C) 500 MG tablet Take 1 tablet (500 mg total) by mouth daily. 30 tablet 0   bisacodyl (DULCOLAX) 5 MG EC tablet Take 5 mg by mouth daily as needed for moderate constipation.     diazepam (VALIUM) 5 MG tablet Take 5 mg by mouth 3 (three) times daily as needed for anxiety.     docusate sodium (COLACE) 100 MG capsule Take 100 mg by mouth 2 (two) times daily.     esomeprazole (NEXIUM) 20 MG capsule Take 20 mg by mouth daily at 12 noon.     GRALISE 600 MG TABS Take 600 mg by mouth 3 (three) times daily.     hydrochlorothiazide (HYDRODIURIL) 25 MG tablet Take 25 mg by mouth daily.  11   KLOR-CON M10 10 MEQ tablet TAKE 1 TABLET BY MOUTH EVERY DAY 30 tablet 0   metoprolol tartrate (LOPRESSOR) 50 MG tablet TAKE 1/2 TABLET BY MOUTH TWICE A DAY (NEED OFFICE VISIT) 90 tablet 0   Multiple Vitamin (MULTIVITAMIN) capsule Take 1 capsule by mouth daily.     nortriptyline (PAMELOR) 25 MG capsule Take 1 capsule (25 mg total) by mouth at bedtime. 30 capsule 1   Omega-3 Fatty Acids (  FISH OIL) 1200 MG CAPS Take 1 capsule by mouth daily.     ondansetron (ZOFRAN) 4 MG tablet Take 1 tablet (4 mg total) by mouth every 8 (eight) hours as needed. 40 tablet 0   Oxycodone HCl 10 MG TABS Take 10 mg by mouth 3 (three) times daily as needed (breakthrough pain).     pravastatin (PRAVACHOL) 20 MG tablet TAKE 1  TABLET BY MOUTH EVERY DAY 90 tablet 1   predniSONE (DELTASONE) 10 MG tablet Take 6 tablets x 1 day and then decrease by 1/2 tablet per day until down to zero mg. 39 tablet 0   sodium chloride (OCEAN) 0.65 % SOLN nasal spray Place 2 sprays into both nostrils daily as needed for congestion. 30 mL 2   TIROSINT 137 MCG CAPS TAKE 1 CAPSULE BY MOUTH EVERY DAY BEFORE BREAKFAST 30 capsule 2   TRELEGY ELLIPTA 100-62.5-25 MCG/ACT AEPB INHALE 1 PUFF BY MOUTH EVERY DAY 60 each 3   venlafaxine XR (EFFEXOR-XR) 75 MG 24 hr capsule Take 3 capsules in the morning 270 capsule 0   XTAMPZA ER 36 MG C12A Take 36 mg by mouth every 12 (twelve) hours.  0   zinc sulfate 220 (50 Zn) MG capsule Take 1 capsule (220 mg total) by mouth daily. 30 capsule 0   zolpidem (AMBIEN) 10 MG tablet Take 10 mg by mouth at bedtime as needed for sleep.     No current facility-administered medications for this visit.     Musculoskeletal: Strength & Muscle Tone:  N/A Gait & Station:  N/A Patient leans: N/A  Psychiatric Specialty Exam: Review of Systems  Psychiatric/Behavioral:  Positive for decreased concentration, dysphoric mood and sleep disturbance. Negative for agitation, behavioral problems, confusion, hallucinations, self-injury and suicidal ideas. The patient is nervous/anxious. The patient is not hyperactive.   All other systems reviewed and are negative.  There were no vitals taken for this visit.There is no height or weight on file to calculate BMI.  General Appearance: NA  Eye Contact:  NA  Speech:  Clear and Coherent  Volume:  Normal  Mood:  Depressed  Affect:  NA  Thought Process:  Coherent  Orientation:  Full (Time, Place, and Person)  Thought Content: Logical   Suicidal Thoughts:  No  Homicidal Thoughts:  No  Memory:  Immediate;   Good  Judgement:  Good  Insight:  Fair  Psychomotor Activity:  Normal  Concentration:  Concentration: Good and Attention Span: Good  Recall:  Good  Fund of Knowledge: Good   Language: Good  Akathisia:  No  Handed:  Right  AIMS (if indicated): not done  Assets:  Communication Skills Desire for Improvement  ADL's:  Intact  Cognition: WNL  Sleep:  Poor   Screenings: PHQ2-9    Flowsheet Row Office Visit from 02/23/2021 in [place] Video Visit from 11/05/2020 in [place] Office Visit from 06/05/2020 in [place] from 05/05/2020 in [place] Office Visit from 04/30/2019 in [place]  PHQ-2 Total Score 4 0 5 1 4   PHQ-9 Total Score 12 0 20 -- 14      Flowsheet Row Video Visit from 01/14/2021 in [place] ED to Hosp-Admission (Discharged) from 09/03/2020 in [place] (1C) Office Visit from 06/05/2020 in [place] No Risk Error: Question 2 not populated        Assessment and  Plan:  [name] is a 60 y.o. year old female with a history of  depression, anxiety,  HTN, HL, dysphasia, asthma. migraine, sleep apnea, s/p covid pneumonia in 08/2020, who presents for follow up appointment for below.   1. MDD (major depressive disorder), recurrent episode, mild ([place]) 2. Anxiety state She reports worsening in depressive symptoms and anxiety in the setting of non adherent to nortiptyline. Psychosocial stressors includes chronic pain, and loss of her mother in June 2021.  Will restart nortriptyline to target depression, anxiety and insomnia.  Will continue venlafaxine to target depression and anxiety.   This clinician has discussed the side effect associated with medication prescribed during this encounter. Please refer to notes in the previous encounters for more details.     Plan 1. Continue venlafaxine 225 mg daily 2.  Restart nortriptyline 25 mg at night  3. Next appointment: 1/12 at 4 Pm for 30 mins, video - on Ambien 10 mg at  night, valium 5 mg TID   The patient demonstrates the following risk factors for suicide: Chronic risk factors for suicide include: psychiatric disorder of depression. Acute risk factors for suicide include: unemployment. Protective factors for this patient include: positive social support, coping skills and hope for the future. Considering these factors, the overall suicide risk at this point appears to be low. Patient is appropriate for outpatient follow up.  Norman Clay, MD 07/01/2021, 3:59 PM

## 2021-07-01 ENCOUNTER — Other Ambulatory Visit: Payer: Self-pay

## 2021-07-01 ENCOUNTER — Telehealth (INDEPENDENT_AMBULATORY_CARE_PROVIDER_SITE_OTHER): Payer: Medicare Other | Admitting: Psychiatry

## 2021-07-01 ENCOUNTER — Encounter: Payer: Self-pay | Admitting: Psychiatry

## 2021-07-01 DIAGNOSIS — F411 Generalized anxiety disorder: Secondary | ICD-10-CM

## 2021-07-01 DIAGNOSIS — F330 Major depressive disorder, recurrent, mild: Secondary | ICD-10-CM

## 2021-07-01 MED ORDER — NORTRIPTYLINE HCL 25 MG PO CAPS: 25.0000 mg | ORAL_CAPSULE | Freq: Every day | ORAL | 1 refills | Status: DC

## 2021-07-01 MED ORDER — VENLAFAXINE HCL ER 75 MG PO CP24: 225.0000 mg | ORAL_CAPSULE | Freq: Every day | ORAL | 0 refills | Status: DC

## 2021-07-01 NOTE — Patient Instructions (Signed)
1. Continue venlafaxine 225 mg daily 2.  Restart nortriptyline 25 mg at night  3. Next appointment: 1/12 at 4 Pm

## 2021-07-07 DIAGNOSIS — M47817 Spondylosis without myelopathy or radiculopathy, lumbosacral region: Secondary | ICD-10-CM | POA: Diagnosis not present

## 2021-07-07 DIAGNOSIS — G4700 Insomnia, unspecified: Secondary | ICD-10-CM | POA: Diagnosis not present

## 2021-07-07 DIAGNOSIS — G894 Chronic pain syndrome: Secondary | ICD-10-CM | POA: Diagnosis not present

## 2021-07-07 DIAGNOSIS — Z79891 Long term (current) use of opiate analgesic: Secondary | ICD-10-CM | POA: Diagnosis not present

## 2021-07-07 DIAGNOSIS — M62830 Muscle spasm of back: Secondary | ICD-10-CM | POA: Diagnosis not present

## 2021-07-11 ENCOUNTER — Other Ambulatory Visit: Payer: Self-pay | Admitting: Internal Medicine

## 2021-07-19 ENCOUNTER — Other Ambulatory Visit: Payer: Self-pay | Admitting: Internal Medicine

## 2021-07-23 ENCOUNTER — Other Ambulatory Visit: Payer: Self-pay | Admitting: Psychiatry

## 2021-07-26 ENCOUNTER — Other Ambulatory Visit: Payer: Self-pay | Admitting: Internal Medicine

## 2021-08-10 DIAGNOSIS — M47817 Spondylosis without myelopathy or radiculopathy, lumbosacral region: Secondary | ICD-10-CM | POA: Diagnosis not present

## 2021-08-10 DIAGNOSIS — M62830 Muscle spasm of back: Secondary | ICD-10-CM | POA: Diagnosis not present

## 2021-08-10 DIAGNOSIS — G4700 Insomnia, unspecified: Secondary | ICD-10-CM | POA: Diagnosis not present

## 2021-08-10 DIAGNOSIS — G894 Chronic pain syndrome: Secondary | ICD-10-CM | POA: Diagnosis not present

## 2021-08-11 ENCOUNTER — Other Ambulatory Visit: Payer: Self-pay | Admitting: Internal Medicine

## 2021-08-11 DIAGNOSIS — L309 Dermatitis, unspecified: Secondary | ICD-10-CM | POA: Diagnosis not present

## 2021-08-11 DIAGNOSIS — D225 Melanocytic nevi of trunk: Secondary | ICD-10-CM | POA: Diagnosis not present

## 2021-08-11 DIAGNOSIS — D2272 Melanocytic nevi of left lower limb, including hip: Secondary | ICD-10-CM | POA: Diagnosis not present

## 2021-08-22 ENCOUNTER — Other Ambulatory Visit: Payer: Self-pay | Admitting: Psychiatry

## 2021-08-25 NOTE — Progress Notes (Signed)
Virtual Visit via Video Note  I connected with [name] on 08/26/21 at  4:00 PM EST by a video enabled telemedicine application and verified that I am speaking with the correct person using two identifiers.  Location: Patient: home Provider: office Persons participated in the visit- patient, provider    I discussed the limitations of evaluation and management by telemedicine and the availability of in person appointments. The patient expressed understanding and agreed to proceed.  I discussed the assessment and treatment plan with the patient. The patient was provided an opportunity to ask questions and all were answered. The patient agreed with the plan and demonstrated an understanding of the instructions.   The patient was advised to call back or seek an in-person evaluation if the symptoms worsen or if the condition fails to improve as anticipated.  I provided 18 minutes of non-face-to-face time during this encounter.   [name] Clay, MD    [facility] MD/PA/NP OP Progress Note  08/26/2021 4:32 PM STANISHA Petersen  MRN:  [id number]  Chief Complaint:  Chief Complaint   Follow-up; Depression    HPI:  This is a follow-up appointment for depression and an anxiety.  She states that she is having worsening in back pain and abdominal pain.  She also reports weight loss of 17 months over the last five months, which she attributes to appetite loss.  She agrees to contact her PCP for further evaluation.  She states that she has seen many providers, including GI, rheumatologist.  She will also see ENT for her sinus.  She states that she was able to see her father on Thanksgiving.  He is doing relatively well.  She was sick on Christmas.  Her husband has been busy, and is out for work most of the time.  However, she reports good relationship with him when they are together.  Her grandson is doing well.  He works with her husband.  Although she states that her mood has been better since being back  on nortriptyline, she still has depressive symptoms as in PHQ-9.  She denies anxiety or panic attacks.  She is interested in up titration of nortriptyline.   Employment: unemployed, disability due to back pain s/p six back surgery Support: husband Household:  Husband, grandchild (age 44. Moved in since age 28 to help patient and her husband for transportation) Marital status: married Number of children: 1 daughter ("really good' relationship) She has "wonderful" parents. Her mother deceased 02-19-20. He is a Theme park manager  Visit Diagnosis:    ICD-10-CM   1. MDD (major depressive disorder), recurrent episode, moderate (HCC)  F33.1     2. Anxiety state  F41.1       Past Psychiatric History: Please see initial evaluation for full details. I have reviewed the history. No updates at this time.     Past Medical History:  Past Medical History:  Diagnosis Date   Allergic rhinitis    Anxiety    Arthralgia    Asthma    Bronchitis, chronic (HCC)    Chronic back pain    Collagenous colitis    followed by Dr Tiffany Kocher   COVID-19    09/03/20 with pneumonia   Depression    Diplopia 11/15/2013   Dysphagia    Endometriosis    Fibrocystic breast disease    GERD (gastroesophageal reflux disease)    History of colon polyps    Hyperlipidemia    Hypertension    Hypokalemia    IBS (irritable  bowel syndrome)    Memory difficulty 11/15/2013   Migraine headache    Pneumonia 2017   x2   PONV (postoperative nausea and vomiting)    used patch   Sleep apnea    recommended CPAP   Ulcer disease    Urine incontinence     Past Surgical History:  Procedure Laterality Date   ABDOMINAL HYSTERECTOMY     endometriosis   BACK SURGERY     x6   BREAST BIOPSY Right    COLONOSCOPY WITH PROPOFOL N/A 06/25/2015   Procedure: COLONOSCOPY WITH PROPOFOL;  Surgeon: Manya Silvas, MD;  Location: [facility] ENDOSCOPY;  Service: Endoscopy;  Laterality: N/A;   DILATION AND CURETTAGE OF UTERUS     and hysteroscopy    ESOPHAGOGASTRODUODENOSCOPY N/A 06/25/2015   Procedure: ESOPHAGOGASTRODUODENOSCOPY (EGD);  Surgeon: Manya Silvas, MD;  Location: [facility] ENDOSCOPY;  Service: Endoscopy;  Laterality: N/A;   insertion of permanent spinal cord stimulator     insertion of trial spinal cord stimulator     LUMBAR DISC SURGERY     LUMBAR FUSION  4/05 and 8/07   LUMBAR MICRODISCECTOMY  03/28/03   L4-5 L5   MOUTH SURGERY     NASAL SEPTOPLASTY W/ TURBINOPLASTY     removal of spinal cord stimulator     SEPTOPLASTY     with reduction of turbinates   THYROIDECTOMY N/A 06/06/2016   Procedure: THYROIDECTOMY;  Surgeon: Robert Bellow, MD;  Location: [facility] ORS;  Service: General;  Laterality: N/A;    Family Psychiatric History: Please see initial evaluation for full details. I have reviewed the history. No updates at this time.     Family History:  Family History  Problem Relation Age of Onset   Allergies Mother    Hypertension Mother    Arthritis Mother    Hyperlipidemia Mother    Stroke Mother    Hypertension Father    Arthritis Father    Diabetes Father    Hypertension Brother    Diabetes Brother    Arthritis Maternal Grandmother    Hyperlipidemia Maternal Grandmother    Hyperlipidemia Maternal Grandfather    Arthritis Paternal Grandmother    Arthritis Paternal Grandfather    Asthma Other        several family member   Leukemia Other        uncle   Lymphoma Maternal Aunt        non hodgkins    Social History:  Social History   Socioeconomic History   Marital status: Married    Spouse name: douglas   Number of children: 1   Years of education: 43 th   Highest education level: High school graduate  Occupational History   Occupation: Disabled    Employer: DISABLED  Tobacco Use   Smoking status: Never   Smokeless tobacco: Never  Vaping Use   Vaping Use: Never used  Substance and Sexual Activity   Alcohol use: No    Alcohol/week: 0.0 standard drinks   Drug use: No   Sexual activity:  Not Currently  Other Topics Concern   Not on file  Social History Narrative   ** Merged History Encounter **       Social Determinants of Health   Financial Resource Strain: Not on file  Food Insecurity: Not on file  Transportation Needs: Not on file  Physical Activity: Not on file  Stress: Not on file  Social Connections: Not on file    Allergies:  Allergies  Allergen Reactions  Fentanyl Other (See Comments)    Other reaction(s): Headache, Other (See Comments) headache SEVERE HEADACHE headache   Sumatriptan Other (See Comments)    Other reaction(s): Headache Severe headache   Doxycycline Other (See Comments)    Other reaction(s): Headache, Other (See Comments) REACTION: causes severe abd pain REACTION: causes severe abd pain REACTION: causes severe abd pain   Montelukast Other (See Comments)    SEVERE HEADACHE   Oxymorphone Other (See Comments)    REACTION: severe headaches   Benzoin Rash   Buspirone Other (See Comments)    SEVERE HEADACHE   Ciprofloxacin Nausea Only   Conjugated Estrogens Other (See Comments)    headaches   Cymbalta [Duloxetine Hcl] Other (See Comments)    Headache   Duloxetine Other (See Comments)    Headache   Erythromycin Ethylsuccinate Other (See Comments)    Stomach ache   Estradiol Other (See Comments)    headache   Metronidazole Other (See Comments)    abd pain   Mirtazapine Other (See Comments)    headache    Metabolic Disorder Labs: Lab Results  Component Value Date   HGBA1C 5.4 05/28/2020   No results found for: PROLACTIN Lab Results  Component Value Date   CHOL 202 (H) 02/23/2021   TRIG 217.0 (H) 02/23/2021   HDL 37.50 (L) 02/23/2021   CHOLHDL 5 02/23/2021   VLDL 43.4 (H) 02/23/2021   LDLCALC 121 (H) 07/12/2016   LDLCALC 187 (H) 08/03/2015   Lab Results  Component Value Date   TSH 0.480 09/06/2020   TSH 0.590 09/04/2020    Therapeutic Level Labs: No results found for: LITHIUM No results found for:  VALPROATE No components found for:  CBMZ  Current Medications: Current Outpatient Medications  Medication Sig Dispense Refill   albuterol (PROVENTIL) (2.5 MG/3ML) 0.083% nebulizer solution Take 3 mLs (2.5 mg total) by nebulization every 4 (four) hours as needed for wheezing or shortness of breath. 360 mL 2   albuterol (VENTOLIN HFA) 108 (90 Base) MCG/ACT inhaler Inhale 2 puffs into the lungs every 4 (four) hours as needed for wheezing or shortness of breath. 1 each 11   amLODipine (NORVASC) 5 MG tablet TAKE 1 TABLET BY MOUTH EVERY DAY 90 tablet 1   ascorbic acid (VITAMIN C) 500 MG tablet Take 1 tablet (500 mg total) by mouth daily. 30 tablet 0   bisacodyl (DULCOLAX) 5 MG EC tablet Take 5 mg by mouth daily as needed for moderate constipation.     diazepam (VALIUM) 5 MG tablet Take 5 mg by mouth 3 (three) times daily as needed for anxiety.     docusate sodium (COLACE) 100 MG capsule Take 100 mg by mouth 2 (two) times daily.     esomeprazole (NEXIUM) 20 MG capsule Take 20 mg by mouth daily at 12 noon.     GRALISE 600 MG TABS Take 600 mg by mouth 3 (three) times daily.     hydrochlorothiazide (HYDRODIURIL) 25 MG tablet Take 25 mg by mouth daily.  11   KLOR-CON M10 10 MEQ tablet TAKE 1 TABLET BY MOUTH EVERY DAY 30 tablet 0   metoprolol tartrate (LOPRESSOR) 50 MG tablet TAKE 1/2 TABLET BY MOUTH TWICE A DAY (NEED OFFICE VISIT) 90 tablet 0   Multiple Vitamin (MULTIVITAMIN) capsule Take 1 capsule by mouth daily.     nortriptyline (PAMELOR) 50 MG capsule Take 1 capsule (50 mg total) by mouth at bedtime. 30 capsule 1   Omega-3 Fatty Acids (FISH OIL) 1200 MG CAPS Take  1 capsule by mouth daily.     ondansetron (ZOFRAN) 4 MG tablet Take 1 tablet (4 mg total) by mouth every 8 (eight) hours as needed. 40 tablet 0   Oxycodone HCl 10 MG TABS Take 10 mg by mouth 3 (three) times daily as needed (breakthrough pain).     pravastatin (PRAVACHOL) 20 MG tablet TAKE 1 TABLET BY MOUTH EVERY DAY 90 tablet 1   predniSONE  (DELTASONE) 10 MG tablet Take 6 tablets x 1 day and then decrease by 1/2 tablet per day until down to zero mg. 39 tablet 0   sodium chloride (OCEAN) 0.65 % SOLN nasal spray Place 2 sprays into both nostrils daily as needed for congestion. 30 mL 2   TIROSINT 137 MCG CAPS TAKE 1 CAPSULE BY MOUTH EVERY DAY BEFORE BREAKFAST 30 capsule 2   TRELEGY ELLIPTA 100-62.5-25 MCG/ACT AEPB INHALE 1 PUFF BY MOUTH EVERY DAY 60 each 3   venlafaxine XR (EFFEXOR-XR) 75 MG 24 hr capsule Take 3 capsules (225 mg total) by mouth daily with breakfast. 270 capsule 0   XTAMPZA ER 36 MG C12A Take 36 mg by mouth every 12 (twelve) hours.  0   zinc sulfate 220 (50 Zn) MG capsule Take 1 capsule (220 mg total) by mouth daily. 30 capsule 0   zolpidem (AMBIEN) 10 MG tablet Take 10 mg by mouth at bedtime as needed for sleep.     No current facility-administered medications for this visit.     Musculoskeletal: Strength & Muscle Tone:  N/A Gait & Station:  N/A Patient leans: N/A  Psychiatric Specialty Exam: Review of Systems  There were no vitals taken for this visit.There is no height or weight on file to calculate BMI.  General Appearance: Fairly Groomed  Eye Contact:  Good  Speech:  Clear and Coherent  Volume:  Normal  Mood:   good  Affect:  Appropriate, Congruent, and calm  Thought Process:  Coherent  Orientation:  Full (Time, Place, and Person)  Thought Content: Logical   Suicidal Thoughts:  No  Homicidal Thoughts:  No  Memory:  Immediate;   Good  Judgement:  Good  Insight:  Good  Psychomotor Activity:  Normal  Concentration:  Concentration: Good and Attention Span: Good  Recall:  Good  Fund of Knowledge: Good  Language: Good  Akathisia:  No  Handed:  Right  AIMS (if indicated): not done  Assets:  Communication Skills Desire for Improvement  ADL's:  Intact  Cognition: WNL  Sleep:  Fair   Screenings: PHQ2-9    Flowsheet Row Video Visit from 08/26/2021 in [place] Visit from 02/23/2021 in [place] Video Visit from 11/05/2020 in [place] Office Visit from 06/05/2020 in [facility] from 05/05/2020 in [place]  PHQ-2 Total Score 2 4 0 5 1  PHQ-9 Total Score 14 12 0 20 --      Flowsheet Row Video Visit from 08/26/2021 in [place] Video Visit from 01/14/2021 in [place] [facility] to Hosp-Admission (Discharged) from 09/03/2020 in [place] (1C)  C-SSRS RISK CATEGORY Error: Q3, 4, or 5 should not be populated when Q2 is No Low Risk No Risk        Assessment and Plan:  VALINDA FEDIE is a 61 y.o. year old female with a history of depression, anxiety,  HTN, HL, dysphasia, asthma. migraine, sleep apnea, s/p covid pneumonia  in 08/2020, who presents for follow up appointment for below.    1. MDD (major depressive disorder), recurrent episode, moderate ([place]) 2. Anxiety state There has been overall improvement in depressive symptoms and an anxiety since restarting the nortriptyline.  Psychosocial stressors includes chronic pain, and loss of her mother in June 2021.  Will do further up titration of nortriptyline to target anxiety, depression.  Discussed potential risk of serotonin syndrome.  Will continue venlafaxine to target depression and anxiety.   This clinician has discussed the side effect associated with medication prescribed during this encounter. Please refer to notes in the previous encounters for more details.    Plan Continue venlafaxine 225 mg daily Increase Nortriptyline 50 mg at night  3. Next appointment: 2/21 at 3:30 for 30 mins, video - on Ambien 10 mg at night, valium 5 mg TID   The patient demonstrates the following risk factors for suicide: Chronic risk factors for suicide include: psychiatric disorder of depression. Acute risk factors for suicide  include: unemployment. Protective factors for this patient include: positive social support, coping skills and hope for the future. Considering these factors, the overall suicide risk at this point appears to be low. Patient is appropriate for outpatient follow up.  [name] Clay, MD 08/26/2021, 4:32 PM

## 2021-08-26 ENCOUNTER — Telehealth (INDEPENDENT_AMBULATORY_CARE_PROVIDER_SITE_OTHER): Payer: Medicare Other | Admitting: Psychiatry

## 2021-08-26 ENCOUNTER — Encounter: Payer: Self-pay | Admitting: Psychiatry

## 2021-08-26 ENCOUNTER — Other Ambulatory Visit: Payer: Self-pay | Admitting: Internal Medicine

## 2021-08-26 ENCOUNTER — Other Ambulatory Visit: Payer: Self-pay | Admitting: Psychiatry

## 2021-08-26 ENCOUNTER — Other Ambulatory Visit: Payer: Self-pay

## 2021-08-26 DIAGNOSIS — F331 Major depressive disorder, recurrent, moderate: Secondary | ICD-10-CM

## 2021-08-26 DIAGNOSIS — F411 Generalized anxiety disorder: Secondary | ICD-10-CM

## 2021-08-26 MED ORDER — NORTRIPTYLINE HCL 50 MG PO CAPS: 50.0000 mg | ORAL_CAPSULE | Freq: Every day | ORAL | 1 refills | Status: DC

## 2021-08-26 NOTE — Patient Instructions (Signed)
Continue venlafaxine 225 mg daily Increase Nortriptyline 50 mg at night  3. Next appointment: 2/21 at 3:30

## 2021-09-07 DIAGNOSIS — M47817 Spondylosis without myelopathy or radiculopathy, lumbosacral region: Secondary | ICD-10-CM | POA: Diagnosis not present

## 2021-09-07 DIAGNOSIS — G4700 Insomnia, unspecified: Secondary | ICD-10-CM | POA: Diagnosis not present

## 2021-09-07 DIAGNOSIS — M62830 Muscle spasm of back: Secondary | ICD-10-CM | POA: Diagnosis not present

## 2021-09-07 DIAGNOSIS — G894 Chronic pain syndrome: Secondary | ICD-10-CM | POA: Diagnosis not present

## 2021-09-10 ENCOUNTER — Other Ambulatory Visit: Payer: Self-pay | Admitting: Internal Medicine

## 2021-09-20 ENCOUNTER — Other Ambulatory Visit: Payer: Self-pay | Admitting: Psychiatry

## 2021-09-27 ENCOUNTER — Encounter: Payer: Medicare Other | Admitting: Internal Medicine

## 2021-09-27 ENCOUNTER — Ambulatory Visit: Payer: Medicare Other | Admitting: Internal Medicine

## 2021-10-01 NOTE — Progress Notes (Unsigned)
[facility] MD/PA/NP OP Progress Note  10/01/2021 12:18 PM [name]  MRN:  [id number]  Chief Complaint: No chief complaint on file.  HPI: *** Visit Diagnosis: No diagnosis found.  Past Psychiatric History: Please see initial evaluation for full details. I have reviewed the history. No updates at this time.     Past Medical History:  Past Medical History:  Diagnosis Date   Allergic rhinitis    Anxiety    Arthralgia    Asthma    Bronchitis, chronic (HCC)    Chronic back pain    Collagenous colitis    followed by Dr Tiffany Kocher   COVID-19    09/03/20 with pneumonia   Depression    Diplopia 11/15/2013   Dysphagia    Endometriosis    Fibrocystic breast disease    GERD (gastroesophageal reflux disease)    History of colon polyps    Hyperlipidemia    Hypertension    Hypokalemia    IBS (irritable bowel syndrome)    Memory difficulty 11/15/2013   Migraine headache    Pneumonia 2017   x2   PONV (postoperative nausea and vomiting)    used patch   Sleep apnea    recommended CPAP   Ulcer disease    Urine incontinence     Past Surgical History:  Procedure Laterality Date   ABDOMINAL HYSTERECTOMY     endometriosis   BACK SURGERY     x6   BREAST BIOPSY Right    COLONOSCOPY WITH PROPOFOL N/A 06/25/2015   Procedure: COLONOSCOPY WITH PROPOFOL;  Surgeon: Manya Silvas, MD;  Location: [facility];  Service: Endoscopy;  Laterality: N/A;   DILATION AND CURETTAGE OF UTERUS     and hysteroscopy   ESOPHAGOGASTRODUODENOSCOPY N/A 06/25/2015   Procedure: ESOPHAGOGASTRODUODENOSCOPY (EGD);  Surgeon: Manya Silvas, MD;  Location: [facility] ENDOSCOPY;  Service: Endoscopy;  Laterality: N/A;   insertion of permanent spinal cord stimulator     insertion of trial spinal cord stimulator     LUMBAR DISC SURGERY     LUMBAR FUSION  4/05 and 8/07   LUMBAR MICRODISCECTOMY  03/28/03   L4-5 L5   MOUTH SURGERY     NASAL SEPTOPLASTY W/ TURBINOPLASTY     removal of spinal cord stimulator     SEPTOPLASTY      with reduction of turbinates   THYROIDECTOMY N/A 06/06/2016   Procedure: THYROIDECTOMY;  Surgeon: Robert Bellow, MD;  Location: [facility];  Service: General;  Laterality: N/A;    Family Psychiatric History: Please see initial evaluation for full details. I have reviewed the history. No updates at this time.     Family History:  Family History  Problem Relation Age of Onset   Allergies Mother    Hypertension Mother    Arthritis Mother    Hyperlipidemia Mother    Stroke Mother    Hypertension Father    Arthritis Father    Diabetes Father    Hypertension Brother    Diabetes Brother    Arthritis Maternal Grandmother    Hyperlipidemia Maternal Grandmother    Hyperlipidemia Maternal Grandfather    Arthritis Paternal Grandmother    Arthritis Paternal Grandfather    Asthma Other        several family member   Leukemia Other        uncle   Lymphoma Maternal Aunt        non hodgkins    Social History:  Social History   Socioeconomic History   Marital status:  Married    Spouse name: douglas   Number of children: 1   Years of education: 23 th   Highest education level: High school graduate  Occupational History   Occupation: Disabled    Employer: DISABLED  Tobacco Use   Smoking status: Never   Smokeless tobacco: Never  Vaping Use   Vaping Use: Never used  Substance and Sexual Activity   Alcohol use: No    Alcohol/week: 0.0 standard drinks   Drug use: No   Sexual activity: Not Currently  Other Topics Concern   Not on file  Social History Narrative   ** Merged History Encounter **       Social Determinants of Health   Financial Resource Strain: Not on file  Food Insecurity: Not on file  Transportation Needs: Not on file  Physical Activity: Not on file  Stress: Not on file  Social Connections: Not on file    Allergies:  Allergies  Allergen Reactions   Fentanyl Other (See Comments)    Other reaction(s): Headache, Other (See  Comments) headache SEVERE HEADACHE headache   Sumatriptan Other (See Comments)    Other reaction(s): Headache Severe headache   Doxycycline Other (See Comments)    Other reaction(s): Headache, Other (See Comments) REACTION: causes severe abd pain REACTION: causes severe abd pain REACTION: causes severe abd pain   Montelukast Other (See Comments)    SEVERE HEADACHE   Oxymorphone Other (See Comments)    REACTION: severe headaches   Benzoin Rash   Buspirone Other (See Comments)    SEVERE HEADACHE   Ciprofloxacin Nausea Only   Conjugated Estrogens Other (See Comments)    headaches   Cymbalta [Duloxetine Hcl] Other (See Comments)    Headache   Duloxetine Other (See Comments)    Headache   Erythromycin Ethylsuccinate Other (See Comments)    Stomach ache   Estradiol Other (See Comments)    headache   Metronidazole Other (See Comments)    abd pain   Mirtazapine Other (See Comments)    headache    Metabolic Disorder Labs: Lab Results  Component Value Date   HGBA1C 5.4 05/28/2020   No results found for: PROLACTIN Lab Results  Component Value Date   CHOL 202 (H) 02/23/2021   TRIG 217.0 (H) 02/23/2021   HDL 37.50 (L) 02/23/2021   CHOLHDL 5 02/23/2021   VLDL 43.4 (H) 02/23/2021   LDLCALC 121 (H) 07/12/2016   LDLCALC 187 (H) 08/03/2015   Lab Results  Component Value Date   TSH 0.480 09/06/2020   TSH 0.590 09/04/2020    Therapeutic Level Labs: No results found for: LITHIUM No results found for: VALPROATE No components found for:  CBMZ  Current Medications: Current Outpatient Medications  Medication Sig Dispense Refill   albuterol (PROVENTIL) (2.5 MG/3ML) 0.083% nebulizer solution Take 3 mLs (2.5 mg total) by nebulization every 4 (four) hours as needed for wheezing or shortness of breath. 360 mL 2   albuterol (VENTOLIN HFA) 108 (90 Base) MCG/ACT inhaler Inhale 2 puffs into the lungs every 4 (four) hours as needed for wheezing or shortness of breath. 1 each 11    amLODipine (NORVASC) 5 MG tablet TAKE 1 TABLET BY MOUTH EVERY DAY 90 tablet 1   ascorbic acid (VITAMIN C) 500 MG tablet Take 1 tablet (500 mg total) by mouth daily. 30 tablet 0   bisacodyl (DULCOLAX) 5 MG EC tablet Take 5 mg by mouth daily as needed for moderate constipation.     diazepam (VALIUM) 5 MG  tablet Take 5 mg by mouth 3 (three) times daily as needed for anxiety.     docusate sodium (COLACE) 100 MG capsule Take 100 mg by mouth 2 (two) times daily.     esomeprazole (NEXIUM) 20 MG capsule Take 20 mg by mouth daily at 12 noon.     GRALISE 600 MG TABS Take 600 mg by mouth 3 (three) times daily.     hydrochlorothiazide (HYDRODIURIL) 25 MG tablet Take 25 mg by mouth daily.  11   KLOR-CON M10 10 MEQ tablet TAKE 1 TABLET BY MOUTH EVERY DAY 30 tablet 0   metoprolol tartrate (LOPRESSOR) 50 MG tablet TAKE 1/2 TABLET BY MOUTH TWICE A DAY (NEED OFFICE VISIT) 90 tablet 0   Multiple Vitamin (MULTIVITAMIN) capsule Take 1 capsule by mouth daily.     nortriptyline (PAMELOR) 50 MG capsule Take 1 capsule (50 mg total) by mouth at bedtime. 30 capsule 1   Omega-3 Fatty Acids (FISH OIL) 1200 MG CAPS Take 1 capsule by mouth daily.     ondansetron (ZOFRAN) 4 MG tablet Take 1 tablet (4 mg total) by mouth every 8 (eight) hours as needed. 40 tablet 0   Oxycodone HCl 10 MG TABS Take 10 mg by mouth 3 (three) times daily as needed (breakthrough pain).     pravastatin (PRAVACHOL) 20 MG tablet TAKE 1 TABLET BY MOUTH EVERY DAY 90 tablet 1   predniSONE (DELTASONE) 10 MG tablet Take 6 tablets x 1 day and then decrease by 1/2 tablet per day until down to zero mg. 39 tablet 0   sodium chloride (OCEAN) 0.65 % SOLN nasal spray Place 2 sprays into both nostrils daily as needed for congestion. 30 mL 2   TIROSINT 137 MCG CAPS TAKE 1 CAPSULE BY MOUTH EVERY DAY BEFORE BREAKFAST 30 capsule 2   TRELEGY ELLIPTA 100-62.5-25 MCG/ACT AEPB INHALE 1 PUFF BY MOUTH EVERY DAY 60 each 3   venlafaxine XR (EFFEXOR-XR) 75 MG 24 hr capsule Take 3  capsules (225 mg total) by mouth daily with breakfast. 270 capsule 0   XTAMPZA ER 36 MG C12A Take 36 mg by mouth every 12 (twelve) hours.  0   zinc sulfate 220 (50 Zn) MG capsule Take 1 capsule (220 mg total) by mouth daily. 30 capsule 0   zolpidem (AMBIEN) 10 MG tablet Take 10 mg by mouth at bedtime as needed for sleep.     No current facility-administered medications for this visit.     Musculoskeletal: Strength & Muscle Tone:  N/A Gait & Station:  N/A Patient leans: N/A  Psychiatric Specialty Exam: Review of Systems  There were no vitals taken for this visit.There is no height or weight on file to calculate BMI.  General Appearance: {Appearance:22683}  Eye Contact:  {B[facility] EYE CONTACT:22684}  Speech:  Clear and Coherent  Volume:  Normal  Mood:  {BHH MOOD:22306}  Affect:  {Affect (PAA):22687}  Thought Process:  Coherent  Orientation:  Full (Time, Place, and Person)  Thought Content: Logical   Suicidal Thoughts:  {ST/HT (PAA):22692}  Homicidal Thoughts:  {ST/HT (PAA):22692}  Memory:  Immediate;   Good  Judgement:  {Judgement (PAA):22694}  Insight:  {Insight (PAA):22695}  Psychomotor Activity:  Normal  Concentration:  Concentration: Good and Attention Span: Good  Recall:  Good  Fund of Knowledge: Good  Language: Good  Akathisia:  No  Handed:  Right  AIMS (if indicated): not done  Assets:  Communication Skills Desire for Improvement  ADL's:  Intact  Cognition: WNL  Sleep:  {  [place] GOOD/FAIR/POOR:22877}   Screenings: PHQ2-9    Flowsheet Row Video Visit from 08/26/2021 in [facility] Office Visit from 02/23/2021 in [facility] Video Visit from 11/05/2020 in [facility] Office Visit from 06/05/2020 in [facility] from 05/05/2020 in [facility]  PHQ-2 Total Score 2 4 0 5 1  PHQ-9 Total Score 14 12 0 20 --      Flowsheet Row Video Visit from 08/26/2021 in  [facility] Video Visit from 01/14/2021 in [facility] ED to Hosp-Admission (Discharged) from 09/03/2020 in [place] (1C)  C-SSRS RISK CATEGORY Error: Q3, 4, or 5 should not be populated when Q2 is No Low Risk No Risk        Assessment and Plan:  [name] is a 61 y.o. year old female with a history of depression, anxiety,  HTN, HL, dysphasia, asthma. migraine, sleep apnea, s/p covid pneumonia in 08/2020, who presents for follow up appointment for below.    1. MDD (major depressive disorder), recurrent episode, moderate ([place]) 2. Anxiety state There has been overall improvement in depressive symptoms and an anxiety since restarting the nortriptyline.  Psychosocial stressors includes chronic pain, and loss of her mother in June 2021.  Will do further up titration of nortriptyline to target anxiety, depression.  Discussed potential risk of serotonin syndrome.  Will continue venlafaxine to target depression and anxiety.    This clinician has discussed the side effect associated with medication prescribed during this encounter. Please refer to notes in the previous encounters for more details.     Plan Continue venlafaxine 225 mg daily Increase Nortriptyline 50 mg at night  3. Next appointment: 2/21 at 3:30 for 30 mins, video - on Ambien 10 mg at night, valium 5 mg TID   The patient demonstrates the following risk factors for suicide: Chronic risk factors for suicide include: psychiatric disorder of depression. Acute risk factors for suicide include: unemployment. Protective factors for this patient include: positive social support, coping skills and hope for the future. Considering these factors, the overall suicide risk at this point appears to be low. Patient is appropriate for outpatient follow up.   Collaboration of Care: Collaboration of Care: {BH OP Collaboration of  Care:21014065}  Patient/Guardian was advised Release of Information must be obtained prior to any record release in order to collaborate their care with an outside provider. Patient/Guardian was advised if they have not already done so to contact the registration department to sign all necessary forms in order for [place] to release information regarding their care.   Consent: Patient/Guardian gives verbal consent for treatment and assignment of benefits for services provided during this visit. Patient/Guardian expressed understanding and agreed to proceed.    Norman Clay, MD 10/01/2021, 12:18 PM

## 2021-10-05 ENCOUNTER — Other Ambulatory Visit: Payer: Self-pay

## 2021-10-05 ENCOUNTER — Telehealth: Payer: Medicare Other | Admitting: Psychiatry

## 2021-10-06 DIAGNOSIS — G4700 Insomnia, unspecified: Secondary | ICD-10-CM | POA: Diagnosis not present

## 2021-10-06 DIAGNOSIS — M47817 Spondylosis without myelopathy or radiculopathy, lumbosacral region: Secondary | ICD-10-CM | POA: Diagnosis not present

## 2021-10-06 DIAGNOSIS — G894 Chronic pain syndrome: Secondary | ICD-10-CM | POA: Diagnosis not present

## 2021-10-06 DIAGNOSIS — M62830 Muscle spasm of back: Secondary | ICD-10-CM | POA: Diagnosis not present

## 2021-10-09 ENCOUNTER — Other Ambulatory Visit: Payer: Self-pay | Admitting: Internal Medicine

## 2021-10-15 ENCOUNTER — Other Ambulatory Visit: Payer: Self-pay | Admitting: Internal Medicine

## 2021-10-19 ENCOUNTER — Other Ambulatory Visit: Payer: Self-pay | Admitting: Psychiatry

## 2021-10-19 NOTE — Telephone Encounter (Signed)
I refilled her metoprolol for 30 days.  Needs office visit.   ?

## 2021-10-23 ENCOUNTER — Other Ambulatory Visit: Payer: Self-pay | Admitting: Psychiatry

## 2021-10-23 NOTE — Telephone Encounter (Signed)
Ordered refill of nortriptyline per request. Please contact the patient to make follow up appointment. I will not be able to prescribe any more refills without evaluation. ?

## 2021-11-02 ENCOUNTER — Other Ambulatory Visit: Payer: Self-pay | Admitting: Internal Medicine

## 2021-11-04 ENCOUNTER — Telehealth: Payer: Self-pay

## 2021-11-04 NOTE — Telephone Encounter (Signed)
letter was mailed out to have pt contact our office to set up an appt. ?

## 2021-11-04 NOTE — Telephone Encounter (Signed)
left message to call office back to set up an appt to be seen last seen o 08-26-21 and appt for 10-05-21  ?

## 2021-11-08 ENCOUNTER — Other Ambulatory Visit: Payer: Self-pay

## 2021-11-08 ENCOUNTER — Ambulatory Visit
Admission: RE | Admit: 2021-11-08 | Discharge: 2021-11-08 | Disposition: A | Discharge status: Home / Self Care | Payer: Medicare Other | Source: Ambulatory Visit | Attending: Physical Medicine and Rehabilitation | Admitting: Physical Medicine and Rehabilitation

## 2021-11-08 ENCOUNTER — Ambulatory Visit
Admission: RE | Admit: 2021-11-08 | Discharge: 2021-11-08 | Disposition: A | Discharge status: Home / Self Care | Payer: Medicare Other | Source: Ambulatory Visit | Attending: Internal Medicine | Admitting: Internal Medicine

## 2021-11-08 ENCOUNTER — Other Ambulatory Visit: Payer: Self-pay | Admitting: Physical Medicine and Rehabilitation

## 2021-11-08 DIAGNOSIS — U071 COVID-19: Secondary | ICD-10-CM

## 2021-11-08 DIAGNOSIS — Z79891 Long term (current) use of opiate analgesic: Secondary | ICD-10-CM | POA: Diagnosis not present

## 2021-11-08 DIAGNOSIS — M62830 Muscle spasm of back: Secondary | ICD-10-CM | POA: Diagnosis not present

## 2021-11-08 DIAGNOSIS — G4700 Insomnia, unspecified: Secondary | ICD-10-CM | POA: Diagnosis not present

## 2021-11-08 DIAGNOSIS — R52 Pain, unspecified: Secondary | ICD-10-CM

## 2021-11-08 DIAGNOSIS — R918 Other nonspecific abnormal finding of lung field: Secondary | ICD-10-CM | POA: Diagnosis not present

## 2021-11-08 DIAGNOSIS — M25512 Pain in left shoulder: Secondary | ICD-10-CM | POA: Diagnosis not present

## 2021-11-08 DIAGNOSIS — G894 Chronic pain syndrome: Secondary | ICD-10-CM | POA: Diagnosis not present

## 2021-11-08 DIAGNOSIS — M47817 Spondylosis without myelopathy or radiculopathy, lumbosacral region: Secondary | ICD-10-CM | POA: Diagnosis not present

## 2021-11-09 DIAGNOSIS — Z79891 Long term (current) use of opiate analgesic: Secondary | ICD-10-CM | POA: Diagnosis not present

## 2021-11-09 DIAGNOSIS — G894 Chronic pain syndrome: Secondary | ICD-10-CM | POA: Diagnosis not present

## 2021-11-11 ENCOUNTER — Other Ambulatory Visit: Payer: Self-pay | Admitting: Internal Medicine

## 2021-11-11 ENCOUNTER — Other Ambulatory Visit: Payer: Self-pay | Admitting: Psychiatry

## 2021-11-11 DIAGNOSIS — F330 Major depressive disorder, recurrent, mild: Secondary | ICD-10-CM

## 2021-11-11 DIAGNOSIS — F411 Generalized anxiety disorder: Secondary | ICD-10-CM

## 2021-11-12 ENCOUNTER — Encounter: Payer: Self-pay | Admitting: *Deleted

## 2021-12-02 ENCOUNTER — Telehealth: Payer: Self-pay | Admitting: Internal Medicine

## 2021-12-02 NOTE — Telephone Encounter (Signed)
Copied from [place] [phone]. Topic: Medicare AWV ?>> Dec 02, 2021 10:48 AM Harris-Coley, Hannah Beat wrote: ?Reason for CRM: Left message for patient to schedule Annual Wellness Visit.  Please schedule with Nurse Health Advisor Charlott Rakes, RN at [facility].  Please call [phone] ask for Juliann Pulse ?

## 2021-12-09 ENCOUNTER — Other Ambulatory Visit: Payer: Self-pay | Admitting: Internal Medicine

## 2021-12-09 ENCOUNTER — Other Ambulatory Visit: Payer: Self-pay | Admitting: Psychiatry

## 2021-12-13 DIAGNOSIS — M47817 Spondylosis without myelopathy or radiculopathy, lumbosacral region: Secondary | ICD-10-CM | POA: Diagnosis not present

## 2021-12-13 DIAGNOSIS — G894 Chronic pain syndrome: Secondary | ICD-10-CM | POA: Diagnosis not present

## 2021-12-13 DIAGNOSIS — G4700 Insomnia, unspecified: Secondary | ICD-10-CM | POA: Diagnosis not present

## 2021-12-13 DIAGNOSIS — M62830 Muscle spasm of back: Secondary | ICD-10-CM | POA: Diagnosis not present

## 2021-12-17 DIAGNOSIS — Z20822 Contact with and (suspected) exposure to covid-19: Secondary | ICD-10-CM | POA: Diagnosis not present

## 2021-12-20 NOTE — Progress Notes (Deleted)
[facility] MD/PA/NP OP Progress Note  12/20/2021 12:57 PM [name]  MRN:  [id number]  Chief Complaint: No chief complaint on file.  HPI: *** Visit Diagnosis: No diagnosis found.  Past Psychiatric History: Please see initial evaluation for full details. I have reviewed the history. No updates at this time.     Past Medical History:  Past Medical History:  Diagnosis Date   Allergic rhinitis    Anxiety    Arthralgia    Asthma    Bronchitis, chronic (HCC)    Chronic back pain    Collagenous colitis    followed by Dr Tiffany Kocher   COVID-19    09/03/20 with pneumonia   Depression    Diplopia 11/15/2013   Dysphagia    Endometriosis    Fibrocystic breast disease    GERD (gastroesophageal reflux disease)    History of colon polyps    Hyperlipidemia    Hypertension    Hypokalemia    IBS (irritable bowel syndrome)    Memory difficulty 11/15/2013   Migraine headache    Pneumonia 2017   x2   PONV (postoperative nausea and vomiting)    used patch   Sleep apnea    recommended CPAP   Ulcer disease    Urine incontinence     Past Surgical History:  Procedure Laterality Date   ABDOMINAL HYSTERECTOMY     endometriosis   BACK SURGERY     x6   BREAST BIOPSY Right    COLONOSCOPY WITH PROPOFOL N/A 06/25/2015   Procedure: COLONOSCOPY WITH PROPOFOL;  Surgeon: Manya Silvas, MD;  Location: [facility];  Service: Endoscopy;  Laterality: N/A;   DILATION AND CURETTAGE OF UTERUS     and hysteroscopy   ESOPHAGOGASTRODUODENOSCOPY N/A 06/25/2015   Procedure: ESOPHAGOGASTRODUODENOSCOPY (EGD);  Surgeon: Manya Silvas, MD;  Location: [facility] ENDOSCOPY;  Service: Endoscopy;  Laterality: N/A;   insertion of permanent spinal cord stimulator     insertion of trial spinal cord stimulator     LUMBAR DISC SURGERY     LUMBAR FUSION  4/05 and 8/07   LUMBAR MICRODISCECTOMY  03/28/03   L4-5 L5   MOUTH SURGERY     NASAL SEPTOPLASTY W/ TURBINOPLASTY     removal of spinal cord stimulator     SEPTOPLASTY      with reduction of turbinates   THYROIDECTOMY N/A 06/06/2016   Procedure: THYROIDECTOMY;  Surgeon: Robert Bellow, MD;  Location: [facility];  Service: General;  Laterality: N/A;    Family Psychiatric History: Please see initial evaluation for full details. I have reviewed the history. No updates at this time.     Family History:  Family History  Problem Relation Age of Onset   Allergies Mother    Hypertension Mother    Arthritis Mother    Hyperlipidemia Mother    Stroke Mother    Hypertension Father    Arthritis Father    Diabetes Father    Hypertension Brother    Diabetes Brother    Arthritis Maternal Grandmother    Hyperlipidemia Maternal Grandmother    Hyperlipidemia Maternal Grandfather    Arthritis Paternal Grandmother    Arthritis Paternal Grandfather    Asthma Other        several family member   Leukemia Other        uncle   Lymphoma Maternal Aunt        non hodgkins    Social History:  Social History   Socioeconomic History   Marital status:  Married    Spouse name: douglas   Number of children: 1   Years of education: 26 th   Highest education level: High school graduate  Occupational History   Occupation: Disabled    Employer: DISABLED  Tobacco Use   Smoking status: Never   Smokeless tobacco: Never  Vaping Use   Vaping Use: Never used  Substance and Sexual Activity   Alcohol use: No    Alcohol/week: 0.0 standard drinks   Drug use: No   Sexual activity: Not Currently  Other Topics Concern   Not on file  Social History Narrative   ** Merged History Encounter **       Social Determinants of Health   Financial Resource Strain: Not on file  Food Insecurity: Not on file  Transportation Needs: Not on file  Physical Activity: Not on file  Stress: Not on file  Social Connections: Not on file    Allergies:  Allergies  Allergen Reactions   Fentanyl Other (See Comments)    Other reaction(s): Headache, Other (See Comments) headache SEVERE  HEADACHE headache   Sumatriptan Other (See Comments)    Other reaction(s): Headache Severe headache   Doxycycline Other (See Comments)    Other reaction(s): Headache, Other (See Comments) REACTION: causes severe abd pain REACTION: causes severe abd pain REACTION: causes severe abd pain   Montelukast Other (See Comments)    SEVERE HEADACHE   Oxymorphone Other (See Comments)    REACTION: severe headaches   Benzoin Rash   Buspirone Other (See Comments)    SEVERE HEADACHE   Ciprofloxacin Nausea Only   Conjugated Estrogens Other (See Comments)    headaches   Cymbalta [Duloxetine Hcl] Other (See Comments)    Headache   Duloxetine Other (See Comments)    Headache   Erythromycin Ethylsuccinate Other (See Comments)    Stomach ache   Estradiol Other (See Comments)    headache   Metronidazole Other (See Comments)    abd pain   Mirtazapine Other (See Comments)    headache    Metabolic Disorder Labs: Lab Results  Component Value Date   HGBA1C 5.4 05/28/2020   No results found for: PROLACTIN Lab Results  Component Value Date   CHOL 202 (H) 02/23/2021   TRIG 217.0 (H) 02/23/2021   HDL 37.50 (L) 02/23/2021   CHOLHDL 5 02/23/2021   VLDL 43.4 (H) 02/23/2021   LDLCALC 121 (H) 07/12/2016   LDLCALC 187 (H) 08/03/2015   Lab Results  Component Value Date   TSH 0.480 09/06/2020   TSH 0.590 09/04/2020    Therapeutic Level Labs: No results found for: LITHIUM No results found for: VALPROATE No components found for:  CBMZ  Current Medications: Current Outpatient Medications  Medication Sig Dispense Refill   albuterol (PROVENTIL) (2.5 MG/3ML) 0.083% nebulizer solution Take 3 mLs (2.5 mg total) by nebulization every 4 (four) hours as needed for wheezing or shortness of breath. 360 mL 2   albuterol (VENTOLIN HFA) 108 (90 Base) MCG/ACT inhaler Inhale 2 puffs into the lungs every 4 (four) hours as needed for wheezing or shortness of breath. 1 each 11   amLODipine (NORVASC) 5 MG  tablet TAKE 1 TABLET BY MOUTH EVERY DAY 90 tablet 1   ascorbic acid (VITAMIN C) 500 MG tablet Take 1 tablet (500 mg total) by mouth daily. 30 tablet 0   bisacodyl (DULCOLAX) 5 MG EC tablet Take 5 mg by mouth daily as needed for moderate constipation.     diazepam (VALIUM) 5 MG  tablet Take 5 mg by mouth 3 (three) times daily as needed for anxiety.     docusate sodium (COLACE) 100 MG capsule Take 100 mg by mouth 2 (two) times daily.     esomeprazole (NEXIUM) 20 MG capsule Take 20 mg by mouth daily at 12 noon.     GRALISE 600 MG TABS Take 600 mg by mouth 3 (three) times daily.     hydrochlorothiazide (HYDRODIURIL) 25 MG tablet Take 25 mg by mouth daily.  11   KLOR-CON M10 10 MEQ tablet TAKE 1 TABLET BY MOUTH EVERY DAY 30 tablet 0   metoprolol tartrate (LOPRESSOR) 50 MG tablet TAKE 1/2 TABLET BY MOUTH TWICE A DAY (NEED OFFICE VISIT) 30 tablet 0   Multiple Vitamin (MULTIVITAMIN) capsule Take 1 capsule by mouth daily.     nortriptyline (PAMELOR) 50 MG capsule TAKE 1 CAPSULE BY MOUTH AT BEDTIME. 30 capsule 0   Omega-3 Fatty Acids (FISH OIL) 1200 MG CAPS Take 1 capsule by mouth daily.     ondansetron (ZOFRAN) 4 MG tablet Take 1 tablet (4 mg total) by mouth every 8 (eight) hours as needed. 40 tablet 0   Oxycodone HCl 10 MG TABS Take 10 mg by mouth 3 (three) times daily as needed (breakthrough pain).     pravastatin (PRAVACHOL) 20 MG tablet TAKE 1 TABLET BY MOUTH EVERY DAY 90 tablet 1   predniSONE (DELTASONE) 10 MG tablet Take 6 tablets x 1 day and then decrease by 1/2 tablet per day until down to zero mg. 39 tablet 0   sodium chloride (OCEAN) 0.65 % SOLN nasal spray Place 2 sprays into both nostrils daily as needed for congestion. 30 mL 2   TIROSINT 137 MCG CAPS TAKE 1 CAPSULE BY MOUTH EVERY DAY BEFORE BREAKFAST 30 capsule 2   TRELEGY ELLIPTA 100-62.5-25 MCG/ACT AEPB INHALE 1 PUFF BY MOUTH EVERY DAY 60 each 3   venlafaxine XR (EFFEXOR-XR) 75 MG 24 hr capsule Take 3 capsules (225 mg total) by mouth daily  with breakfast. 270 capsule 0   XTAMPZA ER 36 MG C12A Take 36 mg by mouth every 12 (twelve) hours.  0   zinc sulfate 220 (50 Zn) MG capsule Take 1 capsule (220 mg total) by mouth daily. 30 capsule 0   zolpidem (AMBIEN) 10 MG tablet Take 10 mg by mouth at bedtime as needed for sleep.     No current facility-administered medications for this visit.     Musculoskeletal: Strength & Muscle Tone:  N/A Gait & Station:  N/A Patient leans: N/A  Psychiatric Specialty Exam: Review of Systems  There were no vitals taken for this visit.There is no height or weight on file to calculate BMI.  General Appearance: {Appearance:22683}  Eye Contact:  {BHH EYE CONTACT:22684}  Speech:  Clear and Coherent  Volume:  Normal  Mood:  {BHH MOOD:22306}  Affect:  {Affect (PAA):22687}  Thought Process:  Coherent  Orientation:  Full (Time, Place, and Person)  Thought Content: Logical   Suicidal Thoughts:  {ST/HT (PAA):22692}  Homicidal Thoughts:  {ST/HT (PAA):22692}  Memory:  Immediate;   Good  Judgement:  {Judgement (PAA):22694}  Insight:  {Insight (PAA):22695}  Psychomotor Activity:  Normal  Concentration:  Concentration: Good and Attention Span: Good  Recall:  Good  Fund of Knowledge: Good  Language: Good  Akathisia:  No  Handed:  Right  AIMS (if indicated): not done  Assets:  Communication Skills Desire for Improvement  ADL's:  Intact  Cognition: WNL  Sleep:  {BHH GOOD/FAIR/POOR:22877}  Screenings: PHQ2-9    Flowsheet Row Video Visit from 08/26/2021 in [facility] Office Visit from 02/23/2021 in [facility] Video Visit from 11/05/2020 in [facility] Office Visit from 06/05/2020 in [facility] from 05/05/2020 in [facility]  PHQ-2 Total Score 2 4 0 5 1  PHQ-9 Total Score 14 12 0 20 --      Flowsheet Row Video Visit from 08/26/2021 in [facility] Video Visit from 01/14/2021 in [facility] to Hosp-Admission (Discharged) from 09/03/2020 in [facility] CATEGORY Error: Q3, 4, or 5 should not be populated when Q2 is No Low Risk No Risk        Assessment and Plan:  [name] is a 61 y.o. year old female with a history of  depression, anxiety,  HTN, HL, dysphasia, asthma. migraine, sleep apnea, s/p covid pneumonia in 08/2020, who presents for follow up appointment for below.     1. MDD (major depressive disorder), recurrent episode, moderate ([place]) 2. Anxiety state There has been overall improvement in depressive symptoms and an anxiety since restarting the nortriptyline.  Psychosocial stressors includes chronic pain, and loss of her mother in June 2021.  Will do further up titration of nortriptyline to target anxiety, depression.  Discussed potential risk of serotonin syndrome.  Will continue venlafaxine to target depression and anxiety.    This clinician has discussed the side effect associated with medication prescribed during this encounter. Please refer to notes in the previous encounters for more details.     Plan Continue venlafaxine 225 mg daily Increase Nortriptyline 50 mg at night  3. Next appointment: 2/21 at 3:30 for 30 mins, video - on Ambien 10 mg at night, valium 5 mg TID   The patient demonstrates the following risk factors for suicide: Chronic risk factors for suicide include: psychiatric disorder of depression. Acute risk factors for suicide include: unemployment. Protective factors for this patient include: positive social support, coping skills and hope for the future. Considering these factors, the overall suicide risk at this point appears to be low. Patient is appropriate for outpatient follow up.  Collaboration of Care: Collaboration of Care: {BH OP Collaboration of Care:21014065}  Patient/Guardian was advised Release  of Information must be obtained prior to any record release in order to collaborate their care with an outside provider. Patient/Guardian was advised if they have not already done so to contact the registration department to sign all necessary forms in order for [place] to release information regarding their care.   Consent: Patient/Guardian gives verbal consent for treatment and assignment of benefits for services provided during this visit. Patient/Guardian expressed understanding and agreed to proceed.    Norman Clay, MD 12/20/2021, 12:57 PM

## 2021-12-22 ENCOUNTER — Telehealth: Payer: Medicare Other | Admitting: Psychiatry

## 2021-12-22 ENCOUNTER — Telehealth: Payer: Self-pay | Admitting: Psychiatry

## 2021-12-22 NOTE — Telephone Encounter (Signed)
Sent link for video visit through Epic. Patient did not sign in. Called the patient for appointment scheduled today. The patient did not answer the phone. Left voice message to contact the office ([phone]).   ?

## 2021-12-27 NOTE — Telephone Encounter (Signed)
Pt has yet to schedule an appt. I'm forwarding this so I can close it out on my end. ?

## 2021-12-28 ENCOUNTER — Telehealth: Payer: Self-pay | Admitting: Internal Medicine

## 2021-12-28 NOTE — Telephone Encounter (Signed)
Staff message sent to scheduling to schedule a f/u appt ?

## 2021-12-28 NOTE — Telephone Encounter (Signed)
Lm to call office and schedule a follow up appointment/per Dr Nicki Reaper. Pt has not been seen in awhile. ?

## 2022-01-04 ENCOUNTER — Other Ambulatory Visit: Payer: Self-pay | Admitting: Internal Medicine

## 2022-01-04 ENCOUNTER — Telehealth: Payer: Self-pay

## 2022-01-04 NOTE — Telephone Encounter (Signed)
LMTCB to schedule an appointment. Will refill medication Metoprolol for 30 days.

## 2022-01-06 NOTE — Progress Notes (Signed)
Virtual Visit via Telephone Note  I connected with [name] on 01/13/22 at  2:00 PM EDT by telephone and verified that I am speaking with the correct person using two identifiers.  Location: Patient: home Provider: office Persons participated in the visit- patient, provider    I discussed the limitations, risks, security and privacy concerns of performing an evaluation and management service by telephone and the availability of in person appointments. I also discussed with the patient that there may be a patient responsible charge related to this service. The patient expressed understanding and agreed to proceed.    I discussed the assessment and treatment plan with the patient. The patient was provided an opportunity to ask questions and all were answered. The patient agreed with the plan and demonstrated an understanding of the instructions.   The patient was advised to call back or seek an in-person evaluation if the symptoms worsen or if the condition fails to improve as anticipated.  I provided 12 minutes of non-face-to-face time during this encounter.   Norman Clay, MD    [facility] MD/PA/NP OP Progress Note  01/13/2022 2:26 PM [name]  MRN:  [id number]  Chief Complaint:  Chief Complaint  Patient presents with   Follow-up   Depression   HPI:  - she is not seen since January This is a follow-up appointment for depression and anxiety.  She states that she has some issues with her leg.  She had surgery for thyroid removal in the past.  She has decrease in appetite.  She is planning to see her PCP for this condition.  She also struggles with back pain.  She does not get to do anything due to this pain.  She believes her mood is better since being on the higher dose of nortriptyline.  She enjoys playing games.  She did not have a good day on Mother's Day, missing her mother, who died a few years ago.  However, she reports good relationship with her daughter.  She has  occasional insomnia.  She feels down at times.  Although she does not mind if she were to be gone, she denies any SI plan or intent.  She notices dry mouth since being on the higher dose of nortriptyline.  However, she prefers to stay on the current medication regimen as it has worked very well for her mood.   Employment: unemployed, disability due to back pain s/p six back surgery Support: husband Household:  Husband, grandchild (age 63. Moved in since age 43 to help patient and her husband for transportation) Marital status: married Number of children: 1 daughter ("really good' relationship) She has "wonderful" parents. Her mother deceased 02-19-2020.   Visit Diagnosis: No diagnosis found.  Past Psychiatric History: Please see initial evaluation for full details. I have reviewed the history. No updates at this time.     Past Medical History:  Past Medical History:  Diagnosis Date   Allergic rhinitis    Anxiety    Arthralgia    Asthma    Bronchitis, chronic (HCC)    Chronic back pain    Collagenous colitis    followed by Dr Tiffany Kocher   COVID-19    09/03/20 with pneumonia   Depression    Diplopia 11/15/2013   Dysphagia    Endometriosis    Fibrocystic breast disease    GERD (gastroesophageal reflux disease)    History of colon polyps    Hyperlipidemia    Hypertension    Hypokalemia  IBS (irritable bowel syndrome)    Memory difficulty 11/15/2013   Migraine headache    Pneumonia 2017   x2   PONV (postoperative nausea and vomiting)    used patch   Sleep apnea    recommended CPAP   Ulcer disease    Urine incontinence     Past Surgical History:  Procedure Laterality Date   ABDOMINAL HYSTERECTOMY     endometriosis   BACK SURGERY     x6   BREAST BIOPSY Right    COLONOSCOPY WITH PROPOFOL N/A 06/25/2015   Procedure: COLONOSCOPY WITH PROPOFOL;  Surgeon: Manya Silvas, MD;  Location: [facility] ENDOSCOPY;  Service: Endoscopy;  Laterality: N/A;   DILATION AND CURETTAGE OF UTERUS      and hysteroscopy   ESOPHAGOGASTRODUODENOSCOPY N/A 06/25/2015   Procedure: ESOPHAGOGASTRODUODENOSCOPY (EGD);  Surgeon: Manya Silvas, MD;  Location: [facility] ENDOSCOPY;  Service: Endoscopy;  Laterality: N/A;   insertion of permanent spinal cord stimulator     insertion of trial spinal cord stimulator     LUMBAR DISC SURGERY     LUMBAR FUSION  4/05 and 8/07   LUMBAR MICRODISCECTOMY  03/28/03   L4-5 L5   MOUTH SURGERY     NASAL SEPTOPLASTY W/ TURBINOPLASTY     removal of spinal cord stimulator     SEPTOPLASTY     with reduction of turbinates   THYROIDECTOMY N/A 06/06/2016   Procedure: THYROIDECTOMY;  Surgeon: Robert Bellow, MD;  Location: [facility] ORS;  Service: General;  Laterality: N/A;    Family Psychiatric History: Please see initial evaluation for full details. I have reviewed the history. No updates at this time.     Family History:  Family History  Problem Relation Age of Onset   Allergies Mother    Hypertension Mother    Arthritis Mother    Hyperlipidemia Mother    Stroke Mother    Hypertension Father    Arthritis Father    Diabetes Father    Hypertension Brother    Diabetes Brother    Arthritis Maternal Grandmother    Hyperlipidemia Maternal Grandmother    Hyperlipidemia Maternal Grandfather    Arthritis Paternal Grandmother    Arthritis Paternal Grandfather    Asthma Other        several family member   Leukemia Other        uncle   Lymphoma Maternal Aunt        non hodgkins    Social History:  Social History   Socioeconomic History   Marital status: Married    Spouse name: douglas   Number of children: 1   Years of education: 50 th   Highest education level: High school graduate  Occupational History   Occupation: Disabled    Employer: DISABLED  Tobacco Use   Smoking status: Never   Smokeless tobacco: Never  Vaping Use   Vaping Use: Never used  Substance and Sexual Activity   Alcohol use: No    Alcohol/week: 0.0 standard drinks   Drug use:  No   Sexual activity: Not Currently  Other Topics Concern   Not on file  Social History Narrative   ** Merged History Encounter **       Social Determinants of Health   Financial Resource Strain: Not on file  Food Insecurity: Not on file  Transportation Needs: Not on file  Physical Activity: Not on file  Stress: Not on file  Social Connections: Not on file    Allergies:  Allergies  Allergen  Reactions   Fentanyl Other (See Comments)    Other reaction(s): Headache, Other (See Comments) headache SEVERE HEADACHE headache   Sumatriptan Other (See Comments)    Other reaction(s): Headache Severe headache   Doxycycline Other (See Comments)    Other reaction(s): Headache, Other (See Comments) REACTION: causes severe abd pain REACTION: causes severe abd pain REACTION: causes severe abd pain   Montelukast Other (See Comments)    SEVERE HEADACHE   Oxymorphone Other (See Comments)    REACTION: severe headaches   Benzoin Rash   Buspirone Other (See Comments)    SEVERE HEADACHE   Ciprofloxacin Nausea Only   Conjugated Estrogens Other (See Comments)    headaches   Cymbalta [Duloxetine Hcl] Other (See Comments)    Headache   Duloxetine Other (See Comments)    Headache   Erythromycin Ethylsuccinate Other (See Comments)    Stomach ache   Estradiol Other (See Comments)    headache   Metronidazole Other (See Comments)    abd pain   Mirtazapine Other (See Comments)    headache    Metabolic Disorder Labs: Lab Results  Component Value Date   HGBA1C 5.4 05/28/2020   No results found for: PROLACTIN Lab Results  Component Value Date   CHOL 202 (H) 02/23/2021   TRIG 217.0 (H) 02/23/2021   HDL 37.50 (L) 02/23/2021   CHOLHDL 5 02/23/2021   VLDL 43.4 (H) 02/23/2021   LDLCALC 121 (H) 07/12/2016   LDLCALC 187 (H) 08/03/2015   Lab Results  Component Value Date   TSH 0.480 09/06/2020   TSH 0.590 09/04/2020    Therapeutic Level Labs: No results found for: LITHIUM No  results found for: VALPROATE No components found for:  CBMZ  Current Medications: Current Outpatient Medications  Medication Sig Dispense Refill   albuterol (PROVENTIL) (2.5 MG/3ML) 0.083% nebulizer solution Take 3 mLs (2.5 mg total) by nebulization every 4 (four) hours as needed for wheezing or shortness of breath. 360 mL 2   albuterol (VENTOLIN HFA) 108 (90 Base) MCG/ACT inhaler Inhale 2 puffs into the lungs every 4 (four) hours as needed for wheezing or shortness of breath. 1 each 11   amLODipine (NORVASC) 5 MG tablet TAKE 1 TABLET BY MOUTH EVERY DAY 90 tablet 1   ascorbic acid (VITAMIN C) 500 MG tablet Take 1 tablet (500 mg total) by mouth daily. 30 tablet 0   bisacodyl (DULCOLAX) 5 MG EC tablet Take 5 mg by mouth daily as needed for moderate constipation.     diazepam (VALIUM) 5 MG tablet Take 5 mg by mouth 3 (three) times daily as needed for anxiety.     docusate sodium (COLACE) 100 MG capsule Take 100 mg by mouth 2 (two) times daily.     esomeprazole (NEXIUM) 20 MG capsule Take 20 mg by mouth daily at 12 noon.     GRALISE 600 MG TABS Take 600 mg by mouth 3 (three) times daily.     hydrochlorothiazide (HYDRODIURIL) 25 MG tablet Take 25 mg by mouth daily.  11   KLOR-CON M10 10 MEQ tablet TAKE 1 TABLET BY MOUTH EVERY DAY 30 tablet 0   metoprolol tartrate (LOPRESSOR) 50 MG tablet TAKE 1/2 TABLET BY MOUTH TWICE A DAY (NEED OFFICE VISIT) 30 tablet 0   Multiple Vitamin (MULTIVITAMIN) capsule Take 1 capsule by mouth daily.     nortriptyline (PAMELOR) 50 MG capsule TAKE 1 CAPSULE BY MOUTH AT BEDTIME. 30 capsule 0   Omega-3 Fatty Acids (FISH OIL) 1200 MG CAPS Take  1 capsule by mouth daily.     ondansetron (ZOFRAN) 4 MG tablet Take 1 tablet (4 mg total) by mouth every 8 (eight) hours as needed. 40 tablet 0   Oxycodone HCl 10 MG TABS Take 10 mg by mouth 3 (three) times daily as needed (breakthrough pain).     pravastatin (PRAVACHOL) 20 MG tablet TAKE 1 TABLET BY MOUTH EVERY DAY 90 tablet 1    predniSONE (DELTASONE) 10 MG tablet Take 6 tablets x 1 day and then decrease by 1/2 tablet per day until down to zero mg. 39 tablet 0   sodium chloride (OCEAN) 0.65 % SOLN nasal spray Place 2 sprays into both nostrils daily as needed for congestion. 30 mL 2   TIROSINT 137 MCG CAPS TAKE 1 CAPSULE BY MOUTH EVERY DAY BEFORE BREAKFAST 30 capsule 2   TRELEGY ELLIPTA 100-62.5-25 MCG/ACT AEPB INHALE 1 PUFF BY MOUTH EVERY DAY 60 each 3   venlafaxine XR (EFFEXOR-XR) 75 MG 24 hr capsule Take 3 capsules (225 mg total) by mouth daily with breakfast. 270 capsule 0   XTAMPZA ER 36 MG C12A Take 36 mg by mouth every 12 (twelve) hours.  0   zinc sulfate 220 (50 Zn) MG capsule Take 1 capsule (220 mg total) by mouth daily. 30 capsule 0   zolpidem (AMBIEN) 10 MG tablet Take 10 mg by mouth at bedtime as needed for sleep.     No current facility-administered medications for this visit.     Musculoskeletal: Strength & Muscle Tone:  N/A Gait & Station:  N/A Patient leans: N/A  Psychiatric Specialty Exam: Review of Systems  Psychiatric/Behavioral:  Positive for dysphoric mood and sleep disturbance. Negative for agitation, behavioral problems, confusion, decreased concentration, hallucinations, self-injury and suicidal ideas. The patient is not nervous/anxious and is not hyperactive.   All other systems reviewed and are negative.  There were no vitals taken for this visit.There is no height or weight on file to calculate BMI.  General Appearance: NA  Eye Contact:  NA  Speech:  Clear and Coherent  Volume:  Normal  Mood:   better  Affect:  NA  Thought Process:  Coherent  Orientation:  Full (Time, Place, and Person)  Thought Content: Logical   Suicidal Thoughts:  No  Homicidal Thoughts:  No  Memory:  Immediate;   Good  Judgement:  Good  Insight:  Good  Psychomotor Activity:  Normal  Concentration:  Concentration: Good and Attention Span: Good  Recall:  Good  Fund of Knowledge: Good  Language: Good   Akathisia:  No  Handed:  Right  AIMS (if indicated): not done  Assets:  Communication Skills Desire for Improvement  ADL's:  Intact  Cognition: WNL  Sleep:  Fair   Screenings: PHQ2-9    Flowsheet Row Video Visit from 08/26/2021 in [place] Office Visit from 02/23/2021 in [place] Video Visit from 11/05/2020 in [place] Office Visit from 06/05/2020 in [place] from 05/05/2020 in [place]  PHQ-2 Total Score 2 4 0 5 1  PHQ-9 Total Score 14 12 0 20 --      Flowsheet Row Video Visit from 08/26/2021 in [place] Video Visit from 01/14/2021 in [place] ED to Hosp-Admission (Discharged) from 09/03/2020 in [place] Error: Q3, 4, or 5 should not be populated when Q2 is No Low Risk No Risk  Assessment and Plan:  QUIANA COBAUGH is a 61 y.o. year old female with a history of depression, anxiety,  HTN, HL, dysphasia, asthma. migraine, sleep apnea, s/p covid pneumonia in 08/2020, who presents for follow up appointment for below.   1. MDD (major depressive disorder), recurrent episode, mild ([place]) 2. Anxiety state There has been significant improvement in depressive symptoms and anxiety since uptitration of nortriptyline.  Psychosocial stressors includes chronic pain, and loss of her mother in June 2021.  Will continue venlafaxine and nortriptyline to target depression and anxiety.    This clinician has discussed the side effect associated with medication prescribed during this encounter. Please refer to notes in the previous encounters for more details.      Plan Continue venlafaxine 225 mg daily Continue Nortriptyline 50 mg at night - dry mouth Next appointment:  7/25 at 3:30 for 30 mins, in person - on Ambien 10 mg at night,  valium 5 mg TID   The patient demonstrates the following risk factors for suicide: Chronic risk factors for suicide include: psychiatric disorder of depression. Acute risk factors for suicide include: unemployment. Protective factors for this patient include: positive social support, coping skills and hope for the future. Considering these factors, the overall suicide risk at this point appears to be low. Patient is appropriate for outpatient follow up.      Collaboration of Care: Collaboration of Care: Other N/A  Patient/Guardian was advised Release of Information must be obtained prior to any record release in order to collaborate their care with an outside provider. Patient/Guardian was advised if they have not already done so to contact the registration department to sign all necessary forms in order for [place] to release information regarding their care.   Consent: Patient/Guardian gives verbal consent for treatment and assignment of benefits for services provided during this visit. Patient/Guardian expressed understanding and agreed to proceed.    Norman Clay, MD 01/13/2022, 2:26 PM

## 2022-01-12 DIAGNOSIS — M47817 Spondylosis without myelopathy or radiculopathy, lumbosacral region: Secondary | ICD-10-CM | POA: Diagnosis not present

## 2022-01-12 DIAGNOSIS — M62830 Muscle spasm of back: Secondary | ICD-10-CM | POA: Diagnosis not present

## 2022-01-12 DIAGNOSIS — G4700 Insomnia, unspecified: Secondary | ICD-10-CM | POA: Diagnosis not present

## 2022-01-12 DIAGNOSIS — G894 Chronic pain syndrome: Secondary | ICD-10-CM | POA: Diagnosis not present

## 2022-01-13 ENCOUNTER — Telehealth (INDEPENDENT_AMBULATORY_CARE_PROVIDER_SITE_OTHER): Payer: Medicare Other | Admitting: Psychiatry

## 2022-01-13 ENCOUNTER — Encounter: Payer: Self-pay | Admitting: Psychiatry

## 2022-01-13 DIAGNOSIS — F330 Major depressive disorder, recurrent, mild: Secondary | ICD-10-CM | POA: Diagnosis not present

## 2022-01-13 DIAGNOSIS — F411 Generalized anxiety disorder: Secondary | ICD-10-CM | POA: Diagnosis not present

## 2022-01-13 MED ORDER — NORTRIPTYLINE HCL 50 MG PO CAPS: 50.0000 mg | ORAL_CAPSULE | Freq: Every day | ORAL | 0 refills | Status: DC

## 2022-01-13 MED ORDER — VENLAFAXINE HCL ER 75 MG PO CP24
225.0000 mg | ORAL_CAPSULE | Freq: Every day | ORAL | 0 refills | Status: DC
Start: 2022-02-11 — End: 2022-11-28

## 2022-01-13 NOTE — Patient Instructions (Signed)
Continue venlafaxine 225 mg daily Continue Nortriptyline 50 mg at night  Next appointment:  7/25 at 3:30

## 2022-01-20 ENCOUNTER — Other Ambulatory Visit: Payer: Self-pay | Admitting: Internal Medicine

## 2022-01-20 DIAGNOSIS — E7800 Pure hypercholesterolemia, unspecified: Secondary | ICD-10-CM

## 2022-02-03 ENCOUNTER — Ambulatory Visit: Payer: Medicare Other | Admitting: Internal Medicine

## 2022-02-04 ENCOUNTER — Other Ambulatory Visit: Payer: Self-pay | Admitting: Internal Medicine

## 2022-02-09 DIAGNOSIS — M47817 Spondylosis without myelopathy or radiculopathy, lumbosacral region: Secondary | ICD-10-CM | POA: Diagnosis not present

## 2022-02-09 DIAGNOSIS — M62830 Muscle spasm of back: Secondary | ICD-10-CM | POA: Diagnosis not present

## 2022-02-09 DIAGNOSIS — G894 Chronic pain syndrome: Secondary | ICD-10-CM | POA: Diagnosis not present

## 2022-02-09 DIAGNOSIS — G4700 Insomnia, unspecified: Secondary | ICD-10-CM | POA: Diagnosis not present

## 2022-02-16 ENCOUNTER — Telehealth: Payer: Self-pay | Admitting: Internal Medicine

## 2022-02-16 NOTE — Telephone Encounter (Signed)
Copied from [facility] [id number]. Topic: Medicare AWV >> Feb 16, 2022 10:32 AM Devoria Glassing wrote: Reason for CRM: Left message for patient to schedule Annual Wellness Visit.  Please schedule with Nurse Health Advisor [name] O'Brien-Blaney, LPN at [facility]. This appt can be telephone or office visit.  Please call [phone] ask for [facility]

## 2022-02-28 ENCOUNTER — Other Ambulatory Visit: Payer: Self-pay | Admitting: Internal Medicine

## 2022-03-04 ENCOUNTER — Telehealth: Payer: Self-pay | Admitting: Internal Medicine

## 2022-03-04 NOTE — Telephone Encounter (Signed)
I called patient to schedule with Dr. Nicki Reaper bc patient has not been seen in one year. Will refill metoprolol for 30 days.

## 2022-03-05 NOTE — Progress Notes (Deleted)
[place] MD/PA/NP OP Progress Note  03/05/2022 6:30 PM [name]  MRN:  [id number]  Chief Complaint: No chief complaint on file.  HPI: *** Visit Diagnosis: No diagnosis found.  Past Psychiatric History: Please see initial evaluation for full details. I have reviewed the history. No updates at this time.     Past Medical History:  Past Medical History:  Diagnosis Date   Allergic rhinitis    Anxiety    Arthralgia    Asthma    Bronchitis, chronic (HCC)    Chronic back pain    Collagenous colitis    followed by Dr Tiffany Kocher   COVID-19    09/03/20 with pneumonia   Depression    Diplopia 11/15/2013   Dysphagia    Endometriosis    Fibrocystic breast disease    GERD (gastroesophageal reflux disease)    History of colon polyps    Hyperlipidemia    Hypertension    Hypokalemia    IBS (irritable bowel syndrome)    Memory difficulty 11/15/2013   Migraine headache    Pneumonia 2017   x2   PONV (postoperative nausea and vomiting)    used patch   Sleep apnea    recommended CPAP   Ulcer disease    Urine incontinence     Past Surgical History:  Procedure Laterality Date   ABDOMINAL HYSTERECTOMY     endometriosis   BACK SURGERY     x6   BREAST BIOPSY Right    COLONOSCOPY WITH PROPOFOL N/A 06/25/2015   Procedure: COLONOSCOPY WITH PROPOFOL;  Surgeon: Manya Silvas, MD;  Location: [facility];  Service: Endoscopy;  Laterality: N/A;   DILATION AND CURETTAGE OF UTERUS     and hysteroscopy   ESOPHAGOGASTRODUODENOSCOPY N/A 06/25/2015   Procedure: ESOPHAGOGASTRODUODENOSCOPY (EGD);  Surgeon: Manya Silvas, MD;  Location: [facility] ENDOSCOPY;  Service: Endoscopy;  Laterality: N/A;   insertion of permanent spinal cord stimulator     insertion of trial spinal cord stimulator     LUMBAR DISC SURGERY     LUMBAR FUSION  4/05 and 8/07   LUMBAR MICRODISCECTOMY  03/28/03   L4-5 L5   MOUTH SURGERY     NASAL SEPTOPLASTY W/ TURBINOPLASTY     removal of spinal cord stimulator     SEPTOPLASTY      with reduction of turbinates   THYROIDECTOMY N/A 06/06/2016   Procedure: THYROIDECTOMY;  Surgeon: Robert Bellow, MD;  Location: [facility];  Service: General;  Laterality: N/A;    Family Psychiatric History: Please see initial evaluation for full details. I have reviewed the history. No updates at this time.     Family History:  Family History  Problem Relation Age of Onset   Allergies Mother    Hypertension Mother    Arthritis Mother    Hyperlipidemia Mother    Stroke Mother    Hypertension Father    Arthritis Father    Diabetes Father    Hypertension Brother    Diabetes Brother    Arthritis Maternal Grandmother    Hyperlipidemia Maternal Grandmother    Hyperlipidemia Maternal Grandfather    Arthritis Paternal Grandmother    Arthritis Paternal Grandfather    Asthma Other        several family member   Leukemia Other        uncle   Lymphoma Maternal Aunt        non hodgkins    Social History:  Social History   Socioeconomic History   Marital status:  Married    Spouse name: douglas   Number of children: 1   Years of education: 55 th   Highest education level: High school graduate  Occupational History   Occupation: Disabled    Employer: DISABLED  Tobacco Use   Smoking status: Never   Smokeless tobacco: Never  Vaping Use   Vaping Use: Never used  Substance and Sexual Activity   Alcohol use: No    Alcohol/week: 0.0 standard drinks of alcohol   Drug use: No   Sexual activity: Not Currently  Other Topics Concern   Not on file  Social History Narrative   ** Merged History Encounter **       Social Determinants of Health   Financial Resource Strain: Low Risk  (05/05/2020)   Overall Financial Resource Strain (CARDIA)    Difficulty of Paying Living Expenses: Not hard at all  Food Insecurity: No Food Insecurity (05/05/2020)   Hunger Vital Sign    Worried About Running Out of Food in the Last Year: Never true    [place] in the Last Year: Never  true  Transportation Needs: No Transportation Needs (05/05/2020)   PRAPARE - Hydrologist (Medical): No    Lack of Transportation (Non-Medical): No  Physical Activity: Inactive (07/17/2017)   Exercise Vital Sign    Days of Exercise per Week: 0 days    Minutes of Exercise per Session: 0 min  Stress: No Stress Concern Present (05/05/2020)   [place]    Feeling of Stress : Not at all  Social Connections: Unknown (05/05/2020)   Social Connection and Isolation Panel [NHANES]    Frequency of Communication with Friends and Family: Not on file    Frequency of Social Gatherings with Friends and Family: Not on file    Attends Religious Services: Not on file    Active Member of Clubs or Organizations: Not on file    Attends Archivist Meetings: Not on file    Marital Status: Married    Allergies:  Allergies  Allergen Reactions   Fentanyl Other (See Comments)    Other reaction(s): Headache, Other (See Comments) headache SEVERE HEADACHE headache   Sumatriptan Other (See Comments)    Other reaction(s): Headache Severe headache   Doxycycline Other (See Comments)    Other reaction(s): Headache, Other (See Comments) REACTION: causes severe abd pain REACTION: causes severe abd pain REACTION: causes severe abd pain   Montelukast Other (See Comments)    SEVERE HEADACHE   Oxymorphone Other (See Comments)    REACTION: severe headaches   Benzoin Rash   Buspirone Other (See Comments)    SEVERE HEADACHE   Ciprofloxacin Nausea Only   Conjugated Estrogens Other (See Comments)    headaches   Cymbalta [Duloxetine Hcl] Other (See Comments)    Headache   Duloxetine Other (See Comments)    Headache   Erythromycin Ethylsuccinate Other (See Comments)    Stomach ache   Estradiol Other (See Comments)    headache   Metronidazole Other (See Comments)    abd pain   Mirtazapine Other (See Comments)     headache    Metabolic Disorder Labs: Lab Results  Component Value Date   HGBA1C 5.4 05/28/2020   No results found for: "PROLACTIN" Lab Results  Component Value Date   CHOL 202 (H) 02/23/2021   TRIG 217.0 (H) 02/23/2021   HDL 37.50 (L) 02/23/2021   CHOLHDL 5 02/23/2021  VLDL 43.4 (H) 02/23/2021   LDLCALC 121 (H) 07/12/2016   LDLCALC 187 (H) 08/03/2015   Lab Results  Component Value Date   TSH 0.480 09/06/2020   TSH 0.590 09/04/2020    Therapeutic Level Labs: No results found for: "LITHIUM" No results found for: "VALPROATE" No results found for: "CBMZ"  Current Medications: Current Outpatient Medications  Medication Sig Dispense Refill   albuterol (PROVENTIL) (2.5 MG/3ML) 0.083% nebulizer solution Take 3 mLs (2.5 mg total) by nebulization every 4 (four) hours as needed for wheezing or shortness of breath. 360 mL 2   albuterol (VENTOLIN HFA) 108 (90 Base) MCG/ACT inhaler Inhale 2 puffs into the lungs every 4 (four) hours as needed for wheezing or shortness of breath. 1 each 11   amLODipine (NORVASC) 5 MG tablet TAKE 1 TABLET BY MOUTH EVERY DAY 90 tablet 1   ascorbic acid (VITAMIN C) 500 MG tablet Take 1 tablet (500 mg total) by mouth daily. 30 tablet 0   bisacodyl (DULCOLAX) 5 MG EC tablet Take 5 mg by mouth daily as needed for moderate constipation.     diazepam (VALIUM) 5 MG tablet Take 5 mg by mouth 3 (three) times daily as needed for anxiety.     docusate sodium (COLACE) 100 MG capsule Take 100 mg by mouth 2 (two) times daily.     esomeprazole (NEXIUM) 20 MG capsule Take 20 mg by mouth daily at 12 noon.     GRALISE 600 MG TABS Take 600 mg by mouth 3 (three) times daily.     hydrochlorothiazide (HYDRODIURIL) 25 MG tablet Take 25 mg by mouth daily.  11   KLOR-CON M10 10 MEQ tablet TAKE 1 TABLET BY MOUTH EVERY DAY 30 tablet 0   metoprolol tartrate (LOPRESSOR) 50 MG tablet TAKE 1/2 TABLET BY MOUTH TWICE A DAY (NEED OFFICE VISIT) 30 tablet 0   Multiple Vitamin  (MULTIVITAMIN) capsule Take 1 capsule by mouth daily.     nortriptyline (PAMELOR) 50 MG capsule Take 1 capsule (50 mg total) by mouth at bedtime. 90 capsule 0   Omega-3 Fatty Acids (FISH OIL) 1200 MG CAPS Take 1 capsule by mouth daily.     ondansetron (ZOFRAN) 4 MG tablet Take 1 tablet (4 mg total) by mouth every 8 (eight) hours as needed. 40 tablet 0   Oxycodone HCl 10 MG TABS Take 10 mg by mouth 3 (three) times daily as needed (breakthrough pain).     pravastatin (PRAVACHOL) 20 MG tablet TAKE 1 TABLET BY MOUTH EVERY DAY 90 tablet 0   predniSONE (DELTASONE) 10 MG tablet Take 6 tablets x 1 day and then decrease by 1/2 tablet per day until down to zero mg. 39 tablet 0   sodium chloride (OCEAN) 0.65 % SOLN nasal spray Place 2 sprays into both nostrils daily as needed for congestion. 30 mL 2   TIROSINT 137 MCG CAPS TAKE 1 CAPSULE BY MOUTH EVERY DAY BEFORE BREAKFAST 30 capsule 2   TRELEGY ELLIPTA 100-62.5-25 MCG/ACT AEPB INHALE 1 PUFF BY MOUTH EVERY DAY 60 each 3   venlafaxine XR (EFFEXOR-XR) 75 MG 24 hr capsule Take 3 capsules (225 mg total) by mouth daily with breakfast. 270 capsule 0   XTAMPZA ER 36 MG C12A Take 36 mg by mouth every 12 (twelve) hours.  0   zinc sulfate 220 (50 Zn) MG capsule Take 1 capsule (220 mg total) by mouth daily. 30 capsule 0   zolpidem (AMBIEN) 10 MG tablet Take 10 mg by mouth at bedtime as  needed for sleep.     No current facility-administered medications for this visit.     Musculoskeletal: Strength & Muscle Tone: within normal limits Gait & Station: normal Patient leans: N/A  Psychiatric Specialty Exam: Review of Systems  There were no vitals taken for this visit.There is no height or weight on file to calculate BMI.  General Appearance: {Appearance:22683}  Eye Contact:  {BHH EYE CONTACT:22684}  Speech:  Clear and Coherent  Volume:  Normal  Mood:  {BHH MOOD:22306}  Affect:  {Affect (PAA):22687}  Thought Process:  Coherent  Orientation:  Full (Time, Place,  and Person)  Thought Content: Logical   Suicidal Thoughts:  {ST/HT (PAA):22692}  Homicidal Thoughts:  {ST/HT (PAA):22692}  Memory:  Immediate;   Good  Judgement:  {Judgement (PAA):22694}  Insight:  {Insight (PAA):22695}  Psychomotor Activity:  Normal  Concentration:  Concentration: Good and Attention Span: Good  Recall:  Good  Fund of Knowledge: Good  Language: Good  Akathisia:  No  Handed:  Right  AIMS (if indicated): not done  Assets:  Communication Skills Desire for Improvement  ADL's:  Intact  Cognition: WNL  Sleep:  {BHH GOOD/FAIR/POOR:22877}   Screenings: PHQ2-9    Flowsheet Row Video Visit from 08/26/2021 in [facility] Visit from 02/23/2021 in [facility] Video Visit from 11/05/2020 in [facility] Visit from 06/05/2020 in [facility] from 05/05/2020 in [facility]  PHQ-2 Total Score 2 4 0 5 1  PHQ-9 Total Score 14 12 0 20 --      Flowsheet Row Video Visit from 08/26/2021 in [facility] Video Visit from 01/14/2021 in [facility] to Hosp-Admission (Discharged) from 09/03/2020 in [facility] Error: Q3, 4, or 5 should not be populated when Q2 is No Low Risk No Risk        Assessment and Plan:  [name] is a 61 y.o. year old female with a history of depression, anxiety,  HTN, HL, dysphasia, asthma. migraine, sleep apnea, s/p covid pneumonia in 08/2020, who presents for follow up appointment for below.     1. MDD (major depressive disorder), recurrent episode, mild ([place]) 2. Anxiety state There has been significant improvement in depressive symptoms and anxiety since uptitration of nortriptyline.  Psychosocial stressors includes chronic pain, and loss of her mother in June 2021.  Will continue venlafaxine and  nortriptyline to target depression and anxiety.    This clinician has discussed the side effect associated with medication prescribed during this encounter. Please refer to notes in the previous encounters for more details.       Plan Continue venlafaxine 225 mg daily Continue Nortriptyline 50 mg at night - dry mouth Next appointment:  7/25 at 3:30 for 30 mins, in person - on Ambien 10 mg at night, valium 5 mg TID   The patient demonstrates the following risk factors for suicide: Chronic risk factors for suicide include: psychiatric disorder of depression. Acute risk factors for suicide include: unemployment. Protective factors for this patient include: positive social support, coping skills and hope for the future. Considering these factors, the overall suicide risk at this point appears to be low. Patient is appropriate for outpatient follow up.          Collaboration of Care: Collaboration of Care: {BH OP Collaboration of PJKD:[id number]}  Patient/Guardian was advised Release of Information must be obtained  prior to any record release in order to collaborate their care with an outside provider. Patient/Guardian was advised if they have not already done so to contact the registration department to sign all necessary forms in order for [place] to release information regarding their care.   Consent: Patient/Guardian gives verbal consent for treatment and assignment of benefits for services provided during this visit. Patient/Guardian expressed understanding and agreed to proceed.    Norman Clay, MD 03/05/2022, 6:30 PM

## 2022-03-08 ENCOUNTER — Ambulatory Visit: Payer: Medicare Other | Admitting: Psychiatry

## 2022-03-10 DIAGNOSIS — M62830 Muscle spasm of back: Secondary | ICD-10-CM | POA: Diagnosis not present

## 2022-03-10 DIAGNOSIS — G894 Chronic pain syndrome: Secondary | ICD-10-CM | POA: Diagnosis not present

## 2022-03-10 DIAGNOSIS — M47817 Spondylosis without myelopathy or radiculopathy, lumbosacral region: Secondary | ICD-10-CM | POA: Diagnosis not present

## 2022-03-10 DIAGNOSIS — G4700 Insomnia, unspecified: Secondary | ICD-10-CM | POA: Diagnosis not present

## 2022-03-11 ENCOUNTER — Other Ambulatory Visit: Payer: Self-pay

## 2022-03-11 MED ORDER — HYDROCHLOROTHIAZIDE 25 MG PO TABS: 25.0000 mg | ORAL_TABLET | Freq: Every day | ORAL | 1 refills | Status: DC

## 2022-03-11 NOTE — Telephone Encounter (Signed)
LM letting patient know that I did refill until patient sees Dr. Nicki Reaper.

## 2022-03-11 NOTE — Telephone Encounter (Signed)
Patient called and wanted to know if the Dr would refill the following hydrochlorothiazide (HYDRODIURIL) 25 MG tablet.  Patient has been scheduled for OV on 08/15'@230pm'$ .

## 2022-03-23 ENCOUNTER — Other Ambulatory Visit: Payer: Self-pay

## 2022-03-23 ENCOUNTER — Other Ambulatory Visit: Payer: Self-pay | Admitting: Otolaryngology

## 2022-03-23 DIAGNOSIS — J324 Chronic pansinusitis: Secondary | ICD-10-CM | POA: Diagnosis not present

## 2022-03-23 DIAGNOSIS — J3489 Other specified disorders of nose and nasal sinuses: Secondary | ICD-10-CM | POA: Diagnosis not present

## 2022-03-23 DIAGNOSIS — J329 Chronic sinusitis, unspecified: Secondary | ICD-10-CM

## 2022-03-29 ENCOUNTER — Ambulatory Visit: Payer: Medicare Other | Admitting: Internal Medicine

## 2022-03-29 NOTE — Progress Notes (Deleted)
[place] MD/PA/NP OP Progress Note  03/29/2022 5:39 PM [name]  MRN:  [id number]  Chief Complaint: No chief complaint on file.  HPI: *** Visit Diagnosis: No diagnosis found.  Past Psychiatric History: Please see initial evaluation for full details. I have reviewed the history. No updates at this time.     Past Medical History:  Past Medical History:  Diagnosis Date   Allergic rhinitis    Anxiety    Arthralgia    Asthma    Bronchitis, chronic (HCC)    Chronic back pain    Collagenous colitis    followed by Dr Tiffany Kocher   COVID-19    09/03/20 with pneumonia   Depression    Diplopia 11/15/2013   Dysphagia    Endometriosis    Fibrocystic breast disease    GERD (gastroesophageal reflux disease)    History of colon polyps    Hyperlipidemia    Hypertension    Hypokalemia    IBS (irritable bowel syndrome)    Memory difficulty 11/15/2013   Migraine headache    Pneumonia 2017   x2   PONV (postoperative nausea and vomiting)    used patch   Sleep apnea    recommended CPAP   Ulcer disease    Urine incontinence     Past Surgical History:  Procedure Laterality Date   ABDOMINAL HYSTERECTOMY     endometriosis   BACK SURGERY     x6   BREAST BIOPSY Right    COLONOSCOPY WITH PROPOFOL N/A 06/25/2015   Procedure: COLONOSCOPY WITH PROPOFOL;  Surgeon: Manya Silvas, MD;  Location: [facility];  Service: Endoscopy;  Laterality: N/A;   DILATION AND CURETTAGE OF UTERUS     and hysteroscopy   ESOPHAGOGASTRODUODENOSCOPY N/A 06/25/2015   Procedure: ESOPHAGOGASTRODUODENOSCOPY (EGD);  Surgeon: Manya Silvas, MD;  Location: [facility] ENDOSCOPY;  Service: Endoscopy;  Laterality: N/A;   insertion of permanent spinal cord stimulator     insertion of trial spinal cord stimulator     LUMBAR DISC SURGERY     LUMBAR FUSION  4/05 and 8/07   LUMBAR MICRODISCECTOMY  03/28/03   L4-5 L5   MOUTH SURGERY     NASAL SEPTOPLASTY W/ TURBINOPLASTY     removal of spinal cord stimulator     SEPTOPLASTY      with reduction of turbinates   THYROIDECTOMY N/A 06/06/2016   Procedure: THYROIDECTOMY;  Surgeon: Robert Bellow, MD;  Location: [facility];  Service: General;  Laterality: N/A;    Family Psychiatric History: Please see initial evaluation for full details. I have reviewed the history. No updates at this time.     Family History:  Family History  Problem Relation Age of Onset   Allergies Mother    Hypertension Mother    Arthritis Mother    Hyperlipidemia Mother    Stroke Mother    Hypertension Father    Arthritis Father    Diabetes Father    Hypertension Brother    Diabetes Brother    Arthritis Maternal Grandmother    Hyperlipidemia Maternal Grandmother    Hyperlipidemia Maternal Grandfather    Arthritis Paternal Grandmother    Arthritis Paternal Grandfather    Asthma Other        several family member   Leukemia Other        uncle   Lymphoma Maternal Aunt        non hodgkins    Social History:  Social History   Socioeconomic History   Marital status:  Married    Spouse name: douglas   Number of children: 1   Years of education: 49 th   Highest education level: High school graduate  Occupational History   Occupation: Disabled    Employer: DISABLED  Tobacco Use   Smoking status: Never   Smokeless tobacco: Never  Vaping Use   Vaping Use: Never used  Substance and Sexual Activity   Alcohol use: No    Alcohol/week: 0.0 standard drinks of alcohol   Drug use: No   Sexual activity: Not Currently  Other Topics Concern   Not on file  Social History Narrative   ** Merged History Encounter **       Social Determinants of Health   Financial Resource Strain: Low Risk  (05/05/2020)   Overall Financial Resource Strain (CARDIA)    Difficulty of Paying Living Expenses: Not hard at all  Food Insecurity: No Food Insecurity (05/05/2020)   Hunger Vital Sign    Worried About Running Out of Food in the Last Year: Never true    [place] in the Last Year: Never  true  Transportation Needs: No Transportation Needs (05/05/2020)   PRAPARE - Hydrologist (Medical): No    Lack of Transportation (Non-Medical): No  Physical Activity: Inactive (07/17/2017)   Exercise Vital Sign    Days of Exercise per Week: 0 days    Minutes of Exercise per Session: 0 min  Stress: No Stress Concern Present (05/05/2020)   [place]    Feeling of Stress : Not at all  Social Connections: Unknown (05/05/2020)   Social Connection and Isolation Panel [NHANES]    Frequency of Communication with Friends and Family: Not on file    Frequency of Social Gatherings with Friends and Family: Not on file    Attends Religious Services: Not on file    Active Member of Clubs or Organizations: Not on file    Attends Archivist Meetings: Not on file    Marital Status: Married    Allergies:  Allergies  Allergen Reactions   Fentanyl Other (See Comments)    Other reaction(s): Headache, Other (See Comments) headache SEVERE HEADACHE headache   Sumatriptan Other (See Comments)    Other reaction(s): Headache Severe headache   Doxycycline Other (See Comments)    Other reaction(s): Headache, Other (See Comments) REACTION: causes severe abd pain REACTION: causes severe abd pain REACTION: causes severe abd pain   Montelukast Other (See Comments)    SEVERE HEADACHE   Oxymorphone Other (See Comments)    REACTION: severe headaches   Benzoin Rash   Buspirone Other (See Comments)    SEVERE HEADACHE   Ciprofloxacin Nausea Only   Conjugated Estrogens Other (See Comments)    headaches   Cymbalta [Duloxetine Hcl] Other (See Comments)    Headache   Duloxetine Other (See Comments)    Headache   Erythromycin Ethylsuccinate Other (See Comments)    Stomach ache   Estradiol Other (See Comments)    headache   Metronidazole Other (See Comments)    abd pain   Mirtazapine Other (See Comments)     headache    Metabolic Disorder Labs: Lab Results  Component Value Date   HGBA1C 5.4 05/28/2020   No results found for: "PROLACTIN" Lab Results  Component Value Date   CHOL 202 (H) 02/23/2021   TRIG 217.0 (H) 02/23/2021   HDL 37.50 (L) 02/23/2021   CHOLHDL 5 02/23/2021  VLDL 43.4 (H) 02/23/2021   LDLCALC 121 (H) 07/12/2016   LDLCALC 187 (H) 08/03/2015   Lab Results  Component Value Date   TSH 0.480 09/06/2020   TSH 0.590 09/04/2020    Therapeutic Level Labs: No results found for: "LITHIUM" No results found for: "VALPROATE" No results found for: "CBMZ"  Current Medications: Current Outpatient Medications  Medication Sig Dispense Refill   albuterol (PROVENTIL) (2.5 MG/3ML) 0.083% nebulizer solution Take 3 mLs (2.5 mg total) by nebulization every 4 (four) hours as needed for wheezing or shortness of breath. 360 mL 2   albuterol (VENTOLIN HFA) 108 (90 Base) MCG/ACT inhaler Inhale 2 puffs into the lungs every 4 (four) hours as needed for wheezing or shortness of breath. 1 each 11   amLODipine (NORVASC) 5 MG tablet TAKE 1 TABLET BY MOUTH EVERY DAY 90 tablet 1   ascorbic acid (VITAMIN C) 500 MG tablet Take 1 tablet (500 mg total) by mouth daily. 30 tablet 0   bisacodyl (DULCOLAX) 5 MG EC tablet Take 5 mg by mouth daily as needed for moderate constipation.     diazepam (VALIUM) 5 MG tablet Take 5 mg by mouth 3 (three) times daily as needed for anxiety.     docusate sodium (COLACE) 100 MG capsule Take 100 mg by mouth 2 (two) times daily.     esomeprazole (NEXIUM) 20 MG capsule Take 20 mg by mouth daily at 12 noon.     GRALISE 600 MG TABS Take 600 mg by mouth 3 (three) times daily.     hydrochlorothiazide (HYDRODIURIL) 25 MG tablet Take 1 tablet (25 mg total) by mouth daily. 30 tablet 1   KLOR-CON M10 10 MEQ tablet TAKE 1 TABLET BY MOUTH EVERY DAY 30 tablet 0   metoprolol tartrate (LOPRESSOR) 50 MG tablet TAKE 1/2 TABLET BY MOUTH TWICE A DAY (NEED OFFICE VISIT) 30 tablet 0    Multiple Vitamin (MULTIVITAMIN) capsule Take 1 capsule by mouth daily.     nortriptyline (PAMELOR) 50 MG capsule Take 1 capsule (50 mg total) by mouth at bedtime. 90 capsule 0   Omega-3 Fatty Acids (FISH OIL) 1200 MG CAPS Take 1 capsule by mouth daily.     ondansetron (ZOFRAN) 4 MG tablet Take 1 tablet (4 mg total) by mouth every 8 (eight) hours as needed. 40 tablet 0   Oxycodone HCl 10 MG TABS Take 10 mg by mouth 3 (three) times daily as needed (breakthrough pain).     pravastatin (PRAVACHOL) 20 MG tablet TAKE 1 TABLET BY MOUTH EVERY DAY 90 tablet 0   predniSONE (DELTASONE) 10 MG tablet Take 6 tablets x 1 day and then decrease by 1/2 tablet per day until down to zero mg. 39 tablet 0   sodium chloride (OCEAN) 0.65 % SOLN nasal spray Place 2 sprays into both nostrils daily as needed for congestion. 30 mL 2   TIROSINT 137 MCG CAPS TAKE 1 CAPSULE BY MOUTH EVERY DAY BEFORE BREAKFAST 30 capsule 2   TRELEGY ELLIPTA 100-62.5-25 MCG/ACT AEPB INHALE 1 PUFF BY MOUTH EVERY DAY 60 each 3   venlafaxine XR (EFFEXOR-XR) 75 MG 24 hr capsule Take 3 capsules (225 mg total) by mouth daily with breakfast. 270 capsule 0   XTAMPZA ER 36 MG C12A Take 36 mg by mouth every 12 (twelve) hours.  0   zinc sulfate 220 (50 Zn) MG capsule Take 1 capsule (220 mg total) by mouth daily. 30 capsule 0   zolpidem (AMBIEN) 10 MG tablet Take 10 mg by  mouth at bedtime as needed for sleep.     No current facility-administered medications for this visit.     Musculoskeletal: Strength & Muscle Tone: within normal limits Gait & Station: normal Patient leans: N/A  Psychiatric Specialty Exam: Review of Systems  There were no vitals taken for this visit.There is no height or weight on file to calculate BMI.  General Appearance: {Appearance:22683}  Eye Contact:  {BHH EYE CONTACT:22684}  Speech:  Clear and Coherent  Volume:  Normal  Mood:  {BHH MOOD:22306}  Affect:  {Affect (PAA):22687}  Thought Process:  Coherent  Orientation:   Full (Time, Place, and Person)  Thought Content: Logical   Suicidal Thoughts:  {ST/HT (PAA):22692}  Homicidal Thoughts:  {ST/HT (PAA):22692}  Memory:  Immediate;   Good  Judgement:  {Judgement (PAA):22694}  Insight:  {Insight (PAA):22695}  Psychomotor Activity:  Normal  Concentration:  Concentration: Good and Attention Span: Good  Recall:  Good  Fund of Knowledge: Good  Language: Good  Akathisia:  No  Handed:  Right  AIMS (if indicated): not done  Assets:  Communication Skills Desire for Improvement  ADL's:  Intact  Cognition: WNL  Sleep:  {BHH GOOD/FAIR/POOR:22877}   Screenings: PHQ2-9    Flowsheet Row Video Visit from 08/26/2021 in [facility] Visit from 02/23/2021 in [facility] Video Visit from 11/05/2020 in [facility] Office Visit from 06/05/2020 in [facility] from 05/05/2020 in [facility]  PHQ-2 Total Score 2 4 0 5 1  PHQ-9 Total Score 14 12 0 20 --      Flowsheet Row Video Visit from 08/26/2021 in [place] Video Visit from 01/14/2021 in [facility] ED to Hosp-Admission (Discharged) from 09/03/2020 in [place] Error: Q3, 4, or 5 should not be populated when Q2 is No Low Risk No Risk        Assessment and Plan:  [name] is a 61 y.o. year old female with a history of depression, anxiety,  HTN, HL, dysphasia, asthma. migraine, sleep apnea, s/p covid pneumonia in 08/2020, who presents for follow up appointment for below.    1. MDD (major depressive disorder), recurrent episode, mild ([place]) 2. Anxiety state There has been significant improvement in depressive symptoms and anxiety since uptitration of nortriptyline.  Psychosocial stressors includes chronic pain, and loss of her mother in June 2021.  Will continue  venlafaxine and nortriptyline to target depression and anxiety.    This clinician has discussed the side effect associated with medication prescribed during this encounter. Please refer to notes in the previous encounters for more details.       Plan Continue venlafaxine 225 mg daily Continue Nortriptyline 50 mg at night - dry mouth Next appointment:  7/25 at 3:30 for 30 mins, in person - on Ambien 10 mg at night, valium 5 mg TID   The patient demonstrates the following risk factors for suicide: Chronic risk factors for suicide include: psychiatric disorder of depression. Acute risk factors for suicide include: unemployment. Protective factors for this patient include: positive social support, coping skills and hope for the future. Considering these factors, the overall suicide risk at this point appears to be low. Patient is appropriate for outpatient follow up.         Collaboration of Care: Collaboration of Care: {BH OP Collaboration of TSVX:79390300}  Patient/Guardian was advised Release of Information must  be obtained prior to any record release in order to collaborate their care with an outside provider. Patient/Guardian was advised if they have not already done so to contact the registration department to sign all necessary forms in order for [place] to release information regarding their care.   Consent: Patient/Guardian gives verbal consent for treatment and assignment of benefits for services provided during this visit. Patient/Guardian expressed understanding and agreed to proceed.    Norman Clay, MD 03/29/2022, 5:39 PM

## 2022-03-31 ENCOUNTER — Ambulatory Visit: Payer: Medicare Other | Admitting: Psychiatry

## 2022-04-04 ENCOUNTER — Other Ambulatory Visit: Payer: Self-pay | Admitting: Internal Medicine

## 2022-04-07 DIAGNOSIS — M62830 Muscle spasm of back: Secondary | ICD-10-CM | POA: Diagnosis not present

## 2022-04-07 DIAGNOSIS — M47817 Spondylosis without myelopathy or radiculopathy, lumbosacral region: Secondary | ICD-10-CM | POA: Diagnosis not present

## 2022-04-07 DIAGNOSIS — G4700 Insomnia, unspecified: Secondary | ICD-10-CM | POA: Diagnosis not present

## 2022-04-07 DIAGNOSIS — G894 Chronic pain syndrome: Secondary | ICD-10-CM | POA: Diagnosis not present

## 2022-04-07 DIAGNOSIS — Z79891 Long term (current) use of opiate analgesic: Secondary | ICD-10-CM | POA: Diagnosis not present

## 2022-04-08 ENCOUNTER — Inpatient Hospital Stay: Admission: RE | Admit: 2022-04-08 | Payer: Medicare Other | Source: Ambulatory Visit

## 2022-04-16 ENCOUNTER — Other Ambulatory Visit: Payer: Self-pay | Admitting: Internal Medicine

## 2022-04-16 ENCOUNTER — Other Ambulatory Visit: Payer: Self-pay | Admitting: Psychiatry

## 2022-04-16 DIAGNOSIS — E7800 Pure hypercholesterolemia, unspecified: Secondary | ICD-10-CM

## 2022-04-25 ENCOUNTER — Telehealth: Payer: Self-pay | Admitting: Internal Medicine

## 2022-04-25 NOTE — Telephone Encounter (Signed)
Copied from [place] [phone]. Topic: Medicare AWV >> Apr 25, 2022  1:15 PM Devoria Glassing wrote: Reason for CRM: Left message for patient to schedule Annual Wellness Visit.  Please schedule with Nurse Health Advisor [name] O'Brien-Blaney, LPN at [facility]. This appt can be telephone or office visit.  Please call [phone] ask for [facility]

## 2022-05-05 ENCOUNTER — Telehealth: Payer: Self-pay | Admitting: Internal Medicine

## 2022-05-05 NOTE — Telephone Encounter (Signed)
Copied from [place] [id number]. Topic: Medicare AWV >> May 05, 2022 11:59 AM [name] wrote: Reason for CRM: Returned patient call to schedule her AWV.  I schedule her AWV for 05/17/22 at 3:30pm for a telephone visit w/[name].  Please confirm appt date. Khc

## 2022-05-07 ENCOUNTER — Other Ambulatory Visit: Payer: Self-pay | Admitting: Internal Medicine

## 2022-05-09 ENCOUNTER — Other Ambulatory Visit: Payer: Self-pay | Admitting: Internal Medicine

## 2022-05-10 ENCOUNTER — Telehealth: Payer: Self-pay | Admitting: Internal Medicine

## 2022-05-10 NOTE — Telephone Encounter (Signed)
Pt called stating she would like to have orders resent for mammogram and ultrasound. Pt would like to be called

## 2022-05-11 NOTE — Telephone Encounter (Signed)
Left message to return call to office.

## 2022-05-11 NOTE — Telephone Encounter (Signed)
Need more information. Is she having problems?  She has missed or canceled multiple appts.

## 2022-05-12 NOTE — Telephone Encounter (Signed)
Left a detailed message letting pt know that given her symptoms it is very important that she gets evaluated this evening.

## 2022-05-12 NOTE — Telephone Encounter (Signed)
Allie called from Access Nurse to state the recommendation for patient was to go to the ED.  Allie states patient declined.

## 2022-05-12 NOTE — Telephone Encounter (Signed)
Patient called to state she is returning our call.  I read [name]'s message to her and verified the appointments she has with [place] next week.  Patient states she would like to change her MyChart visit with Dr. Einar Petersen on 05/17/2022 at 4pm to an in-person visit.  Patient states she has some sores on her arms that she would like for her to see.  Patient states she would like to talk to Dr. Nicki Petersen about lumps - one on each breast. Patient states she is still having really sharp pains in both her nipples on and off.  Patient states she is having pain on and off under her armpit and feels like it goes around to her ribs and her shoulder blade.  Patient states she is currently having mild pain under her arm and it goes around to her back, under her shoulder blade.  Patient states she is having some shortness of breath, but she states it could be her asthma.  I transferred call to Access Nurse.

## 2022-05-12 NOTE — Telephone Encounter (Signed)
Given pain and sob, she needs to be evaluated now.

## 2022-05-12 NOTE — Telephone Encounter (Signed)
Pt was transferred to access nurse due to pt c/o having mild pain in her side going to her back. shortness of breath Pain began in the arm pit and shoulder blade with nausea. Per access nurse pain started last week and this week has been mild intermittent chest pain in the breast or under the arm. Lump in bil breast. Denies any side or chest pain now but has chronic low back pain. Hx of asthma and sob. Correction caller declined triage outcome. Says shes not going to the hospital. Pulse ox 95% and HR 71 at this time. Pt denied UC or ED eval at this time.

## 2022-05-12 NOTE — Telephone Encounter (Signed)
Attempted to call one more time before leaving and it went straight to voicemail.

## 2022-05-13 NOTE — Telephone Encounter (Signed)
Agree with need for evaluation given symptoms.

## 2022-05-13 NOTE — Telephone Encounter (Signed)
LVM for patient to call back.   Ryon Layton,cma  

## 2022-05-14 ENCOUNTER — Other Ambulatory Visit: Payer: Self-pay | Admitting: Internal Medicine

## 2022-05-16 ENCOUNTER — Ambulatory Visit: Payer: Medicare Other

## 2022-05-16 ENCOUNTER — Telehealth: Payer: Self-pay

## 2022-05-16 NOTE — Telephone Encounter (Signed)
Lvm for pt to return call.

## 2022-05-16 NOTE — Telephone Encounter (Signed)
No answer when called for scheduled AWV. Let message to reschedule.  

## 2022-05-17 ENCOUNTER — Telehealth: Payer: Medicare Other | Admitting: Internal Medicine

## 2022-05-17 NOTE — Progress Notes (Deleted)
[facility] MD/PA/NP OP Progress Note  05/17/2022 1:34 PM [name]  MRN:  [id number]  Chief Complaint: No chief complaint on file.  HPI: *** Visit Diagnosis: No diagnosis found.  Past Psychiatric History: Please see initial evaluation for full details. I have reviewed the history. No updates at this time.     Past Medical History:  Past Medical History:  Diagnosis Date   Allergic rhinitis    Anxiety    Arthralgia    Asthma    Bronchitis, chronic (HCC)    Chronic back pain    Collagenous colitis    followed by Dr Tiffany Kocher   COVID-19    09/03/20 with pneumonia   Depression    Diplopia 11/15/2013   Dysphagia    Endometriosis    Fibrocystic breast disease    GERD (gastroesophageal reflux disease)    History of colon polyps    Hyperlipidemia    Hypertension    Hypokalemia    IBS (irritable bowel syndrome)    Memory difficulty 11/15/2013   Migraine headache    Pneumonia 2017   x2   PONV (postoperative nausea and vomiting)    used patch   Sleep apnea    recommended CPAP   Ulcer disease    Urine incontinence     Past Surgical History:  Procedure Laterality Date   ABDOMINAL HYSTERECTOMY     endometriosis   BACK SURGERY     x6   BREAST BIOPSY Right    COLONOSCOPY WITH PROPOFOL N/A 06/25/2015   Procedure: COLONOSCOPY WITH PROPOFOL;  Surgeon: Manya Silvas, MD;  Location: [facility];  Service: Endoscopy;  Laterality: N/A;   DILATION AND CURETTAGE OF UTERUS     and hysteroscopy   ESOPHAGOGASTRODUODENOSCOPY N/A 06/25/2015   Procedure: ESOPHAGOGASTRODUODENOSCOPY (EGD);  Surgeon: Manya Silvas, MD;  Location: [facility] ENDOSCOPY;  Service: Endoscopy;  Laterality: N/A;   insertion of permanent spinal cord stimulator     insertion of trial spinal cord stimulator     LUMBAR DISC SURGERY     LUMBAR FUSION  4/05 and 8/07   LUMBAR MICRODISCECTOMY  03/28/03   L4-5 L5   MOUTH SURGERY     NASAL SEPTOPLASTY W/ TURBINOPLASTY     removal of spinal cord stimulator     SEPTOPLASTY      with reduction of turbinates   THYROIDECTOMY N/A 06/06/2016   Procedure: THYROIDECTOMY;  Surgeon: Robert Bellow, MD;  Location: [facility];  Service: General;  Laterality: N/A;    Family Psychiatric History: Please see initial evaluation for full details. I have reviewed the history. No updates at this time.     Family History:  Family History  Problem Relation Age of Onset   Allergies Mother    Hypertension Mother    Arthritis Mother    Hyperlipidemia Mother    Stroke Mother    Hypertension Father    Arthritis Father    Diabetes Father    Hypertension Brother    Diabetes Brother    Arthritis Maternal Grandmother    Hyperlipidemia Maternal Grandmother    Hyperlipidemia Maternal Grandfather    Arthritis Paternal Grandmother    Arthritis Paternal Grandfather    Asthma Other        several family member   Leukemia Other        uncle   Lymphoma Maternal Aunt        non hodgkins    Social History:  Social History   Socioeconomic History   Marital status:  Married    Spouse name: douglas   Number of children: 1   Years of education: 32 th   Highest education level: High school graduate  Occupational History   Occupation: Disabled    Employer: DISABLED  Tobacco Use   Smoking status: Never   Smokeless tobacco: Never  Vaping Use   Vaping Use: Never used  Substance and Sexual Activity   Alcohol use: No    Alcohol/week: 0.0 standard drinks of alcohol   Drug use: No   Sexual activity: Not Currently  Other Topics Concern   Not on file  Social History Narrative   ** Merged History Encounter **       Social Determinants of Health   Financial Resource Strain: Low Risk  (05/05/2020)   Overall Financial Resource Strain (CARDIA)    Difficulty of Paying Living Expenses: Not hard at all  Food Insecurity: No Food Insecurity (05/05/2020)   Hunger Vital Sign    Worried About Running Out of Food in the Last Year: Never true    [place] in the Last Year: Never  true  Transportation Needs: No Transportation Needs (05/05/2020)   PRAPARE - Hydrologist (Medical): No    Lack of Transportation (Non-Medical): No  Physical Activity: Inactive (07/17/2017)   Exercise Vital Sign    Days of Exercise per Week: 0 days    Minutes of Exercise per Session: 0 min  Stress: No Stress Concern Present (05/05/2020)   [place]    Feeling of Stress : Not at all  Social Connections: Unknown (05/05/2020)   Social Connection and Isolation Panel [NHANES]    Frequency of Communication with Friends and Family: Not on file    Frequency of Social Gatherings with Friends and Family: Not on file    Attends Religious Services: Not on file    Active Member of Clubs or Organizations: Not on file    Attends Archivist Meetings: Not on file    Marital Status: Married    Allergies:  Allergies  Allergen Reactions   Fentanyl Other (See Comments)    Other reaction(s): Headache, Other (See Comments) headache SEVERE HEADACHE headache   Sumatriptan Other (See Comments)    Other reaction(s): Headache Severe headache   Doxycycline Other (See Comments)    Other reaction(s): Headache, Other (See Comments) REACTION: causes severe abd pain REACTION: causes severe abd pain REACTION: causes severe abd pain   Montelukast Other (See Comments)    SEVERE HEADACHE   Oxymorphone Other (See Comments)    REACTION: severe headaches   Benzoin Rash   Buspirone Other (See Comments)    SEVERE HEADACHE   Ciprofloxacin Nausea Only   Conjugated Estrogens Other (See Comments)    headaches   Cymbalta [Duloxetine Hcl] Other (See Comments)    Headache   Duloxetine Other (See Comments)    Headache   Erythromycin Ethylsuccinate Other (See Comments)    Stomach ache   Estradiol Other (See Comments)    headache   Metronidazole Other (See Comments)    abd pain   Mirtazapine Other (See Comments)     headache    Metabolic Disorder Labs: Lab Results  Component Value Date   HGBA1C 5.4 05/28/2020   No results found for: "PROLACTIN" Lab Results  Component Value Date   CHOL 202 (H) 02/23/2021   TRIG 217.0 (H) 02/23/2021   HDL 37.50 (L) 02/23/2021   CHOLHDL 5 02/23/2021  VLDL 43.4 (H) 02/23/2021   LDLCALC 121 (H) 07/12/2016   LDLCALC 187 (H) 08/03/2015   Lab Results  Component Value Date   TSH 0.480 09/06/2020   TSH 0.590 09/04/2020    Therapeutic Level Labs: No results found for: "LITHIUM" No results found for: "VALPROATE" No results found for: "CBMZ"  Current Medications: Current Outpatient Medications  Medication Sig Dispense Refill   albuterol (PROVENTIL) (2.5 MG/3ML) 0.083% nebulizer solution Take 3 mLs (2.5 mg total) by nebulization every 4 (four) hours as needed for wheezing or shortness of breath. 360 mL 2   albuterol (VENTOLIN HFA) 108 (90 Base) MCG/ACT inhaler Inhale 2 puffs into the lungs every 4 (four) hours as needed for wheezing or shortness of breath. 1 each 11   amLODipine (NORVASC) 5 MG tablet TAKE 1 TABLET BY MOUTH EVERY DAY 90 tablet 1   ascorbic acid (VITAMIN C) 500 MG tablet Take 1 tablet (500 mg total) by mouth daily. 30 tablet 0   bisacodyl (DULCOLAX) 5 MG EC tablet Take 5 mg by mouth daily as needed for moderate constipation.     diazepam (VALIUM) 5 MG tablet Take 5 mg by mouth 3 (three) times daily as needed for anxiety.     docusate sodium (COLACE) 100 MG capsule Take 100 mg by mouth 2 (two) times daily.     esomeprazole (NEXIUM) 20 MG capsule Take 20 mg by mouth daily at 12 noon.     GRALISE 600 MG TABS Take 600 mg by mouth 3 (three) times daily.     hydrochlorothiazide (HYDRODIURIL) 25 MG tablet Take 1 tablet (25 mg total) by mouth daily. 30 tablet 1   KLOR-CON M10 10 MEQ tablet TAKE 1 TABLET BY MOUTH EVERY DAY 30 tablet 0   metoprolol tartrate (LOPRESSOR) 50 MG tablet TAKE 1/2 TABLET BY MOUTH TWICE A DAY (NEED OFFICE VISIT) 30 tablet 0    Multiple Vitamin (MULTIVITAMIN) capsule Take 1 capsule by mouth daily.     nortriptyline (PAMELOR) 50 MG capsule TAKE 1 CAPSULE BY MOUTH AT BEDTIME. 90 capsule 0   Omega-3 Fatty Acids (FISH OIL) 1200 MG CAPS Take 1 capsule by mouth daily.     ondansetron (ZOFRAN) 4 MG tablet Take 1 tablet (4 mg total) by mouth every 8 (eight) hours as needed. 40 tablet 0   Oxycodone HCl 10 MG TABS Take 10 mg by mouth 3 (three) times daily as needed (breakthrough pain).     pravastatin (PRAVACHOL) 20 MG tablet TAKE 1 TABLET BY MOUTH EVERY DAY 90 tablet 0   predniSONE (DELTASONE) 10 MG tablet Take 6 tablets x 1 day and then decrease by 1/2 tablet per day until down to zero mg. 39 tablet 0   sodium chloride (OCEAN) 0.65 % SOLN nasal spray Place 2 sprays into both nostrils daily as needed for congestion. 30 mL 2   TIROSINT 137 MCG CAPS TAKE 1 CAPSULE BY MOUTH EVERY DAY BEFORE BREAKFAST 30 capsule 2   TRELEGY ELLIPTA 100-62.5-25 MCG/ACT AEPB INHALE 1 PUFF BY MOUTH EVERY DAY 60 each 3   venlafaxine XR (EFFEXOR-XR) 75 MG 24 hr capsule Take 3 capsules (225 mg total) by mouth daily with breakfast. 270 capsule 0   XTAMPZA ER 36 MG C12A Take 36 mg by mouth every 12 (twelve) hours.  0   zinc sulfate 220 (50 Zn) MG capsule Take 1 capsule (220 mg total) by mouth daily. 30 capsule 0   zolpidem (AMBIEN) 10 MG tablet Take 10 mg by mouth at bedtime  as needed for sleep.     No current facility-administered medications for this visit.     Musculoskeletal: Strength & Muscle Tone: within normal limits Gait & Station: normal Patient leans: N/A  Psychiatric Specialty Exam: Review of Systems  There were no vitals taken for this visit.There is no height or weight on file to calculate BMI.  General Appearance: {Appearance:22683}  Eye Contact:  {BHH EYE CONTACT:22684}  Speech:  Clear and Coherent  Volume:  Normal  Mood:  {BHH MOOD:22306}  Affect:  {Affect (PAA):22687}  Thought Process:  Coherent  Orientation:  Full (Time,  Place, and Person)  Thought Content: Logical   Suicidal Thoughts:  {ST/HT (PAA):22692}  Homicidal Thoughts:  {ST/HT (PAA):22692}  Memory:  Immediate;   Good  Judgement:  {Judgement (PAA):22694}  Insight:  {Insight (PAA):22695}  Psychomotor Activity:  Normal  Concentration:  Concentration: Good and Attention Span: Good  Recall:  Good  Fund of Knowledge: Good  Language: Good  Akathisia:  No  Handed:  Right  AIMS (if indicated): not done  Assets:  Communication Skills Desire for Improvement  ADL's:  Intact  Cognition: WNL  Sleep:  {BHH GOOD/FAIR/POOR:22877}   Screenings: PHQ2-9    Flowsheet Row Video Visit from 08/26/2021 in [place] Visit from 02/23/2021 in [place] Video Visit from 11/05/2020 in [place] Office Visit from 06/05/2020 in [place] from 05/05/2020 in [place]  PHQ-2 Total Score 2 4 0 5 1  PHQ-9 Total Score 14 12 0 20 --      Flowsheet Row Video Visit from 08/26/2021 in [place] Video Visit from 01/14/2021 in [place] ED to Hosp-Admission (Discharged) from 09/03/2020 in [place] Error: Q3, 4, or 5 should not be populated when Q2 is No Low Risk No Risk        Assessment and Plan:  [name] is a 61 y.o. year old female with a history of depression, anxiety,  HTN, HL, dysphasia, asthma. migraine, sleep apnea, s/p covid pneumonia in 08/2020, who presents for follow up appointment for below.    1. MDD (major depressive disorder), recurrent episode, mild ([place]) 2. Anxiety state There has been significant improvement in depressive symptoms and anxiety since uptitration of nortriptyline.  Psychosocial stressors includes chronic pain, and loss of her mother in June 2021.  Will continue venlafaxine and  nortriptyline to target depression and anxiety.    This clinician has discussed the side effect associated with medication prescribed during this encounter. Please refer to notes in the previous encounters for more details.       Plan Continue venlafaxine 225 mg daily Continue Nortriptyline 50 mg at night - dry mouth Next appointment:  7/25 at 3:30 for 30 mins, in person - on Ambien 10 mg at night, valium 5 mg TID   The patient demonstrates the following risk factors for suicide: Chronic risk factors for suicide include: psychiatric disorder of depression. Acute risk factors for suicide include: unemployment. Protective factors for this patient include: positive social support, coping skills and hope for the future. Considering these factors, the overall suicide risk at this point appears to be low. Patient is appropriate for outpatient follow up.        Collaboration of Care: Collaboration of Care: {BH OP Collaboration of IRWE:[id number]}  Patient/Guardian was advised Release of Information must be obtained prior to  any record release in order to collaborate their care with an outside provider. Patient/Guardian was advised if they have not already done so to contact the registration department to sign all necessary forms in order for [place] to release information regarding their care.   Consent: Patient/Guardian gives verbal consent for treatment and assignment of benefits for services provided during this visit. Patient/Guardian expressed understanding and agreed to proceed.    Norman Clay, MD 05/17/2022, 1:34 PM

## 2022-05-19 ENCOUNTER — Ambulatory Visit: Payer: Medicare Other | Admitting: Psychiatry

## 2022-05-30 ENCOUNTER — Ambulatory Visit (INDEPENDENT_AMBULATORY_CARE_PROVIDER_SITE_OTHER): Payer: Medicare Other | Admitting: Internal Medicine

## 2022-05-30 ENCOUNTER — Encounter: Payer: Self-pay | Admitting: Internal Medicine

## 2022-05-30 ENCOUNTER — Ambulatory Visit (INDEPENDENT_AMBULATORY_CARE_PROVIDER_SITE_OTHER): Payer: Medicare Other

## 2022-05-30 VITALS — BP 140/88 | HR 90 | Temp 98.5°F | Resp 18 | Ht 67.0 in | Wt 205.0 lb

## 2022-05-30 DIAGNOSIS — E7800 Pure hypercholesterolemia, unspecified: Secondary | ICD-10-CM | POA: Diagnosis not present

## 2022-05-30 DIAGNOSIS — R079 Chest pain, unspecified: Secondary | ICD-10-CM

## 2022-05-30 DIAGNOSIS — F3341 Major depressive disorder, recurrent, in partial remission: Secondary | ICD-10-CM | POA: Diagnosis not present

## 2022-05-30 DIAGNOSIS — M3390 Dermatopolymyositis, unspecified, organ involvement unspecified: Secondary | ICD-10-CM | POA: Diagnosis not present

## 2022-05-30 DIAGNOSIS — J4520 Mild intermittent asthma, uncomplicated: Secondary | ICD-10-CM

## 2022-05-30 DIAGNOSIS — R1310 Dysphagia, unspecified: Secondary | ICD-10-CM

## 2022-05-30 DIAGNOSIS — N644 Mastodynia: Secondary | ICD-10-CM

## 2022-05-30 DIAGNOSIS — E039 Hypothyroidism, unspecified: Secondary | ICD-10-CM

## 2022-05-30 DIAGNOSIS — I10 Essential (primary) hypertension: Secondary | ICD-10-CM | POA: Diagnosis not present

## 2022-05-30 DIAGNOSIS — Z1231 Encounter for screening mammogram for malignant neoplasm of breast: Secondary | ICD-10-CM

## 2022-05-30 DIAGNOSIS — J45901 Unspecified asthma with (acute) exacerbation: Secondary | ICD-10-CM | POA: Diagnosis not present

## 2022-05-30 DIAGNOSIS — K219 Gastro-esophageal reflux disease without esophagitis: Secondary | ICD-10-CM | POA: Diagnosis not present

## 2022-05-30 DIAGNOSIS — K52831 Collagenous colitis: Secondary | ICD-10-CM | POA: Diagnosis not present

## 2022-05-30 DIAGNOSIS — L989 Disorder of the skin and subcutaneous tissue, unspecified: Secondary | ICD-10-CM

## 2022-05-30 DIAGNOSIS — Z86010 Personal history of colonic polyps: Secondary | ICD-10-CM

## 2022-05-30 MED ORDER — PREDNISONE 10 MG PO TABS: ORAL_TABLET | ORAL | 0 refills | Status: DC

## 2022-05-30 MED ORDER — MUPIROCIN 2 % EX OINT: 1.0000 | TOPICAL_OINTMENT | Freq: Two times a day (BID) | CUTANEOUS | 0 refills | Status: DC

## 2022-05-30 MED ORDER — PANTOPRAZOLE SODIUM 40 MG PO TBEC: 40.0000 mg | DELAYED_RELEASE_TABLET | Freq: Every day | ORAL | 3 refills | Status: DC

## 2022-05-30 MED ORDER — TRELEGY ELLIPTA 100-62.5-25 MCG/ACT IN AEPB: INHALATION_SPRAY | RESPIRATORY_TRACT | 1 refills | Status: DC

## 2022-05-30 MED ORDER — ALBUTEROL SULFATE HFA 108 (90 BASE) MCG/ACT IN AERS: 2.0000 | INHALATION_SPRAY | Freq: Four times a day (QID) | RESPIRATORY_TRACT | 2 refills | Status: DC | PRN

## 2022-05-30 NOTE — Progress Notes (Addendum)
Patient ID: [name], female   DOB: 1960/12/07, 61 y.o.   MRN: [id number]   Subjective:    Patient ID: [name], female    DOB: Mar 02, 1961, 61 y.o.   MRN: [id number]   Patient here for  Chief Complaint  Patient presents with   Follow-up    F/u and pt would like to discuss chest pain/ sinuses, and wheezing    .   HPI Here to follow up regarding her blood pressure, cholesterol and asthma.  Reports has not been using trelegy.  Has has increased wheezing.  Started back last few days.  No fever.  Increased acid reflux.  Some regurgitation of food.  Food "hangs up".  Taking some otc prilosec.  Also has noticed some sharp chest pain - under left breast.  If rushes - notices sharp pain.  Some discomfort when taking deep breath.  Movement aggravates.  May last 30-45 minutes.  Has been out of the metoprolol for one week.  Has noticed pain - right breast 4:00.  Overdue mammogram.  Lesions - arm.  Discussed bactroban.     Past Medical History:  Diagnosis Date   Allergic rhinitis    Anxiety    Arthralgia    Asthma    Bronchitis, chronic (HCC)    Chronic back pain    Collagenous colitis    followed by Dr Tiffany Kocher   COVID-19    09/03/20 with pneumonia   Depression    Diplopia 11/15/2013   Dysphagia    Endometriosis    Fibrocystic breast disease    GERD (gastroesophageal reflux disease)    History of colon polyps    Hyperlipidemia    Hypertension    Hypokalemia    IBS (irritable bowel syndrome)    Memory difficulty 11/15/2013   Migraine headache    Pneumonia 2017   x2   PONV (postoperative nausea and vomiting)    used patch   Sleep apnea    recommended CPAP   Ulcer disease    Urine incontinence    Past Surgical History:  Procedure Laterality Date   ABDOMINAL HYSTERECTOMY     endometriosis   BACK SURGERY     x6   BREAST BIOPSY Right    COLONOSCOPY WITH PROPOFOL N/A 06/25/2015   Procedure: COLONOSCOPY WITH PROPOFOL;  Surgeon: Manya Silvas, MD;  Location: [facility];  Service: Endoscopy;  Laterality: N/A;   DILATION AND CURETTAGE OF UTERUS     and hysteroscopy   ESOPHAGOGASTRODUODENOSCOPY N/A 06/25/2015   Procedure: ESOPHAGOGASTRODUODENOSCOPY (EGD);  Surgeon: Manya Silvas, MD;  Location: [facility];  Service: Endoscopy;  Laterality: N/A;   insertion of permanent spinal cord stimulator     insertion of trial spinal cord stimulator     LUMBAR DISC SURGERY     LUMBAR FUSION  4/05 and 8/07   LUMBAR MICRODISCECTOMY  03/28/03   L4-5 L5   MOUTH SURGERY     NASAL SEPTOPLASTY W/ TURBINOPLASTY     removal of spinal cord stimulator     SEPTOPLASTY     with reduction of turbinates   THYROIDECTOMY N/A 06/06/2016   Procedure: THYROIDECTOMY;  Surgeon: Robert Bellow, MD;  Location: [facility];  Service: General;  Laterality: N/A;   Family History  Problem Relation Age of Onset   Allergies Mother    Hypertension Mother    Arthritis Mother    Hyperlipidemia Mother    Stroke Mother    Hypertension Father    Arthritis Father  Diabetes Father    Hypertension Brother    Diabetes Brother    Arthritis Maternal Grandmother    Hyperlipidemia Maternal Grandmother    Hyperlipidemia Maternal Grandfather    Arthritis Paternal Grandmother    Arthritis Paternal Grandfather    Asthma Other        several family member   Leukemia Other        uncle   Lymphoma Maternal Aunt        non hodgkins   Social History   Socioeconomic History   Marital status: Married    Spouse name: douglas   Number of children: 1   Years of education: 31 th   Highest education level: High school graduate  Occupational History   Occupation: Disabled    Employer: DISABLED  Tobacco Use   Smoking status: Never   Smokeless tobacco: Never  Vaping Use   Vaping Use: Never used  Substance and Sexual Activity   Alcohol use: No    Alcohol/week: 0.0 standard drinks of alcohol   Drug use: No   Sexual activity: Not Currently  Other Topics Concern   Not on file   Social History Narrative   ** Merged History Encounter **       Social Determinants of Health   Financial Resource Strain: Low Risk  (05/05/2020)   Overall Financial Resource Strain (CARDIA)    Difficulty of Paying Living Expenses: Not hard at all  Food Insecurity: No Food Insecurity (05/05/2020)   Hunger Vital Sign    Worried About Running Out of Food in the Last Year: Never true    [place] in the Last Year: Never true  Transportation Needs: No Transportation Needs (05/05/2020)   PRAPARE - Hydrologist (Medical): No    Lack of Transportation (Non-Medical): No  Physical Activity: Inactive (07/17/2017)   Exercise Vital Sign    Days of Exercise per Week: 0 days    Minutes of Exercise per Session: 0 min  Stress: No Stress Concern Present (05/05/2020)   [place]    Feeling of Stress : Not at all  Social Connections: Unknown (05/05/2020)   Social Connection and Isolation Panel [NHANES]    Frequency of Communication with Friends and Family: Not on file    Frequency of Social Gatherings with Friends and Family: Not on file    Attends Religious Services: Not on file    Active Member of Clubs or Organizations: Not on file    Attends Archivist Meetings: Not on file    Marital Status: Married     Review of Systems  Constitutional:  Negative for appetite change and unexpected weight change.  HENT:  Negative for congestion and sinus pressure.   Respiratory:  Positive for shortness of breath. Negative for cough.   Cardiovascular:  Positive for chest pain. Negative for palpitations and leg swelling.  Gastrointestinal:  Negative for nausea and vomiting.       Increased acid reflux.    Genitourinary:  Negative for difficulty urinating and dysuria.  Musculoskeletal:  Negative for joint swelling.  Skin:  Negative for color change and rash.       Skin lesions - arm  Neurological:   Negative for dizziness and headaches.  Psychiatric/Behavioral:  Negative for agitation and dysphoric mood.        Objective:     BP (!) 140/88 (BP Location: Left Arm, Patient Position: Sitting, Cuff Size: Normal)  Pulse 90   Temp 98.5 F (36.9 C) (Oral)   Resp 18   Ht '5\' 7"'$  (1.702 m)   Wt 205 lb (93 kg)   SpO2 97%   BMI 32.11 kg/m  Wt Readings from Last 3 Encounters:  05/30/22 205 lb (93 kg)  02/23/21 200 lb 9.6 oz (91 kg)  12/10/20 199 lb (90.3 kg)    Physical Exam Vitals reviewed.  Constitutional:      General: She is not in acute distress.    Appearance: Normal appearance.  HENT:     Head: Normocephalic and atraumatic.     Right Ear: External ear normal.     Left Ear: External ear normal.     Mouth/Throat:     Mouth: Mucous membranes are moist.     Pharynx: Oropharynx is clear. No posterior oropharyngeal erythema.  Eyes:     General: No scleral icterus.       Right eye: No discharge.        Left eye: No discharge.     Conjunctiva/sclera: Conjunctivae normal.  Neck:     Thyroid: No thyromegaly.  Cardiovascular:     Rate and Rhythm: Normal rate and regular rhythm.  Pulmonary:     Effort: No respiratory distress.     Breath sounds: Normal breath sounds.  Abdominal:     General: Bowel sounds are normal.     Palpations: Abdomen is soft.     Tenderness: There is no abdominal tenderness.  Musculoskeletal:        General: No swelling or tenderness.     Cervical back: Neck supple. No tenderness.  Lymphadenopathy:     Cervical: No cervical adenopathy.  Skin:    Findings: No erythema or rash.     Comments: Lesions - arm.   Neurological:     Mental Status: She is alert.  Psychiatric:        Mood and Affect: Mood normal.        Behavior: Behavior normal.      Outpatient Encounter Medications as of 05/30/2022  Medication Sig   amLODipine (NORVASC) 5 MG tablet TAKE 1 TABLET BY MOUTH EVERY DAY   ascorbic acid (VITAMIN C) 500 MG tablet Take 1 tablet (500 mg  total) by mouth daily.   bisacodyl (DULCOLAX) 5 MG EC tablet Take 5 mg by mouth daily as needed for moderate constipation.   diazepam (VALIUM) 5 MG tablet Take 5 mg by mouth 3 (three) times daily as needed for anxiety.   docusate sodium (COLACE) 100 MG capsule Take 100 mg by mouth 2 (two) times daily.   GRALISE 600 MG TABS Take 600 mg by mouth 3 (three) times daily.   hydrochlorothiazide (HYDRODIURIL) 25 MG tablet Take 1 tablet (25 mg total) by mouth daily.   metoprolol tartrate (LOPRESSOR) 50 MG tablet TAKE 1/2 TABLET BY MOUTH TWICE A DAY (NEED OFFICE VISIT)   Multiple Vitamin (MULTIVITAMIN) capsule Take 1 capsule by mouth daily.   mupirocin ointment (BACTROBAN) 2 % Apply 1 Application topically 2 (two) times daily.   nortriptyline (PAMELOR) 50 MG capsule TAKE 1 CAPSULE BY MOUTH AT BEDTIME.   Omega-3 Fatty Acids (FISH OIL) 1200 MG CAPS Take 1 capsule by mouth daily.   ondansetron (ZOFRAN) 4 MG tablet Take 1 tablet (4 mg total) by mouth every 8 (eight) hours as needed.   Oxycodone HCl 10 MG TABS Take 10 mg by mouth 3 (three) times daily as needed (breakthrough pain).   pantoprazole (PROTONIX) 40 MG tablet  Take 1 tablet (40 mg total) by mouth daily. Take 30 minutes before breakfast   pravastatin (PRAVACHOL) 20 MG tablet TAKE 1 TABLET BY MOUTH EVERY DAY   predniSONE (DELTASONE) 10 MG tablet Take 4 tablets x 1 day then decrease by 1/2 tablet per day until down to zero mg.   sodium chloride (OCEAN) 0.65 % SOLN nasal spray Place 2 sprays into both nostrils daily as needed for congestion.   TIROSINT 137 MCG CAPS TAKE 1 CAPSULE BY MOUTH EVERY DAY BEFORE BREAKFAST   XTAMPZA ER 36 MG C12A Take 36 mg by mouth every 12 (twelve) hours.   zinc sulfate 220 (50 Zn) MG capsule Take 1 capsule (220 mg total) by mouth daily.   zolpidem (AMBIEN) 10 MG tablet Take 10 mg by mouth at bedtime as needed for sleep.   [DISCONTINUED] albuterol (PROVENTIL) (2.5 MG/3ML) 0.083% nebulizer solution Take 3 mLs (2.5 mg total) by  nebulization every 4 (four) hours as needed for wheezing or shortness of breath.   [DISCONTINUED] albuterol (VENTOLIN HFA) 108 (90 Base) MCG/ACT inhaler Inhale 2 puffs into the lungs every 4 (four) hours as needed for wheezing or shortness of breath.   [DISCONTINUED] esomeprazole (NEXIUM) 20 MG capsule Take 20 mg by mouth daily at 12 noon.   [DISCONTINUED] KLOR-CON M10 10 MEQ tablet TAKE 1 TABLET BY MOUTH EVERY DAY   [DISCONTINUED] predniSONE (DELTASONE) 10 MG tablet Take 6 tablets x 1 day and then decrease by 1/2 tablet per day until down to zero mg.   [DISCONTINUED] TRELEGY ELLIPTA 100-62.5-25 MCG/ACT AEPB INHALE 1 PUFF BY MOUTH EVERY DAY   albuterol (VENTOLIN HFA) 108 (90 Base) MCG/ACT inhaler Inhale 2 puffs into the lungs every 6 (six) hours as needed for wheezing or shortness of breath.   Fluticasone-Umeclidin-Vilant (TRELEGY ELLIPTA) 100-62.5-25 MCG/ACT AEPB INHALE 1 PUFF BY MOUTH EVERY DAY   potassium chloride (KLOR-CON M10) 10 MEQ tablet Take one tablet bid x 2 days and then continue one po q day   venlafaxine XR (EFFEXOR-XR) 75 MG 24 hr capsule Take 3 capsules (225 mg total) by mouth daily with breakfast.   [DISCONTINUED] triamcinolone (NASACORT AQ) 55 MCG/ACT AERO nasal inhaler Place 2 sprays into the nose 2 (two) times daily. 1 sprays each nostril twice a day   No facility-administered encounter medications on file as of 05/30/2022.     Lab Results  Component Value Date   WBC 6.8 05/30/2022   HGB 13.8 05/30/2022   HCT 40.9 05/30/2022   PLT 267.0 05/30/2022   GLUCOSE 90 05/30/2022   CHOL 181 05/30/2022   TRIG 211.0 (H) 05/30/2022   HDL 43.10 05/30/2022   LDLDIRECT 108.0 05/30/2022   LDLCALC 121 (H) 07/12/2016   ALT 15 05/30/2022   AST 19 05/30/2022   NA 138 05/30/2022   K 3.1 (L) 05/30/2022   CL 96 05/30/2022   CREATININE 1.05 05/30/2022   BUN 15 05/30/2022   CO2 33 (H) 05/30/2022   TSH 1.54 05/30/2022   HGBA1C 5.4 05/28/2020    DG Chest 2 View  Result Date:  11/09/2021 CLINICAL DATA:  Follow-up abnormal chest x-ray. EXAM: CHEST - 2 VIEW COMPARISON:  Chest radiograph dated 11/08/2021. FINDINGS: No focal consolidation, pleural effusion or pneumothorax. The cardiac silhouette is within limits. No acute osseous pathology. Midthoracic spinal stimulator. IMPRESSION: No active cardiopulmonary disease. Electronically Signed   By: Anner Crete M.D.   On: 11/09/2021 21:45   DG Shoulder Left  Result Date: 11/09/2021 CLINICAL DATA:  [place] 2 weeks  ago.  Pain. EXAM: LEFT SHOULDER - 2+ VIEW COMPARISON:  None. FINDINGS: Mild acromioclavicular joint space narrowing with minimal peripheral degenerative osteophytes. Moderate glenohumeral joint space narrowing. There is mild-to-moderate medial humeral head likely chronic/degenerative cortical irregularity. No acute fracture is seen. No dislocation. A spinal cord stimulator electrode overlies the thoracic spine. IMPRESSION: Moderate glenohumeral osteoarthritis. Electronically Signed   By: Yvonne Kendall M.D.   On: 11/09/2021 18:34       Assessment & Plan:   Problem List Items Addressed This Visit     Arm lesion    bactroban as directed.  Follow       Asthma    Has noticed increased wheezing.  Has not been using trelegy.  (out).  Just recently started back - last few days.  Discussed the need to use regularly.  Albuterol prn.  Prednisone taper as directed.  Check cxr.  Follow.       Relevant Medications   Fluticasone-Umeclidin-Vilant (TRELEGY ELLIPTA) 100-62.5-25 MCG/ACT AEPB   albuterol (VENTOLIN HFA) 108 (90 Base) MCG/ACT inhaler   predniSONE (DELTASONE) 10 MG tablet   Breast cancer screening   Breast pain    Breast pain- 4:00 right breast and due bilateral mammogram.  Schedule diagnostic mammogram for further evaluation.        Relevant Orders   MM DIAG BREAST TOMO BILATERAL   US BREAST LTD UNI RIGHT INC AXILLA   Chest pain    Describes the chest pain as outlined.  Also sob.  Treat asthma flare as  outlined.  Prednisone.  Restart trelegy.  Has albuterol inhaler if needed.  Check cxr. EKG - SR - no acute ischemic changes.  Discussed given symptoms and risk factors, need for cardiac evaluation.  Treat acid reflux.       Relevant Orders   EKG 12-Lead (Completed)   DG Chest 2 View (Completed)   Ambulatory referral to Cardiology   Collagenous colitis    Schedule f/u with GI      Dermatomyositis ([place])    Previously diagnosed with dermatomyositis.  Evaluated by rheumatology.        Dysphagia    Reports dysphagia.  Discussed taking small bites and chewing food well.  Refer to GI for question of need for EGD.       Relevant Orders   Ambulatory referral to Gastroenterology   Essential hypertension, benign - Primary    Continue amlodipine, hctz and metoprolol.  Blood pressure on recheck 130/78.   Follow pressures.  Follow metabolic panel.       Relevant Orders   Basic Metabolic Panel (BMET) (Completed)   GERD    Worsening acid reflux.  Regurgitation as outlined.  Probably worsening breathing.  protonix in the am and pepcid 30 minutes before evening meal.  Describes dysphagia.  Discussed the need for GI evaluation and question of need for EGD.       Relevant Medications   pantoprazole (PROTONIX) 40 MG tablet   Other Relevant Orders   Ambulatory referral to Gastroenterology   History of colonic polyps    Colonoscopy 06/2015 - tubular adenoma x 2.  Recommended f/u in 06/2020.  overdue.  She request referral to [facility].  Did not keep appt.  Will reschedule.       Relevant Orders   Ambulatory referral to Gastroenterology   Hypercholesterolemia    Low cholesterol diet and exercise.  Pravastatin.  Follow lipid panel and liver function tests.        Relevant Orders  Lipid Profile (Completed)   Hepatic function panel (Completed)   CBC with Differential/Platelet (Completed)   TSH (Completed)   LDL cholesterol, direct (Completed)   Hypothyroidism    On tirosant.  Follow tsh.        MDD (major depressive disorder), recurrent, in partial remission ([place])    Has been followed by psychiatry.  Stable.  Continues on effexor.       Other Visit Diagnoses     Mild asthma with exacerbation, unspecified whether persistent       Relevant Medications   Fluticasone-Umeclidin-Vilant (TRELEGY ELLIPTA) 100-62.5-25 MCG/ACT AEPB   albuterol (VENTOLIN HFA) 108 (90 Base) MCG/ACT inhaler   predniSONE (DELTASONE) 10 MG tablet      I spent 45 minutes with the patient. Time spent discussing her current concerns and symptoms. Specifically time spent discussed her chest pain, sob, importance of taking medication as directed and keeping f/u appts.  Time also spent discussing further w/up, evaluation and treatment.    Einar Pheasant, MD

## 2022-05-30 NOTE — Patient Instructions (Signed)
Take protonix 30 minutes before breakfast  Take pepcid 30 minutes before evening meal.

## 2022-05-31 ENCOUNTER — Telehealth: Payer: Self-pay

## 2022-05-31 LAB — BASIC METABOLIC PANEL
BUN: 15 mg/dL (ref 6–23)
CO2: 33 mEq/L — ABNORMAL HIGH (ref 19–32)
Calcium: 9 mg/dL (ref 8.4–10.5)
Chloride: 96 mEq/L (ref 96–112)
Creatinine, Ser: 1.05 mg/dL (ref 0.40–1.20)
GFR: 56.65 mL/min — ABNORMAL LOW (ref >60.00)
Glucose, Bld: 90 mg/dL (ref 70–99)
Potassium: 3.1 mEq/L — ABNORMAL LOW (ref 3.5–5.1)
Sodium: 138 mEq/L (ref 135–145)

## 2022-05-31 LAB — CBC WITH DIFFERENTIAL/PLATELET
Basophils Absolute: 0 10*3/uL (ref 0.0–0.1)
Basophils Relative: 0.4 % (ref 0.0–3.0)
Eosinophils Absolute: 0.6 10*3/uL (ref 0.0–0.7)
Eosinophils Relative: 8.6 % — ABNORMAL HIGH (ref 0.0–5.0)
HCT: 40.9 % (ref 36.0–46.0)
Hemoglobin: 13.8 g/dL (ref 12.0–15.0)
Lymphocytes Relative: 20.2 % (ref 12.0–46.0)
Lymphs Abs: 1.4 10*3/uL (ref 0.7–4.0)
MCHC: 33.8 g/dL (ref 30.0–36.0)
MCV: 89.1 fl (ref 78.0–100.0)
Monocytes Absolute: 0.4 10*3/uL (ref 0.1–1.0)
Monocytes Relative: 6 % (ref 3.0–12.0)
Neutro Abs: 4.4 10*3/uL (ref 1.4–7.7)
Neutrophils Relative %: 64.8 % (ref 43.0–77.0)
Platelets: 267 10*3/uL (ref 150.0–400.0)
RBC: 4.59 Mil/uL (ref 3.87–5.11)
RDW: 13.2 % (ref 11.5–15.5)
WBC: 6.8 10*3/uL (ref 4.0–10.5)

## 2022-05-31 LAB — TSH: TSH: 1.54 u[IU]/mL (ref 0.35–5.50)

## 2022-05-31 LAB — HEPATIC FUNCTION PANEL
ALT: 15 U/L (ref 0–35)
AST: 19 U/L (ref 0–37)
Albumin: 4.2 g/dL (ref 3.5–5.2)
Alkaline Phosphatase: 67 U/L (ref 39–117)
Bilirubin, Direct: 0.1 mg/dL (ref 0.0–0.3)
Total Bilirubin: 0.6 mg/dL (ref 0.2–1.2)
Total Protein: 6.7 g/dL (ref 6.0–8.3)

## 2022-05-31 LAB — LIPID PANEL
Cholesterol: 181 mg/dL (ref 0–200)
HDL: 43.1 mg/dL (ref >39.00)
NonHDL: 137.91
Total CHOL/HDL Ratio: 4
Triglycerides: 211 mg/dL — ABNORMAL HIGH (ref 0.0–149.0)
VLDL: 42.2 mg/dL — ABNORMAL HIGH (ref 0.0–40.0)

## 2022-05-31 LAB — LDL CHOLESTEROL, DIRECT: Direct LDL: 108 mg/dL

## 2022-05-31 NOTE — Telephone Encounter (Signed)
LMOM for pt to CB in regards to labs 

## 2022-06-01 MED ORDER — POTASSIUM CHLORIDE CRYS ER 10 MEQ PO TBCR: EXTENDED_RELEASE_TABLET | ORAL | 1 refills | Status: DC

## 2022-06-01 NOTE — Telephone Encounter (Signed)
Patient is returning Gracy Racer, CMA's call regarding her lab results.

## 2022-06-02 ENCOUNTER — Telehealth: Payer: Self-pay | Admitting: Internal Medicine

## 2022-06-02 ENCOUNTER — Telehealth: Payer: Self-pay

## 2022-06-02 DIAGNOSIS — G894 Chronic pain syndrome: Secondary | ICD-10-CM | POA: Diagnosis not present

## 2022-06-02 DIAGNOSIS — M62830 Muscle spasm of back: Secondary | ICD-10-CM | POA: Diagnosis not present

## 2022-06-02 DIAGNOSIS — M47817 Spondylosis without myelopathy or radiculopathy, lumbosacral region: Secondary | ICD-10-CM | POA: Diagnosis not present

## 2022-06-02 DIAGNOSIS — G4700 Insomnia, unspecified: Secondary | ICD-10-CM | POA: Diagnosis not present

## 2022-06-02 DIAGNOSIS — E876 Hypokalemia: Secondary | ICD-10-CM

## 2022-06-02 NOTE — Telephone Encounter (Signed)
Patient has a lab appt 06/07/2022, there are no orders in.

## 2022-06-02 NOTE — Telephone Encounter (Signed)
LMTCB for results. 

## 2022-06-03 NOTE — Addendum Note (Signed)
Addended by: Alisa Graff on: 06/03/2022 03:52 AM   Modules accepted: Orders

## 2022-06-03 NOTE — Telephone Encounter (Signed)
LMTCB

## 2022-06-03 NOTE — Telephone Encounter (Signed)
Order placed for f/u potassium check.  

## 2022-06-03 NOTE — Telephone Encounter (Signed)
Patient returned office phone call for results 

## 2022-06-04 ENCOUNTER — Other Ambulatory Visit: Payer: Self-pay | Admitting: Internal Medicine

## 2022-06-04 ENCOUNTER — Other Ambulatory Visit: Payer: Self-pay | Admitting: Family

## 2022-06-04 ENCOUNTER — Encounter: Payer: Self-pay | Admitting: Internal Medicine

## 2022-06-04 DIAGNOSIS — L989 Disorder of the skin and subcutaneous tissue, unspecified: Secondary | ICD-10-CM | POA: Insufficient documentation

## 2022-06-04 NOTE — Assessment & Plan Note (Signed)
Worsening acid reflux.  Regurgitation as outlined.  Probably worsening breathing.  protonix in the am and pepcid 30 minutes before evening meal.  Describes dysphagia.  Discussed the need for GI evaluation and question of need for EGD.

## 2022-06-04 NOTE — Assessment & Plan Note (Signed)
Low cholesterol diet and exercise.  Pravastatin.  Follow lipid panel and liver function tests.

## 2022-06-04 NOTE — Assessment & Plan Note (Signed)
bactroban as directed.  Follow

## 2022-06-04 NOTE — Assessment & Plan Note (Signed)
Previously diagnosed with dermatomyositis.  Evaluated by rheumatology.

## 2022-06-04 NOTE — Assessment & Plan Note (Signed)
Breast pain- 4:00 right breast and due bilateral mammogram.  Schedule diagnostic mammogram for further evaluation.

## 2022-06-04 NOTE — Assessment & Plan Note (Signed)
Has noticed increased wheezing.  Has not been using trelegy.  (out).  Just recently started back - last few days.  Discussed the need to use regularly.  Albuterol prn.  Prednisone taper as directed.  Check cxr.  Follow.

## 2022-06-04 NOTE — Assessment & Plan Note (Signed)
Continue amlodipine, hctz and metoprolol.  Blood pressure on recheck 130/78.   Follow pressures.  Follow metabolic panel.

## 2022-06-04 NOTE — Assessment & Plan Note (Signed)
Reports dysphagia.  Discussed taking small bites and chewing food well.  Refer to GI for question of need for EGD.

## 2022-06-04 NOTE — Assessment & Plan Note (Signed)
Describes the chest pain as outlined.  Also sob.  Treat asthma flare as outlined.  Prednisone.  Restart trelegy.  Has albuterol inhaler if needed.  Check cxr. EKG - SR - no acute ischemic changes.  Discussed given symptoms and risk factors, need for cardiac evaluation.  Treat acid reflux.

## 2022-06-04 NOTE — Assessment & Plan Note (Signed)
On tirosant.  Follow tsh.  

## 2022-06-04 NOTE — Assessment & Plan Note (Signed)
Schedule f/u with GI

## 2022-06-04 NOTE — Assessment & Plan Note (Addendum)
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  Recommended f/u in 06/2020.  overdue.  She request referral to [place].  Did not keep appt.  Will reschedule.

## 2022-06-04 NOTE — Assessment & Plan Note (Signed)
Has been followed by psychiatry.  Stable.  Continues on effexor.

## 2022-06-06 ENCOUNTER — Telehealth: Payer: Self-pay | Admitting: Internal Medicine

## 2022-06-06 ENCOUNTER — Telehealth: Payer: Self-pay

## 2022-06-06 NOTE — Telephone Encounter (Signed)
Lft pt vm to call to [place] to sch with [place] at [phone]. thanks

## 2022-06-06 NOTE — Telephone Encounter (Signed)
Lvm for pt to return call in regards to her K rx  Per Dr.Scott: I want her to take the prescription potassium as directed - one tablet bid x 2 days and then continue one po q day with recheck potassium as scheduled.  I have sent in rx for potassium

## 2022-06-06 NOTE — Telephone Encounter (Signed)
Patient states she is returning our call.  I read message to patient from Dr. Einar Pheasant.  Patient states last time she saw Dr. Nicki Reaper she was having UTI symptoms and Dr. Nicki Reaper was going to get a urine sample at the same time she was getting her blood drawn.  Patient states she did not get it that day.  I checked the system and we do not have an order for her urine sample.  Patient states she would like to have a urine sample on 06/13/2022 when she has her potassium checked.  I let patient know that I will send this message to Dr. Nicki Reaper so she can add the order in the system.

## 2022-06-07 ENCOUNTER — Telehealth: Payer: Self-pay

## 2022-06-07 ENCOUNTER — Other Ambulatory Visit: Payer: Medicare Other

## 2022-06-07 NOTE — Telephone Encounter (Signed)
LMOM to CB in regards to xray results and also stated ion VM that Dr. Nicki Reaper ddi send in a Rx for potassium.

## 2022-06-07 NOTE — Telephone Encounter (Signed)
Please confirm what symptoms she is having and ok to order urinalysis and urine culture, but if actively having symptoms may not need to wait until 06/13/22.

## 2022-06-08 ENCOUNTER — Telehealth: Payer: Self-pay

## 2022-06-08 NOTE — Telephone Encounter (Signed)
LMOM to CB. 

## 2022-06-08 NOTE — Telephone Encounter (Signed)
LMOM for pt to CB in regards to what Dr. Nicki Reaper wanted to know before ordering a urine culture.   Einar Pheasant, MD  Denita Lung A, CMA 15 hours ago (6:27 PM)    Please confirm what symptoms she is having and ok to order urinalysis and urine culture, but if actively having symptoms may not need to wait until 06/13/22.        Note    You  Einar Pheasant, MD 18 hours ago (3:59 PM)    Pt wanted a urine sample added to her labs for the day orf her lab orders because she stated she spoke to you about a uti at her appt.     Walker Shadow P routed conversation to Zacarias Pontes, CMA 2 days ago   Cyndi Lennert 2 days ago    Patient states she is returning our call.  I read message to patient from Dr. Einar Pheasant.   Patient states last time she saw Dr. Nicki Reaper she was having UTI symptoms and Dr. Nicki Reaper was going to get a urine sample at the same time she was getting her blood drawn.  Patient states she did not get it that day.  I checked the system and we do not have an order for her urine sample.  Patient states she would like to have a urine sample on 06/13/2022 when she has her potassium checked.  I let patient know that I will send this message to Dr. Nicki Reaper so she can add the order in the system.      Note

## 2022-06-09 NOTE — Telephone Encounter (Signed)
2nd attempt at trying to contact pt LMOM to [facility]

## 2022-06-12 ENCOUNTER — Encounter: Payer: Self-pay | Admitting: *Deleted

## 2022-06-13 ENCOUNTER — Other Ambulatory Visit: Payer: Medicare Other

## 2022-06-15 ENCOUNTER — Other Ambulatory Visit: Payer: Medicare Other

## 2022-06-22 ENCOUNTER — Telehealth: Payer: Self-pay | Admitting: Internal Medicine

## 2022-06-22 ENCOUNTER — Other Ambulatory Visit: Payer: Self-pay

## 2022-06-22 NOTE — Telephone Encounter (Signed)
I spoke with pt husband he stated that he called the pharmacy for her medication of Tirosint TIROSINT 137 MCG CAPS and was informed that they needed a new refill rx. Refill request was sent this morning.   Pharmacy is  [id number]  Call pt at [phone] Thank you!

## 2022-06-22 NOTE — Telephone Encounter (Signed)
sent 

## 2022-06-23 ENCOUNTER — Other Ambulatory Visit: Payer: Medicare Other

## 2022-06-26 ENCOUNTER — Other Ambulatory Visit: Payer: Self-pay | Admitting: Internal Medicine

## 2022-06-27 ENCOUNTER — Other Ambulatory Visit: Payer: Medicare Other

## 2022-06-28 ENCOUNTER — Other Ambulatory Visit: Payer: Self-pay | Admitting: Internal Medicine

## 2022-07-11 DIAGNOSIS — M62830 Muscle spasm of back: Secondary | ICD-10-CM | POA: Diagnosis not present

## 2022-07-11 DIAGNOSIS — G4700 Insomnia, unspecified: Secondary | ICD-10-CM | POA: Diagnosis not present

## 2022-07-11 DIAGNOSIS — G894 Chronic pain syndrome: Secondary | ICD-10-CM | POA: Diagnosis not present

## 2022-07-11 DIAGNOSIS — M47817 Spondylosis without myelopathy or radiculopathy, lumbosacral region: Secondary | ICD-10-CM | POA: Diagnosis not present

## 2022-07-12 ENCOUNTER — Other Ambulatory Visit: Payer: Self-pay | Admitting: Psychiatry

## 2022-07-12 DIAGNOSIS — F330 Major depressive disorder, recurrent, mild: Secondary | ICD-10-CM

## 2022-07-12 DIAGNOSIS — F411 Generalized anxiety disorder: Secondary | ICD-10-CM

## 2022-07-20 ENCOUNTER — Other Ambulatory Visit: Payer: Self-pay | Admitting: Internal Medicine

## 2022-07-20 DIAGNOSIS — N644 Mastodynia: Secondary | ICD-10-CM

## 2022-07-25 ENCOUNTER — Other Ambulatory Visit: Payer: Self-pay | Admitting: Internal Medicine

## 2022-08-07 ENCOUNTER — Other Ambulatory Visit: Payer: Self-pay | Admitting: Internal Medicine

## 2022-08-09 NOTE — Telephone Encounter (Signed)
L/M FOR PT. TO C/B.

## 2022-08-12 NOTE — Telephone Encounter (Signed)
LM FOR PT TO CB X 2

## 2022-08-18 DIAGNOSIS — G4700 Insomnia, unspecified: Secondary | ICD-10-CM | POA: Diagnosis not present

## 2022-08-18 DIAGNOSIS — G894 Chronic pain syndrome: Secondary | ICD-10-CM | POA: Diagnosis not present

## 2022-08-18 DIAGNOSIS — M62830 Muscle spasm of back: Secondary | ICD-10-CM | POA: Diagnosis not present

## 2022-08-18 DIAGNOSIS — M47817 Spondylosis without myelopathy or radiculopathy, lumbosacral region: Secondary | ICD-10-CM | POA: Diagnosis not present

## 2022-08-29 ENCOUNTER — Other Ambulatory Visit: Payer: Self-pay | Admitting: Internal Medicine

## 2022-08-29 DIAGNOSIS — J45901 Unspecified asthma with (acute) exacerbation: Secondary | ICD-10-CM

## 2022-08-30 NOTE — Telephone Encounter (Signed)
Rx ok'd for albuterol inhaler.

## 2022-09-09 ENCOUNTER — Other Ambulatory Visit: Payer: Self-pay | Admitting: Internal Medicine

## 2022-09-09 ENCOUNTER — Other Ambulatory Visit: Payer: Self-pay | Admitting: Psychiatry

## 2022-09-09 DIAGNOSIS — F330 Major depressive disorder, recurrent, mild: Secondary | ICD-10-CM

## 2022-09-09 DIAGNOSIS — F411 Generalized anxiety disorder: Secondary | ICD-10-CM

## 2022-09-19 DIAGNOSIS — M62830 Muscle spasm of back: Secondary | ICD-10-CM | POA: Diagnosis not present

## 2022-09-19 DIAGNOSIS — G4700 Insomnia, unspecified: Secondary | ICD-10-CM | POA: Diagnosis not present

## 2022-09-19 DIAGNOSIS — G894 Chronic pain syndrome: Secondary | ICD-10-CM | POA: Diagnosis not present

## 2022-09-19 DIAGNOSIS — M47817 Spondylosis without myelopathy or radiculopathy, lumbosacral region: Secondary | ICD-10-CM | POA: Diagnosis not present

## 2022-09-19 NOTE — Telephone Encounter (Signed)
LM FOR PT TO CB - rx denied

## 2022-10-17 DIAGNOSIS — M47817 Spondylosis without myelopathy or radiculopathy, lumbosacral region: Secondary | ICD-10-CM | POA: Diagnosis not present

## 2022-10-17 DIAGNOSIS — M62830 Muscle spasm of back: Secondary | ICD-10-CM | POA: Diagnosis not present

## 2022-10-17 DIAGNOSIS — G894 Chronic pain syndrome: Secondary | ICD-10-CM | POA: Diagnosis not present

## 2022-10-17 DIAGNOSIS — Z79891 Long term (current) use of opiate analgesic: Secondary | ICD-10-CM | POA: Diagnosis not present

## 2022-10-17 DIAGNOSIS — G4700 Insomnia, unspecified: Secondary | ICD-10-CM | POA: Diagnosis not present

## 2022-11-14 DIAGNOSIS — M47817 Spondylosis without myelopathy or radiculopathy, lumbosacral region: Secondary | ICD-10-CM | POA: Diagnosis not present

## 2022-11-14 DIAGNOSIS — G4700 Insomnia, unspecified: Secondary | ICD-10-CM | POA: Diagnosis not present

## 2022-11-14 DIAGNOSIS — G894 Chronic pain syndrome: Secondary | ICD-10-CM | POA: Diagnosis not present

## 2022-11-14 DIAGNOSIS — M62830 Muscle spasm of back: Secondary | ICD-10-CM | POA: Diagnosis not present

## 2022-11-25 NOTE — Progress Notes (Unsigned)
BH MD/PA/NP OP Progress Note  11/28/2022 3:07 PM [name]  MRN:  [id number]  Chief Complaint:  Chief Complaint  Patient presents with   Follow-up   HPI:  - She is not seen since 01/29/22.  This is a follow-up appointment for depression. Her husband presents to the visit and helps to elaborate some of the story.   She states that she ran out of her medication for the past several months.  She did not feel like presenting to the appointment.  She does not feel ready for the shower.  She partly attributes it to her asthma, and the way she feels.  She is in the house most of the time except that she uses a walker to get around.  She reports significant pain on her neck, back and legs.  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  She has not been seen by rheumatologist for many years.  She is concerned about her father, who has CHF and dementia. The patient has mood symptoms as in PHQ-9/GAD-7.  She has middle insomnia.  Although she reports passive SI, she denies any plan or intent.  She has a high anxiety, and is worried about things "which has not happened."  She thinks venlafaxine was working very well for mood and pain to some extent.  She could not continue nortriptyline due to hallucinations.     Wt Readings from Last 3 Encounters:  11/28/22 205 lb (93 kg)  05/30/22 205 lb (93 kg)  02/23/21 200 lb 9.6 oz (91 kg)     Employment: unemployed, disability due to back pain s/p six back surgery Support: husband Household:  Husband, grandchild (age 71. Moved in since age 7 to help patient and her husband for transportation) Marital status: married Number of children: 1 daughter ("really good' relationship) She has "wonderful" parents. Her mother deceased January 30, 2020.   Visit Diagnosis:    ICD-10-CM   1. MDD (major depressive disorder), recurrent episode, mild  F33.0 CBC    VITAMIN D 25 Hydroxy (Vit-D Deficiency, Fractures)     2. Restless leg syndrome  G25.81 Ferritin    3. 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 Vitamin D deficiency, unspecified  E55.9 VITAMIN D 25 Hydroxy (Vit-D Deficiency, Fractures)    5. Insomnia, unspecified type  G47.00 Ambulatory referral to Pulmonology      Past Psychiatric History: Please see initial evaluation for full details. I have reviewed the history. No updates at this time.     Past Medical History:  Past Medical History:  Diagnosis Date   Allergic rhinitis    Anxiety    Arthralgia    Asthma    Bronchitis, chronic    Chronic back pain    Collagenous colitis    followed by Dr Markham Jordan   COVID-19    09/03/20 with pneumonia   Depression    Diplopia 11/15/2013   Dysphagia    Endometriosis    Fibrocystic breast disease    GERD (gastroesophageal reflux disease)    History of colon polyps    Hyperlipidemia    Hypertension    Hypokalemia    IBS (irritable bowel syndrome)    Memory difficulty 11/15/2013   Migraine headache    Pneumonia 2017   x2   PONV (postoperative nausea and vomiting)    used patch   Sleep apnea  recommended CPAP   Ulcer disease    Urine incontinence     Past Surgical History:  Procedure Laterality Date   ABDOMINAL HYSTERECTOMY     endometriosis   BACK SURGERY     x6   BREAST BIOPSY Right    COLONOSCOPY WITH PROPOFOL N/A 06/25/2015   Procedure: COLONOSCOPY WITH PROPOFOL;  Surgeon: Scot Jun, MD;  Location: [facility] ENDOSCOPY;  Service: Endoscopy;  Laterality: N/A;   DILATION AND CURETTAGE OF UTERUS     and hysteroscopy   ESOPHAGOGASTRODUODENOSCOPY N/A 06/25/2015   Procedure: ESOPHAGOGASTRODUODENOSCOPY (EGD);  Surgeon: Scot Jun, MD;  Location: [facility] ENDOSCOPY;  Service: Endoscopy;  Laterality: N/A;   insertion of permanent spinal cord stimulator     insertion of trial spinal cord stimulator     LUMBAR DISC SURGERY     LUMBAR FUSION  4/05 and 8/07   LUMBAR  MICRODISCECTOMY  03/28/03   L4-5 L5   MOUTH SURGERY     NASAL SEPTOPLASTY W/ TURBINOPLASTY     removal of spinal cord stimulator     SEPTOPLASTY     with reduction of turbinates   THYROIDECTOMY N/A 06/06/2016   Procedure: THYROIDECTOMY;  Surgeon: Earline Mayotte, MD;  Location: [facility] ORS;  Service: General;  Laterality: N/A;    Family Psychiatric History: Please see initial evaluation for full details. I have reviewed the history. No updates at this time.     Family History:  Family History  Problem Relation Age of Onset   Allergies Mother    Hypertension Mother    Arthritis Mother    Hyperlipidemia Mother    Stroke Mother    Hypertension Father    Arthritis Father    Diabetes Father    Hypertension Brother    Diabetes Brother    Arthritis Maternal Grandmother    Hyperlipidemia Maternal Grandmother    Hyperlipidemia Maternal Grandfather    Arthritis Paternal Grandmother    Arthritis Paternal Grandfather    Asthma Other        several family member   Leukemia Other        uncle   Lymphoma Maternal Aunt        non hodgkins    Social History:  Social History   Socioeconomic History   Marital status: Married    Spouse name: douglas   Number of children: 1   Years of education: 12 th   Highest education level: High school graduate  Occupational History   Occupation: Disabled    Employer: DISABLED  Tobacco Use   Smoking status: Never   Smokeless tobacco: Never  Vaping Use   Vaping Use: Never used  Substance and Sexual Activity   Alcohol use: No    Alcohol/week: 0.0 standard drinks of alcohol   Drug use: No   Sexual activity: Not Currently  Other Topics Concern   Not on file  Social History Narrative   ** Merged History Encounter **       Social Determinants of Health   Financial Resource Strain: Low Risk  (05/05/2020)   Overall Financial Resource Strain (CARDIA)    Difficulty of Paying Living Expenses: Not hard at all  Food Insecurity: No Food  Insecurity (05/05/2020)   Hunger Vital Sign    Worried About Running Out of Food in the Last Year: Never true    Ran Out of Food in the Last Year: Never true  Transportation Needs: No Transportation Needs (05/05/2020)   PRAPARE - Transportation    Lack  of Transportation (Medical): No    Lack of Transportation (Non-Medical): No  Physical Activity: Inactive (07/17/2017)   Exercise Vital Sign    Days of Exercise per Week: 0 days    Minutes of Exercise per Session: 0 min  Stress: No Stress Concern Present (05/05/2020)   [employer] of Occupational Health - Occupational Stress Questionnaire    Feeling of Stress : Not at all  Social Connections: Unknown (05/05/2020)   Social Connection and Isolation Panel [NHANES]    Frequency of Communication with Friends and Family: Not on file    Frequency of Social Gatherings with Friends and Family: Not on file    Attends Religious Services: Not on file    Active Member of Clubs or Organizations: Not on file    Attends Banker Meetings: Not on file    Marital Status: Married    Allergies:  Allergies  Allergen Reactions   Fentanyl Other (See Comments)    Other reaction(s): Headache, Other (See Comments) headache SEVERE HEADACHE headache   Sumatriptan Other (See Comments)    Other reaction(s): Headache Severe headache   Doxycycline Other (See Comments)    Other reaction(s): Headache, Other (See Comments) REACTION: causes severe abd pain REACTION: causes severe abd pain REACTION: causes severe abd pain   Montelukast Other (See Comments)    SEVERE HEADACHE   Oxymorphone Other (See Comments)    REACTION: severe headaches   Benzoin Rash   Buspirone Other (See Comments)    SEVERE HEADACHE   Ciprofloxacin Nausea Only   Conjugated Estrogens Other (See Comments)    headaches   Cymbalta [Duloxetine Hcl] Other (See Comments)    Headache   Duloxetine Other (See Comments)    Headache   Erythromycin Ethylsuccinate Other (See  Comments)    Stomach ache   Estradiol Other (See Comments)    headache   Metronidazole Other (See Comments)    abd pain   Mirtazapine Other (See Comments)    headache    Metabolic Disorder Labs: Lab Results  Component Value Date   HGBA1C 5.4 05/28/2020   No results found for: "PROLACTIN" Lab Results  Component Value Date   CHOL 181 05/30/2022   TRIG 211.0 (H) 05/30/2022   HDL 43.10 05/30/2022   CHOLHDL 4 05/30/2022   VLDL 42.2 (H) 05/30/2022   LDLCALC 121 (H) 07/12/2016   LDLCALC 187 (H) 08/03/2015   Lab Results  Component Value Date   TSH 1.54 05/30/2022   TSH 0.480 09/06/2020    Therapeutic Level Labs: No results found for: "LITHIUM" No results found for: "VALPROATE" No results found for: "CBMZ"  Current Medications: Current Outpatient Medications  Medication Sig Dispense Refill   gabapentin (NEURONTIN) 600 MG tablet Take 600 mg by mouth 3 (three) times daily.     venlafaxine XR (EFFEXOR-XR) 150 MG 24 hr capsule Take 1 capsule (150 mg total) by mouth daily with breakfast. 75 mg daily for one week, then 150 mg daily for one week, then total of 225 mg daily (use along with 75 mg cap) 30 capsule 1   venlafaxine XR (EFFEXOR-XR) 75 MG 24 hr capsule Take 1 capsule (75 mg total) by mouth daily with breakfast. 75 mg daily for one week, then 150 mg daily for one week, then total of 225 mg daily (use along with 75 mg cap) 30 capsule 1   albuterol (VENTOLIN HFA) 108 (90 Base) MCG/ACT inhaler TAKE 2 PUFFS BY MOUTH EVERY 6 HOURS AS NEEDED FOR WHEEZE OR SHORTNESS OF  BREATH 18 each 1   amLODipine (NORVASC) 5 MG tablet TAKE 1 TABLET BY MOUTH EVERY DAY 90 tablet 1   ascorbic acid (VITAMIN C) 500 MG tablet Take 1 tablet (500 mg total) by mouth daily. 30 tablet 0   bisacodyl (DULCOLAX) 5 MG EC tablet Take 5 mg by mouth daily as needed for moderate constipation.     diazepam (VALIUM) 5 MG tablet Take 5 mg by mouth 3 (three) times daily as needed for anxiety.     docusate sodium (COLACE)  100 MG capsule Take 100 mg by mouth 2 (two) times daily.     Fluticasone-Umeclidin-Vilant (TRELEGY ELLIPTA) 100-62.5-25 MCG/ACT AEPB INHALE 1 PUFF BY MOUTH EVERY DAY 60 each 1   GRALISE 600 MG TABS Take 600 mg by mouth 3 (three) times daily.     hydrochlorothiazide (HYDRODIURIL) 25 MG tablet Take 1 tablet (25 mg total) by mouth daily. (Patient not taking: Reported on 11/28/2022) 30 tablet 1   metoprolol tartrate (LOPRESSOR) 50 MG tablet TAKE 1/2 TABLET BY MOUTH TWICE A DAY (NEED OFFICE VISIT) 90 tablet 1   Multiple Vitamin (MULTIVITAMIN) capsule Take 1 capsule by mouth daily.     mupirocin ointment (BACTROBAN) 2 % Apply 1 Application topically 2 (two) times daily. 22 g 0   Omega-3 Fatty Acids (FISH OIL) 1200 MG CAPS Take 1 capsule by mouth daily.     ondansetron (ZOFRAN) 4 MG tablet Take 1 tablet (4 mg total) by mouth every 8 (eight) hours as needed. 40 tablet 0   Oxycodone HCl 10 MG TABS Take 10 mg by mouth 3 (three) times daily as needed (breakthrough pain).     pantoprazole (PROTONIX) 40 MG tablet Take 1 tablet (40 mg total) by mouth daily. Take 30 minutes before breakfast 30 tablet 3   potassium chloride (KLOR-CON M10) 10 MEQ tablet TAKE ONE TABLET 2 TIMES A DAY, FOR 2 DAYS, THEN CONTINUE TO TAKE 1 TABLET BY MOUTH EVERY DAY 90 tablet 1   pravastatin (PRAVACHOL) 20 MG tablet TAKE 1 TABLET BY MOUTH EVERY DAY 90 tablet 0   predniSONE (DELTASONE) 10 MG tablet Take 4 tablets x 1 day then decrease by 1/2 tablet per day until down to zero mg. 18 tablet 0   sodium chloride (OCEAN) 0.65 % SOLN nasal spray Place 2 sprays into both nostrils daily as needed for congestion. 30 mL 2   TIROSINT 137 MCG CAPS TAKE 1 CAPSULE BY MOUTH EVERY DAY BEFORE BREAKFAST 30 capsule 2   XTAMPZA ER 36 MG C12A Take 36 mg by mouth every 12 (twelve) hours.  0   zinc sulfate 220 (50 Zn) MG capsule Take 1 capsule (220 mg total) by mouth daily. 30 capsule 0   zolpidem (AMBIEN) 10 MG tablet Take 10 mg by mouth at bedtime as needed  for sleep.     No current facility-administered medications for this visit.     Musculoskeletal: Strength & Muscle Tone: within normal limits Gait & Station: normal Patient leans: N/A  Psychiatric Specialty Exam: Review of Systems  Psychiatric/Behavioral:  Positive for dysphoric mood and sleep disturbance. Negative for agitation, behavioral problems, confusion, decreased concentration, hallucinations, self-injury and suicidal ideas. The patient is nervous/anxious. The patient is not hyperactive.   All other systems reviewed and are negative.   Blood pressure 102/66, pulse 67, temperature 97.7 F (36.5 C), temperature source Skin, height 5\' 7"  (1.702 m), weight 205 lb (93 kg).Body mass index is 32.11 kg/m.  General Appearance: Fairly Groomed  Eye Contact:  Good  Speech:  Clear and Coherent  Volume:  Normal  Mood:   depressed  Affect:  Appropriate, Congruent, and calm  Thought Process:  Coherent  Orientation:  Full (Time, Place, and Person)  Thought Content: Logical   Suicidal Thoughts:  No  Homicidal Thoughts:  No  Memory:  Immediate;   Good  Judgement:  Good  Insight:  Good  Psychomotor Activity:  Normal  Concentration:  Concentration: Good and Attention Span: Good  Recall:  Good  Fund of Knowledge: Good  Language: Good  Akathisia:  No  Handed:  Right  AIMS (if indicated): not done  Assets:  Communication Skills Desire for Improvement  ADL's:  Intact  Cognition: WNL  Sleep:  Poor   Screenings: Oceanographer Row Office Visit from 11/28/2022 in [facility] Office Visit from 05/30/2022 in [facility] [employer] [facility] Video Visit from 08/26/2021 in [facility] Office Visit from 02/23/2021 in [facility] [employer] Video Visit from 11/05/2020 in [facility] [employer] [facility]  PHQ-2 Total Score 6 1 2 4  0  PHQ-9  Total Score 19 -- 14 12 0      Flowsheet Row Office Visit from 11/28/2022 in [facility] Video Visit from 08/26/2021 in [facility] Video Visit from 01/14/2021 in [facility]  C-SSRS RISK CATEGORY Error: Q3, 4, or 5 should not be populated when Q2 is No Error: Q3, 4, or 5 should not be populated when Q2 is No Low Risk        Assessment and Plan:  SUMMIT BORCHARDT is a 62 y.o. year old female with a history of depression, anxiety,  HTN, HL, dysphasia, asthma. migraine, sleep apnea (not able to use cpap machine), s/p covid pneumonia in 08/2020, who presents for follow up appointment for below.   1. MDD (major depressive disorder), recurrent episode, mild Acute stressors include: her father with dementia, CHF  Other stressors include: loss of her mother in June 2021, neck through back pain    History:   She reports worsening in depressive symptoms and anxiety in the context of non adherence to medication.  She also discontinued nortriptyline due to adverse reaction of hallucinations.  Will restart venlafaxine to target depression.   2. Restless leg syndrome 3. 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  Will check ferritin level and treat accordingly.   4. Vitamin D deficiency, unspecified Will obtain level and service appointment as needed given her history of depression.   5. Insomnia, unspecified type She reports history of sleep apnea, and does not use CPAP machine for many years.  She agrees to have reevaluation of this.    Plan Start venlafaxine at 75 mg daily, with weekly increases of 75 mg up to a maximum of 225 mg daily. Hold nortriptyline Next appointment: 6/13 at 3:30 for 30 mins, in person Obtain labs (vitamin D, ferritin, CBC) Referral for evaluation of sleep  apnea - on Ambien 10 mg at night, valium 5 mg TID for muscle spasm - gabapentin 600 mg TID for nerve pain  Past trials- duloxetine (did not like), nortriptyline ([facility])   The patient demonstrates the following risk factors for suicide: Chronic risk factors for suicide include: psychiatric disorder of depression. Acute risk factors for suicide include:  unemployment. Protective factors for this patient include: positive social support, coping skills and hope for the future. Considering these factors, the overall suicide risk at this point appears to be low. Patient is appropriate for outpatient follow up.        Collaboration of Care: Collaboration of Care: Other reviewed notes in Epic  Patient/Guardian was advised Release of Information must be obtained prior to any record release in order to collaborate their care with an outside provider. Patient/Guardian was advised if they have not already done so to contact the registration department to sign all necessary forms in order for [place] to release information regarding their care.   Consent: Patient/Guardian gives verbal consent for treatment and assignment of benefits for services provided during this visit. Patient/Guardian expressed understanding and agreed to proceed.    Neysa Hotter, MD 11/28/2022, 3:07 PM

## 2022-11-28 ENCOUNTER — Encounter: Payer: Self-pay | Admitting: Psychiatry

## 2022-11-28 ENCOUNTER — Other Ambulatory Visit
Admission: RE | Admit: 2022-11-28 | Discharge: 2022-11-28 | Disposition: A | Discharge status: Home / Self Care | Payer: Medicare Other | Source: Ambulatory Visit | Attending: Psychiatry | Admitting: Psychiatry

## 2022-11-28 ENCOUNTER — Ambulatory Visit (INDEPENDENT_AMBULATORY_CARE_PROVIDER_SITE_OTHER): Payer: Medicare Other | Admitting: Psychiatry

## 2022-11-28 VITALS — BP 102/66 | HR 67 | Temp 97.7°F | Ht 67.0 in | Wt 205.0 lb

## 2022-11-28 DIAGNOSIS — E559 Vitamin D deficiency, unspecified: Secondary | ICD-10-CM

## 2022-11-28 DIAGNOSIS — F330 Major depressive disorder, recurrent, mild: Secondary | ICD-10-CM | POA: Diagnosis not present

## 2022-11-28 DIAGNOSIS — G2581 Restless legs syndrome: Secondary | ICD-10-CM | POA: Diagnosis not present

## 2022-11-28 DIAGNOSIS — G4700 Insomnia, unspecified: Secondary | ICD-10-CM | POA: Diagnosis not present

## 2022-11-28 DIAGNOSIS — E611 Iron deficiency: Secondary | ICD-10-CM

## 2022-11-28 LAB — CBC
HCT: 42.9 % (ref 36.0–46.0)
Hemoglobin: 14.1 g/dL (ref 12.0–15.0)
MCH: 29.8 pg (ref 26.0–34.0)
MCHC: 32.9 g/dL (ref 30.0–36.0)
MCV: 90.7 fL (ref 80.0–100.0)
Platelets: 234 10*3/uL (ref 150–400)
RBC: 4.73 MIL/uL (ref 3.87–5.11)
RDW: 12.2 % (ref 11.5–15.5)
WBC: 7.4 10*3/uL (ref 4.0–10.5)
nRBC: 0 % (ref 0.0–0.2)

## 2022-11-28 LAB — VITAMIN D 25 HYDROXY (VIT D DEFICIENCY, FRACTURES): Vit D, 25-Hydroxy: 30.74 ng/mL (ref 30–100)

## 2022-11-28 LAB — FERRITIN: Ferritin: 30 ng/mL (ref 11–307)

## 2022-11-28 MED ORDER — VENLAFAXINE HCL ER 75 MG PO CP24: 75.0000 mg | ORAL_CAPSULE | Freq: Every day | ORAL | 1 refills | Status: DC

## 2022-11-28 MED ORDER — VENLAFAXINE HCL ER 150 MG PO CP24: 150.0000 mg | ORAL_CAPSULE | Freq: Every day | ORAL | 1 refills | Status: DC

## 2022-11-28 NOTE — Patient Instructions (Signed)
Start venlafaxine at 75 mg daily, with weekly increases of 75 mg up to a maximum of 225 mg daily. Hold nortriptyline Next appointment: 6/13 at 3:30

## 2022-11-29 ENCOUNTER — Encounter: Payer: Self-pay | Admitting: Psychiatry

## 2022-11-29 NOTE — Progress Notes (Signed)
Called to inform patient of lab results no answer left voicemail for patient to return call to office

## 2022-11-29 NOTE — Progress Notes (Signed)
Please contact the patient and recommend the following.   Vitamin D levels are marginally lower. I would advise taking 5000 IU of vitamin D daily to replenish this.  Ferritin levels are within the lower range. I recommend taking an iron tablet, such as Ferrous sulfate 325 mg, daily to replenish this. Other labs are within acceptable range.

## 2022-12-01 NOTE — Progress Notes (Signed)
I have been trying for a couple of days to reach patient to make aware of lab results no answer left another voicemail for patient to return call to office

## 2022-12-07 ENCOUNTER — Telehealth: Payer: Self-pay

## 2022-12-07 NOTE — Telephone Encounter (Signed)
left message for patient to call office back and the lab work results and recommendations were left also.

## 2022-12-11 ENCOUNTER — Other Ambulatory Visit: Payer: Self-pay | Admitting: Internal Medicine

## 2022-12-12 DIAGNOSIS — M62830 Muscle spasm of back: Secondary | ICD-10-CM | POA: Diagnosis not present

## 2022-12-12 DIAGNOSIS — G4700 Insomnia, unspecified: Secondary | ICD-10-CM | POA: Diagnosis not present

## 2022-12-12 DIAGNOSIS — M47817 Spondylosis without myelopathy or radiculopathy, lumbosacral region: Secondary | ICD-10-CM | POA: Diagnosis not present

## 2022-12-12 DIAGNOSIS — G894 Chronic pain syndrome: Secondary | ICD-10-CM | POA: Diagnosis not present

## 2022-12-23 ENCOUNTER — Other Ambulatory Visit: Payer: Self-pay | Admitting: Psychiatry

## 2022-12-31 ENCOUNTER — Other Ambulatory Visit: Payer: Self-pay | Admitting: Internal Medicine

## 2023-01-11 DIAGNOSIS — M62830 Muscle spasm of back: Secondary | ICD-10-CM | POA: Diagnosis not present

## 2023-01-11 DIAGNOSIS — M47817 Spondylosis without myelopathy or radiculopathy, lumbosacral region: Secondary | ICD-10-CM | POA: Diagnosis not present

## 2023-01-11 DIAGNOSIS — G4700 Insomnia, unspecified: Secondary | ICD-10-CM | POA: Diagnosis not present

## 2023-01-11 DIAGNOSIS — G894 Chronic pain syndrome: Secondary | ICD-10-CM | POA: Diagnosis not present

## 2023-01-26 ENCOUNTER — Ambulatory Visit: Payer: Medicare Other | Admitting: Psychiatry

## 2023-02-08 DIAGNOSIS — M62830 Muscle spasm of back: Secondary | ICD-10-CM | POA: Diagnosis not present

## 2023-02-08 DIAGNOSIS — G894 Chronic pain syndrome: Secondary | ICD-10-CM | POA: Diagnosis not present

## 2023-02-08 DIAGNOSIS — G4700 Insomnia, unspecified: Secondary | ICD-10-CM | POA: Diagnosis not present

## 2023-02-08 DIAGNOSIS — M47817 Spondylosis without myelopathy or radiculopathy, lumbosacral region: Secondary | ICD-10-CM | POA: Diagnosis not present

## 2023-02-14 ENCOUNTER — Telehealth (HOSPITAL_COMMUNITY): Payer: Self-pay

## 2023-02-14 ENCOUNTER — Other Ambulatory Visit: Payer: Self-pay | Admitting: Psychiatry

## 2023-02-14 MED ORDER — VENLAFAXINE HCL ER 150 MG PO CP24: 150.0000 mg | ORAL_CAPSULE | Freq: Every day | ORAL | 1 refills | Status: DC

## 2023-02-14 MED ORDER — VENLAFAXINE HCL ER 75 MG PO CP24: 75.0000 mg | ORAL_CAPSULE | Freq: Every day | ORAL | 1 refills | Status: DC

## 2023-02-14 NOTE — Telephone Encounter (Signed)
Medication management -Called pt's husband back to inform Dr. Vanetta Shawl had sent in both of his wife's needed new orders for Venlafaxine XR, teh 150 mg and 75 mg to their [facility] pharmacy.

## 2023-02-14 NOTE — Telephone Encounter (Signed)
Medication refill - Call from patient's husband stating patient was in need of both dosages of her prescribed Venlafaxine XR, thte 75 mg and 150 mg, both last prescribed when seen 11/28/22 + 1 refill. Patient had an appt 01/26/23 that was canceled and rescheduled for 03/21/23 when provider was out.  Patient now in need of both new orders.

## 2023-02-14 NOTE — Telephone Encounter (Signed)
Ordered

## 2023-03-10 ENCOUNTER — Other Ambulatory Visit: Payer: Self-pay | Admitting: Internal Medicine

## 2023-03-16 DIAGNOSIS — M62830 Muscle spasm of back: Secondary | ICD-10-CM | POA: Diagnosis not present

## 2023-03-16 DIAGNOSIS — M47817 Spondylosis without myelopathy or radiculopathy, lumbosacral region: Secondary | ICD-10-CM | POA: Diagnosis not present

## 2023-03-16 DIAGNOSIS — G894 Chronic pain syndrome: Secondary | ICD-10-CM | POA: Diagnosis not present

## 2023-03-16 DIAGNOSIS — G4700 Insomnia, unspecified: Secondary | ICD-10-CM | POA: Diagnosis not present

## 2023-03-17 NOTE — Progress Notes (Unsigned)
Virtual Visit via Video Note  I connected with [name] on 03/21/23 at  2:00 PM EDT by a video enabled telemedicine application and verified that I am speaking with the correct person using two identifiers.  Location: Patient: home Provider: office Persons participated in the visit- patient, provider    I discussed the limitations of evaluation and management by telemedicine and the availability of in person appointments. The patient expressed understanding and agreed to proceed.   I discussed the assessment and treatment plan with the patient. The patient was provided an opportunity to ask questions and all were answered. The patient agreed with the plan and demonstrated an understanding of the instructions.   The patient was advised to call back or seek an in-person evaluation if the symptoms worsen or if the condition fails to improve as anticipated.  I provided 25 minutes of non-face-to-face time during this encounter.   Neysa Hotter, MD    [facility] MD/PA/NP OP Progress Note  03/21/2023 2:36 PM [name]  MRN:  [id number]  Chief Complaint:  Chief Complaint  Patient presents with   Follow-up   HPI:  This is a follow-up appointment for depression, insomnia, restless leg.  She states that her father died in Dec 21, 2022.  She feels a sense of relief as she watched him going very bad.  She spends time doing nothing, which she currently attributes to asthma and shortness of breath.  She enjoys being with her grandchildren.  One of her grandchild will go to college and play football.  She has been taking the venlafaxine consistently.  She thinks it has been helping her mood to some extent.  However, she still experiences fatigue.  She does not think she is doing as she used to.  Although she used to complain obsessively, she does not feel like doing so.  She is struggling with back pain.  She has been sleeping better since being on CBD Gummies, which was reportedly encouraged by her pain  specialist.  She denies change in appetite.  She denies SI.    Substance use  Tobacco Alcohol Other substances/  Current denies denies CBD Gummies  Past denies denies denies  Past Treatment        Employment: unemployed, disability due to back pain s/p six back surgery Support: husband Household:  Husband, grandchild (age 66. Moved in since age 35 to help patient and her husband for transportation) Marital status: married Number of children: 1 daughter ("really good' relationship) She has "wonderful" parents. Her mother deceased 20-Feb-2020.    Visit Diagnosis:    ICD-10-CM   1. MDD (major depressive disorder), recurrent episode, mild (HCC)  F33.0     2. Restless leg syndrome  G25.81     3. 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 Insomnia, unspecified type  G47.00 Ambulatory referral to Pulmonology      Past Psychiatric History: Please see initial evaluation for full details. I have reviewed the history. No updates at this time.     Past Medical History:  Past Medical History:  Diagnosis Date   Allergic rhinitis    Anxiety    Arthralgia    Asthma    Bronchitis, chronic (HCC)    Chronic back pain    Collagenous colitis    followed by Dr Markham Jordan   COVID-19    09/03/20 with pneumonia   Depression    Diplopia 11/15/2013   Dysphagia  Endometriosis    Fibrocystic breast disease    GERD (gastroesophageal reflux disease)    History of colon polyps    Hyperlipidemia    Hypertension    Hypokalemia    IBS (irritable bowel syndrome)    Memory difficulty 11/15/2013   Migraine headache    Pneumonia 2017   x2   PONV (postoperative nausea and vomiting)    used patch   Sleep apnea    recommended CPAP   Ulcer disease    Urine incontinence     Past Surgical History:  Procedure Laterality Date   ABDOMINAL HYSTERECTOMY     endometriosis   BACK SURGERY     x6   BREAST BIOPSY Right    COLONOSCOPY WITH PROPOFOL N/A  06/25/2015   Procedure: COLONOSCOPY WITH PROPOFOL;  Surgeon: Scot Jun, MD;  Location: [facility] ENDOSCOPY;  Service: Endoscopy;  Laterality: N/A;   DILATION AND CURETTAGE OF UTERUS     and hysteroscopy   ESOPHAGOGASTRODUODENOSCOPY N/A 06/25/2015   Procedure: ESOPHAGOGASTRODUODENOSCOPY (EGD);  Surgeon: Scot Jun, MD;  Location: [facility] ENDOSCOPY;  Service: Endoscopy;  Laterality: N/A;   insertion of permanent spinal cord stimulator     insertion of trial spinal cord stimulator     LUMBAR DISC SURGERY     LUMBAR FUSION  4/05 and 8/07   LUMBAR MICRODISCECTOMY  03/28/03   L4-5 L5   MOUTH SURGERY     NASAL SEPTOPLASTY W/ TURBINOPLASTY     removal of spinal cord stimulator     SEPTOPLASTY     with reduction of turbinates   THYROIDECTOMY N/A 06/06/2016   Procedure: THYROIDECTOMY;  Surgeon: Earline Mayotte, MD;  Location: [facility] ORS;  Service: General;  Laterality: N/A;    Family Psychiatric History: Please see initial evaluation for full details. I have reviewed the history. No updates at this time.     Family History:  Family History  Problem Relation Age of Onset   Allergies Mother    Hypertension Mother    Arthritis Mother    Hyperlipidemia Mother    Stroke Mother    Hypertension Father    Arthritis Father    Diabetes Father    Hypertension Brother    Diabetes Brother    Arthritis Maternal Grandmother    Hyperlipidemia Maternal Grandmother    Hyperlipidemia Maternal Grandfather    Arthritis Paternal Grandmother    Arthritis Paternal Grandfather    Asthma Other        several family member   Leukemia Other        uncle   Lymphoma Maternal Aunt        non hodgkins    Social History:  Social History   Socioeconomic History   Marital status: Married    Spouse name: douglas   Number of children: 1   Years of education: 12 th   Highest education level: High school graduate  Occupational History   Occupation: Disabled    Employer: DISABLED  Tobacco Use    Smoking status: Never   Smokeless tobacco: Never  Vaping Use   Vaping status: Never Used  Substance and Sexual Activity   Alcohol use: No    Alcohol/week: 0.0 standard drinks of alcohol   Drug use: No   Sexual activity: Not Currently  Other Topics Concern   Not on file  Social History Narrative   ** Merged History Encounter **       Social Determinants of Health   Financial Resource Strain: Low Risk  (  05/05/2020)   Overall Financial Resource Strain (CARDIA)    Difficulty of Paying Living Expenses: Not hard at all  Food Insecurity: No Food Insecurity (05/05/2020)   Hunger Vital Sign    Worried About Running Out of Food in the Last Year: Never true    Ran Out of Food in the Last Year: Never true  Transportation Needs: No Transportation Needs (05/05/2020)   PRAPARE - Administrator, Civil Service (Medical): No    Lack of Transportation (Non-Medical): No  Physical Activity: Inactive (07/17/2017)   Exercise Vital Sign    Days of Exercise per Week: 0 days    Minutes of Exercise per Session: 0 min  Stress: No Stress Concern Present (05/05/2020)   [employer] of Occupational Health - Occupational Stress Questionnaire    Feeling of Stress : Not at all  Social Connections: Unknown (05/05/2020)   Social Connection and Isolation Panel [NHANES]    Frequency of Communication with Friends and Family: Not on file    Frequency of Social Gatherings with Friends and Family: Not on file    Attends Religious Services: Not on file    Active Member of Clubs or Organizations: Not on file    Attends Banker Meetings: Not on file    Marital Status: Married    Allergies:  Allergies  Allergen Reactions   Fentanyl Other (See Comments)    Other reaction(s): Headache, Other (See Comments) headache SEVERE HEADACHE headache   Sumatriptan Other (See Comments)    Other reaction(s): Headache Severe headache   Doxycycline Other (See Comments)    Other reaction(s):  Headache, Other (See Comments) REACTION: causes severe abd pain REACTION: causes severe abd pain REACTION: causes severe abd pain   Montelukast Other (See Comments)    SEVERE HEADACHE   Oxymorphone Other (See Comments)    REACTION: severe headaches   Benzoin Rash   Buspirone Other (See Comments)    SEVERE HEADACHE   Ciprofloxacin Nausea Only   Conjugated Estrogens Other (See Comments)    headaches   Cymbalta [Duloxetine Hcl] Other (See Comments)    Headache   Duloxetine Other (See Comments)    Headache   Erythromycin Ethylsuccinate Other (See Comments)    Stomach ache   Estradiol Other (See Comments)    headache   Metronidazole Other (See Comments)    abd pain   Mirtazapine Other (See Comments)    headache    Metabolic Disorder Labs: Lab Results  Component Value Date   HGBA1C 5.4 05/28/2020   No results found for: "PROLACTIN" Lab Results  Component Value Date   CHOL 181 05/30/2022   TRIG 211.0 (H) 05/30/2022   HDL 43.10 05/30/2022   CHOLHDL 4 05/30/2022   VLDL 42.2 (H) 05/30/2022   LDLCALC 121 (H) 07/12/2016   LDLCALC 187 (H) 08/03/2015   Lab Results  Component Value Date   TSH 1.54 05/30/2022   TSH 0.480 09/06/2020    Therapeutic Level Labs: No results found for: "LITHIUM" No results found for: "VALPROATE" No results found for: "CBMZ"  Current Medications: Current Outpatient Medications  Medication Sig Dispense Refill   albuterol (VENTOLIN HFA) 108 (90 Base) MCG/ACT inhaler TAKE 2 PUFFS BY MOUTH EVERY 6 HOURS AS NEEDED FOR WHEEZE OR SHORTNESS OF BREATH 18 each 1   amLODipine (NORVASC) 5 MG tablet TAKE 1 TABLET BY MOUTH EVERY DAY 90 tablet 1   ascorbic acid (VITAMIN C) 500 MG tablet Take 1 tablet (500 mg total) by mouth daily. 30  tablet 0   bisacodyl (DULCOLAX) 5 MG EC tablet Take 5 mg by mouth daily as needed for moderate constipation.     diazepam (VALIUM) 5 MG tablet Take 5 mg by mouth 3 (three) times daily as needed for anxiety.     docusate sodium  (COLACE) 100 MG capsule Take 100 mg by mouth 2 (two) times daily.     Fluticasone-Umeclidin-Vilant (TRELEGY ELLIPTA) 100-62.5-25 MCG/ACT AEPB INHALE 1 PUFF BY MOUTH EVERY DAY 60 each 1   gabapentin (NEURONTIN) 600 MG tablet Take 600 mg by mouth 3 (three) times daily.     GRALISE 600 MG TABS Take 600 mg by mouth 3 (three) times daily.     hydrochlorothiazide (HYDRODIURIL) 25 MG tablet Take 1 tablet (25 mg total) by mouth daily. (Patient not taking: Reported on 11/28/2022) 30 tablet 1   metoprolol tartrate (LOPRESSOR) 50 MG tablet TAKE 1/2 TABLET BY MOUTH TWICE A DAY (NEED OFFICE VISIT) 90 tablet 0   Multiple Vitamin (MULTIVITAMIN) capsule Take 1 capsule by mouth daily.     mupirocin ointment (BACTROBAN) 2 % Apply 1 Application topically 2 (two) times daily. 22 g 0   Omega-3 Fatty Acids (FISH OIL) 1200 MG CAPS Take 1 capsule by mouth daily.     ondansetron (ZOFRAN) 4 MG tablet Take 1 tablet (4 mg total) by mouth every 8 (eight) hours as needed. 40 tablet 0   Oxycodone HCl 10 MG TABS Take 10 mg by mouth 3 (three) times daily as needed (breakthrough pain).     pantoprazole (PROTONIX) 40 MG tablet Take 1 tablet (40 mg total) by mouth daily. Take 30 minutes before breakfast 30 tablet 3   potassium chloride (KLOR-CON M10) 10 MEQ tablet TAKE ONE TABLET 2 TIMES A DAY, FOR 2 DAYS, THEN CONTINUE TO TAKE 1 TABLET BY MOUTH EVERY DAY 90 tablet 1   pravastatin (PRAVACHOL) 20 MG tablet TAKE 1 TABLET BY MOUTH EVERY DAY 90 tablet 0   predniSONE (DELTASONE) 10 MG tablet Take 4 tablets x 1 day then decrease by 1/2 tablet per day until down to zero mg. 18 tablet 0   sodium chloride (OCEAN) 0.65 % SOLN nasal spray Place 2 sprays into both nostrils daily as needed for congestion. 30 mL 2   TIROSINT 137 MCG CAPS TAKE 1 CAPSULE BY MOUTH EVERY DAY BEFORE BREAKFAST 90 capsule 0   venlafaxine XR (EFFEXOR-XR) 150 MG 24 hr capsule Take 1 capsule (150 mg total) by mouth daily with breakfast. total of 225 mg daily (use along with  75 mg cap) 30 capsule 1   venlafaxine XR (EFFEXOR-XR) 75 MG 24 hr capsule Take 1 capsule (75 mg total) by mouth daily with breakfast. total of 225 mg daily (use along with 75 mg cap) 30 capsule 1   XTAMPZA ER 36 MG C12A Take 36 mg by mouth every 12 (twelve) hours.  0   zinc sulfate 220 (50 Zn) MG capsule Take 1 capsule (220 mg total) by mouth daily. 30 capsule 0   zolpidem (AMBIEN) 10 MG tablet Take 10 mg by mouth at bedtime as needed for sleep.     No current facility-administered medications for this visit.     Musculoskeletal: Strength & Muscle Tone:  N/A Gait & Station:  N/A Patient leans: N/A  Psychiatric Specialty Exam: Review of Systems  There were no vitals taken for this visit.There is no height or weight on file to calculate BMI.  General Appearance: Fairly Groomed  Eye Contact:  Good  Speech:  Clear and Coherent  Volume:  Normal  Mood:  Depressed  Affect:  Appropriate, Congruent, and calm, smiles  Thought Process:  Coherent  Orientation:  Full (Time, Place, and Person)  Thought Content: Logical   Suicidal Thoughts:  No  Homicidal Thoughts:  No  Memory:  Immediate;   Good  Judgement:  Good  Insight:  Good  Psychomotor Activity:  Normal  Concentration:  Concentration: Good and Attention Span: Good  Recall:  Good  Fund of Knowledge: Good  Language: Good  Akathisia:  No  Handed:  Right  AIMS (if indicated): not done  Assets:  Communication Skills Desire for Improvement  ADL's:  Intact  Cognition: WNL  Sleep:  Poor   Screenings: Oceanographer Row Office Visit from 11/28/2022 in [facility] Office Visit from 05/30/2022 in [facility] [employer] Video Visit from 08/26/2021 in [facility] Office Visit from 02/23/2021 in [facility] [employer] Video Visit from 11/05/2020 in [facility] [employer]  PHQ-2 Total Score 6 1 2 4  0  PHQ-9 Total Score 19 -- 14 12 0      Flowsheet Row Office Visit from 11/28/2022 in [facility] Video Visit from 08/26/2021 in [facility] Video Visit from 01/14/2021 in [facility]  C-SSRS RISK CATEGORY Error: Q3, 4, or 5 should not be populated when Q2 is No Error: Q3, 4, or 5 should not be populated when Q2 is No Low Risk        Assessment and Plan:  [name] is a 62 y.o. year old female with a history of depression, anxiety,  HTN, HL, dysphasia, asthma. migraine, sleep apnea (not able to use cpap machine), s/p covid pneumonia in 08/2020, who presents for follow up appointment for below.   1. MDD (major depressive disorder), recurrent episode, mild (HCC) Acute stressors include: loss of her father with dementia, CHF in April 2024 Other stressors include: loss of her mother in June 2021, neck through back pain    History:   Although she reports depressive symptoms, it has been overall improving since restarting venlafaxine.  Will continue current dose to target depression.  May consider adjunctive treatment if no improvement in her mood after the intervention as below for insomnia.   2. Restless leg syndrome 3. 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  Will plan to recheck ferritin level in the few months.   4. Insomnia, unspecified type -She has a history of sleep apnea, although she has not used CPAP machine for many years.  She continues to experience fatigue, although she sleeps better since starting CBD gummies, which was reportedly advised by her pain specialist..  We make referral for reevaluation.    Plan Continue venlafaxine 225 mg daily. Referral for sleep evaluation  Next appointment: 10/1 at 2 30, in person - plan to recheck  ferritin at the next viist - on Ambien 10 mg at night, valium 5 mg TID for muscle spasm - gabapentin 600 mg TID for nerve pain - iron pill   Past trials- duloxetine (did not like), nortriptyline (VH)   The patient demonstrates the following risk factors for suicide: Chronic risk factors for suicide include: psychiatric disorder of depression. Acute risk factors for suicide include: unemployment. Protective factors for this  patient include: positive social support, coping skills and hope for the future. Considering these factors, the overall suicide risk at this point appears to be low. Patient is appropriate for outpatient follow up.      Collaboration of Care: Collaboration of Care: Other reviewed notes in Epic  Patient/Guardian was advised Release of Information must be obtained prior to any record release in order to collaborate their care with an outside provider. Patient/Guardian was advised if they have not already done so to contact the registration department to sign all necessary forms in order for [place] to release information regarding their care.   Consent: Patient/Guardian gives verbal consent for treatment and assignment of benefits for services provided during this visit. Patient/Guardian expressed understanding and agreed to proceed.    Neysa Hotter, MD 03/21/2023, 2:36 PM

## 2023-03-18 ENCOUNTER — Other Ambulatory Visit: Payer: Self-pay | Admitting: Psychiatry

## 2023-03-21 ENCOUNTER — Telehealth (INDEPENDENT_AMBULATORY_CARE_PROVIDER_SITE_OTHER): Payer: Medicare Other | Admitting: Psychiatry

## 2023-03-21 ENCOUNTER — Encounter: Payer: Self-pay | Admitting: Psychiatry

## 2023-03-21 DIAGNOSIS — G4700 Insomnia, unspecified: Secondary | ICD-10-CM | POA: Diagnosis not present

## 2023-03-21 DIAGNOSIS — F330 Major depressive disorder, recurrent, mild: Secondary | ICD-10-CM | POA: Diagnosis not present

## 2023-03-21 DIAGNOSIS — E611 Iron deficiency: Secondary | ICD-10-CM

## 2023-03-21 DIAGNOSIS — G2581 Restless legs syndrome: Secondary | ICD-10-CM | POA: Diagnosis not present

## 2023-03-21 NOTE — Patient Instructions (Signed)
Continue venlafaxine 225 mg daily. Referral for sleep evaluation  Next appointment: 10/1 at 2 30

## 2023-03-29 ENCOUNTER — Encounter: Payer: Self-pay | Admitting: Internal Medicine

## 2023-04-13 DIAGNOSIS — G4700 Insomnia, unspecified: Secondary | ICD-10-CM | POA: Diagnosis not present

## 2023-04-13 DIAGNOSIS — M62830 Muscle spasm of back: Secondary | ICD-10-CM | POA: Diagnosis not present

## 2023-04-13 DIAGNOSIS — M47817 Spondylosis without myelopathy or radiculopathy, lumbosacral region: Secondary | ICD-10-CM | POA: Diagnosis not present

## 2023-04-13 DIAGNOSIS — G894 Chronic pain syndrome: Secondary | ICD-10-CM | POA: Diagnosis not present

## 2023-04-14 ENCOUNTER — Other Ambulatory Visit: Payer: Self-pay | Admitting: Psychiatry

## 2023-05-10 ENCOUNTER — Other Ambulatory Visit: Payer: Self-pay | Admitting: Internal Medicine

## 2023-05-10 NOTE — Progress Notes (Signed)
BH MD/PA/NP OP Progress Note  05/16/2023 3:14 PM [name]  MRN:  [id number]  Chief Complaint:  Chief Complaint  Patient presents with   Follow-up   HPI:  This is a follow-up appointment for depression and anxiety, insomnia.  She states that she is thinking about her father.  She feels depressed.  She stays in the bed most of the time.  She believes she might do a little more if she were not to have any back pain.  She feels that she does not have a life and just existing.  She does not talk with anybody.  However, on further elaboration, she enjoys being with her grandchildren.  She feels very good when she is around with them.  The patient has mood symptoms as in PHQ-9/GAD-7.  Although she reports passive SI, she adamantly denies any plan or intent.  She agrees to contact emergency resources if any worsening.   Her husband at the appointment states that she tends to be worried about things she cannot change.    Wt Readings from Last 3 Encounters:  05/16/23 186 lb 6.4 oz (84.6 kg)  11/28/22 205 lb (93 kg)  05/30/22 205 lb (93 kg)     Substance use   Tobacco Alcohol Other substances/  Current denies denies CBD Gummies  Past denies denies denies  Past Treatment            Employment: unemployed, disability due to back pain s/p six back surgery (used to work for medical transcription) Support: husband Household:  Husband, grandson (age 50. Moved in since age 57 to help patient and her husband for transportation) Marital status: married Number of children: 1 daughter ("really good' relationship) She has "wonderful" parents. Her mother deceased 02/15/2020.     Visit Diagnosis:    ICD-10-CM   1. MDD (major depressive disorder), recurrent episode, mild (HCC)  F33.0     2. Restless leg syndrome  G25.81     3. 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 Insomnia, unspecified type  G47.00       Past  Psychiatric History: Please see initial evaluation for full details. I have reviewed the history. No updates at this time.     Past Medical History:  Past Medical History:  Diagnosis Date   Allergic rhinitis    Anxiety    Arthralgia    Asthma    Bronchitis, chronic (HCC)    Chronic back pain    Collagenous colitis    followed by Dr Markham Jordan   COVID-19    09/03/20 with pneumonia   Depression    Diplopia 11/15/2013   Dysphagia    Endometriosis    Fibrocystic breast disease    GERD (gastroesophageal reflux disease)    History of colon polyps    Hyperlipidemia    Hypertension    Hypokalemia    IBS (irritable bowel syndrome)    Memory difficulty 11/15/2013   Migraine headache    Pneumonia 2017   x2   PONV (postoperative nausea and vomiting)    used patch   Sleep apnea    recommended CPAP   Ulcer disease    Urine incontinence     Past Surgical History:  Procedure Laterality Date   ABDOMINAL HYSTERECTOMY     endometriosis   BACK SURGERY     x6   BREAST BIOPSY Right    COLONOSCOPY WITH PROPOFOL N/A 06/25/2015  Procedure: COLONOSCOPY WITH PROPOFOL;  Surgeon: Scot Jun, MD;  Location: [facility] ENDOSCOPY;  Service: Endoscopy;  Laterality: N/A;   DILATION AND CURETTAGE OF UTERUS     and hysteroscopy   ESOPHAGOGASTRODUODENOSCOPY N/A 06/25/2015   Procedure: ESOPHAGOGASTRODUODENOSCOPY (EGD);  Surgeon: Scot Jun, MD;  Location: [facility] ENDOSCOPY;  Service: Endoscopy;  Laterality: N/A;   insertion of permanent spinal cord stimulator     insertion of trial spinal cord stimulator     LUMBAR DISC SURGERY     LUMBAR FUSION  4/05 and 8/07   LUMBAR MICRODISCECTOMY  03/28/03   L4-5 L5   MOUTH SURGERY     NASAL SEPTOPLASTY W/ TURBINOPLASTY     removal of spinal cord stimulator     SEPTOPLASTY     with reduction of turbinates   THYROIDECTOMY N/A 06/06/2016   Procedure: THYROIDECTOMY;  Surgeon: Earline Mayotte, MD;  Location: [facility] ORS;  Service: General;  Laterality: N/A;     Family Psychiatric History: Please see initial evaluation for full details. I have reviewed the history. No updates at this time.     Family History:  Family History  Problem Relation Age of Onset   Allergies Mother    Hypertension Mother    Arthritis Mother    Hyperlipidemia Mother    Stroke Mother    Hypertension Father    Arthritis Father    Diabetes Father    Hypertension Brother    Diabetes Brother    Arthritis Maternal Grandmother    Hyperlipidemia Maternal Grandmother    Hyperlipidemia Maternal Grandfather    Arthritis Paternal Grandmother    Arthritis Paternal Grandfather    Asthma Other        several family member   Leukemia Other        uncle   Lymphoma Maternal Aunt        non hodgkins    Social History:  Social History   Socioeconomic History   Marital status: Married    Spouse name: douglas   Number of children: 1   Years of education: 12 th   Highest education level: High school graduate  Occupational History   Occupation: Disabled    Employer: DISABLED  Tobacco Use   Smoking status: Never   Smokeless tobacco: Never  Vaping Use   Vaping status: Never Used  Substance and Sexual Activity   Alcohol use: No    Alcohol/week: 0.0 standard drinks of alcohol   Drug use: No   Sexual activity: Not Currently  Other Topics Concern   Not on file  Social History Narrative   ** Merged History Encounter **       Social Determinants of Health   Financial Resource Strain: Low Risk  (05/05/2020)   Overall Financial Resource Strain (CARDIA)    Difficulty of Paying Living Expenses: Not hard at all  Food Insecurity: No Food Insecurity (05/05/2020)   Hunger Vital Sign    Worried About Running Out of Food in the Last Year: Never true    Ran Out of Food in the Last Year: Never true  Transportation Needs: No Transportation Needs (05/05/2020)   PRAPARE - Administrator, Civil Service (Medical): No    Lack of Transportation (Non-Medical): No   Physical Activity: Inactive (07/17/2017)   Exercise Vital Sign    Days of Exercise per Week: 0 days    Minutes of Exercise per Session: 0 min  Stress: No Stress Concern Present (05/05/2020)   [employer] of Occupational  Health - Occupational Stress Questionnaire    Feeling of Stress : Not at all  Social Connections: Unknown (05/05/2020)   Social Connection and Isolation Panel [NHANES]    Frequency of Communication with Friends and Family: Not on file    Frequency of Social Gatherings with Friends and Family: Not on file    Attends Religious Services: Not on file    Active Member of Clubs or Organizations: Not on file    Attends Banker Meetings: Not on file    Marital Status: Married    Allergies:  Allergies  Allergen Reactions   Fentanyl Other (See Comments)    Other reaction(s): Headache, Other (See Comments) headache SEVERE HEADACHE headache   Sumatriptan Other (See Comments)    Other reaction(s): Headache Severe headache   Doxycycline Other (See Comments)    Other reaction(s): Headache, Other (See Comments) REACTION: causes severe abd pain REACTION: causes severe abd pain REACTION: causes severe abd pain   Montelukast Other (See Comments)    SEVERE HEADACHE   Oxymorphone Other (See Comments)    REACTION: severe headaches   Benzoin Rash   Buspirone Other (See Comments)    SEVERE HEADACHE   Ciprofloxacin Nausea Only   Conjugated Estrogens Other (See Comments)    headaches   Cymbalta [Duloxetine Hcl] Other (See Comments)    Headache   Duloxetine Other (See Comments)    Headache   Erythromycin Ethylsuccinate Other (See Comments)    Stomach ache   Estradiol Other (See Comments)    headache   Metronidazole Other (See Comments)    abd pain   Mirtazapine Other (See Comments)    headache    Metabolic Disorder Labs: Lab Results  Component Value Date   HGBA1C 5.4 05/28/2020   No results found for: "PROLACTIN" Lab Results  Component Value Date    CHOL 181 05/30/2022   TRIG 211.0 (H) 05/30/2022   HDL 43.10 05/30/2022   CHOLHDL 4 05/30/2022   VLDL 42.2 (H) 05/30/2022   LDLCALC 121 (H) 07/12/2016   LDLCALC 187 (H) 08/03/2015   Lab Results  Component Value Date   TSH 1.54 05/30/2022   TSH 0.480 09/06/2020    Therapeutic Level Labs: No results found for: "LITHIUM" No results found for: "VALPROATE" No results found for: "CBMZ"  Current Medications: Current Outpatient Medications  Medication Sig Dispense Refill   buPROPion (WELLBUTRIN XL) 150 MG 24 hr tablet Take 1 tablet (150 mg total) by mouth daily. 30 tablet 1   albuterol (VENTOLIN HFA) 108 (90 Base) MCG/ACT inhaler TAKE 2 PUFFS BY MOUTH EVERY 6 HOURS AS NEEDED FOR WHEEZE OR SHORTNESS OF BREATH 18 each 1   amLODipine (NORVASC) 5 MG tablet TAKE 1 TABLET BY MOUTH EVERY DAY 30 tablet 0   ascorbic acid (VITAMIN C) 500 MG tablet Take 1 tablet (500 mg total) by mouth daily. 30 tablet 0   bisacodyl (DULCOLAX) 5 MG EC tablet Take 5 mg by mouth daily as needed for moderate constipation.     diazepam (VALIUM) 5 MG tablet Take 5 mg by mouth 3 (three) times daily as needed for anxiety.     docusate sodium (COLACE) 100 MG capsule Take 100 mg by mouth 2 (two) times daily.     Fluticasone-Umeclidin-Vilant (TRELEGY ELLIPTA) 100-62.5-25 MCG/ACT AEPB INHALE 1 PUFF BY MOUTH EVERY DAY 60 each 1   gabapentin (NEURONTIN) 600 MG tablet Take 600 mg by mouth 3 (three) times daily.     GRALISE 600 MG TABS Take 600 mg  by mouth 3 (three) times daily.     hydrochlorothiazide (HYDRODIURIL) 25 MG tablet Take 1 tablet (25 mg total) by mouth daily. (Patient not taking: Reported on 11/28/2022) 30 tablet 1   metoprolol tartrate (LOPRESSOR) 50 MG tablet TAKE 1/2 TABLET BY MOUTH TWICE A DAY (NEED OFFICE VISIT) 90 tablet 0   Multiple Vitamin (MULTIVITAMIN) capsule Take 1 capsule by mouth daily.     mupirocin ointment (BACTROBAN) 2 % Apply 1 Application topically 2 (two) times daily. 22 g 0   Omega-3 Fatty Acids  (FISH OIL) 1200 MG CAPS Take 1 capsule by mouth daily.     ondansetron (ZOFRAN) 4 MG tablet Take 1 tablet (4 mg total) by mouth every 8 (eight) hours as needed. 40 tablet 0   Oxycodone HCl 10 MG TABS Take 10 mg by mouth 3 (three) times daily as needed (breakthrough pain).     pantoprazole (PROTONIX) 40 MG tablet Take 1 tablet (40 mg total) by mouth daily. Take 30 minutes before breakfast 30 tablet 3   potassium chloride (KLOR-CON M10) 10 MEQ tablet TAKE ONE TABLET 2 TIMES A DAY, FOR 2 DAYS, THEN CONTINUE TO TAKE 1 TABLET BY MOUTH EVERY DAY 90 tablet 1   pravastatin (PRAVACHOL) 20 MG tablet TAKE 1 TABLET BY MOUTH EVERY DAY 90 tablet 0   predniSONE (DELTASONE) 10 MG tablet Take 4 tablets x 1 day then decrease by 1/2 tablet per day until down to zero mg. 18 tablet 0   sodium chloride (OCEAN) 0.65 % SOLN nasal spray Place 2 sprays into both nostrils daily as needed for congestion. 30 mL 2   TIROSINT 137 MCG CAPS TAKE 1 CAPSULE BY MOUTH EVERY DAY BEFORE BREAKFAST 90 capsule 0   [START ON 06/13/2023] venlafaxine XR (EFFEXOR-XR) 150 MG 24 hr capsule Take 1 capsule (150 mg total) by mouth daily. Take total of 225 mg daily. Take along with 75 mg cap 30 capsule 1   [START ON 06/13/2023] venlafaxine XR (EFFEXOR-XR) 75 MG 24 hr capsule Take 1 capsule (75 mg total) by mouth daily. Take total of 225 mg daily. Take along with 150 mg cap 30 capsule 1   XTAMPZA ER 36 MG C12A Take 36 mg by mouth every 12 (twelve) hours.  0   zinc sulfate 220 (50 Zn) MG capsule Take 1 capsule (220 mg total) by mouth daily. 30 capsule 0   zolpidem (AMBIEN) 10 MG tablet Take 10 mg by mouth at bedtime as needed for sleep.     No current facility-administered medications for this visit.     Musculoskeletal: Strength & Muscle Tone: within normal limits Gait & Station:  uses a walker Patient leans: N/A  Psychiatric Specialty Exam: Review of Systems  Psychiatric/Behavioral:  Positive for dysphoric mood, sleep disturbance and  suicidal ideas. Negative for agitation, behavioral problems, confusion, decreased concentration, hallucinations and self-injury. The patient is nervous/anxious. The patient is not hyperactive.   All other systems reviewed and are negative.   Blood pressure 122/80, pulse 69, temperature (!) 97 F (36.1 C), temperature source Skin, height 5\' 7"  (1.702 m), weight 186 lb 6.4 oz (84.6 kg).Body mass index is 29.19 kg/m.  General Appearance: Well Groomed  Eye Contact:  Good  Speech:  Clear and Coherent  Volume:  Normal  Mood:   depressed  Affect:  Appropriate, Congruent, and Full Range  Thought Process:  Coherent  Orientation:  Full (Time, Place, and Person)  Thought Content: Logical   Suicidal Thoughts:  No  Homicidal Thoughts:  No  Memory:  Immediate;   Good  Judgement:  Good  Insight:  Good  Psychomotor Activity:  Normal  Concentration:  Concentration: Good and Attention Span: Good  Recall:  Good  Fund of Knowledge: Good  Language: Good  Akathisia:  No  Handed:  Right  AIMS (if indicated): not done  Assets:  Communication Skills Desire for Improvement  ADL's:  Intact  Cognition: WNL  Sleep:  Fair   Screenings: GAD-7    Flowsheet Row Office Visit from [facility]/16/2023 in [facility]  Total GAD-7 Score 13      PHQ2-9    Flowsheet Row Office Visit from 05/16/2023 in [facility] Office Visit from 11/28/2022 in [facility] Office Visit from 05/30/2022 in [facility] Video Visit from 08/26/2021 in [facility] Office Visit from 02/23/2021 in [facility] Total Score 4 6 1 2 4   PHQ-9 Total Score 14 19 -- 14 12      Flowsheet Row Office Visit from 11/28/2022 in [facility] Video Visit from 08/26/2021 in  [facility] Video Visit from 01/14/2021 in [facility]  C-SSRS RISK CATEGORY Error: Q3, 4, or 5 should not be populated when Q2 is No Error: Q3, 4, or 5 should not be populated when Q2 is No Low Risk        Assessment and Plan:  [name] is a 62 y.o. year old female with a history of depression, anxiety,  HTN, HL, dysphasia, asthma. migraine, sleep apnea (not able to use cpap machine), s/p Covid pneumonia in 08/2020, who presents for follow up appointment for below.    1. MDD (major depressive disorder), recurrent episode, mild (HCC) Acute stressors include: loss of her father with dementia, CHF in April 2024 Other stressors include: loss of her mother in June 2021, neck through back pain    History:   She experiences depressive symptoms and anxiety since the last visit.  Will start bupropion as adjunctive treatment for depression.  She has no known history of seizure.  Discussed potential risk of headache.   2. Restless leg syndrome 3. 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   4. Insomnia, unspecified type -She has a history of sleep apnea, although she has not used CPAP machine for many years.   Although she reports overall improvement in insomnia, she continues to experience fatigue.  Will make referral for re-evaluation of sleep apnea   Plan Continue venlafaxine 225 mg daily Start bupropion 150 mg daily  Referral for sleep evaluation send a reminder Next appointment: 11/25 at 3 PM Obtain lab (iron panels)- [place] - plan to recheck ferritin at the next visit - on Ambien 10 mg at night, valium 5 mg TID for muscle spasm - gabapentin 600 mg TID for nerve pain - iron pill   Past trials- duloxetine (did not like), nortriptyline (VH), Abilify, quetiapine, (she cannot recall other  medication trials)    The patient demonstrates the following risk factors for suicide: Chronic risk factors for suicide include: psychiatric disorder of depression. Acute risk factors for suicide include: unemployment. Protective factors for this patient include: positive social support, coping skills and hope for the future. Considering these factors, the overall  suicide risk at this point appears to be low. Patient is appropriate for outpatient follow up.      Collaboration of Care: Collaboration of Care: Other reviewed notes in Epic  Patient/Guardian was advised Release of Information must be obtained prior to any record release in order to collaborate their care with an outside provider. Patient/Guardian was advised if they have not already done so to contact the registration department to sign all necessary forms in order for [place] to release information regarding their care.   Consent: Patient/Guardian gives verbal consent for treatment and assignment of benefits for services provided during this visit. Patient/Guardian expressed understanding and agreed to proceed.    Neysa Hotter, MD 05/16/2023, 3:14 PM

## 2023-05-16 ENCOUNTER — Other Ambulatory Visit
Admission: RE | Admit: 2023-05-16 | Discharge: 2023-05-16 | Disposition: A | Discharge status: Home / Self Care | Payer: Medicare Other | Source: Ambulatory Visit | Attending: Psychiatry | Admitting: Psychiatry

## 2023-05-16 ENCOUNTER — Ambulatory Visit (INDEPENDENT_AMBULATORY_CARE_PROVIDER_SITE_OTHER): Payer: Medicare Other | Admitting: Psychiatry

## 2023-05-16 ENCOUNTER — Encounter: Payer: Self-pay | Admitting: Psychiatry

## 2023-05-16 VITALS — BP 122/80 | HR 69 | Temp 97.0°F | Ht 67.0 in | Wt 186.4 lb

## 2023-05-16 DIAGNOSIS — G4700 Insomnia, unspecified: Secondary | ICD-10-CM | POA: Diagnosis not present

## 2023-05-16 DIAGNOSIS — G2581 Restless legs syndrome: Secondary | ICD-10-CM

## 2023-05-16 DIAGNOSIS — E611 Iron deficiency: Secondary | ICD-10-CM | POA: Insufficient documentation

## 2023-05-16 DIAGNOSIS — F330 Major depressive disorder, recurrent, mild: Secondary | ICD-10-CM | POA: Diagnosis not present

## 2023-05-16 LAB — IRON AND TIBC
Iron: 64 ug/dL (ref 28–170)
Saturation Ratios: 23 % (ref 10.4–31.8)
TIBC: 273 ug/dL (ref 250–450)
UIBC: 209 ug/dL

## 2023-05-16 LAB — FERRITIN: Ferritin: 190 ng/mL (ref 11–307)

## 2023-05-16 MED ORDER — VENLAFAXINE HCL ER 150 MG PO CP24: 150.0000 mg | ORAL_CAPSULE | Freq: Every day | ORAL | 1 refills | Status: DC

## 2023-05-16 MED ORDER — BUPROPION HCL ER (XL) 150 MG PO TB24: 150.0000 mg | ORAL_TABLET | Freq: Every day | ORAL | 1 refills | Status: DC

## 2023-05-16 MED ORDER — VENLAFAXINE HCL ER 75 MG PO CP24: 75.0000 mg | ORAL_CAPSULE | Freq: Every day | ORAL | 1 refills | Status: DC

## 2023-05-16 NOTE — Patient Instructions (Signed)
Continue venlafaxine 225 mg daily Start bupropion 150 mg daily  Next appointment: 11/25 at 3 PM Obtain lab (iron panels)

## 2023-05-31 ENCOUNTER — Telehealth: Payer: Self-pay | Admitting: Internal Medicine

## 2023-05-31 NOTE — Telephone Encounter (Signed)
Received notification that she is overdue a mammogram and overdue f/u with me.  Needs to scheduled.

## 2023-06-01 NOTE — Telephone Encounter (Signed)
Scheduled for 10/31. Will set up mammo at appt

## 2023-06-02 ENCOUNTER — Other Ambulatory Visit: Payer: Self-pay | Admitting: Internal Medicine

## 2023-06-02 DIAGNOSIS — J45901 Unspecified asthma with (acute) exacerbation: Secondary | ICD-10-CM

## 2023-06-04 ENCOUNTER — Other Ambulatory Visit: Payer: Self-pay | Admitting: Internal Medicine

## 2023-06-05 ENCOUNTER — Other Ambulatory Visit: Payer: Self-pay

## 2023-06-05 ENCOUNTER — Other Ambulatory Visit: Payer: Self-pay | Admitting: Internal Medicine

## 2023-06-05 ENCOUNTER — Telehealth: Payer: Self-pay

## 2023-06-05 MED ORDER — TIROSINT 137 MCG PO CAPS: ORAL_CAPSULE | ORAL | 0 refills | Status: DC

## 2023-06-05 NOTE — Telephone Encounter (Signed)
Prescription Request  06/05/2023  LOV: Visit date not found  What is the name of the medication or equipment? TIROSINT 137 MCG CAPS  Have you contacted your pharmacy to request a refill? Yes   Which pharmacy would you like this sent to?  [id number] [address] [address], [place] - [name] [place] [address] Phone: [phone] Fax: [phone]    Patient notified that their request is being sent to the clinical staff for review and that they should receive a response within 2 business days.   Please advise at Mobile [phone] (mobile)  Patient has been out of this medication for two days.  [name] states his pharmacy has sent it to [place] twice.

## 2023-06-05 NOTE — Telephone Encounter (Signed)
Refill sent.

## 2023-06-05 NOTE — Telephone Encounter (Signed)
Has an appt with you on 10/31. Ok to fill for 30 day supply? Pt husband is aware must keep appt.

## 2023-06-05 NOTE — Telephone Encounter (Signed)
Ok

## 2023-06-06 NOTE — Telephone Encounter (Signed)
noted 

## 2023-06-06 NOTE — Telephone Encounter (Signed)
Patient's husband, Young Olszewski, called to follow-up on refill refill request for  TIROSINT 137 MCG CAPS.  I let Doug know that Rita Ohara, LPN, sent the refill to his pharmacy on 06/05/2023 at 3:09pm.

## 2023-06-08 ENCOUNTER — Other Ambulatory Visit: Payer: Self-pay | Admitting: Psychiatry

## 2023-06-08 DIAGNOSIS — M47817 Spondylosis without myelopathy or radiculopathy, lumbosacral region: Secondary | ICD-10-CM | POA: Diagnosis not present

## 2023-06-08 DIAGNOSIS — G894 Chronic pain syndrome: Secondary | ICD-10-CM | POA: Diagnosis not present

## 2023-06-08 DIAGNOSIS — Z79891 Long term (current) use of opiate analgesic: Secondary | ICD-10-CM | POA: Diagnosis not present

## 2023-06-08 DIAGNOSIS — M62830 Muscle spasm of back: Secondary | ICD-10-CM | POA: Diagnosis not present

## 2023-06-08 DIAGNOSIS — G4700 Insomnia, unspecified: Secondary | ICD-10-CM | POA: Diagnosis not present

## 2023-06-15 ENCOUNTER — Ambulatory Visit: Payer: Medicare Other | Admitting: Internal Medicine

## 2023-06-15 VITALS — BP 136/74 | HR 74 | Temp 97.9°F | Resp 16 | Ht 67.0 in | Wt 186.4 lb

## 2023-06-15 DIAGNOSIS — R079 Chest pain, unspecified: Secondary | ICD-10-CM

## 2023-06-15 DIAGNOSIS — F3341 Major depressive disorder, recurrent, in partial remission: Secondary | ICD-10-CM

## 2023-06-15 DIAGNOSIS — R1310 Dysphagia, unspecified: Secondary | ICD-10-CM

## 2023-06-15 DIAGNOSIS — Z1231 Encounter for screening mammogram for malignant neoplasm of breast: Secondary | ICD-10-CM

## 2023-06-15 DIAGNOSIS — I10 Essential (primary) hypertension: Secondary | ICD-10-CM

## 2023-06-15 DIAGNOSIS — E876 Hypokalemia: Secondary | ICD-10-CM | POA: Diagnosis not present

## 2023-06-15 DIAGNOSIS — Z23 Encounter for immunization: Secondary | ICD-10-CM

## 2023-06-15 DIAGNOSIS — Z860100 Personal history of colon polyps, unspecified: Secondary | ICD-10-CM

## 2023-06-15 DIAGNOSIS — E039 Hypothyroidism, unspecified: Secondary | ICD-10-CM

## 2023-06-15 DIAGNOSIS — K219 Gastro-esophageal reflux disease without esophagitis: Secondary | ICD-10-CM | POA: Diagnosis not present

## 2023-06-15 DIAGNOSIS — G4733 Obstructive sleep apnea (adult) (pediatric): Secondary | ICD-10-CM | POA: Diagnosis not present

## 2023-06-15 DIAGNOSIS — R0602 Shortness of breath: Secondary | ICD-10-CM

## 2023-06-15 DIAGNOSIS — E7800 Pure hypercholesterolemia, unspecified: Secondary | ICD-10-CM | POA: Diagnosis not present

## 2023-06-15 DIAGNOSIS — J4520 Mild intermittent asthma, uncomplicated: Secondary | ICD-10-CM

## 2023-06-15 MED ORDER — PANTOPRAZOLE SODIUM 40 MG PO TBEC: 40.0000 mg | DELAYED_RELEASE_TABLET | Freq: Every day | ORAL | 1 refills | Status: DC

## 2023-06-15 MED ORDER — PRAVASTATIN SODIUM 20 MG PO TABS: ORAL_TABLET | ORAL | 1 refills | Status: DC

## 2023-06-15 MED ORDER — HYDROCHLOROTHIAZIDE 12.5 MG PO TABS: 12.5000 mg | ORAL_TABLET | Freq: Every day | ORAL | 1 refills | Status: DC

## 2023-06-16 LAB — BASIC METABOLIC PANEL
BUN: 15 mg/dL (ref 6–23)
CO2: 30 meq/L (ref 19–32)
Calcium: 8.7 mg/dL (ref 8.4–10.5)
Chloride: 102 meq/L (ref 96–112)
Creatinine, Ser: 0.93 mg/dL (ref 0.40–1.20)
GFR: 65.05 mL/min (ref >60.00)
Glucose, Bld: 78 mg/dL (ref 70–99)
Potassium: 3.5 meq/L (ref 3.5–5.1)
Sodium: 145 meq/L (ref 135–145)

## 2023-06-16 LAB — HEPATIC FUNCTION PANEL
ALT: 11 U/L (ref 0–35)
AST: 16 U/L (ref 0–37)
Albumin: 3.8 g/dL (ref 3.5–5.2)
Alkaline Phosphatase: 79 U/L (ref 39–117)
Bilirubin, Direct: 0.1 mg/dL (ref 0.0–0.3)
Total Bilirubin: 0.6 mg/dL (ref 0.2–1.2)
Total Protein: 6.4 g/dL (ref 6.0–8.3)

## 2023-06-16 LAB — CBC WITH DIFFERENTIAL/PLATELET
Basophils Absolute: 0.1 10*3/uL (ref 0.0–0.1)
Basophils Relative: 0.7 % (ref 0.0–3.0)
Eosinophils Absolute: 1 10*3/uL — ABNORMAL HIGH (ref 0.0–0.7)
Eosinophils Relative: 12.4 % — ABNORMAL HIGH (ref 0.0–5.0)
HCT: 43.3 % (ref 36.0–46.0)
Hemoglobin: 14.5 g/dL (ref 12.0–15.0)
Lymphocytes Relative: 24 % (ref 12.0–46.0)
Lymphs Abs: 2 10*3/uL (ref 0.7–4.0)
MCHC: 33.5 g/dL (ref 30.0–36.0)
MCV: 91.4 fL (ref 78.0–100.0)
Monocytes Absolute: 0.5 10*3/uL (ref 0.1–1.0)
Monocytes Relative: 6 % (ref 3.0–12.0)
Neutro Abs: 4.7 10*3/uL (ref 1.4–7.7)
Neutrophils Relative %: 56.9 % (ref 43.0–77.0)
Platelets: 263 10*3/uL (ref 150.0–400.0)
RBC: 4.73 Mil/uL (ref 3.87–5.11)
RDW: 13.3 % (ref 11.5–15.5)
WBC: 8.2 10*3/uL (ref 4.0–10.5)

## 2023-06-16 LAB — LIPID PANEL
Cholesterol: 228 mg/dL — ABNORMAL HIGH (ref 0–200)
HDL: 50.8 mg/dL (ref >39.00)
LDL Cholesterol: 142 mg/dL — ABNORMAL HIGH (ref 0–99)
NonHDL: 177.04
Total CHOL/HDL Ratio: 4
Triglycerides: 177 mg/dL — ABNORMAL HIGH (ref 0.0–149.0)
VLDL: 35.4 mg/dL (ref 0.0–40.0)

## 2023-06-16 LAB — TSH: TSH: 0.28 u[IU]/mL — ABNORMAL LOW (ref 0.35–5.50)

## 2023-06-17 ENCOUNTER — Encounter: Payer: Self-pay | Admitting: Internal Medicine

## 2023-06-17 NOTE — Assessment & Plan Note (Signed)
On tirosant.  Follow tsh.  

## 2023-06-17 NOTE — Assessment & Plan Note (Signed)
PPI.  Describes dysphagia.  Discussed the need for GI evaluation and question of need for EGD.

## 2023-06-17 NOTE — Assessment & Plan Note (Signed)
Describes the chest pain as outlined.  Also sob.  Restart trelegy.  Has albuterol inhaler if needed. EKG - SR - no acute ischemic changes.  Discussed given symptoms and risk factors, need for cardiac evaluation.

## 2023-06-17 NOTE — Assessment & Plan Note (Signed)
Low cholesterol diet and exercise.  Pravastatin.  Follow lipid panel and liver function tests.

## 2023-06-17 NOTE — Assessment & Plan Note (Signed)
Restart trelegy.  Rescue inhaler prn.

## 2023-06-17 NOTE — Assessment & Plan Note (Signed)
Blood pressure as outlined.  Continue amlodipine. Restart hydrochlorothiazide 12.5mg  q day.  Follow pressures.  Follow metabolic panel.

## 2023-06-17 NOTE — Assessment & Plan Note (Signed)
Per report, she is taking potassium.  Apparently off hydrochlorothiazide.  Recheck potassium today.

## 2023-06-17 NOTE — Assessment & Plan Note (Signed)
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  Recommended f/u in 06/2020.  overdue.  She request referral to [place].  Did not keep appt.  Will reschedule.

## 2023-06-17 NOTE — Assessment & Plan Note (Signed)
Has had cardiac w/up.  Has seen pulmonary.   Some increased sob. EKG as outlined.  Restart trelegy. Rescue inhaler if needed. Follow.

## 2023-06-17 NOTE — Assessment & Plan Note (Signed)
Continue amlodipine and metoprolol.  Blood pressure elevated on my check.  Restart hydrochlorothiazide 12.5mg  q day.   Follow pressures.  Follow metabolic panel.

## 2023-06-17 NOTE — Assessment & Plan Note (Signed)
Reports dysphagia.  Discussed taking small bites and chewing food well.  Refer to GI for question of need for EGD.  

## 2023-06-17 NOTE — Assessment & Plan Note (Signed)
Needs BiPAP.

## 2023-06-17 NOTE — Assessment & Plan Note (Signed)
Has been followed by psychiatry.  Continues on effexor.

## 2023-06-19 ENCOUNTER — Other Ambulatory Visit: Payer: Self-pay | Admitting: Internal Medicine

## 2023-06-19 DIAGNOSIS — E039 Hypothyroidism, unspecified: Secondary | ICD-10-CM

## 2023-06-23 ENCOUNTER — Other Ambulatory Visit (HOSPITAL_COMMUNITY): Payer: Self-pay

## 2023-06-23 ENCOUNTER — Telehealth: Payer: Self-pay

## 2023-06-23 NOTE — Telephone Encounter (Signed)
Pharmacy Patient Advocate Encounter   Received notification from CoverMyMeds that prior authorization for Pantoprazole Sodium 40MG  dr tablets is required/requested.   Insurance verification completed.   The patient is insured through [facility] .   Per test claim: PA required; PA started via CoverMyMeds. KEY [id number] . Waiting for clinical questions to populate.

## 2023-06-27 ENCOUNTER — Institutional Professional Consult (permissible substitution): Payer: Medicare Other | Admitting: Internal Medicine

## 2023-06-28 ENCOUNTER — Other Ambulatory Visit: Payer: Self-pay | Admitting: Internal Medicine

## 2023-07-03 ENCOUNTER — Other Ambulatory Visit: Payer: Self-pay | Admitting: Internal Medicine

## 2023-07-05 ENCOUNTER — Other Ambulatory Visit (HOSPITAL_COMMUNITY): Payer: Self-pay

## 2023-07-05 NOTE — Telephone Encounter (Signed)
PA expired, renewing PA, new CMM key: [id number], waiting on clinical questions to populate

## 2023-07-05 NOTE — Telephone Encounter (Signed)
Pharmacy Patient Advocate Encounter  Received notification from [facility] that Prior Authorization for Pantoprazole 40mg  dr tab has been APPROVED from 07/05/2023 to 07/04/2024. Ran test claim, Copay is $12.00. This test claim was processed through [facility]- copay amounts may vary at other pharmacies due to pharmacy/plan contracts, or as the patient moves through the different stages of their insurance plan.   PA #/Case ID/Reference #: [id number]

## 2023-07-05 NOTE — Telephone Encounter (Signed)
Answered and submitted clinical questions

## 2023-07-06 DIAGNOSIS — G894 Chronic pain syndrome: Secondary | ICD-10-CM | POA: Diagnosis not present

## 2023-07-06 DIAGNOSIS — M62830 Muscle spasm of back: Secondary | ICD-10-CM | POA: Diagnosis not present

## 2023-07-06 DIAGNOSIS — M47817 Spondylosis without myelopathy or radiculopathy, lumbosacral region: Secondary | ICD-10-CM | POA: Diagnosis not present

## 2023-07-06 DIAGNOSIS — G4700 Insomnia, unspecified: Secondary | ICD-10-CM | POA: Diagnosis not present

## 2023-07-07 NOTE — Progress Notes (Deleted)
BH MD/PA/NP OP Progress Note  07/07/2023 5:35 PM [name]  MRN:  [id number]  Chief Complaint: No chief complaint on file.  HPI: ***  Substance use   Tobacco Alcohol Other substances/  Current denies denies CBD Gummies  Past denies denies denies  Past Treatment            Employment: unemployed, disability due to back pain s/p six back surgery (used to work for medical transcription) Support: husband Household:  Husband, grandson (age 64. Moved in since age 72 to help patient and her husband for transportation) Marital status: married Number of children: 1 daughter ("really good' relationship) She has "wonderful" parents. Her mother deceased 02/12/20.   Visit Diagnosis: No diagnosis found.  Past Psychiatric History: Please see initial evaluation for full details. I have reviewed the history. No updates at this time.     Past Medical History:  Past Medical History:  Diagnosis Date   Allergic rhinitis    Anxiety    Arthralgia    Asthma    Bronchitis, chronic (HCC)    Chronic back pain    Collagenous colitis    followed by Dr Markham Jordan   COVID-19    09/03/20 with pneumonia   Depression    Diplopia 11/15/2013   Dysphagia    Endometriosis    Fibrocystic breast disease    GERD (gastroesophageal reflux disease)    History of colon polyps    Hyperlipidemia    Hypertension    Hypokalemia    IBS (irritable bowel syndrome)    Memory difficulty 11/15/2013   Migraine headache    Pneumonia 2017   x2   PONV (postoperative nausea and vomiting)    used patch   Sleep apnea    recommended CPAP   Ulcer disease    Urine incontinence     Past Surgical History:  Procedure Laterality Date   ABDOMINAL HYSTERECTOMY     endometriosis   BACK SURGERY     x6   BREAST BIOPSY Right    COLONOSCOPY WITH PROPOFOL N/A 06/25/2015   Procedure: COLONOSCOPY WITH PROPOFOL;  Surgeon: Scot Jun, MD;  Location: [facility] ENDOSCOPY;  Service: Endoscopy;  Laterality: N/A;   DILATION AND  CURETTAGE OF UTERUS     and hysteroscopy   ESOPHAGOGASTRODUODENOSCOPY N/A 06/25/2015   Procedure: ESOPHAGOGASTRODUODENOSCOPY (EGD);  Surgeon: Scot Jun, MD;  Location: [facility] ENDOSCOPY;  Service: Endoscopy;  Laterality: N/A;   insertion of permanent spinal cord stimulator     insertion of trial spinal cord stimulator     LUMBAR DISC SURGERY     LUMBAR FUSION  4/05 and 8/07   LUMBAR MICRODISCECTOMY  03/28/03   L4-5 L5   MOUTH SURGERY     NASAL SEPTOPLASTY W/ TURBINOPLASTY     removal of spinal cord stimulator     SEPTOPLASTY     with reduction of turbinates   THYROIDECTOMY N/A 06/06/2016   Procedure: THYROIDECTOMY;  Surgeon: Earline Mayotte, MD;  Location: [facility] ORS;  Service: General;  Laterality: N/A;    Family Psychiatric History: Please see initial evaluation for full details. I have reviewed the history. No updates at this time.     Family History:  Family History  Problem Relation Age of Onset   Allergies Mother    Hypertension Mother    Arthritis Mother    Hyperlipidemia Mother    Stroke Mother    Hypertension Father    Arthritis Father    Diabetes Father  Hypertension Brother    Diabetes Brother    Arthritis Maternal Grandmother    Hyperlipidemia Maternal Grandmother    Hyperlipidemia Maternal Grandfather    Arthritis Paternal Grandmother    Arthritis Paternal Grandfather    Asthma Other        several family member   Leukemia Other        uncle   Lymphoma Maternal Aunt        non hodgkins    Social History:  Social History   Socioeconomic History   Marital status: Married    Spouse name: douglas   Number of children: 1   Years of education: 12 th   Highest education level: High school graduate  Occupational History   Occupation: Disabled    Employer: DISABLED  Tobacco Use   Smoking status: Never   Smokeless tobacco: Never  Vaping Use   Vaping status: Never Used  Substance and Sexual Activity   Alcohol use: No    Alcohol/week: 0.0  standard drinks of alcohol   Drug use: No   Sexual activity: Not Currently  Other Topics Concern   Not on file  Social History Narrative   ** Merged History Encounter **       Social Determinants of Health   Financial Resource Strain: Low Risk  (05/05/2020)   Overall Financial Resource Strain (CARDIA)    Difficulty of Paying Living Expenses: Not hard at all  Food Insecurity: No Food Insecurity (05/05/2020)   Hunger Vital Sign    Worried About Running Out of Food in the Last Year: Never true    Ran Out of Food in the Last Year: Never true  Transportation Needs: No Transportation Needs (05/05/2020)   PRAPARE - Administrator, Civil Service (Medical): No    Lack of Transportation (Non-Medical): No  Physical Activity: Inactive (07/17/2017)   Exercise Vital Sign    Days of Exercise per Week: 0 days    Minutes of Exercise per Session: 0 min  Stress: No Stress Concern Present (05/05/2020)   [employer] of Occupational Health - Occupational Stress Questionnaire    Feeling of Stress : Not at all  Social Connections: Unknown (05/05/2020)   Social Connection and Isolation Panel [NHANES]    Frequency of Communication with Friends and Family: Not on file    Frequency of Social Gatherings with Friends and Family: Not on file    Attends Religious Services: Not on file    Active Member of Clubs or Organizations: Not on file    Attends Banker Meetings: Not on file    Marital Status: Married    Allergies:  Allergies  Allergen Reactions   Fentanyl Other (See Comments)    Other reaction(s): Headache, Other (See Comments) headache SEVERE HEADACHE headache   Sumatriptan Other (See Comments)    Other reaction(s): Headache Severe headache   Doxycycline Other (See Comments)    Other reaction(s): Headache, Other (See Comments) REACTION: causes severe abd pain REACTION: causes severe abd pain REACTION: causes severe abd pain   Montelukast Other (See Comments)     SEVERE HEADACHE   Oxymorphone Other (See Comments)    REACTION: severe headaches   Benzoin Rash   Buspirone Other (See Comments)    SEVERE HEADACHE   Ciprofloxacin Nausea Only   Conjugated Estrogens Other (See Comments)    headaches   Cymbalta [Duloxetine Hcl] Other (See Comments)    Headache   Duloxetine Other (See Comments)    Headache  Erythromycin Ethylsuccinate Other (See Comments)    Stomach ache   Estradiol Other (See Comments)    headache   Metronidazole Other (See Comments)    abd pain   Mirtazapine Other (See Comments)    headache    Metabolic Disorder Labs: Lab Results  Component Value Date   HGBA1C 5.4 05/28/2020   No results found for: "PROLACTIN" Lab Results  Component Value Date   CHOL 228 (H) 06/15/2023   TRIG 177.0 (H) 06/15/2023   HDL 50.80 06/15/2023   CHOLHDL 4 06/15/2023   VLDL 35.4 06/15/2023   LDLCALC 142 (H) 06/15/2023   LDLCALC 121 (H) 07/12/2016   Lab Results  Component Value Date   TSH 0.28 (L) 06/15/2023   TSH 1.54 05/30/2022    Therapeutic Level Labs: No results found for: "LITHIUM" No results found for: "VALPROATE" No results found for: "CBMZ"  Current Medications: Current Outpatient Medications  Medication Sig Dispense Refill   albuterol (VENTOLIN HFA) 108 (90 Base) MCG/ACT inhaler TAKE 2 PUFFS BY MOUTH EVERY 6 HOURS AS NEEDED FOR WHEEZE OR SHORTNESS OF BREATH 18 each 1   amLODipine (NORVASC) 5 MG tablet TAKE 1 TABLET BY MOUTH EVERY DAY 90 tablet 0   ascorbic acid (VITAMIN C) 500 MG tablet Take 1 tablet (500 mg total) by mouth daily. 30 tablet 0   bisacodyl (DULCOLAX) 5 MG EC tablet Take 5 mg by mouth daily as needed for moderate constipation.     buPROPion (WELLBUTRIN XL) 150 MG 24 hr tablet Take 1 tablet (150 mg total) by mouth daily. 90 tablet 0   diazepam (VALIUM) 5 MG tablet Take 5 mg by mouth 3 (three) times daily as needed for anxiety.     docusate sodium (COLACE) 100 MG capsule Take 100 mg by mouth 2 (two) times daily.      gabapentin (NEURONTIN) 600 MG tablet Take 600 mg by mouth 3 (three) times daily.     GRALISE 600 MG TABS Take 600 mg by mouth 3 (three) times daily.     hydrochlorothiazide (HYDRODIURIL) 12.5 MG tablet Take 1 tablet (12.5 mg total) by mouth daily. 90 tablet 1   Levothyroxine Sodium (TIROSINT) 125 MCG CAPS Take 1 capsule (125 mcg total) by mouth daily before breakfast. 30 capsule 0   metoprolol tartrate (LOPRESSOR) 50 MG tablet TAKE 1/2 TABLET BY MOUTH TWICE A DAY (NEED OFFICE VISIT) 90 tablet 1   Multiple Vitamin (MULTIVITAMIN) capsule Take 1 capsule by mouth daily.     mupirocin ointment (BACTROBAN) 2 % Apply 1 Application topically 2 (two) times daily. 22 g 0   Omega-3 Fatty Acids (FISH OIL) 1200 MG CAPS Take 1 capsule by mouth daily.     ondansetron (ZOFRAN) 4 MG tablet Take 1 tablet (4 mg total) by mouth every 8 (eight) hours as needed. 40 tablet 0   Oxycodone HCl 10 MG TABS Take 10 mg by mouth 3 (three) times daily as needed (breakthrough pain).     pantoprazole (PROTONIX) 40 MG tablet Take 1 tablet (40 mg total) by mouth daily. Take 30 minutes before breakfast 90 tablet 1   potassium chloride (KLOR-CON M10) 10 MEQ tablet TAKE ONE TABLET 2 TIMES A DAY, FOR 2 DAYS, THEN CONTINUE TO TAKE 1 TABLET BY MOUTH EVERY DAY 90 tablet 1   pravastatin (PRAVACHOL) 20 MG tablet TAKE 1 TABLET BY MOUTH EVERY DAY 90 tablet 1   sodium chloride (OCEAN) 0.65 % SOLN nasal spray Place 2 sprays into both nostrils daily as needed for  congestion. 30 mL 2   TRELEGY ELLIPTA 100-62.5-25 MCG/ACT AEPB INHALE 1 PUFF BY MOUTH EVERY DAY 60 each 1   venlafaxine XR (EFFEXOR-XR) 150 MG 24 hr capsule Take 1 capsule (150 mg total) by mouth daily. Take total of 225 mg daily. Take along with 75 mg cap 30 capsule 1   venlafaxine XR (EFFEXOR-XR) 75 MG 24 hr capsule Take 1 capsule (75 mg total) by mouth daily. Take total of 225 mg daily. Take along with 150 mg cap 30 capsule 1   XTAMPZA ER 36 MG C12A Take 36 mg by mouth every 12  (twelve) hours.  0   zinc sulfate 220 (50 Zn) MG capsule Take 1 capsule (220 mg total) by mouth daily. 30 capsule 0   zolpidem (AMBIEN) 10 MG tablet Take 10 mg by mouth at bedtime as needed for sleep.     No current facility-administered medications for this visit.     Musculoskeletal: Strength & Muscle Tone:  normal Gait & Station: normal Patient leans: N/A  Psychiatric Specialty Exam: Review of Systems  There were no vitals taken for this visit.There is no height or weight on file to calculate BMI.  General Appearance: {Appearance:22683}  Eye Contact:  {B[facility] EYE CONTACT:22684}  Speech:  Clear and Coherent  Volume:  Normal  Mood:  {BHH MOOD:22306}  Affect:  {Affect (PAA):22687}  Thought Process:  Coherent  Orientation:  Full (Time, Place, and Person)  Thought Content: Logical   Suicidal Thoughts:  {ST/HT (PAA):22692}  Homicidal Thoughts:  {ST/HT (PAA):22692}  Memory:  Immediate;   Good  Judgement:  {Judgement (PAA):22694}  Insight:  {Insight (PAA):22695}  Psychomotor Activity:  Normal  Concentration:  Concentration: Good and Attention Span: Good  Recall:  Good  Fund of Knowledge: Good  Language: Good  Akathisia:  No  Handed:  Right  AIMS (if indicated): not done  Assets:  Communication Skills Desire for Improvement  ADL's:  Intact  Cognition: WNL  Sleep:  {B[facility] GOOD/FAIR/POOR:22877}   Screenings: GAD-7    Flowsheet Row Office Visit from 05/16/2023 in [facility]  Total GAD-7 Score 13      PHQ2-9    Flowsheet Row Office Visit from 06/15/2023 in [facility] [employer] Visit from 05/16/2023 in [facility] Office Visit from 11/28/2022 in [facility] Office Visit from 05/30/2022 in [facility] Video Visit from 08/26/2021 in [facility]   PHQ-2 Total Score 2 4 6 1 2   PHQ-9 Total Score 8 14 19  -- 14      Flowsheet Row Office Visit from 11/28/2022 in [facility] Video Visit from 08/26/2021 in [facility] Video Visit from 01/14/2021 in [facility]  C-SSRS RISK CATEGORY Error: Q3, 4, or 5 should not be populated when Q2 is No Error: Q3, 4, or 5 should not be populated when Q2 is No Low Risk        Assessment and Plan:  [name] is a 62 y.o. year old female with a history of depression, anxiety,  HTN, HL, dysphasia, asthma. migraine, sleep apnea (not able to use cpap machine), s/p Covid pneumonia in 08/2020, who presents for follow up appointment for below.      1. MDD (major depressive disorder), recurrent episode, mild (HCC) Acute stressors include: loss of her father with  dementia, CHF in April 2024 Other stressors include: loss of her mother in June 2021, neck through back pain    History:   She experiences depressive symptoms and anxiety since the last visit.  Will start bupropion as adjunctive treatment for depression.  She has no known history of seizure.  Discussed potential risk of headache.    2. Restless leg syndrome 3. 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    4. Insomnia, unspecified type -She has a history of sleep apnea, although she has not used CPAP machine for many years.   Although she reports overall improvement in insomnia, she continues to experience fatigue.  Will make referral for re-evaluation of sleep apnea   Plan Continue venlafaxine 225 mg daily Start bupropion 150 mg daily  Referral for sleep evaluation send a reminder Next appointment: 11/25 at 3 PM Obtain lab (iron panels)- Graceton - plan to recheck ferritin at the next visit - on Ambien 10 mg at  night, valium 5 mg TID for muscle spasm - gabapentin 600 mg TID for nerve pain - iron pill   Past trials- duloxetine (did not like), nortriptyline (VH), Abilify, quetiapine, (she cannot recall other medication trials)    The patient demonstrates the following risk factors for suicide: Chronic risk factors for suicide include: psychiatric disorder of depression. Acute risk factors for suicide include: unemployment. Protective factors for this patient include: positive social support, coping skills and hope for the future. Considering these factors, the overall suicide risk at this point appears to be low. Patient is appropriate for outpatient follow up.      Collaboration of Care: Collaboration of Care: {BH OP Collaboration of Care:21014065}  Patient/Guardian was advised Release of Information must be obtained prior to any record release in order to collaborate their care with an outside provider. Patient/Guardian was advised if they have not already done so to contact the registration department to sign all necessary forms in order for [place] to release information regarding their care.   Consent: Patient/Guardian gives verbal consent for treatment and assignment of benefits for services provided during this visit. Patient/Guardian expressed understanding and agreed to proceed.    Neysa Hotter, MD 07/07/2023, 5:35 PM

## 2023-07-10 ENCOUNTER — Ambulatory Visit: Payer: Medicare Other | Admitting: Psychiatry

## 2023-07-14 ENCOUNTER — Other Ambulatory Visit: Payer: Self-pay | Admitting: Psychiatry

## 2023-07-14 NOTE — Telephone Encounter (Signed)
Please contact to schedule 30 mins follow up appointment. thanks

## 2023-07-16 ENCOUNTER — Other Ambulatory Visit: Payer: Self-pay | Admitting: Internal Medicine

## 2023-07-16 DIAGNOSIS — E039 Hypothyroidism, unspecified: Secondary | ICD-10-CM

## 2023-07-17 ENCOUNTER — Telehealth: Payer: Self-pay | Admitting: Internal Medicine

## 2023-07-17 NOTE — Telephone Encounter (Signed)
Received notification she is overdue a mammogram. Please schedule.

## 2023-07-18 NOTE — Telephone Encounter (Signed)
Left detailed message for husband. 

## 2023-07-18 NOTE — Telephone Encounter (Signed)
Patient cancelled appt on 07/10/23 due to hurt back. Message left today, 07/18/23 to call and schedule appointment.

## 2023-07-26 ENCOUNTER — Other Ambulatory Visit: Payer: Self-pay | Admitting: Internal Medicine

## 2023-07-26 ENCOUNTER — Other Ambulatory Visit: Payer: Self-pay

## 2023-07-26 ENCOUNTER — Telehealth: Payer: Self-pay | Admitting: Internal Medicine

## 2023-07-26 DIAGNOSIS — I10 Essential (primary) hypertension: Secondary | ICD-10-CM

## 2023-07-26 DIAGNOSIS — Z1231 Encounter for screening mammogram for malignant neoplasm of breast: Secondary | ICD-10-CM

## 2023-07-26 DIAGNOSIS — E039 Hypothyroidism, unspecified: Secondary | ICD-10-CM

## 2023-07-26 NOTE — Progress Notes (Signed)
Order placed for f/u mammogram.  

## 2023-07-26 NOTE — Telephone Encounter (Signed)
CBC AND TSH ORDERED.

## 2023-07-26 NOTE — Telephone Encounter (Signed)
Patient need lab orders.

## 2023-07-31 ENCOUNTER — Telehealth: Payer: Self-pay

## 2023-07-31 ENCOUNTER — Other Ambulatory Visit: Payer: Medicare Other

## 2023-07-31 NOTE — Telephone Encounter (Signed)
Patient's husband, Kilani Harkema, called to state patient fell Saturday morning.  Gala Romney states patient fell coming out of the bathroom.  Gala Romney states patient was walking on hardwood floors with socks and no shoes.  Gala Romney states patient did not hit her head or break anything, she is just sore.  Gala Romney states she won't be able to make it to her lab visit today.  I rescheduled visit for patient.

## 2023-07-31 NOTE — Telephone Encounter (Signed)
FYI- Called patients husband to follow up. She did not get hurt. Did not hit her head. No acute symptoms. Did not feel she needed to be seen. Husband has rescheduled her lab appt and was advised to let us know if they need anything.

## 2023-08-10 ENCOUNTER — Other Ambulatory Visit: Payer: Self-pay | Admitting: Internal Medicine

## 2023-08-10 DIAGNOSIS — J45901 Unspecified asthma with (acute) exacerbation: Secondary | ICD-10-CM

## 2023-08-14 ENCOUNTER — Other Ambulatory Visit: Payer: Medicare Other

## 2023-08-17 ENCOUNTER — Other Ambulatory Visit: Payer: Self-pay | Admitting: Psychiatry

## 2023-08-17 ENCOUNTER — Other Ambulatory Visit: Payer: Medicare Other

## 2023-08-22 ENCOUNTER — Other Ambulatory Visit (INDEPENDENT_AMBULATORY_CARE_PROVIDER_SITE_OTHER): Payer: Medicare Other

## 2023-08-22 DIAGNOSIS — I10 Essential (primary) hypertension: Secondary | ICD-10-CM

## 2023-08-22 DIAGNOSIS — E039 Hypothyroidism, unspecified: Secondary | ICD-10-CM

## 2023-08-23 LAB — CBC WITH DIFFERENTIAL/PLATELET
Basophils Absolute: 0.1 10*3/uL (ref 0.0–0.1)
Basophils Relative: 1.2 % (ref 0.0–3.0)
Eosinophils Absolute: 0.6 10*3/uL (ref 0.0–0.7)
Eosinophils Relative: 8.7 % — ABNORMAL HIGH (ref 0.0–5.0)
HCT: 43 % (ref 36.0–46.0)
Hemoglobin: 14.3 g/dL (ref 12.0–15.0)
Lymphocytes Relative: 28.5 % (ref 12.0–46.0)
Lymphs Abs: 2 10*3/uL (ref 0.7–4.0)
MCHC: 33.4 g/dL (ref 30.0–36.0)
MCV: 97.1 fL (ref 78.0–100.0)
Monocytes Absolute: 0.6 10*3/uL (ref 0.1–1.0)
Monocytes Relative: 9 % (ref 3.0–12.0)
Neutro Abs: 3.7 10*3/uL (ref 1.4–7.7)
Neutrophils Relative %: 52.6 % (ref 43.0–77.0)
Platelets: 291 10*3/uL (ref 150.0–400.0)
RBC: 4.42 Mil/uL (ref 3.87–5.11)
RDW: 14 % (ref 11.5–15.5)
WBC: 7 10*3/uL (ref 4.0–10.5)

## 2023-08-23 LAB — TSH: TSH: 0.03 u[IU]/mL — ABNORMAL LOW (ref 0.35–5.50)

## 2023-08-29 ENCOUNTER — Institutional Professional Consult (permissible substitution): Payer: Medicare Other | Admitting: Internal Medicine

## 2023-09-05 ENCOUNTER — Other Ambulatory Visit: Payer: Self-pay | Admitting: Psychiatry

## 2023-09-10 ENCOUNTER — Other Ambulatory Visit: Payer: Self-pay | Admitting: Internal Medicine

## 2023-09-10 DIAGNOSIS — E7800 Pure hypercholesterolemia, unspecified: Secondary | ICD-10-CM

## 2023-09-12 ENCOUNTER — Ambulatory Visit (INDEPENDENT_AMBULATORY_CARE_PROVIDER_SITE_OTHER): Payer: Medicare Other | Admitting: Internal Medicine

## 2023-09-12 ENCOUNTER — Ambulatory Visit (INDEPENDENT_AMBULATORY_CARE_PROVIDER_SITE_OTHER): Payer: Medicare Other

## 2023-09-12 VITALS — BP 118/70 | HR 78 | Temp 98.0°F | Resp 16 | Ht 67.0 in | Wt 176.0 lb

## 2023-09-12 DIAGNOSIS — Z0000 Encounter for general adult medical examination without abnormal findings: Secondary | ICD-10-CM | POA: Diagnosis not present

## 2023-09-12 DIAGNOSIS — R053 Chronic cough: Secondary | ICD-10-CM | POA: Diagnosis not present

## 2023-09-12 DIAGNOSIS — E7800 Pure hypercholesterolemia, unspecified: Secondary | ICD-10-CM | POA: Diagnosis not present

## 2023-09-12 DIAGNOSIS — J4520 Mild intermittent asthma, uncomplicated: Secondary | ICD-10-CM

## 2023-09-12 DIAGNOSIS — E039 Hypothyroidism, unspecified: Secondary | ICD-10-CM | POA: Diagnosis not present

## 2023-09-12 DIAGNOSIS — K219 Gastro-esophageal reflux disease without esophagitis: Secondary | ICD-10-CM

## 2023-09-12 DIAGNOSIS — Z860100 Personal history of colon polyps, unspecified: Secondary | ICD-10-CM

## 2023-09-12 DIAGNOSIS — I10 Essential (primary) hypertension: Secondary | ICD-10-CM

## 2023-09-12 DIAGNOSIS — F3341 Major depressive disorder, recurrent, in partial remission: Secondary | ICD-10-CM

## 2023-09-12 DIAGNOSIS — M3313 Other dermatomyositis without myopathy: Secondary | ICD-10-CM

## 2023-09-12 MED ORDER — PREDNISONE 10 MG PO TABS: ORAL_TABLET | ORAL | 0 refills | Status: AC

## 2023-09-12 MED ORDER — CEFDINIR 300 MG PO CAPS: 300.0000 mg | ORAL_CAPSULE | Freq: Two times a day (BID) | ORAL | 0 refills | Status: AC

## 2023-09-12 MED ORDER — LEVOTHYROXINE SODIUM 100 MCG PO TABS: 100.0000 ug | ORAL_TABLET | Freq: Every day | ORAL | 3 refills | Status: DC

## 2023-09-12 NOTE — Progress Notes (Signed)
Subjective:    Patient ID: [name], female    DOB: 06-03-1961, 63 y.o.   MRN: [id number]  Patient here for  Chief Complaint  Patient presents with   Medical Management of Chronic Issues    HPI Here for a physical exam. She is accompanied by her husband. History obtained from both of them. Seeing psychiatry for depression and anxiety. Last seen 05/16/23. On wellbutrin now - started at 05/16/23 visit. Continues on effexor. Last visit, restarted hydrochlorothiazide. Continues amlodipine. Was referred to cardiology for evaluation of chest pain. Recent labs revealed suppressed TSH. Discussed adjusting her thyroid medication. Reports that over the last two weeks, she has had increased nasal congestion and productive cough. Also nasal lesions. Mucus is green. Some coughing fits. Some decreased appetite. No vomiting reported. Increased constipation.    Past Medical History:  Diagnosis Date   Allergic rhinitis    Anxiety    Arthralgia    Asthma    Bronchitis, chronic (HCC)    Chronic back pain    Collagenous colitis    followed by Dr Markham Jordan   COVID-19    09/03/20 with pneumonia   Depression    Diplopia 11/15/2013   Dysphagia    Endometriosis    Fibrocystic breast disease    GERD (gastroesophageal reflux disease)    History of colon polyps    Hyperlipidemia    Hypertension    Hypokalemia    IBS (irritable bowel syndrome)    Memory difficulty 11/15/2013   Migraine headache    Pneumonia 2017   x2   PONV (postoperative nausea and vomiting)    used patch   Sleep apnea    recommended CPAP   Ulcer disease    Urine incontinence    Past Surgical History:  Procedure Laterality Date   ABDOMINAL HYSTERECTOMY     endometriosis   BACK SURGERY     x6   BREAST BIOPSY Right    COLONOSCOPY WITH PROPOFOL N/A 06/25/2015   Procedure: COLONOSCOPY WITH PROPOFOL;  Surgeon: Scot Jun, MD;  Location: [facility] ENDOSCOPY;  Service: Endoscopy;  Laterality: N/A;   DILATION AND CURETTAGE OF  UTERUS     and hysteroscopy   ESOPHAGOGASTRODUODENOSCOPY N/A 06/25/2015   Procedure: ESOPHAGOGASTRODUODENOSCOPY (EGD);  Surgeon: Scot Jun, MD;  Location: [facility] ENDOSCOPY;  Service: Endoscopy;  Laterality: N/A;   insertion of permanent spinal cord stimulator     insertion of trial spinal cord stimulator     LUMBAR DISC SURGERY     LUMBAR FUSION  4/05 and 8/07   LUMBAR MICRODISCECTOMY  03/28/03   L4-5 L5   MOUTH SURGERY     NASAL SEPTOPLASTY W/ TURBINOPLASTY     removal of spinal cord stimulator     SEPTOPLASTY     with reduction of turbinates   THYROIDECTOMY N/A 06/06/2016   Procedure: THYROIDECTOMY;  Surgeon: Earline Mayotte, MD;  Location: [facility] ORS;  Service: General;  Laterality: N/A;   Family History  Problem Relation Age of Onset   Allergies Mother    Hypertension Mother    Arthritis Mother    Hyperlipidemia Mother    Stroke Mother    Hypertension Father    Arthritis Father    Diabetes Father    Hypertension Brother    Diabetes Brother    Arthritis Maternal Grandmother    Hyperlipidemia Maternal Grandmother    Hyperlipidemia Maternal Grandfather    Arthritis Paternal Grandmother    Arthritis Paternal Grandfather    Asthma Other  several family member   Leukemia Other        uncle   Lymphoma Maternal Aunt        non hodgkins   Social History   Socioeconomic History   Marital status: Married    Spouse name: douglas   Number of children: 1   Years of education: 12 th   Highest education level: High school graduate  Occupational History   Occupation: Disabled    Employer: DISABLED  Tobacco Use   Smoking status: Never   Smokeless tobacco: Never  Vaping Use   Vaping status: Never Used  Substance and Sexual Activity   Alcohol use: No    Alcohol/week: 0.0 standard drinks of alcohol   Drug use: No   Sexual activity: Not Currently  Other Topics Concern   Not on file  Social History Narrative   ** Merged History Encounter **       Social  Drivers of Health   Financial Resource Strain: Low Risk  (05/05/2020)   Overall Financial Resource Strain (CARDIA)    Difficulty of Paying Living Expenses: Not hard at all  Food Insecurity: No Food Insecurity (05/05/2020)   Hunger Vital Sign    Worried About Running Out of Food in the Last Year: Never true    Ran Out of Food in the Last Year: Never true  Transportation Needs: No Transportation Needs (05/05/2020)   PRAPARE - Administrator, Civil Service (Medical): No    Lack of Transportation (Non-Medical): No  Physical Activity: Inactive (07/17/2017)   Exercise Vital Sign    Days of Exercise per Week: 0 days    Minutes of Exercise per Session: 0 min  Stress: No Stress Concern Present (05/05/2020)   [employer] of Occupational Health - Occupational Stress Questionnaire    Feeling of Stress : Not at all  Social Connections: Unknown (05/05/2020)   Social Connection and Isolation Panel [NHANES]    Frequency of Communication with Friends and Family: Not on file    Frequency of Social Gatherings with Friends and Family: Not on file    Attends Religious Services: Not on file    Active Member of Clubs or Organizations: Not on file    Attends Banker Meetings: Not on file    Marital Status: Married     Review of Systems  Constitutional:        Decreased appetite. Decreased weight.   HENT:  Positive for congestion, postnasal drip and sinus pressure.   Respiratory:  Positive for cough. Negative for chest tightness.        Some wheezing.   Cardiovascular:  Negative for chest pain and palpitations.  Gastrointestinal:  Positive for constipation. Negative for abdominal pain, diarrhea, nausea and vomiting.  Genitourinary:  Negative for difficulty urinating and dysuria.  Musculoskeletal:  Negative for joint swelling and myalgias.  Skin:  Negative for color change and rash.  Neurological:  Negative for dizziness and headaches.  Psychiatric/Behavioral:  Negative for  agitation and dysphoric mood.        Objective:     BP 118/70   Pulse 78   Temp 98 F (36.7 C)   Resp 16   Ht 5\' 7"  (1.702 m)   Wt 176 lb (79.8 kg)   SpO2 98%   BMI 27.57 kg/m  Wt Readings from Last 3 Encounters:  09/12/23 176 lb (79.8 kg)  06/15/23 186 lb 6.4 oz (84.6 kg)  05/30/22 205 lb (93 kg)    Physical  Exam Vitals reviewed.  Constitutional:      General: She is not in acute distress.    Appearance: Normal appearance.  HENT:     Head: Normocephalic and atraumatic.     Right Ear: External ear normal.     Left Ear: External ear normal.  Eyes:     General: No scleral icterus.       Right eye: No discharge.        Left eye: No discharge.     Conjunctiva/sclera: Conjunctivae normal.  Neck:     Thyroid: No thyromegaly.  Cardiovascular:     Rate and Rhythm: Normal rate and regular rhythm.  Pulmonary:     Effort: No respiratory distress.     Breath sounds: Normal breath sounds. No wheezing.  Abdominal:     General: Bowel sounds are normal.     Palpations: Abdomen is soft.     Tenderness: There is no abdominal tenderness.  Musculoskeletal:        General: No swelling or tenderness.     Cervical back: Neck supple. No tenderness.  Lymphadenopathy:     Cervical: No cervical adenopathy.  Skin:    Findings: No erythema or rash.  Neurological:     Mental Status: She is alert.  Psychiatric:        Mood and Affect: Mood normal.        Behavior: Behavior normal.         Outpatient Encounter Medications as of 09/12/2023  Medication Sig   cefdinir (OMNICEF) 300 MG capsule Take 1 capsule (300 mg total) by mouth 2 (two) times daily.   gabapentin (NEURONTIN) 600 MG tablet Take 600 mg by mouth 3 (three) times daily.   levothyroxine (SYNTHROID) 100 MCG tablet Take 1 tablet (100 mcg total) by mouth daily.   predniSONE (DELTASONE) 10 MG tablet Take 6 tablets x 1 day and then decrease by 1/2 tablet per day until down to zero mg.   albuterol (VENTOLIN HFA) 108 (90  Base) MCG/ACT inhaler TAKE 2 PUFFS BY MOUTH EVERY 6 HOURS AS NEEDED FOR WHEEZE OR SHORTNESS OF BREATH   amLODipine (NORVASC) 5 MG tablet TAKE 1 TABLET BY MOUTH EVERY DAY   bisacodyl (DULCOLAX) 5 MG EC tablet Take 5 mg by mouth daily as needed for moderate constipation.   buPROPion (WELLBUTRIN XL) 150 MG 24 hr tablet Take 1 tablet (150 mg total) by mouth daily.   diazepam (VALIUM) 5 MG tablet Take 5 mg by mouth 3 (three) times daily as needed for anxiety.   docusate sodium (COLACE) 100 MG capsule Take 100 mg by mouth 2 (two) times daily.   hydrochlorothiazide (HYDRODIURIL) 12.5 MG tablet TAKE 1 TABLET BY MOUTH EVERY DAY   metoprolol tartrate (LOPRESSOR) 50 MG tablet TAKE 1/2 TABLET BY MOUTH TWICE A DAY (NEED OFFICE VISIT)   Multiple Vitamin (MULTIVITAMIN) capsule Take 1 capsule by mouth daily.   mupirocin ointment (BACTROBAN) 2 % Apply 1 Application topically 2 (two) times daily.   Oxycodone HCl 10 MG TABS Take 10 mg by mouth 3 (three) times daily as needed (breakthrough pain).   pravastatin (PRAVACHOL) 20 MG tablet TAKE 1 TABLET BY MOUTH EVERY DAY   sodium chloride (OCEAN) 0.65 % SOLN nasal spray Place 2 sprays into both nostrils daily as needed for congestion.   TRELEGY ELLIPTA 100-62.5-25 MCG/ACT AEPB INHALE 1 PUFF BY MOUTH EVERY DAY   venlafaxine XR (EFFEXOR-XR) 150 MG 24 hr capsule Take 1 capsule (150 mg total) by mouth daily. Take  total of 225 mg daily. Take along with 75 mg cap   venlafaxine XR (EFFEXOR-XR) 75 MG 24 hr capsule Take 1 capsule (75 mg total) by mouth daily. Take total of 225 mg daily. Take along with 150 mg cap   XTAMPZA ER 36 MG C12A Take 36 mg by mouth every 12 (twelve) hours.   zolpidem (AMBIEN) 10 MG tablet Take 10 mg by mouth at bedtime as needed for sleep.   [DISCONTINUED] ascorbic acid (VITAMIN C) 500 MG tablet Take 1 tablet (500 mg total) by mouth daily.   [DISCONTINUED] gabapentin (NEURONTIN) 600 MG tablet Take 600 mg by mouth 3 (three) times daily.   [DISCONTINUED]  GRALISE 600 MG TABS Take 600 mg by mouth 3 (three) times daily.   [DISCONTINUED] Levothyroxine Sodium 125 MCG CAPS TAKE 1 CAPSULE (125 MCG TOTAL) BY MOUTH DAILY BEFORE BREAKFAST.   [DISCONTINUED] mupirocin ointment (BACTROBAN) 2 % Apply 1 Application topically 2 (two) times daily.   [DISCONTINUED] Omega-3 Fatty Acids (FISH OIL) 1200 MG CAPS Take 1 capsule by mouth daily.   [DISCONTINUED] ondansetron (ZOFRAN) 4 MG tablet Take 1 tablet (4 mg total) by mouth every 8 (eight) hours as needed.   [DISCONTINUED] pantoprazole (PROTONIX) 40 MG tablet Take 1 tablet (40 mg total) by mouth daily. Take 30 minutes before breakfast   [DISCONTINUED] potassium chloride (KLOR-CON M10) 10 MEQ tablet TAKE ONE TABLET 2 TIMES A DAY, FOR 2 DAYS, THEN CONTINUE TO TAKE 1 TABLET BY MOUTH EVERY DAY   [DISCONTINUED] triamcinolone (NASACORT AQ) 55 MCG/ACT AERO nasal inhaler Place 2 sprays into the nose 2 (two) times daily. 1 sprays each nostril twice a day   [DISCONTINUED] zinc sulfate 220 (50 Zn) MG capsule Take 1 capsule (220 mg total) by mouth daily.   No facility-administered encounter medications on file as of 09/12/2023.     Lab Results  Component Value Date   WBC 7.0 08/22/2023   HGB 14.3 08/22/2023   HCT 43.0 08/22/2023   PLT 291.0 08/22/2023   GLUCOSE 78 06/15/2023   CHOL 228 (H) 06/15/2023   TRIG 177.0 (H) 06/15/2023   HDL 50.80 06/15/2023   LDLDIRECT 108.0 05/30/2022   LDLCALC 142 (H) 06/15/2023   ALT 11 06/15/2023   AST 16 06/15/2023   NA 145 06/15/2023   K 3.5 06/15/2023   CL 102 06/15/2023   CREATININE 0.93 06/15/2023   BUN 15 06/15/2023   CO2 30 06/15/2023   TSH 0.03 (L) 08/22/2023   HGBA1C 5.4 05/28/2020    No results found.     Assessment & Plan:  Health care maintenance Assessment & Plan: Physical today 09/12/23.  PAP 06/05/20. Due pap.  Colonoscopy 06/2015 as outlined.  Recommended f/u colonoscopy in 5 years.  Overdue. Schedule mammogram.  Overdue.    Hypercholesterolemia Assessment  & Plan: Low cholesterol diet and exercise.  Pravastatin.  Follow lipid panel and liver function tests.    Orders: -     Basic metabolic panel; Future -     Hepatic function panel; Future -     Lipid panel; Future  Hypothyroidism, unspecified type Assessment & Plan: On synthroid. Dose adjusted. Follow tsh.   Orders: -     TSH; Future  Persistent cough Assessment & Plan: Persistent cough and congestion as outlined. Increased sinus pressure. Continue inhalers. Using trelegy at night. Has rescue inhlaer prn. Given persistent symptoms and coughing fits, treat with prednisone taper as directed. Omnicef. Saline nasal spray and steroid nasal spray. Follow.  Call with update.  Orders: -     DG Chest 2 View; Future  MDD (major depressive disorder), recurrent, in partial remission [facility]) Assessment & Plan: Has been followed by psychiatry.  Continues on effexor and wellbutrin. Continue f/u with psychiatry.    History of colonic polyps Assessment & Plan: Colonoscopy 06/2015 - tubular adenoma x 2.  Recommended f/u in 06/2020.  overdue.  She request referral to [facility].  Did not keep appt.  Will reschedule. Also with constipation. Miralax/benefiber. Follow.    Gastroesophageal reflux disease, unspecified whether esophagitis present Assessment & Plan: PPI.  Described dysphagia.  Discussed the need for GI evaluation and question of need for EGD.    Essential hypertension, benign Assessment & Plan: Continue amlodipine and metoprolol and hydrochlorothiazide 12.5mg  q day.   Follow pressures.  Follow metabolic panel.    Dermatomyositis (HCC) Assessment & Plan: Previously diagnosed with dermatomyositis.  Evaluated by rheumatology.     Intermittent asthma without complication, unspecified asthma severity Assessment & Plan: Continue trelegy. Treat current infection. Has rescue inhaler if needed. Prednisone taper. Follow.    Other orders -     Levothyroxine Sodium; Take 1 tablet (100  mcg total) by mouth daily.  Dispense: 90 tablet; Refill: 3 -     Cefdinir; Take 1 capsule (300 mg total) by mouth 2 (two) times daily.  Dispense: 14 capsule; Refill: 0 -     predniSONE; Take 6 tablets x 1 day and then decrease by 1/2 tablet per day until down to zero mg.  Dispense: 39 tablet; Refill: 0 -     Mupirocin; Apply 1 Application topically 2 (two) times daily.  Dispense: 22 g; Refill: 0     Dale [place], MD

## 2023-09-12 NOTE — Assessment & Plan Note (Signed)
Physical today 09/12/23.  PAP 06/05/20. Due pap.  Colonoscopy 06/2015 as outlined.  Recommended f/u colonoscopy in 5 years.  Overdue. Schedule mammogram.  Overdue.

## 2023-09-12 NOTE — Patient Instructions (Signed)
Stop the synthroid   Start synthroid - one per day.

## 2023-09-13 MED ORDER — MUPIROCIN 2 % EX OINT: 1.0000 | TOPICAL_OINTMENT | Freq: Two times a day (BID) | CUTANEOUS | 0 refills | Status: AC

## 2023-09-14 NOTE — Telephone Encounter (Unsigned)
Copied from CRM [id number]. Topic: Clinical - Medication Question >> Sep 14, 2023  3:05 PM Gurney Maxin H wrote: Reason for CRM: Patient states when she was in the office today and spoke with provider about fever blisters on nose and mouth, provider was supposed to call in a prescription for some cream, pharmacy states no medication for patient on file. Medication can be sent to [facility] on file.  Denyce [phone]

## 2023-09-15 ENCOUNTER — Telehealth: Payer: Self-pay

## 2023-09-15 NOTE — Telephone Encounter (Signed)
Bactroban has been picked up.

## 2023-09-15 NOTE — Telephone Encounter (Signed)
 Copied from CRM [id number]. Topic: Clinical - Medication Question >> Sep 14, 2023  3:05 PM Gurney Maxin H wrote: Reason for CRM: Patient states when she was in the office today and spoke with provider about fever blisters on nose and mouth, provider was supposed to call in a prescription for some cream, pharmacy states no medication for patient on file. Medication [facility] be sent to [facility] on file.  Denyce [phone]

## 2023-09-15 NOTE — Telephone Encounter (Signed)
Let me know if you want me to send something in

## 2023-09-15 NOTE — Telephone Encounter (Signed)
Bactroban was sent in.

## 2023-09-17 ENCOUNTER — Encounter: Payer: Self-pay | Admitting: Internal Medicine

## 2023-09-17 ENCOUNTER — Telehealth: Payer: Self-pay | Admitting: Internal Medicine

## 2023-09-17 NOTE — Assessment & Plan Note (Addendum)
Continue trelegy. Treat current infection. Has rescue inhaler if needed. Prednisone taper. Follow.

## 2023-09-17 NOTE — Telephone Encounter (Signed)
Have placed referral to GI for dysphagia. She is also overdue colonoscopy. She is agreeable for referral. Do I need to place another order.  It appears 06/2023 referral has been authorized. Can you help with arranging an appt. Request [facility].  Thanks

## 2023-09-17 NOTE — Assessment & Plan Note (Signed)
Low cholesterol diet and exercise.  Pravastatin.  Follow lipid panel and liver function tests.

## 2023-09-17 NOTE — Assessment & Plan Note (Signed)
Persistent cough and congestion as outlined. Increased sinus pressure. Continue inhalers. Using trelegy at night. Has rescue inhlaer prn. Given persistent symptoms and coughing fits, treat with prednisone taper as directed. Omnicef. Saline nasal spray and steroid nasal spray. Follow.  Call with update.

## 2023-09-17 NOTE — Assessment & Plan Note (Signed)
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  Recommended f/u in 06/2020.  overdue.  She request referral to [facility].  Did not keep appt.  Will reschedule. Also with constipation. Miralax/benefiber. Follow.

## 2023-09-17 NOTE — Assessment & Plan Note (Signed)
Continue amlodipine and metoprolol and hydrochlorothiazide 12.5mg  q day.   Follow pressures.  Follow metabolic panel.

## 2023-09-17 NOTE — Assessment & Plan Note (Signed)
On synthroid. Dose adjusted. Follow tsh.

## 2023-09-17 NOTE — Assessment & Plan Note (Signed)
PPI.  Described dysphagia.  Discussed the need for GI evaluation and question of need for EGD.

## 2023-09-17 NOTE — Assessment & Plan Note (Signed)
Previously diagnosed with dermatomyositis.  Evaluated by rheumatology.

## 2023-09-17 NOTE — Assessment & Plan Note (Signed)
Has been followed by psychiatry.  Continues on effexor and wellbutrin. Continue f/u with psychiatry.

## 2023-09-22 NOTE — Telephone Encounter (Signed)
 Thank you for the update.  Pts husband said to call him if unable to get her.

## 2023-09-26 NOTE — Progress Notes (Signed)
BH MD/PA/NP OP Progress Note  10/02/2023 3:59 PM ICESIS RENN  MRN:  [id number]  Chief Complaint:  Chief Complaint  Patient presents with   Follow-up   HPI:  - she is not seen since Oct 2024 This is a follow-up appointment for depression, anxiety.  She states that her anxiety is worse..  She is worried about things which is not happening.  She talks about an example of her hearing ambulance, and being worried about her grandchild in [place].  She is struggling with back pain.  She has no energy.  Although she is able to enjoy when she is out, such as going to [employer], she can sustain the house otherwise. She has persistence cough in the last month.  She has weight loss, which she attributes to appetite loss.  She has dysphagia, and is in the process of seeing GI provider.  She feels down.  She denies SI.  She denies alcohol use or drug.  Although she is unsure if there has been any change since uptitration of bupropion, she agrees to stay on the current medication regimen for now.   Wt Readings from Last 3 Encounters:  10/02/23 176 lb 12.8 oz (80.2 kg)  09/12/23 176 lb (79.8 kg)  06/15/23 186 lb 6.4 oz (84.6 kg)     Substance use   Tobacco Alcohol Other substances/  Current denies denies CBD Gummies  Past denies denies denies  Past Treatment            Employment: unemployed, disability due to back pain s/p six back surgery (used to work for medical transcription) Support: husband Household:  Husband, grandson (age 69. Moved in since age 33 to help patient and her husband for transportation) Marital status: married Number of children: 1 daughter ("really good' relationship) She has "wonderful" parents. Her mother deceased 2020/03/01.   Visit Diagnosis:    ICD-10-CM   1. MDD (major depressive disorder), recurrent episode, mild (HCC)  F33.0     2. Anxiety state  F41.1       Past Psychiatric History: Please see initial evaluation for full details. I have reviewed the  history. No updates at this time.     Past Medical History:  Past Medical History:  Diagnosis Date   Allergic rhinitis    Anxiety    Arthralgia    Asthma    Bronchitis, chronic (HCC)    Chronic back pain    Collagenous colitis    followed by Dr Markham Jordan   COVID-19    09/03/20 with pneumonia   Depression    Diplopia 11/15/2013   Dysphagia    Endometriosis    Fibrocystic breast disease    GERD (gastroesophageal reflux disease)    History of colon polyps    Hyperlipidemia    Hypertension    Hypokalemia    IBS (irritable bowel syndrome)    Memory difficulty 11/15/2013   Migraine headache    Pneumonia 2017   x2   PONV (postoperative nausea and vomiting)    used patch   Sleep apnea    recommended CPAP   Ulcer disease    Urine incontinence     Past Surgical History:  Procedure Laterality Date   ABDOMINAL HYSTERECTOMY     endometriosis   BACK SURGERY     x6   BREAST BIOPSY Right    COLONOSCOPY WITH PROPOFOL N/A 06/25/2015   Procedure: COLONOSCOPY WITH PROPOFOL;  Surgeon: Scot Jun, MD;  Location: [facility] ENDOSCOPY;  Service: Endoscopy;  Laterality: N/A;   DILATION AND CURETTAGE OF UTERUS     and hysteroscopy   ESOPHAGOGASTRODUODENOSCOPY N/A 06/25/2015   Procedure: ESOPHAGOGASTRODUODENOSCOPY (EGD);  Surgeon: Scot Jun, MD;  Location: [facility] ENDOSCOPY;  Service: Endoscopy;  Laterality: N/A;   insertion of permanent spinal cord stimulator     insertion of trial spinal cord stimulator     LUMBAR DISC SURGERY     LUMBAR FUSION  4/05 and 8/07   LUMBAR MICRODISCECTOMY  03/28/03   L4-5 L5   MOUTH SURGERY     NASAL SEPTOPLASTY W/ TURBINOPLASTY     removal of spinal cord stimulator     SEPTOPLASTY     with reduction of turbinates   THYROIDECTOMY N/A 06/06/2016   Procedure: THYROIDECTOMY;  Surgeon: Earline Mayotte, MD;  Location: [facility] ORS;  Service: General;  Laterality: N/A;    Family Psychiatric History: Please see initial evaluation for full details. I have  reviewed the history. No updates at this time.     Family History:  Family History  Problem Relation Age of Onset   Allergies Mother    Hypertension Mother    Arthritis Mother    Hyperlipidemia Mother    Stroke Mother    Hypertension Father    Arthritis Father    Diabetes Father    Hypertension Brother    Diabetes Brother    Arthritis Maternal Grandmother    Hyperlipidemia Maternal Grandmother    Hyperlipidemia Maternal Grandfather    Arthritis Paternal Grandmother    Arthritis Paternal Grandfather    Asthma Other        several family member   Leukemia Other        uncle   Lymphoma Maternal Aunt        non hodgkins    Social History:  Social History   Socioeconomic History   Marital status: Married    Spouse name: douglas   Number of children: 1   Years of education: 12 th   Highest education level: High school graduate  Occupational History   Occupation: Disabled    Employer: DISABLED  Tobacco Use   Smoking status: Never   Smokeless tobacco: Never  Vaping Use   Vaping status: Never Used  Substance and Sexual Activity   Alcohol use: No    Alcohol/week: 0.0 standard drinks of alcohol   Drug use: No   Sexual activity: Not Currently  Other Topics Concern   Not on file  Social History Narrative   ** Merged History Encounter **       Social Drivers of Health   Financial Resource Strain: Low Risk  (05/05/2020)   Overall Financial Resource Strain (CARDIA)    Difficulty of Paying Living Expenses: Not hard at all  Food Insecurity: No Food Insecurity (05/05/2020)   Hunger Vital Sign    Worried About Running Out of Food in the Last Year: Never true    Ran Out of Food in the Last Year: Never true  Transportation Needs: No Transportation Needs (05/05/2020)   PRAPARE - Administrator, Civil Service (Medical): No    Lack of Transportation (Non-Medical): No  Physical Activity: Inactive (07/17/2017)   Exercise Vital Sign    Days of Exercise per Week: 0  days    Minutes of Exercise per Session: 0 min  Stress: No Stress Concern Present (05/05/2020)   [employer] of Occupational Health - Occupational Stress Questionnaire    Feeling of Stress : Not at all  Social Connections: Unknown (05/05/2020)   Social Connection and Isolation Panel [NHANES]    Frequency of Communication with Friends and Family: Not on file    Frequency of Social Gatherings with Friends and Family: Not on file    Attends Religious Services: Not on file    Active Member of Clubs or Organizations: Not on file    Attends Banker Meetings: Not on file    Marital Status: Married    Allergies:  Allergies  Allergen Reactions   Fentanyl Other (See Comments)    Other reaction(s): Headache, Other (See Comments) headache SEVERE HEADACHE headache   Sumatriptan Other (See Comments)    Other reaction(s): Headache Severe headache   Doxycycline Other (See Comments)    Other reaction(s): Headache, Other (See Comments) REACTION: causes severe abd pain REACTION: causes severe abd pain REACTION: causes severe abd pain   Montelukast Other (See Comments)    SEVERE HEADACHE   Oxymorphone Other (See Comments)    REACTION: severe headaches   Benzoin Rash   Buspirone Other (See Comments)    SEVERE HEADACHE   Ciprofloxacin Nausea Only   Conjugated Estrogens Other (See Comments)    headaches   Cymbalta [Duloxetine Hcl] Other (See Comments)    Headache   Duloxetine Other (See Comments)    Headache   Erythromycin Ethylsuccinate Other (See Comments)    Stomach ache   Estradiol Other (See Comments)    headache   Metronidazole Other (See Comments)    abd pain   Mirtazapine Other (See Comments)    headache    Metabolic Disorder Labs: Lab Results  Component Value Date   HGBA1C 5.4 05/28/2020   No results found for: "PROLACTIN" Lab Results  Component Value Date   CHOL 228 (H) 06/15/2023   TRIG 177.0 (H) 06/15/2023   HDL 50.80 06/15/2023   CHOLHDL 4  06/15/2023   VLDL 35.4 06/15/2023   LDLCALC 142 (H) 06/15/2023   LDLCALC 121 (H) 07/12/2016   Lab Results  Component Value Date   TSH 0.03 (L) 08/22/2023   TSH 0.28 (L) 06/15/2023    Therapeutic Level Labs: No results found for: "LITHIUM" No results found for: "VALPROATE" No results found for: "CBMZ"  Current Medications: Current Outpatient Medications  Medication Sig Dispense Refill   albuterol (VENTOLIN HFA) 108 (90 Base) MCG/ACT inhaler TAKE 2 PUFFS BY MOUTH EVERY 6 HOURS AS NEEDED FOR WHEEZE OR SHORTNESS OF BREATH 18 each 1   amLODipine (NORVASC) 5 MG tablet TAKE 1 TABLET BY MOUTH EVERY DAY 90 tablet 0   bisacodyl (DULCOLAX) 5 MG EC tablet Take 5 mg by mouth daily as needed for moderate constipation.     buPROPion (WELLBUTRIN XL) 150 MG 24 hr tablet Take 1 tablet (150 mg total) by mouth daily. 90 tablet 0   cefdinir (OMNICEF) 300 MG capsule Take 1 capsule (300 mg total) by mouth 2 (two) times daily. 14 capsule 0   diazepam (VALIUM) 5 MG tablet Take 5 mg by mouth 3 (three) times daily as needed for anxiety.     docusate sodium (COLACE) 100 MG capsule Take 100 mg by mouth 2 (two) times daily.     gabapentin (NEURONTIN) 600 MG tablet Take 600 mg by mouth 3 (three) times daily.     hydrochlorothiazide (HYDRODIURIL) 12.5 MG tablet TAKE 1 TABLET BY MOUTH EVERY DAY 90 tablet 1   levothyroxine (SYNTHROID) 100 MCG tablet Take 1 tablet (100 mcg total) by mouth daily. 90 tablet 3   metoprolol tartrate (  LOPRESSOR) 50 MG tablet TAKE 1/2 TABLET BY MOUTH TWICE A DAY (NEED OFFICE VISIT) 90 tablet 1   Multiple Vitamin (MULTIVITAMIN) capsule Take 1 capsule by mouth daily.     mupirocin ointment (BACTROBAN) 2 % Apply 1 Application topically 2 (two) times daily. 22 g 0   Oxycodone HCl 10 MG TABS Take 10 mg by mouth 3 (three) times daily as needed (breakthrough pain).     pravastatin (PRAVACHOL) 20 MG tablet TAKE 1 TABLET BY MOUTH EVERY DAY 90 tablet 1   predniSONE (DELTASONE) 10 MG tablet Take 6  tablets x 1 day and then decrease by 1/2 tablet per day until down to zero mg. 39 tablet 0   sodium chloride (OCEAN) 0.65 % SOLN nasal spray Place 2 sprays into both nostrils daily as needed for congestion. 30 mL 2   TRELEGY ELLIPTA 100-62.5-25 MCG/ACT AEPB INHALE 1 PUFF BY MOUTH EVERY DAY 60 each 1   XTAMPZA ER 18 MG C12A Take 1 capsule by mouth 2 (two) times daily.     zolpidem (AMBIEN) 10 MG tablet Take 10 mg by mouth at bedtime as needed for sleep.     [START ON 10/17/2023] venlafaxine XR (EFFEXOR-XR) 150 MG 24 hr capsule Take 1 capsule (150 mg total) by mouth daily. Take total of 225 mg daily. Take along with 75 mg cap 90 capsule 1   venlafaxine XR (EFFEXOR-XR) 75 MG 24 hr capsule Take 1 capsule (75 mg total) by mouth daily. Take total of 225 mg daily. Take along with 150 mg cap 90 capsule 1   No current facility-administered medications for this visit.     Musculoskeletal: Strength & Muscle Tone: within normal limits Gait & Station: normal Patient leans: N/A  Psychiatric Specialty Exam: Review of Systems  Psychiatric/Behavioral:  Positive for dysphoric mood and sleep disturbance. Negative for agitation, behavioral problems, confusion, decreased concentration, hallucinations, self-injury and suicidal ideas. The patient is nervous/anxious. The patient is not hyperactive.   All other systems reviewed and are negative.   Blood pressure 126/82, pulse 74, temperature 97.9 F (36.6 C), temperature source Temporal, height 5\' 7"  (1.702 m), weight 176 lb 12.8 oz (80.2 kg), SpO2 100%.Body mass index is 27.69 kg/m.  General Appearance: Well Groomed  Eye Contact:  Good  Speech:  Clear and Coherent  Volume:  Normal  Mood:   not good  Affect:  Appropriate, Congruent, and Full Range  Thought Process:  Coherent  Orientation:  Full (Time, Place, and Person)  Thought Content: Logical   Suicidal Thoughts:  No  Homicidal Thoughts:  No  Memory:  Immediate;   Good  Judgement:  Good  Insight:  Good   Psychomotor Activity:  Normal  Concentration:  Concentration: Good and Attention Span: Good  Recall:  Good  Fund of Knowledge: Good  Language: Good  Akathisia:  No  Handed:  Right  AIMS (if indicated): not done  Assets:  Communication Skills Desire for Improvement  ADL's:  Intact  Cognition: WNL  Sleep:  Poor   Screenings: GAD-7    Flowsheet Row Office Visit from 05/16/2023 in [facility]  Total GAD-7 Score 13      PHQ2-9    Flowsheet Row Office Visit from 06/15/2023 in [facility] [employer] Visit from 05/16/2023 in [facility] Office Visit from 11/28/2022 in [facility] Office Visit from 05/30/2022 in [facility] [employer] [facility] Video Visit  from 08/26/2021 in [facility]  PHQ-2 Total Score 2 4 6 1 2   PHQ-9 Total Score 8 14 19  -- 14      Flowsheet Row Office Visit from 11/28/2022 in [facility] Video Visit from 08/26/2021 in [facility] Video Visit from 01/14/2021 in [facility]  C-SSRS RISK CATEGORY Error: Q3, 4, or 5 should not be populated when Q2 is No Error: Q3, 4, or 5 should not be populated when Q2 is No Low Risk        Assessment and Plan:  [name] is a 63 y.o. year old female with a history of depression, anxiety,  HTN, HL, dysphasia, asthma. migraine, sleep apnea (not able to use cpap machine), s/p Covid pneumonia in 08/2020, who presents for follow up appointment for below.   1. MDD (major depressive disorder), recurrent episode, mild (HCC) 2. Anxiety state Acute stressors include: loss of her father with dementia, CHF in April 2024 Other stressors include: loss of her mother in June 2021, neck through back pain     History:    She reports slight worsening in anxiety, and depressive symptoms including fatigue since her last visit.  It is likely multifactorial given medication induced hyperthyroidism, persistent cough, on steroid.  She agrees to stay on the current medication regimen for now, and reassess once her physical symptoms are stabilized.  Will continue venlafaxine to target depression and anxiety, along with bupropion as adjunctive treatment for depression.  She has no known history of seizure..   2. Restless leg syndrome 3. 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 Insomnia, unspecified type -She has a history of sleep apnea, although she has not used CPAP machine for many years.   She will see her pulmonologist for asthma.  She is advised to discuss regarding the possible reevaluation of sleep apnea.   Plan Continue venlafaxine 225 mg daily Continue bupropion 150 mg daily  Next appointment:  4/15 at 3:30, IP - on Ambien 10 mg at night, valium 5 mg TID for muscle spasm - gabapentin 600 mg TID for nerve pain - iron pill   Past trials- duloxetine (did not like), nortriptyline (VH), Abilify, quetiapine, (she cannot recall other medication trials)    The patient demonstrates the following risk factors for suicide: Chronic risk factors for suicide include: psychiatric disorder of depression. Acute risk factors for suicide include: unemployment. Protective factors for this patient include: positive social support, coping skills and hope for the future. Considering these factors, the overall suicide risk at this point appears to be low. Patient is appropriate for outpatient follow up.  A total of 30 minutes was spent on the following activities during the encounter date, which includes but is not limited to: preparing to see the patient (e.g., reviewing tests and records), obtaining and/or reviewing  separately obtained history, performing a medically necessary examination or evaluation, counseling and educating the patient, family, or caregiver, ordering medications, tests, or procedures, referring and communicating with other healthcare professionals (when not reported separately), documenting clinical information in the electronic or paper health record, independently interpreting test or lab results and communicating these results to the family or caregiver, and coordinating care (when not reported separately).       Collaboration of Care: Collaboration of Care: Other reviewed notes in Epic  Patient/Guardian was advised  Release of Information must be obtained prior to any record release in order to collaborate their care with an outside provider. Patient/Guardian was advised if they have not already done so to contact the registration department to sign all necessary forms in order for [place] to release information regarding their care.   Consent: Patient/Guardian gives verbal consent for treatment and assignment of benefits for services provided during this visit. Patient/Guardian expressed understanding and agreed to proceed.    Neysa Hotter, MD 10/02/2023, 3:59 PM

## 2023-09-30 ENCOUNTER — Other Ambulatory Visit: Payer: Self-pay | Admitting: Internal Medicine

## 2023-10-02 ENCOUNTER — Encounter: Payer: Self-pay | Admitting: Psychiatry

## 2023-10-02 ENCOUNTER — Ambulatory Visit: Payer: BC Managed Care – PPO | Admitting: Psychiatry

## 2023-10-02 VITALS — BP 126/82 | HR 74 | Temp 97.9°F | Ht 67.0 in | Wt 176.8 lb

## 2023-10-02 DIAGNOSIS — F411 Generalized anxiety disorder: Secondary | ICD-10-CM

## 2023-10-02 DIAGNOSIS — F330 Major depressive disorder, recurrent, mild: Secondary | ICD-10-CM

## 2023-10-02 MED ORDER — VENLAFAXINE HCL ER 150 MG PO CP24: 150.0000 mg | ORAL_CAPSULE | Freq: Every day | ORAL | 1 refills | Status: DC

## 2023-10-02 MED ORDER — VENLAFAXINE HCL ER 75 MG PO CP24
75.0000 mg | ORAL_CAPSULE | Freq: Every day | ORAL | 1 refills | Status: DC
Start: 2023-10-02 — End: 2024-03-20

## 2023-10-02 NOTE — Patient Instructions (Signed)
Continue venlafaxine 225 mg daily Continue bupropion 150 mg daily  Next appointment:  4/15 at 3:30

## 2023-10-06 ENCOUNTER — Other Ambulatory Visit: Payer: Self-pay | Admitting: Internal Medicine

## 2023-10-06 DIAGNOSIS — J45901 Unspecified asthma with (acute) exacerbation: Secondary | ICD-10-CM

## 2023-11-02 ENCOUNTER — Other Ambulatory Visit: Payer: Self-pay | Admitting: Internal Medicine

## 2023-11-23 NOTE — Progress Notes (Deleted)
 BH MD/PA/NP OP Progress Note  11/23/2023 1:58 PM [name]  MRN:  [id number]  Chief Complaint: No chief complaint on file.  HPI: ***  Employment: unemployed, disability due to back pain s/p six back surgery (used to work for medical transcription) Support: husband Household:  Husband, grandson (age 65. Moved in since age 25 to help patient and her husband for transportation) Marital status: married Number of children: 1 daughter ("really good' relationship) She has "wonderful" parents. Her mother deceased 02/08/2020.  Visit Diagnosis: No diagnosis found.  Past Psychiatric History: Please see initial evaluation for full details. I have reviewed the history. No updates at this time.     Past Medical History:  Past Medical History:  Diagnosis Date   Allergic rhinitis    Anxiety    Arthralgia    Asthma    Bronchitis, chronic (HCC)    Chronic back pain    Collagenous colitis    followed by Dr Markham Jordan   COVID-19    09/03/20 with pneumonia   Depression    Diplopia 11/15/2013   Dysphagia    Endometriosis    Fibrocystic breast disease    GERD (gastroesophageal reflux disease)    History of colon polyps    Hyperlipidemia    Hypertension    Hypokalemia    IBS (irritable bowel syndrome)    Memory difficulty 11/15/2013   Migraine headache    Pneumonia 2017   x2   PONV (postoperative nausea and vomiting)    used patch   Sleep apnea    recommended CPAP   Ulcer disease    Urine incontinence     Past Surgical History:  Procedure Laterality Date   ABDOMINAL HYSTERECTOMY     endometriosis   BACK SURGERY     x6   BREAST BIOPSY Right    COLONOSCOPY WITH PROPOFOL N/A 06/25/2015   Procedure: COLONOSCOPY WITH PROPOFOL;  Surgeon: Scot Jun, MD;  Location: [facility] ENDOSCOPY;  Service: Endoscopy;  Laterality: N/A;   DILATION AND CURETTAGE OF UTERUS     and hysteroscopy   ESOPHAGOGASTRODUODENOSCOPY N/A 06/25/2015   Procedure: ESOPHAGOGASTRODUODENOSCOPY (EGD);  Surgeon: Scot Jun, MD;  Location: [facility] ENDOSCOPY;  Service: Endoscopy;  Laterality: N/A;   insertion of permanent spinal cord stimulator     insertion of trial spinal cord stimulator     LUMBAR DISC SURGERY     LUMBAR FUSION  4/05 and 8/07   LUMBAR MICRODISCECTOMY  03/28/03   L4-5 L5   MOUTH SURGERY     NASAL SEPTOPLASTY W/ TURBINOPLASTY     removal of spinal cord stimulator     SEPTOPLASTY     with reduction of turbinates   THYROIDECTOMY N/A 06/06/2016   Procedure: THYROIDECTOMY;  Surgeon: Earline Mayotte, MD;  Location: [facility] ORS;  Service: General;  Laterality: N/A;    Family Psychiatric History: Please see initial evaluation for full details. I have reviewed the history. No updates at this time.     Family History:  Family History  Problem Relation Age of Onset   Allergies Mother    Hypertension Mother    Arthritis Mother    Hyperlipidemia Mother    Stroke Mother    Hypertension Father    Arthritis Father    Diabetes Father    Hypertension Brother    Diabetes Brother    Arthritis Maternal Grandmother    Hyperlipidemia Maternal Grandmother    Hyperlipidemia Maternal Grandfather    Arthritis Paternal Grandmother  Arthritis Paternal Grandfather    Asthma Other        several family member   Leukemia Other        uncle   Lymphoma Maternal Aunt        non hodgkins    Social History:  Social History   Socioeconomic History   Marital status: Married    Spouse name: douglas   Number of children: 1   Years of education: 12 th   Highest education level: High school graduate  Occupational History   Occupation: Disabled    Employer: DISABLED  Tobacco Use   Smoking status: Never   Smokeless tobacco: Never  Vaping Use   Vaping status: Never Used  Substance and Sexual Activity   Alcohol use: No    Alcohol/week: 0.0 standard drinks of alcohol   Drug use: No   Sexual activity: Not Currently  Other Topics Concern   Not on file  Social History Narrative   ** Merged  History Encounter **       Social Drivers of Health   Financial Resource Strain: Low Risk  (05/05/2020)   Overall Financial Resource Strain (CARDIA)    Difficulty of Paying Living Expenses: Not hard at all  Food Insecurity: No Food Insecurity (05/05/2020)   Hunger Vital Sign    Worried About Running Out of Food in the Last Year: Never true    Ran Out of Food in the Last Year: Never true  Transportation Needs: No Transportation Needs (05/05/2020)   PRAPARE - Administrator, Civil Service (Medical): No    Lack of Transportation (Non-Medical): No  Physical Activity: Inactive (07/17/2017)   Exercise Vital Sign    Days of Exercise per Week: 0 days    Minutes of Exercise per Session: 0 min  Stress: No Stress Concern Present (05/05/2020)   [employer] of Occupational Health - Occupational Stress Questionnaire    Feeling of Stress : Not at all  Social Connections: Unknown (05/05/2020)   Social Connection and Isolation Panel [NHANES]    Frequency of Communication with Friends and Family: Not on file    Frequency of Social Gatherings with Friends and Family: Not on file    Attends Religious Services: Not on file    Active Member of Clubs or Organizations: Not on file    Attends Banker Meetings: Not on file    Marital Status: Married    Allergies:  Allergies  Allergen Reactions   Fentanyl Other (See Comments)    Other reaction(s): Headache, Other (See Comments) headache SEVERE HEADACHE headache   Sumatriptan Other (See Comments)    Other reaction(s): Headache Severe headache   Doxycycline Other (See Comments)    Other reaction(s): Headache, Other (See Comments) REACTION: causes severe abd pain REACTION: causes severe abd pain REACTION: causes severe abd pain   Montelukast Other (See Comments)    SEVERE HEADACHE   Oxymorphone Other (See Comments)    REACTION: severe headaches   Benzoin Rash   Buspirone Other (See Comments)    SEVERE HEADACHE    Ciprofloxacin Nausea Only   Conjugated Estrogens Other (See Comments)    headaches   Cymbalta [Duloxetine Hcl] Other (See Comments)    Headache   Duloxetine Other (See Comments)    Headache   Erythromycin Ethylsuccinate Other (See Comments)    Stomach ache   Estradiol Other (See Comments)    headache   Metronidazole Other (See Comments)    abd pain   Mirtazapine  Other (See Comments)    headache    Metabolic Disorder Labs: Lab Results  Component Value Date   HGBA1C 5.4 05/28/2020   No results found for: "PROLACTIN" Lab Results  Component Value Date   CHOL 228 (H) 06/15/2023   TRIG 177.0 (H) 06/15/2023   HDL 50.80 06/15/2023   CHOLHDL 4 06/15/2023   VLDL 35.4 06/15/2023   LDLCALC 142 (H) 06/15/2023   LDLCALC 121 (H) 07/12/2016   Lab Results  Component Value Date   TSH 0.03 (L) 08/22/2023   TSH 0.28 (L) 06/15/2023    Therapeutic Level Labs: No results found for: "LITHIUM" No results found for: "VALPROATE" No results found for: "CBMZ"  Current Medications: Current Outpatient Medications  Medication Sig Dispense Refill   albuterol (VENTOLIN HFA) 108 (90 Base) MCG/ACT inhaler TAKE 2 PUFFS BY MOUTH EVERY 6 HOURS AS NEEDED FOR WHEEZE OR SHORTNESS OF BREATH 18 each 1   amLODipine (NORVASC) 5 MG tablet TAKE 1 TABLET BY MOUTH EVERY DAY 90 tablet 0   bisacodyl (DULCOLAX) 5 MG EC tablet Take 5 mg by mouth daily as needed for moderate constipation.     buPROPion (WELLBUTRIN XL) 150 MG 24 hr tablet Take 1 tablet (150 mg total) by mouth daily. 90 tablet 0   cefdinir (OMNICEF) 300 MG capsule Take 1 capsule (300 mg total) by mouth 2 (two) times daily. 14 capsule 0   diazepam (VALIUM) 5 MG tablet Take 5 mg by mouth 3 (three) times daily as needed for anxiety.     docusate sodium (COLACE) 100 MG capsule Take 100 mg by mouth 2 (two) times daily.     gabapentin (NEURONTIN) 600 MG tablet Take 600 mg by mouth 3 (three) times daily.     hydrochlorothiazide (HYDRODIURIL) 12.5 MG tablet  TAKE 1 TABLET BY MOUTH EVERY DAY 90 tablet 1   levothyroxine (SYNTHROID) 100 MCG tablet Take 1 tablet (100 mcg total) by mouth daily. 90 tablet 3   metoprolol tartrate (LOPRESSOR) 50 MG tablet TAKE 1/2 TABLET BY MOUTH TWICE A DAY (NEED OFFICE VISIT) 90 tablet 1   Multiple Vitamin (MULTIVITAMIN) capsule Take 1 capsule by mouth daily.     mupirocin ointment (BACTROBAN) 2 % Apply 1 Application topically 2 (two) times daily. 22 g 0   Oxycodone HCl 10 MG TABS Take 10 mg by mouth 3 (three) times daily as needed (breakthrough pain).     pravastatin (PRAVACHOL) 20 MG tablet TAKE 1 TABLET BY MOUTH EVERY DAY 90 tablet 1   predniSONE (DELTASONE) 10 MG tablet Take 6 tablets x 1 day and then decrease by 1/2 tablet per day until down to zero mg. 39 tablet 0   sodium chloride (OCEAN) 0.65 % SOLN nasal spray Place 2 sprays into both nostrils daily as needed for congestion. 30 mL 2   TRELEGY ELLIPTA 100-62.5-25 MCG/ACT AEPB INHALE 1 PUFF BY MOUTH EVERY DAY 60 each 1   venlafaxine XR (EFFEXOR-XR) 150 MG 24 hr capsule Take 1 capsule (150 mg total) by mouth daily. Take total of 225 mg daily. Take along with 75 mg cap 90 capsule 1   venlafaxine XR (EFFEXOR-XR) 75 MG 24 hr capsule Take 1 capsule (75 mg total) by mouth daily. Take total of 225 mg daily. Take along with 150 mg cap 90 capsule 1   XTAMPZA ER 18 MG C12A Take 1 capsule by mouth 2 (two) times daily.     zolpidem (AMBIEN) 10 MG tablet Take 10 mg by mouth at bedtime as needed  for sleep.     No current facility-administered medications for this visit.     Musculoskeletal: Strength & Muscle Tone: within normal limits Gait & Station: normal Patient leans: N/A  Psychiatric Specialty Exam: Review of Systems  There were no vitals taken for this visit.There is no height or weight on file to calculate BMI.  General Appearance: {Appearance:22683}  Eye Contact:  {B[facility] EYE CONTACT:22684}  Speech:  Clear and Coherent  Volume:  Normal  Mood:  {BHH MOOD:22306}   Affect:  {Affect (PAA):22687}  Thought Process:  Coherent  Orientation:  Full (Time, Place, and Person)  Thought Content: Logical   Suicidal Thoughts:  {ST/HT (PAA):22692}  Homicidal Thoughts:  {ST/HT (PAA):22692}  Memory:  Immediate;   Good  Judgement:  {Judgement (PAA):22694}  Insight:  {Insight (PAA):22695}  Psychomotor Activity:  Normal  Concentration:  Concentration: Good and Attention Span: Good  Recall:  Good  Fund of Knowledge: Good  Language: Good  Akathisia:  No  Handed:  Right  AIMS (if indicated): not done  Assets:  Communication Skills Desire for Improvement  ADL's:  Intact  Cognition: WNL  Sleep:  {B[facility] GOOD/FAIR/POOR:22877}   Screenings: GAD-7    Flowsheet Row Office Visit from 05/16/2023 in [facility]  Total GAD-7 Score 13      PHQ2-9    Flowsheet Row Office Visit from 06/15/2023 in [facility] Visit from 05/16/2023 in [facility] Office Visit from 11/28/2022 in [facility] Office Visit from 05/30/2022 in [facility] Video Visit from 08[facility]26/2021 in [facility]  PHQ-2 Total Score 2 4 6 1 2   PHQ-9 Total Score 8 14 19  -- 14      Flowsheet Row Office Visit from 11/28/2022 in [facility] Video Visit from 08/26/2021 in [facility] Video Visit from 01/14/2021 in [facility]  C-SSRS RISK CATEGORY Error: Q3, 4, or 5 should not be populated when Q2 is No Error: Q3, 4, or 5 should not be populated when Q2 is No Low Risk        Assessment and Plan:  [name] is a 63 y.o. year old female with a history of depression, anxiety,  HTN, HL, dysphasia, asthma. migraine, sleep apnea (not able to use cpap  machine), s/p Covid pneumonia in 08/2020, who presents for follow up appointment for below.    1. MDD (major depressive disorder), recurrent episode, mild (HCC) 2. Anxiety state Acute stressors include: loss of her father with dementia, CHF in April 2024 Other stressors include: loss of her mother in June 2021, neck through back pain    History:    She reports slight worsening in anxiety, and depressive symptoms including fatigue since her last visit.  It is likely multifactorial given medication induced hyperthyroidism, persistent cough, on steroid.  She agrees to stay on the current medication regimen for now, and reassess once her physical symptoms are stabilized.  Will continue venlafaxine to target depression and anxiety, along with bupropion as adjunctive treatment for depression.  She has no known history of seizure..    2. Restless leg syndrome 3. 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 Insomnia, unspecified type -She has a history of sleep apnea, although she has  not used CPAP machine for many years.   She will see her pulmonologist for asthma.  She is advised to discuss regarding the possible reevaluation of sleep apnea.    Plan Continue venlafaxine 225 mg daily Continue bupropion 150 mg daily  Next appointment:  4/15 at 3:30, IP - on Ambien 10 mg at night, valium 5 mg TID for muscle spasm - gabapentin 600 mg TID for nerve pain - iron pill   Past trials- duloxetine (did not like), nortriptyline ([facility]), Abilify, quetiapine, (she cannot recall other medication trials)    The patient demonstrates the following risk factors for suicide: Chronic risk factors for suicide include: psychiatric disorder of depression. Acute risk factors for suicide include: unemployment. Protective factors for this patient include: positive social support, coping skills and hope for the future. Considering  these factors, the overall suicide risk at this point appears to be low. Patient is appropriate for outpatient follow up.  Collaboration of Care: Collaboration of Care: {BH OP Collaboration of Care:21014065}  Patient/Guardian was advised Release of Information must be obtained prior to any record release in order to collaborate their care with an outside provider. Patient/Guardian was advised if they have not already done so to contact the registration department to sign all necessary forms in order for [place] to release information regarding their care.   Consent: Patient/Guardian gives verbal consent for treatment and assignment of benefits for services provided during this visit. Patient/Guardian expressed understanding and agreed to proceed.    Neysa Hotter, MD 11/23/2023, 1:58 PM

## 2023-11-28 ENCOUNTER — Ambulatory Visit: Payer: BC Managed Care – PPO | Admitting: Psychiatry

## 2023-12-04 DIAGNOSIS — M62830 Muscle spasm of back: Secondary | ICD-10-CM | POA: Diagnosis not present

## 2023-12-04 DIAGNOSIS — G4700 Insomnia, unspecified: Secondary | ICD-10-CM | POA: Diagnosis not present

## 2023-12-04 DIAGNOSIS — M47817 Spondylosis without myelopathy or radiculopathy, lumbosacral region: Secondary | ICD-10-CM | POA: Diagnosis not present

## 2023-12-04 DIAGNOSIS — G894 Chronic pain syndrome: Secondary | ICD-10-CM | POA: Diagnosis not present

## 2023-12-05 ENCOUNTER — Other Ambulatory Visit: Payer: Self-pay | Admitting: Psychiatry

## 2023-12-05 NOTE — Telephone Encounter (Signed)
Scheduled for 12/18/23.

## 2023-12-05 NOTE — Telephone Encounter (Signed)
 Could you contact her to schedule follow up? thanks

## 2023-12-09 ENCOUNTER — Other Ambulatory Visit: Payer: Self-pay | Admitting: Internal Medicine

## 2023-12-09 DIAGNOSIS — J45901 Unspecified asthma with (acute) exacerbation: Secondary | ICD-10-CM

## 2023-12-12 NOTE — Progress Notes (Deleted)
 BH MD/PA/NP OP Progress Note  12/12/2023 12:40 PM MARALEE HIGUCHI  MRN:  [id number]  Chief Complaint: No chief complaint on file.  HPI: *** Visit Diagnosis: No diagnosis found.  Past Psychiatric History: Please see initial evaluation for full details. I have reviewed the history. No updates at this time.     Past Medical History:  Past Medical History:  Diagnosis Date   Allergic rhinitis    Anxiety    Arthralgia    Asthma    Bronchitis, chronic (HCC)    Chronic back pain    Collagenous colitis    followed by Dr Dawne Euler   COVID-19    09/03/20 with pneumonia   Depression    Diplopia 11/15/2013   Dysphagia    Endometriosis    Fibrocystic breast disease    GERD (gastroesophageal reflux disease)    History of colon polyps    Hyperlipidemia    Hypertension    Hypokalemia    IBS (irritable bowel syndrome)    Memory difficulty 11/15/2013   Migraine headache    Pneumonia 2017   x2   PONV (postoperative nausea and vomiting)    used patch   Sleep apnea    recommended CPAP   Ulcer disease    Urine incontinence     Past Surgical History:  Procedure Laterality Date   ABDOMINAL HYSTERECTOMY     endometriosis   BACK SURGERY     x6   BREAST BIOPSY Right    COLONOSCOPY WITH PROPOFOL  N/A 06/25/2015   Procedure: COLONOSCOPY WITH PROPOFOL ;  Surgeon: Cassie Click, MD;  Location: [facility] ENDOSCOPY;  Service: Endoscopy;  Laterality: N/A;   DILATION AND CURETTAGE OF UTERUS     and hysteroscopy   ESOPHAGOGASTRODUODENOSCOPY N/A 06/25/2015   Procedure: ESOPHAGOGASTRODUODENOSCOPY (EGD);  Surgeon: Cassie Click, MD;  Location: [facility] ENDOSCOPY;  Service: Endoscopy;  Laterality: N/A;   insertion of permanent spinal cord stimulator     insertion of trial spinal cord stimulator     LUMBAR DISC SURGERY     LUMBAR FUSION  4/05 and 8/07   LUMBAR MICRODISCECTOMY  03/28/03   L4-5 L5   MOUTH SURGERY     NASAL SEPTOPLASTY W/ TURBINOPLASTY     removal of spinal cord stimulator     SEPTOPLASTY      with reduction of turbinates   THYROIDECTOMY N/A 06/06/2016   Procedure: THYROIDECTOMY;  Surgeon: Marshall Skeeter, MD;  Location: [facility] ORS;  Service: General;  Laterality: N/A;    Family Psychiatric History: Please see initial evaluation for full details. I have reviewed the history. No updates at this time.     Family History:  Family History  Problem Relation Age of Onset   Allergies Mother    Hypertension Mother    Arthritis Mother    Hyperlipidemia Mother    Stroke Mother    Hypertension Father    Arthritis Father    Diabetes Father    Hypertension Brother    Diabetes Brother    Arthritis Maternal Grandmother    Hyperlipidemia Maternal Grandmother    Hyperlipidemia Maternal Grandfather    Arthritis Paternal Grandmother    Arthritis Paternal Grandfather    Asthma Other        several family member   Leukemia Other        uncle   Lymphoma Maternal Aunt        non hodgkins    Social History:  Social History   Socioeconomic History   Marital status:  Married    Spouse name: douglas   Number of children: 1   Years of education: 12 th   Highest education level: High school graduate  Occupational History   Occupation: Disabled    Employer: DISABLED  Tobacco Use   Smoking status: Never   Smokeless tobacco: Never  Vaping Use   Vaping status: Never Used  Substance and Sexual Activity   Alcohol use: No    Alcohol/week: 0.0 standard drinks of alcohol   Drug use: No   Sexual activity: Not Currently  Other Topics Concern   Not on file  Social History Narrative   ** Merged History Encounter **       Social Drivers of Health   Financial Resource Strain: Low Risk  (05/05/2020)   Overall Financial Resource Strain (CARDIA)    Difficulty of Paying Living Expenses: Not hard at all  Food Insecurity: No Food Insecurity (05/05/2020)   Hunger Vital Sign    Worried About Running Out of Food in the Last Year: Never true    Ran Out of Food in the Last Year: Never true   Transportation Needs: No Transportation Needs (05/05/2020)   PRAPARE - Administrator, Civil Service (Medical): No    Lack of Transportation (Non-Medical): No  Physical Activity: Inactive (07/17/2017)   Exercise Vital Sign    Days of Exercise per Week: 0 days    Minutes of Exercise per Session: 0 min  Stress: No Stress Concern Present (05/05/2020)   [employer] of Occupational Health - Occupational Stress Questionnaire    Feeling of Stress : Not at all  Social Connections: Unknown (05/05/2020)   Social Connection and Isolation Panel [NHANES]    Frequency of Communication with Friends and Family: Not on file    Frequency of Social Gatherings with Friends and Family: Not on file    Attends Religious Services: Not on file    Active Member of Clubs or Organizations: Not on file    Attends Banker Meetings: Not on file    Marital Status: Married    Allergies:  Allergies  Allergen Reactions   Fentanyl Other (See Comments)    Other reaction(s): Headache, Other (See Comments) headache SEVERE HEADACHE headache   Sumatriptan Other (See Comments)    Other reaction(s): Headache Severe headache   Doxycycline Other (See Comments)    Other reaction(s): Headache, Other (See Comments) REACTION: causes severe abd pain REACTION: causes severe abd pain REACTION: causes severe abd pain   Montelukast  Other (See Comments)    SEVERE HEADACHE   Oxymorphone Other (See Comments)    REACTION: severe headaches   Benzoin Rash   Buspirone Other (See Comments)    SEVERE HEADACHE   Ciprofloxacin Nausea Only   Conjugated Estrogens  Other (See Comments)    headaches   Cymbalta [Duloxetine Hcl] Other (See Comments)    Headache   Duloxetine Other (See Comments)    Headache   Erythromycin Ethylsuccinate Other (See Comments)    Stomach ache   Estradiol Other (See Comments)    headache   Metronidazole Other (See Comments)    abd pain   Mirtazapine Other (See Comments)     headache    Metabolic Disorder Labs: Lab Results  Component Value Date   HGBA1C 5.4 05/28/2020   No results found for: "PROLACTIN" Lab Results  Component Value Date   CHOL 228 (H) 06/15/2023   TRIG 177.0 (H) 06/15/2023   HDL 50.80 06/15/2023   CHOLHDL 4 06/15/2023  VLDL 35.4 06/15/2023   LDLCALC 142 (H) 06/15/2023   LDLCALC 121 (H) 07/12/2016   Lab Results  Component Value Date   TSH 0.03 (L) 08/22/2023   TSH 0.28 (L) 06/15/2023    Therapeutic Level Labs: No results found for: "LITHIUM" No results found for: "VALPROATE" No results found for: "CBMZ"  Current Medications: Current Outpatient Medications  Medication Sig Dispense Refill   albuterol  (VENTOLIN  HFA) 108 (90 Base) MCG/ACT inhaler TAKE 2 PUFFS BY MOUTH EVERY 6 HOURS AS NEEDED FOR WHEEZE OR SHORTNESS OF BREATH 18 each 1   amLODipine  (NORVASC ) 5 MG tablet TAKE 1 TABLET BY MOUTH EVERY DAY 90 tablet 0   bisacodyl (DULCOLAX) 5 MG EC tablet Take 5 mg by mouth daily as needed for moderate constipation.     buPROPion  (WELLBUTRIN  XL) 150 MG 24 hr tablet Take 1 tablet (150 mg total) by mouth daily. 90 tablet 0   cefdinir  (OMNICEF ) 300 MG capsule Take 1 capsule (300 mg total) by mouth 2 (two) times daily. 14 capsule 0   diazepam  (VALIUM ) 5 MG tablet Take 5 mg by mouth 3 (three) times daily as needed for anxiety.     docusate sodium  (COLACE) 100 MG capsule Take 100 mg by mouth 2 (two) times daily.     gabapentin  (NEURONTIN ) 600 MG tablet Take 600 mg by mouth 3 (three) times daily.     hydrochlorothiazide  (HYDRODIURIL ) 12.5 MG tablet TAKE 1 TABLET BY MOUTH EVERY DAY 90 tablet 1   levothyroxine  (SYNTHROID ) 100 MCG tablet Take 1 tablet (100 mcg total) by mouth daily. 90 tablet 3   metoprolol  tartrate (LOPRESSOR ) 50 MG tablet TAKE 1/2 TABLET BY MOUTH TWICE A DAY (NEED OFFICE VISIT) 90 tablet 1   Multiple Vitamin (MULTIVITAMIN) capsule Take 1 capsule by mouth daily.     mupirocin  ointment (BACTROBAN ) 2 % Apply 1 Application  topically 2 (two) times daily. 22 g 0   Oxycodone  HCl 10 MG TABS Take 10 mg by mouth 3 (three) times daily as needed (breakthrough pain).     pravastatin  (PRAVACHOL ) 20 MG tablet TAKE 1 TABLET BY MOUTH EVERY DAY 90 tablet 1   predniSONE  (DELTASONE ) 10 MG tablet Take 6 tablets x 1 day and then decrease by 1/2 tablet per day until down to zero mg. 39 tablet 0   sodium chloride  (OCEAN) 0.65 % SOLN nasal spray Place 2 sprays into both nostrils daily as needed for congestion. 30 mL 2   TRELEGY ELLIPTA  100-62.5-25 MCG/ACT AEPB INHALE 1 PUFF BY MOUTH EVERY DAY 60 each 1   venlafaxine  XR (EFFEXOR -XR) 150 MG 24 hr capsule Take 1 capsule (150 mg total) by mouth daily. Take total of 225 mg daily. Take along with 75 mg cap 90 capsule 1   venlafaxine  XR (EFFEXOR -XR) 75 MG 24 hr capsule Take 1 capsule (75 mg total) by mouth daily. Take total of 225 mg daily. Take along with 150 mg cap 90 capsule 1   XTAMPZA  ER 18 MG C12A Take 1 capsule by mouth 2 (two) times daily.     zolpidem  (AMBIEN ) 10 MG tablet Take 10 mg by mouth at bedtime as needed for sleep.     No current facility-administered medications for this visit.     Musculoskeletal: Strength & Muscle Tone: within normal limits Gait & Station: normal Patient leans: N/A  Psychiatric Specialty Exam: Review of Systems  There were no vitals taken for this visit.There is no height or weight on file to calculate BMI.  General Appearance: {  Appearance:22683}  Eye Contact:  {B[facility] EYE CONTACT:22684}  Speech:  Clear and Coherent  Volume:  Normal  Mood:  {B[facility] MOOD:22306}  Affect:  {Affect (PAA):22687}  Thought Process:  Coherent  Orientation:  Full (Time, Place, and Person)  Thought Content: Logical   Suicidal Thoughts:  {ST/HT (PAA):22692}  Homicidal Thoughts:  {ST/HT (PAA):22692}  Memory:  Immediate;   Good  Judgement:  {Judgement (PAA):22694}  Insight:  {Insight (PAA):22695}  Psychomotor Activity:  Normal  Concentration:  Concentration: Good and  Attention Span: Good  Recall:  Good  Fund of Knowledge: Good  Language: Good  Akathisia:  No  Handed:  Right  AIMS (if indicated): not done  Assets:  Communication Skills Desire for Improvement  ADL's:  Intact  Cognition: WNL  Sleep:  {B[facility] GOOD/FAIR/POOR:22877}   Screenings: GAD-7    Flowsheet Row Office Visit from [facility]/16/2023 in [facility]  Total GAD-7 Score 13      PHQ2-9    Flowsheet Row Office Visit from 06/15/2023 in [facility] Visit from 05/16/2023 in [facility] Office Visit from 11/28/2022 in [facility] Office Visit from 05/30/2022 in [facility] Video Visit from 08/26/2021 in [facility]  PHQ-2 Total Score 2 4 6 1 2   PHQ-9 Total Score 8 14 19  -- 14      Flowsheet Row Office Visit from 11/28/2022 in [facility] Video Visit from 08[facility]26/2021 in [facility] Video Visit from 01/14/2021 in [facility]  C-SSRS RISK CATEGORY Error: Q3, 4, or 5 should not be populated when Q2 is No Error: Q3, 4, or 5 should not be populated when Q2 is No Low Risk        Assessment and Plan:  JUBILEE VIVERO is a 63 y.o. year old female with a history of depression, anxiety,  HTN, HL, dysphasia, asthma. migraine, sleep apnea (not able to use cpap machine), s/p Covid pneumonia in 08/2020, who presents for follow up appointment for below.    1. MDD (major depressive disorder), recurrent episode, mild (HCC) 2. Anxiety state Acute stressors include: loss of her father with dementia, CHF in April 2024 Other stressors include: loss of her mother in June 2021, neck through back pain    History:    She reports slight worsening in anxiety,  and depressive symptoms including fatigue since her last visit.  It is likely multifactorial given medication induced hyperthyroidism, persistent cough, on steroid.  She agrees to stay on the current medication regimen for now, and reassess once her physical symptoms are stabilized.  Will continue venlafaxine  to target depression and anxiety, along with bupropion  as adjunctive treatment for depression.  She has no known history of seizure..    2. Restless leg syndrome 3. 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 Insomnia, unspecified type -She has a history of sleep apnea, although she has not used CPAP machine for many years.   She will see her pulmonologist for asthma.  She is advised to discuss regarding the possible reevaluation of sleep apnea.    Plan Continue venlafaxine  225 mg daily Continue bupropion  150 mg daily  Next appointment:  4/15 at 3:30, IP - on Ambien  10 mg at night, valium  5 mg  TID for muscle spasm - gabapentin  600 mg TID for nerve pain - iron pill   Past trials- duloxetine (did not like), nortriptyline  ([facility]), Abilify , quetiapine , (she cannot recall other medication trials)    The patient demonstrates the following risk factors for suicide: Chronic risk factors for suicide include: psychiatric disorder of depression. Acute risk factors for suicide include: unemployment. Protective factors for this patient include: positive social support, coping skills and hope for the future. Considering these factors, the overall suicide risk at this point appears to be low. Patient is appropriate for outpatient follow up.  Collaboration of Care: Collaboration of Care: {BH OP Collaboration of Care:21014065}  Patient/Guardian was advised Release of Information must be obtained prior to any record release in order to collaborate their care with an outside provider. Patient/Guardian was  advised if they have not already done so to contact the registration department to sign all necessary forms in order for us  to release information regarding their care.   Consent: Patient/Guardian gives verbal consent for treatment and assignment of benefits for services provided during this visit. Patient/Guardian expressed understanding and agreed to proceed.    Todd Fossa, MD 12/12/2023, 12:40 PM

## 2023-12-18 ENCOUNTER — Ambulatory Visit: Admitting: Psychiatry

## 2023-12-18 ENCOUNTER — Telehealth: Payer: Self-pay

## 2023-12-18 NOTE — Telephone Encounter (Signed)
 Patient is scheduled for screening mammo 5/22. Tried to call for more info about why she needs diagnostic mammo

## 2023-12-18 NOTE — Telephone Encounter (Signed)
 Copied from CRM [id number]. Topic: Clinical - Request for Lab/Test Order >> Dec 18, 2023 10:13 AM Adonis Hoot wrote: Reason for CRM: Patient would like an updated order sent to [facility] for her to have her diagnostic mammogram with ultrasound. She stated that the previous order expired.

## 2023-12-19 ENCOUNTER — Ambulatory Visit: Admitting: Psychiatry

## 2023-12-28 ENCOUNTER — Other Ambulatory Visit: Payer: Self-pay | Admitting: Internal Medicine

## 2023-12-29 NOTE — Telephone Encounter (Signed)
 LM for patient

## 2024-01-02 DIAGNOSIS — M62830 Muscle spasm of back: Secondary | ICD-10-CM | POA: Diagnosis not present

## 2024-01-02 DIAGNOSIS — M47817 Spondylosis without myelopathy or radiculopathy, lumbosacral region: Secondary | ICD-10-CM | POA: Diagnosis not present

## 2024-01-02 DIAGNOSIS — G4700 Insomnia, unspecified: Secondary | ICD-10-CM | POA: Diagnosis not present

## 2024-01-02 DIAGNOSIS — G894 Chronic pain syndrome: Secondary | ICD-10-CM | POA: Diagnosis not present

## 2024-01-03 NOTE — Telephone Encounter (Signed)
 Patient's husband states she will not answer the phone and he is the contact for her. Patient husband states her breast is sore but he does not know which one or for how long.

## 2024-01-03 NOTE — Telephone Encounter (Signed)
 Left message for the Patient to call us  back at her earliest convenience regarding her mammogram.

## 2024-01-04 ENCOUNTER — Ambulatory Visit
Admission: RE | Admit: 2024-01-04 | Discharge: 2024-01-04 | Disposition: A | Discharge status: Home / Self Care | Source: Ambulatory Visit | Attending: Internal Medicine | Admitting: Internal Medicine

## 2024-01-04 ENCOUNTER — Other Ambulatory Visit: Payer: Self-pay | Admitting: Internal Medicine

## 2024-01-04 DIAGNOSIS — N6322 Unspecified lump in the left breast, upper inner quadrant: Secondary | ICD-10-CM

## 2024-01-04 DIAGNOSIS — Z1231 Encounter for screening mammogram for malignant neoplasm of breast: Secondary | ICD-10-CM | POA: Insufficient documentation

## 2024-01-04 NOTE — Telephone Encounter (Signed)
 Pt has mammogram appt today.

## 2024-01-11 ENCOUNTER — Other Ambulatory Visit

## 2024-01-11 ENCOUNTER — Encounter

## 2024-01-25 ENCOUNTER — Ambulatory Visit
Admission: RE | Admit: 2024-01-25 | Discharge: 2024-01-25 | Disposition: A | Discharge status: Home / Self Care | Source: Ambulatory Visit | Attending: Internal Medicine | Admitting: Internal Medicine

## 2024-01-25 ENCOUNTER — Other Ambulatory Visit: Payer: Self-pay | Admitting: Internal Medicine

## 2024-01-25 DIAGNOSIS — N6322 Unspecified lump in the left breast, upper inner quadrant: Secondary | ICD-10-CM

## 2024-01-25 DIAGNOSIS — N6002 Solitary cyst of left breast: Secondary | ICD-10-CM | POA: Diagnosis not present

## 2024-01-25 DIAGNOSIS — R92323 Mammographic fibroglandular density, bilateral breasts: Secondary | ICD-10-CM | POA: Diagnosis not present

## 2024-01-25 DIAGNOSIS — N6310 Unspecified lump in the right breast, unspecified quadrant: Secondary | ICD-10-CM | POA: Diagnosis not present

## 2024-01-26 ENCOUNTER — Ambulatory Visit: Payer: Self-pay | Admitting: Internal Medicine

## 2024-02-01 ENCOUNTER — Telehealth: Payer: Self-pay

## 2024-02-01 NOTE — Telephone Encounter (Signed)
 Mammogram was already done on 01/25/24.

## 2024-02-06 ENCOUNTER — Other Ambulatory Visit: Payer: Self-pay | Admitting: Internal Medicine

## 2024-02-08 ENCOUNTER — Other Ambulatory Visit: Payer: Self-pay | Admitting: Internal Medicine

## 2024-02-08 DIAGNOSIS — J45901 Unspecified asthma with (acute) exacerbation: Secondary | ICD-10-CM

## 2024-02-13 DIAGNOSIS — G4700 Insomnia, unspecified: Secondary | ICD-10-CM | POA: Diagnosis not present

## 2024-02-13 DIAGNOSIS — G894 Chronic pain syndrome: Secondary | ICD-10-CM | POA: Diagnosis not present

## 2024-02-13 DIAGNOSIS — M47817 Spondylosis without myelopathy or radiculopathy, lumbosacral region: Secondary | ICD-10-CM | POA: Diagnosis not present

## 2024-02-13 DIAGNOSIS — M62830 Muscle spasm of back: Secondary | ICD-10-CM | POA: Diagnosis not present

## 2024-03-07 ENCOUNTER — Other Ambulatory Visit: Payer: Self-pay | Admitting: Psychiatry

## 2024-03-07 NOTE — Telephone Encounter (Signed)
 Could you please contact the patient to schedule a 30-minute follow-up? A virtual visit is fine if that works better. thanks

## 2024-03-13 DIAGNOSIS — M62830 Muscle spasm of back: Secondary | ICD-10-CM | POA: Diagnosis not present

## 2024-03-13 DIAGNOSIS — G894 Chronic pain syndrome: Secondary | ICD-10-CM | POA: Diagnosis not present

## 2024-03-13 DIAGNOSIS — G4700 Insomnia, unspecified: Secondary | ICD-10-CM | POA: Diagnosis not present

## 2024-03-13 DIAGNOSIS — M47817 Spondylosis without myelopathy or radiculopathy, lumbosacral region: Secondary | ICD-10-CM | POA: Diagnosis not present

## 2024-03-20 ENCOUNTER — Other Ambulatory Visit: Payer: Self-pay | Admitting: Psychiatry

## 2024-03-20 ENCOUNTER — Other Ambulatory Visit: Payer: Self-pay | Admitting: Internal Medicine

## 2024-03-20 ENCOUNTER — Telehealth: Payer: Self-pay | Admitting: Psychiatry

## 2024-03-20 DIAGNOSIS — E7800 Pure hypercholesterolemia, unspecified: Secondary | ICD-10-CM

## 2024-03-20 NOTE — Telephone Encounter (Signed)
 Please note that I have not seen her since February, with subsequent appointments being cancelled. If she calls again or cancels another appointment, please advise her that I will not be able to continue providing care. Please recommend that she seek out another practice that can better accommodate her needs.

## 2024-03-27 ENCOUNTER — Other Ambulatory Visit: Payer: Self-pay | Admitting: Psychiatry

## 2024-04-04 ENCOUNTER — Other Ambulatory Visit: Payer: Self-pay | Admitting: Psychiatry

## 2024-04-07 ENCOUNTER — Other Ambulatory Visit: Payer: Self-pay | Admitting: Internal Medicine

## 2024-04-07 DIAGNOSIS — J45901 Unspecified asthma with (acute) exacerbation: Secondary | ICD-10-CM

## 2024-04-10 DIAGNOSIS — M62830 Muscle spasm of back: Secondary | ICD-10-CM | POA: Diagnosis not present

## 2024-04-10 DIAGNOSIS — M47817 Spondylosis without myelopathy or radiculopathy, lumbosacral region: Secondary | ICD-10-CM | POA: Diagnosis not present

## 2024-04-10 DIAGNOSIS — G4700 Insomnia, unspecified: Secondary | ICD-10-CM | POA: Diagnosis not present

## 2024-04-10 DIAGNOSIS — G894 Chronic pain syndrome: Secondary | ICD-10-CM | POA: Diagnosis not present

## 2024-04-13 NOTE — Progress Notes (Deleted)
 [facility] MD/PA/NP OP Progress Note  04/13/2024 9:02 AM CLEA DUBACH  MRN:  [id number]  Chief Complaint: No chief complaint on file.  HPI: *** - She is not seen since Feb 2025   Substance use   Tobacco Alcohol Other substances/  Current denies denies CBD Gummies  Past denies denies denies  Past Treatment            Employment: unemployed, disability due to back pain s/p six back surgery (used to work for medical transcription) Support: husband Household:  Husband, grandson (age 90. Moved in since age 30 to help patient and her husband for transportation) Marital status: married Number of children: 1 daughter (really good' relationship)   Visit Diagnosis: No diagnosis found.  Past Psychiatric History: Please see initial evaluation for full details. I have reviewed the history. No updates at this time.     Past Medical History:  Past Medical History:  Diagnosis Date   Allergic rhinitis    Anxiety    Arthralgia    Asthma    Bronchitis, chronic (HCC)    Chronic back pain    Collagenous colitis    followed by Dr Geryl   COVID-19    09/03/20 with pneumonia   Depression    Diplopia 11/15/2013   Dysphagia    Endometriosis    Fibrocystic breast disease    GERD (gastroesophageal reflux disease)    History of colon polyps    Hyperlipidemia    Hypertension    Hypokalemia    IBS (irritable bowel syndrome)    Memory difficulty 11/15/2013   Migraine headache    Pneumonia 2017   x2   PONV (postoperative nausea and vomiting)    used patch   Sleep apnea    recommended CPAP   Ulcer disease    Urine incontinence     Past Surgical History:  Procedure Laterality Date   ABDOMINAL HYSTERECTOMY     endometriosis   BACK SURGERY     x6   BREAST BIOPSY Right    COLONOSCOPY WITH PROPOFOL  N/A 06/25/2015   Procedure: COLONOSCOPY WITH PROPOFOL ;  Surgeon: Lamar ONEIDA Holmes, MD;  Location: [facility] ENDOSCOPY;  Service: Endoscopy;  Laterality: N/A;   DILATION AND CURETTAGE OF UTERUS     and  hysteroscopy   ESOPHAGOGASTRODUODENOSCOPY N/A 06/25/2015   Procedure: ESOPHAGOGASTRODUODENOSCOPY (EGD);  Surgeon: Lamar ONEIDA Holmes, MD;  Location: [facility] ENDOSCOPY;  Service: Endoscopy;  Laterality: N/A;   insertion of permanent spinal cord stimulator     insertion of trial spinal cord stimulator     LUMBAR DISC SURGERY     LUMBAR FUSION  4/05 and 8/07   LUMBAR MICRODISCECTOMY  03/28/03   L4-5 L5   MOUTH SURGERY     NASAL SEPTOPLASTY W/ TURBINOPLASTY     removal of spinal cord stimulator     SEPTOPLASTY     with reduction of turbinates   THYROIDECTOMY N/A 06/06/2016   Procedure: THYROIDECTOMY;  Surgeon: Reyes LELON Cota, MD;  Location: [facility] ORS;  Service: General;  Laterality: N/A;    Family Psychiatric History: Please see initial evaluation for full details. I have reviewed the history. No updates at this time.     Family History:  Family History  Problem Relation Age of Onset   Allergies Mother    Hypertension Mother    Arthritis Mother    Hyperlipidemia Mother    Stroke Mother    Hypertension Father    Arthritis Father    Diabetes Father  Hypertension Brother    Diabetes Brother    Arthritis Maternal Grandmother    Hyperlipidemia Maternal Grandmother    Hyperlipidemia Maternal Grandfather    Arthritis Paternal Grandmother    Arthritis Paternal Grandfather    Asthma Other        several family member   Leukemia Other        uncle   Lymphoma Maternal Aunt        non hodgkins    Social History:  Social History   Socioeconomic History   Marital status: Married    Spouse name: douglas   Number of children: 1   Years of education: 12 th   Highest education level: High school graduate  Occupational History   Occupation: Disabled    Employer: DISABLED  Tobacco Use   Smoking status: Never   Smokeless tobacco: Never  Vaping Use   Vaping status: Never Used  Substance and Sexual Activity   Alcohol use: No    Alcohol/week: 0.0 standard drinks of alcohol    Drug use: No   Sexual activity: Not Currently  Other Topics Concern   Not on file  Social History Narrative   ** Merged History Encounter **       Social Drivers of Health   Financial Resource Strain: Low Risk  (05/05/2020)   Overall Financial Resource Strain (CARDIA)    Difficulty of Paying Living Expenses: Not hard at all  Food Insecurity: No Food Insecurity (05/05/2020)   Hunger Vital Sign    Worried About Running Out of Food in the Last Year: Never true    Ran Out of Food in the Last Year: Never true  Transportation Needs: No Transportation Needs (05/05/2020)   PRAPARE - Administrator, Civil Service (Medical): No    Lack of Transportation (Non-Medical): No  Physical Activity: Inactive (07/17/2017)   Exercise Vital Sign    Days of Exercise per Week: 0 days    Minutes of Exercise per Session: 0 min  Stress: No Stress Concern Present (05/05/2020)   [employer] of Occupational Health - Occupational Stress Questionnaire    Feeling of Stress : Not at all  Social Connections: Unknown (05/05/2020)   Social Connection and Isolation Panel    Frequency of Communication with Friends and Family: Not on file    Frequency of Social Gatherings with Friends and Family: Not on file    Attends Religious Services: Not on file    Active Member of Clubs or Organizations: Not on file    Attends Banker Meetings: Not on file    Marital Status: Married    Allergies:  Allergies  Allergen Reactions   Fentanyl Other (See Comments)    Other reaction(s): Headache, Other (See Comments) headache SEVERE HEADACHE headache   Sumatriptan Other (See Comments)    Other reaction(s): Headache Severe headache   Doxycycline Other (See Comments)    Other reaction(s): Headache, Other (See Comments) REACTION: causes severe abd pain REACTION: causes severe abd pain REACTION: causes severe abd pain   Montelukast  Other (See Comments)    SEVERE HEADACHE   Oxymorphone Other (See  Comments)    REACTION: severe headaches   Benzoin Rash   Buspirone Other (See Comments)    SEVERE HEADACHE   Ciprofloxacin Nausea Only   Conjugated Estrogens  Other (See Comments)    headaches   Cymbalta [Duloxetine Hcl] Other (See Comments)    Headache   Duloxetine Other (See Comments)    Headache   Erythromycin  Ethylsuccinate Other (See Comments)    Stomach ache   Estradiol Other (See Comments)    headache   Metronidazole Other (See Comments)    abd pain   Mirtazapine Other (See Comments)    headache    Metabolic Disorder Labs: Lab Results  Component Value Date   HGBA1C 5.4 05/28/2020   No results found for: PROLACTIN Lab Results  Component Value Date   CHOL 228 (H) 06/15/2023   TRIG 177.0 (H) 06/15/2023   HDL 50.80 06/15/2023   CHOLHDL 4 06/15/2023   VLDL 35.4 06/15/2023   LDLCALC 142 (H) 06/15/2023   LDLCALC 121 (H) 07/12/2016   Lab Results  Component Value Date   TSH 0.03 (L) 08/22/2023   TSH 0.28 (L) 06/15/2023    Therapeutic Level Labs: No results found for: LITHIUM No results found for: VALPROATE No results found for: CBMZ  Current Medications: Current Outpatient Medications  Medication Sig Dispense Refill   albuterol  (VENTOLIN  HFA) 108 (90 Base) MCG/ACT inhaler TAKE 2 PUFFS BY MOUTH EVERY 6 HOURS AS NEEDED FOR WHEEZE OR SHORTNESS OF BREATH 18 each 1   amLODipine  (NORVASC ) 5 MG tablet TAKE 1 TABLET BY MOUTH EVERY DAY 90 tablet 0   bisacodyl (DULCOLAX) 5 MG EC tablet Take 5 mg by mouth daily as needed for moderate constipation.     buPROPion  (WELLBUTRIN  XL) 150 MG 24 hr tablet Take 1 tablet (150 mg total) by mouth daily. 30 tablet 0   cefdinir  (OMNICEF ) 300 MG capsule Take 1 capsule (300 mg total) by mouth 2 (two) times daily. 14 capsule 0   diazepam  (VALIUM ) 5 MG tablet Take 5 mg by mouth 3 (three) times daily as needed for anxiety.     docusate sodium  (COLACE) 100 MG capsule Take 100 mg by mouth 2 (two) times daily.     gabapentin  (NEURONTIN )  600 MG tablet Take 600 mg by mouth 3 (three) times daily.     hydrochlorothiazide  (HYDRODIURIL ) 12.5 MG tablet TAKE 1 TABLET BY MOUTH EVERY DAY 90 tablet 1   levothyroxine  (SYNTHROID ) 100 MCG tablet Take 1 tablet (100 mcg total) by mouth daily. 90 tablet 3   metoprolol  tartrate (LOPRESSOR ) 50 MG tablet TAKE 1/2 TABLET BY MOUTH TWICE A DAY (NEED OFFICE VISIT) 90 tablet 1   Multiple Vitamin (MULTIVITAMIN) capsule Take 1 capsule by mouth daily.     mupirocin  ointment (BACTROBAN ) 2 % Apply 1 Application topically 2 (two) times daily. 22 g 0   Oxycodone  HCl 10 MG TABS Take 10 mg by mouth 3 (three) times daily as needed (breakthrough pain).     pravastatin  (PRAVACHOL ) 20 MG tablet TAKE 1 TABLET BY MOUTH EVERY DAY 90 tablet 0   predniSONE  (DELTASONE ) 10 MG tablet Take 6 tablets x 1 day and then decrease by 1/2 tablet per day until down to zero mg. 39 tablet 0   sodium chloride  (OCEAN) 0.65 % SOLN nasal spray Place 2 sprays into both nostrils daily as needed for congestion. 30 mL 2   TRELEGY ELLIPTA  100-62.5-25 MCG/ACT AEPB INHALE 1 PUFF BY MOUTH EVERY DAY 60 each 1   [START ON 04/14/2024] venlafaxine  XR (EFFEXOR -XR) 150 MG 24 hr capsule Take 1 capsule (150 mg total) by mouth daily. Take total of 225 mg daily. Take along with 75 mg cap 90 capsule 0   venlafaxine  XR (EFFEXOR -XR) 75 MG 24 hr capsule Take 1 capsule (75 mg total) by mouth daily. Take total of 225 mg daily. Take along with 150 mg cap 90  capsule 0   XTAMPZA  ER 18 MG C12A Take 1 capsule by mouth 2 (two) times daily.     zolpidem  (AMBIEN ) 10 MG tablet Take 10 mg by mouth at bedtime as needed for sleep.     No current facility-administered medications for this visit.     Musculoskeletal: Strength & Muscle Tone: N/A Gait & Station: N/A Patient leans: N/A  Psychiatric Specialty Exam: Review of Systems  There were no vitals taken for this visit.There is no height or weight on file to calculate BMI.  General Appearance: {Appearance:22683}   Eye Contact:  {B[facility] EYE CONTACT:22684}  Speech:  Clear and Coherent  Volume:  Normal  Mood:  {B[facility] MOOD:22306}  Affect:  {Affect (PAA):22687}  Thought Process:  Coherent  Orientation:  Full (Time, Place, and Person)  Thought Content: Logical   Suicidal Thoughts:  {ST/HT (PAA):22692}  Homicidal Thoughts:  {ST/HT (PAA):22692}  Memory:  Immediate;   Good  Judgement:  {Judgement (PAA):22694}  Insight:  {Insight (PAA):22695}  Psychomotor Activity:  Normal  Concentration:  Attention Span: Good  Recall:  Good  Fund of Knowledge: Good  Language: Good  Akathisia:  No  Handed:  Right  AIMS (if indicated): not done  Assets:  Communication Skills Desire for Improvement  ADL's:  Intact  Cognition: WNL  Sleep:  {[facility] GOOD/FAIR/POOR:22877}   Screenings: GAD-7    Flowsheet Row Office Visit from 05/16/2023 in [facility]  Total GAD-7 Score 13   PHQ2-9    Flowsheet Row Office Visit from 06/15/2023 in [facility] Visit from 05/16/2023 in [facility] Office Visit from 11/28/2022 in [facility] Office Visit from 05/30/2022 in [facility] Video Visit from 08/26/2021 in [facility]  PHQ-2 Total Score 2 4 6 1 2   PHQ-9 Total Score 8 14 19  -- 14   Flowsheet Row Office Visit from 11/28/2022 in [facility] Video Visit from 08/26/2021 in [facility] Video Visit from 01/14/2021 in [facility]  C-SSRS RISK CATEGORY Error: Q3, 4, or 5 should not be populated when Q2 is No Error: Q3, 4, or 5 should not be populated when Q2 is No Low Risk     Assessment and Plan:  KAILONI VAHLE is a 63 y.o. year old female with a history of depression, anxiety,   HTN, HL, dysphasia, asthma. migraine, sleep apnea (not able to use cpap machine), s/p Covid pneumonia in 08/2020, who presents for follow up appointment for below.    1. MDD (major depressive disorder), recurrent episode, mild (HCC) 2. Anxiety state Acute stressors include: loss of her father with dementia, CHF in April 2024 Other stressors include: loss of her mother in June 2021, neck through back pain    History:    She reports slight worsening in anxiety, and depressive symptoms including fatigue since her last visit.  It is likely multifactorial given medication induced hyperthyroidism, persistent cough, on steroid.  She agrees to stay on the current medication regimen for now, and reassess once her physical symptoms are stabilized.  Will continue venlafaxine  to target depression and anxiety, along with bupropion  as adjunctive treatment for depression.  She has no known history of seizure..    2. Restless leg syndrome 3. 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 Insomnia, unspecified type -She has a history of sleep apnea, although she has not used CPAP machine for many years.   She will see her pulmonologist for asthma.  She is advised to discuss regarding the possible reevaluation of sleep apnea.    Plan Continue venlafaxine  225 mg daily Continue bupropion  150 mg daily  Next appointment:  4/15 at 3:30, IP - on Ambien  10 mg at night, valium  5 mg TID for muscle spasm - gabapentin  600 mg TID for nerve pain - iron pill   Past trials- duloxetine (did not like), nortriptyline  ([facility]), Abilify , quetiapine , (she cannot recall other medication trials)    The patient demonstrates the following risk factors for suicide: Chronic risk factors for suicide include: psychiatric disorder of depression. Acute risk factors for suicide include: unemployment. Protective factors for this patient include:  positive social support, coping skills and hope for the future. Considering these factors, the overall suicide risk at this point appears to be low. Patient is appropriate for outpatient follow up.  Collaboration of Care: Collaboration of Care: {BH OP Collaboration of Care:21014065}  Patient/Guardian was advised Release of Information must be obtained prior to any record release in order to collaborate their care with an outside provider. Patient/Guardian was advised if they have not already done so to contact the registration department to sign all necessary forms in order for us  to release information regarding their care.   Consent: Patient/Guardian gives verbal consent for treatment and assignment of benefits for services provided during this visit. Patient/Guardian expressed understanding and agreed to proceed.    Katheren Sleet, MD 04/13/2024, 9:02 AM

## 2024-04-17 ENCOUNTER — Telehealth: Payer: Self-pay | Admitting: Psychiatry

## 2024-04-17 ENCOUNTER — Telehealth: Admitting: Psychiatry

## 2024-04-17 NOTE — Telephone Encounter (Signed)
 PT had an 11:30 virtual visit today. Her husband called around 10 am informing us  she has a migraine and can not make it. Due to several cancellations and No Shows, I called the PT and spoke to her husband informing of our attendance policy. We rescheduled [name] to an in person visit in October and it was left that if she cancels or does not come, she will be discharged from the practice. The husband understood and thanked us  for our patience.

## 2024-05-01 ENCOUNTER — Other Ambulatory Visit: Payer: Self-pay | Admitting: Psychiatry

## 2024-05-02 DIAGNOSIS — G4700 Insomnia, unspecified: Secondary | ICD-10-CM | POA: Diagnosis not present

## 2024-05-02 DIAGNOSIS — G894 Chronic pain syndrome: Secondary | ICD-10-CM | POA: Diagnosis not present

## 2024-05-02 DIAGNOSIS — M47817 Spondylosis without myelopathy or radiculopathy, lumbosacral region: Secondary | ICD-10-CM | POA: Diagnosis not present

## 2024-05-02 DIAGNOSIS — M62830 Muscle spasm of back: Secondary | ICD-10-CM | POA: Diagnosis not present

## 2024-05-31 ENCOUNTER — Other Ambulatory Visit: Payer: Self-pay | Admitting: Internal Medicine

## 2024-06-06 DIAGNOSIS — G4700 Insomnia, unspecified: Secondary | ICD-10-CM | POA: Diagnosis not present

## 2024-06-06 DIAGNOSIS — M47817 Spondylosis without myelopathy or radiculopathy, lumbosacral region: Secondary | ICD-10-CM | POA: Diagnosis not present

## 2024-06-06 DIAGNOSIS — G894 Chronic pain syndrome: Secondary | ICD-10-CM | POA: Diagnosis not present

## 2024-06-06 DIAGNOSIS — M62830 Muscle spasm of back: Secondary | ICD-10-CM | POA: Diagnosis not present

## 2024-06-08 NOTE — Progress Notes (Unsigned)
 [facility] MD/PA/NP OP Progress Note  06/10/2024 4:16 PM [name]  MRN:  [id number]  Chief Complaint:  Chief Complaint  Patient presents with   Follow-up   HPI:  - she was seen last in Feb 2025 This is a follow-up appointment for depression and anxiety.  She states that the pain is bad.  She is in the bed most of the time.  She reports frustration of not being able to do things, although she used to have a lot of fun.  She does not go anywhere due to the pain.  She also reports difficulty in coming to the appointment due to exacerbation of the pain.  She  is seen by pain specialist every month.  She may watch TV, or playing game.  She used to go shopping.  She was able to get a scooter chair and it was fun to go there the other time.  She was able to get her nails done.  She reports some frustration of her husband, who treats her like a little girl.  She does not think there has been any change in her mood for the last several months.  Sleep is sporadic.  She reports decrease in appetite.  Although she reports passive SI of not wanting to wake up in the morning, she adamantly denies any plan or intent.  She agrees with the plans as outlined below.   Of note, she reports difficulty in coming to the appointment due to pain, although she does not have any issues of being seen. She agrees to keep the next visit.  Wt Readings from Last 3 Encounters:  06/10/24 176 lb (79.8 kg)  10/02/23 176 lb 12.8 oz (80.2 kg)  09/12/23 176 lb (79.8 kg)     Substance use   Tobacco Alcohol Other substances/  Current denies denies CBD Gummies  Past denies denies denies  Past Treatment            Employment: unemployed, disability due to back pain s/p six back surgery (used to work for medical transcription) Support: husband Household:  Husband, grandson (age 65. Moved in since age 20 to help patient and her husband for transportation) Marital status: married Number of children: 1 daughter (really good'  relationship) She has wonderful parents. Her mother deceased 2020-01-18.     Visit Diagnosis:    ICD-10-CM   1. MDD (major depressive disorder), recurrent episode, mild  F33.0     2. Anxiety state  F41.1       Past Psychiatric History: Please see initial evaluation for full details. I have reviewed the history. No updates at this time.     Past Medical History:  Past Medical History:  Diagnosis Date   Allergic rhinitis    Anxiety    Arthralgia    Asthma    Bronchitis, chronic (HCC)    Chronic back pain    Collagenous colitis    followed by Dr Geryl   COVID-19    09/03/20 with pneumonia   Depression    Diplopia 11/15/2013   Dysphagia    Endometriosis    Fibrocystic breast disease    GERD (gastroesophageal reflux disease)    History of colon polyps    Hyperlipidemia    Hypertension    Hypokalemia    IBS (irritable bowel syndrome)    Memory difficulty 11/15/2013   Migraine headache    Pneumonia 2017   x2   PONV (postoperative nausea and vomiting)    used patch  Sleep apnea    recommended CPAP   Ulcer disease    Urine incontinence     Past Surgical History:  Procedure Laterality Date   ABDOMINAL HYSTERECTOMY     endometriosis   BACK SURGERY     x6   BREAST BIOPSY Right    COLONOSCOPY WITH PROPOFOL  N/A 06/25/2015   Procedure: COLONOSCOPY WITH PROPOFOL ;  Surgeon: Lamar ONEIDA Holmes, MD;  Location: [facility] ENDOSCOPY;  Service: Endoscopy;  Laterality: N/A;   DILATION AND CURETTAGE OF UTERUS     and hysteroscopy   ESOPHAGOGASTRODUODENOSCOPY N/A 06/25/2015   Procedure: ESOPHAGOGASTRODUODENOSCOPY (EGD);  Surgeon: Lamar ONEIDA Holmes, MD;  Location: [facility] ENDOSCOPY;  Service: Endoscopy;  Laterality: N/A;   insertion of permanent spinal cord stimulator     insertion of trial spinal cord stimulator     LUMBAR DISC SURGERY     LUMBAR FUSION  4/05 and 8/07   LUMBAR MICRODISCECTOMY  03/28/03   L4-5 L5   MOUTH SURGERY     NASAL SEPTOPLASTY W/ TURBINOPLASTY     removal of  spinal cord stimulator     SEPTOPLASTY     with reduction of turbinates   THYROIDECTOMY N/A 06/06/2016   Procedure: THYROIDECTOMY;  Surgeon: Reyes LELON Cota, MD;  Location: [facility] ORS;  Service: General;  Laterality: N/A;    Family Psychiatric History: Please see initial evaluation for full details. I have reviewed the history. No updates at this time.     Family History:  Family History  Problem Relation Age of Onset   Allergies Mother    Hypertension Mother    Arthritis Mother    Hyperlipidemia Mother    Stroke Mother    Hypertension Father    Arthritis Father    Diabetes Father    Hypertension Brother    Diabetes Brother    Arthritis Maternal Grandmother    Hyperlipidemia Maternal Grandmother    Hyperlipidemia Maternal Grandfather    Arthritis Paternal Grandmother    Arthritis Paternal Grandfather    Asthma Other        several family member   Leukemia Other        uncle   Lymphoma Maternal Aunt        non hodgkins    Social History:  Social History   Socioeconomic History   Marital status: Married    Spouse name: douglas   Number of children: 1   Years of education: 12 th   Highest education level: High school graduate  Occupational History   Occupation: Disabled    Employer: DISABLED  Tobacco Use   Smoking status: Never   Smokeless tobacco: Never  Vaping Use   Vaping status: Never Used  Substance and Sexual Activity   Alcohol use: No    Alcohol/week: 0.0 standard drinks of alcohol   Drug use: No   Sexual activity: Not Currently  Other Topics Concern   Not on file  Social History Narrative   ** Merged History Encounter **       Social Drivers of Health   Financial Resource Strain: Low Risk  (05/05/2020)   Overall Financial Resource Strain (CARDIA)    Difficulty of Paying Living Expenses: Not hard at all  Food Insecurity: No Food Insecurity (05/05/2020)   Hunger Vital Sign    Worried About Running Out of Food in the Last Year: Never true    Ran  Out of Food in the Last Year: Never true  Transportation Needs: No Transportation Needs (05/05/2020)   PRAPARE -  Administrator, Civil Service (Medical): No    Lack of Transportation (Non-Medical): No  Physical Activity: Inactive (07/17/2017)   Exercise Vital Sign    Days of Exercise per Week: 0 days    Minutes of Exercise per Session: 0 min  Stress: No Stress Concern Present (05/05/2020)   [employer] of Occupational Health - Occupational Stress Questionnaire    Feeling of Stress : Not at all  Social Connections: Unknown (05/05/2020)   Social Connection and Isolation Panel    Frequency of Communication with Friends and Family: Not on file    Frequency of Social Gatherings with Friends and Family: Not on file    Attends Religious Services: Not on file    Active Member of Clubs or Organizations: Not on file    Attends Banker Meetings: Not on file    Marital Status: Married    Allergies:  Allergies  Allergen Reactions   Fentanyl Other (See Comments)    Other reaction(s): Headache, Other (See Comments) headache SEVERE HEADACHE headache   Sumatriptan Other (See Comments)    Other reaction(s): Headache Severe headache   Doxycycline Other (See Comments)    Other reaction(s): Headache, Other (See Comments) REACTION: causes severe abd pain REACTION: causes severe abd pain REACTION: causes severe abd pain   Montelukast  Other (See Comments)    SEVERE HEADACHE   Oxymorphone Other (See Comments)    REACTION: severe headaches   Benzoin Rash   Buspirone Other (See Comments)    SEVERE HEADACHE   Ciprofloxacin Nausea Only   Conjugated Estrogens  Other (See Comments)    headaches   Cymbalta [Duloxetine Hcl] Other (See Comments)    Headache   Duloxetine Other (See Comments)    Headache   Erythromycin Ethylsuccinate Other (See Comments)    Stomach ache   Estradiol Other (See Comments)    headache   Metronidazole Other (See Comments)    abd pain    Mirtazapine Other (See Comments)    headache    Metabolic Disorder Labs: Lab Results  Component Value Date   HGBA1C 5.4 05/28/2020   No results found for: PROLACTIN Lab Results  Component Value Date   CHOL 228 (H) 06/15/2023   TRIG 177.0 (H) 06/15/2023   HDL 50.80 06/15/2023   CHOLHDL 4 06/15/2023   VLDL 35.4 06/15/2023   LDLCALC 142 (H) 06/15/2023   LDLCALC 121 (H) 07/12/2016   Lab Results  Component Value Date   TSH 0.03 (L) 08/22/2023   TSH 0.28 (L) 06/15/2023    Therapeutic Level Labs: No results found for: LITHIUM No results found for: VALPROATE No results found for: CBMZ  Current Medications: Current Outpatient Medications  Medication Sig Dispense Refill   albuterol  (VENTOLIN  HFA) 108 (90 Base) MCG/ACT inhaler TAKE 2 PUFFS BY MOUTH EVERY 6 HOURS AS NEEDED FOR WHEEZE OR SHORTNESS OF BREATH 18 each 1   amLODipine  (NORVASC ) 5 MG tablet TAKE 1 TABLET BY MOUTH EVERY DAY 90 tablet 0   bisacodyl (DULCOLAX) 5 MG EC tablet Take 5 mg by mouth daily as needed for moderate constipation.     buPROPion  (WELLBUTRIN  XL) 150 MG 24 hr tablet Take 1 tablet (150 mg total) by mouth daily. 90 tablet 0   cefdinir  (OMNICEF ) 300 MG capsule Take 1 capsule (300 mg total) by mouth 2 (two) times daily. 14 capsule 0   diazepam  (VALIUM ) 5 MG tablet Take 5 mg by mouth 3 (three) times daily as needed for anxiety.  docusate sodium  (COLACE) 100 MG capsule Take 100 mg by mouth 2 (two) times daily.     gabapentin  (NEURONTIN ) 600 MG tablet Take 600 mg by mouth 3 (three) times daily.     hydrochlorothiazide  (HYDRODIURIL ) 12.5 MG tablet TAKE 1 TABLET BY MOUTH EVERY DAY 90 tablet 1   levothyroxine  (SYNTHROID ) 100 MCG tablet Take 1 tablet (100 mcg total) by mouth daily. 90 tablet 3   metoprolol  tartrate (LOPRESSOR ) 50 MG tablet TAKE 1/2 TABLET BY MOUTH TWICE A DAY (NEED OFFICE VISIT) 90 tablet 1   Multiple Vitamin (MULTIVITAMIN) capsule Take 1 capsule by mouth daily.     mupirocin  ointment  (BACTROBAN ) 2 % Apply 1 Application topically 2 (two) times daily. 22 g 0   Oxycodone  HCl 10 MG TABS Take 10 mg by mouth 3 (three) times daily as needed (breakthrough pain).     pravastatin  (PRAVACHOL ) 20 MG tablet TAKE 1 TABLET BY MOUTH EVERY DAY 90 tablet 0   sodium chloride  (OCEAN) 0.65 % SOLN nasal spray Place 2 sprays into both nostrils daily as needed for congestion. 30 mL 2   TRELEGY ELLIPTA  100-62.5-25 MCG/ACT AEPB INHALE 1 PUFF BY MOUTH EVERY DAY 60 each 1   venlafaxine  XR (EFFEXOR -XR) 150 MG 24 hr capsule Take 1 capsule (150 mg total) by mouth daily. Take total of 225 mg daily. Take along with 75 mg cap 90 capsule 0   venlafaxine  XR (EFFEXOR -XR) 75 MG 24 hr capsule Take 1 capsule (75 mg total) by mouth daily. Take total of 225 mg daily. Take along with 150 mg cap 90 capsule 0   XTAMPZA  ER 18 MG C12A Take 1 capsule by mouth 2 (two) times daily.     zolpidem  (AMBIEN ) 10 MG tablet Take 10 mg by mouth at bedtime as needed for sleep.     predniSONE  (DELTASONE ) 10 MG tablet Take 6 tablets x 1 day and then decrease by 1/2 tablet per day until down to zero mg. (Patient not taking: Reported on 06/10/2024) 39 tablet 0   No current facility-administered medications for this visit.     Musculoskeletal: Strength & Muscle Tone: within normal limits Gait & Station: uses a walker Patient leans: N/A  Psychiatric Specialty Exam: Review of Systems  Psychiatric/Behavioral:  Positive for decreased concentration, dysphoric mood, sleep disturbance and suicidal ideas. Negative for agitation, behavioral problems, confusion, hallucinations and self-injury. The patient is nervous/anxious. The patient is not hyperactive.   All other systems reviewed and are negative.   Blood pressure 122/68, pulse 65, temperature 97.7 F (36.5 C), height 5' 6.5 (1.689 m), weight 176 lb (79.8 kg), SpO2 100%.Body mass index is 27.98 kg/m.  General Appearance: Well Groomed  Eye Contact:  Good  Speech:  Clear and Coherent   Volume:  Normal  Mood:  Depressed  Affect:  Appropriate, Congruent, and calm  Thought Process:  Coherent  Orientation:  Full (Time, Place, and Person)  Thought Content: Logical   Suicidal Thoughts:  Yes.  without intent/plan  Homicidal Thoughts:  No  Memory:  Immediate;   Good  Judgement:  Good  Insight:  Good  Psychomotor Activity:  Normal  Concentration:  Concentration: Good and Attention Span: Good  Recall:  Good  Fund of Knowledge: Good  Language: Good  Akathisia:  No  Handed:  Right  AIMS (if indicated): not done  Assets:  Communication Skills Desire for Improvement  ADL's:  Intact  Cognition: WNL  Sleep:  Poor   Screenings: GAD-7    Flowsheet Row  Office Visit from 06/10/2024 in [facility] Office Visit from 05/16/2023 in [facility]  Total GAD-7 Score 13 13   PHQ2-9    Flowsheet Row Office Visit from 06/10/2024 in [facility] Office Visit from 06/15/2023 in [facility] Visit from 05/16/2023 in [facility] Office Visit from 11/28/2022 in [facility] Office Visit from 05/30/2022 in [facility] [place]  PHQ-2 Total Score 4 2 4 6 1   PHQ-9 Total Score 22 8 14 19  --   Flowsheet Row Office Visit from 06/10/2024 in [facility] Office Visit from 11/28/2022 in [facility] Video Visit from 08/26/2021 in [facility]  C-SSRS RISK CATEGORY Error: Q3, 4, or 5 should not be populated when Q2 is No Error: Q3, 4, or 5 should not be populated when Q2 is No Error: Q3, 4, or 5 should not be populated when Q2 is No     Assessment and Plan:  [name] is a 63 y.o. year old female with a  history of depression, anxiety,  HTN, HL, dysphasia, asthma. migraine, sleep apnea (not able to use cpap machine), s/p Covid pneumonia in 08/2020, who presents for follow up appointment for below.    1. MDD (major depressive disorder), recurrent episode, mild 2. Anxiety state Acute stressors include: loss of her father with dementia, CHF in April 2024 Other stressors include: loss of her mother in June 2021, neck through back pain    History:     She continues to experience depressive symptoms and anxiety since the previous visit.  Although she is demoralized due to her back pain, it appears that her mood symptoms is interfering with her daily activities.  Will uptitrate bupropion  to optimize treatment for depression.  Discussed potential risk of headache, insomnia.  She has no known history of seizure.  Will continue venlafaxine  to target depression and anxiety.    2. Restless leg syndrome 3. 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   4. Insomnia, unspecified type -She has a history of sleep apnea, although she has not used CPAP machine for many years.   She will see her pulmonologist for asthma.  She is advised to discuss regarding the possible reevaluation of sleep apnea.    Plan Continue venlafaxine  225 mg daily Increase bupropion  300 mg daily  Next appointment:  1/5 at 3:30, IP - on Ambien  10 mg at night, valium  5 mg TID for muscle spasm - gabapentin  600 mg TID for nerve pain - iron pill   Past trials- duloxetine (did not like), nortriptyline  (VH), Abilify , quetiapine , (she cannot recall other medication trials)    The patient demonstrates the following risk factors for suicide: Chronic risk factors for suicide include: psychiatric disorder of depression. Acute risk factors for suicide include: unemployment. Protective factors for this patient include: positive social support, coping skills  and hope for the future. Considering these factors, the overall suicide risk at this point appears to be low. Patient is appropriate for outpatient follow up.    Collaboration of Care: Collaboration of Care: Other reviewed notes in Epic  Patient/Guardian was advised Release of Information must be obtained prior to any record release in order to collaborate their care with an outside provider.  Patient/Guardian was advised if they have not already done so to contact the registration department to sign all necessary forms in order for us  to release information regarding their care.   Consent: Patient/Guardian gives verbal consent for treatment and assignment of benefits for services provided during this visit. Patient/Guardian expressed understanding and agreed to proceed.    Katheren Sleet, MD 06/10/2024, 4:16 PM

## 2024-06-09 ENCOUNTER — Other Ambulatory Visit: Payer: Self-pay | Admitting: Internal Medicine

## 2024-06-09 DIAGNOSIS — J45901 Unspecified asthma with (acute) exacerbation: Secondary | ICD-10-CM

## 2024-06-10 ENCOUNTER — Ambulatory Visit (INDEPENDENT_AMBULATORY_CARE_PROVIDER_SITE_OTHER): Admitting: Psychiatry

## 2024-06-10 ENCOUNTER — Encounter: Payer: Self-pay | Admitting: Psychiatry

## 2024-06-10 VITALS — BP 122/68 | HR 65 | Temp 97.7°F | Ht 66.5 in | Wt 176.0 lb

## 2024-06-10 DIAGNOSIS — F330 Major depressive disorder, recurrent, mild: Secondary | ICD-10-CM

## 2024-06-10 DIAGNOSIS — F411 Generalized anxiety disorder: Secondary | ICD-10-CM

## 2024-06-10 MED ORDER — BUPROPION HCL ER (XL) 300 MG PO TB24: 300.0000 mg | ORAL_TABLET | Freq: Every day | ORAL | 0 refills | Status: AC

## 2024-06-10 MED ORDER — VENLAFAXINE HCL ER 150 MG PO CP24: 150.0000 mg | ORAL_CAPSULE | Freq: Every day | ORAL | 0 refills | Status: AC

## 2024-06-10 MED ORDER — VENLAFAXINE HCL ER 75 MG PO CP24: 75.0000 mg | ORAL_CAPSULE | Freq: Every day | ORAL | 0 refills | Status: AC

## 2024-06-10 NOTE — Patient Instructions (Signed)
 Continue venlafaxine  225 mg daily Increase bupropion  300 mg daily  Next appointment:  1/5 at 3:30

## 2024-06-20 ENCOUNTER — Other Ambulatory Visit: Payer: Self-pay | Admitting: Internal Medicine

## 2024-06-25 ENCOUNTER — Ambulatory Visit: Admitting: Internal Medicine

## 2024-07-09 DIAGNOSIS — M47817 Spondylosis without myelopathy or radiculopathy, lumbosacral region: Secondary | ICD-10-CM | POA: Diagnosis not present

## 2024-07-09 DIAGNOSIS — G894 Chronic pain syndrome: Secondary | ICD-10-CM | POA: Diagnosis not present

## 2024-07-09 DIAGNOSIS — G4700 Insomnia, unspecified: Secondary | ICD-10-CM | POA: Diagnosis not present

## 2024-07-09 DIAGNOSIS — M62830 Muscle spasm of back: Secondary | ICD-10-CM | POA: Diagnosis not present

## 2024-07-30 ENCOUNTER — Other Ambulatory Visit: Payer: Self-pay | Admitting: Internal Medicine

## 2024-07-30 ENCOUNTER — Other Ambulatory Visit: Payer: Self-pay | Admitting: Psychiatry

## 2024-08-08 ENCOUNTER — Other Ambulatory Visit: Payer: Self-pay | Admitting: Internal Medicine

## 2024-08-08 DIAGNOSIS — J45901 Unspecified asthma with (acute) exacerbation: Secondary | ICD-10-CM

## 2024-08-09 ENCOUNTER — Other Ambulatory Visit: Payer: Self-pay | Admitting: Internal Medicine

## 2024-08-09 DIAGNOSIS — E7800 Pure hypercholesterolemia, unspecified: Secondary | ICD-10-CM

## 2024-08-11 ENCOUNTER — Other Ambulatory Visit: Payer: Self-pay | Admitting: Internal Medicine

## 2024-08-13 ENCOUNTER — Other Ambulatory Visit: Payer: Self-pay | Admitting: Internal Medicine

## 2024-08-16 NOTE — Progress Notes (Deleted)
 [facility] MD/PA/NP OP Progress Note  08/16/2024 4:59 PM [name]  MRN:  [id number]  Chief Complaint: No chief complaint on file.  HPI: ***  Substance use   Tobacco Alcohol Other substances/  Current denies denies CBD Gummies  Past denies denies denies  Past Treatment            Employment: unemployed, disability due to back pain s/p six back surgery (used to work for medical transcription) Support: husband Household:  Husband, grandson (age 66. Moved in since age 39 to help patient and her husband for transportation) Marital status: married Number of children: 1 daughter (really good' relationship) She has wonderful parents. Her mother deceased 02-09-2020.  Visit Diagnosis: No diagnosis found.  Past Psychiatric History: Please see initial evaluation for full details. I have reviewed the history. No updates at this time.     Past Medical History:  Past Medical History:  Diagnosis Date   Allergic rhinitis    Anxiety    Arthralgia    Asthma    Bronchitis, chronic (HCC)    Chronic back pain    Collagenous colitis    followed by Dr Geryl   COVID-19    09/03/20 with pneumonia   Depression    Diplopia 11/15/2013   Dysphagia    Endometriosis    Fibrocystic breast disease    GERD (gastroesophageal reflux disease)    History of colon polyps    Hyperlipidemia    Hypertension    Hypokalemia    IBS (irritable bowel syndrome)    Memory difficulty 11/15/2013   Migraine headache    Pneumonia 2017   x2   PONV (postoperative nausea and vomiting)    used patch   Sleep apnea    recommended CPAP   Ulcer disease    Urine incontinence     Past Surgical History:  Procedure Laterality Date   ABDOMINAL HYSTERECTOMY     endometriosis   BACK SURGERY     x6   BREAST BIOPSY Right    COLONOSCOPY WITH PROPOFOL  N/A 06/25/2015   Procedure: COLONOSCOPY WITH PROPOFOL ;  Surgeon: Lamar ONEIDA Holmes, MD;  Location: [facility] ENDOSCOPY;  Service: Endoscopy;  Laterality: N/A;   DILATION AND  CURETTAGE OF UTERUS     and hysteroscopy   ESOPHAGOGASTRODUODENOSCOPY N/A 06/25/2015   Procedure: ESOPHAGOGASTRODUODENOSCOPY (EGD);  Surgeon: Lamar ONEIDA Holmes, MD;  Location: [facility] ENDOSCOPY;  Service: Endoscopy;  Laterality: N/A;   insertion of permanent spinal cord stimulator     insertion of trial spinal cord stimulator     LUMBAR DISC SURGERY     LUMBAR FUSION  4/05 and 8/07   LUMBAR MICRODISCECTOMY  03/28/03   L4-5 L5   MOUTH SURGERY     NASAL SEPTOPLASTY W/ TURBINOPLASTY     removal of spinal cord stimulator     SEPTOPLASTY     with reduction of turbinates   THYROIDECTOMY N/A 06/06/2016   Procedure: THYROIDECTOMY;  Surgeon: Reyes LELON Cota, MD;  Location: [facility] ORS;  Service: General;  Laterality: N/A;    Family Psychiatric History: Please see initial evaluation for full details. I have reviewed the history. No updates at this time.     Family History:  Family History  Problem Relation Age of Onset   Allergies Mother    Hypertension Mother    Arthritis Mother    Hyperlipidemia Mother    Stroke Mother    Hypertension Father    Arthritis Father    Diabetes Father    Hypertension  Brother    Diabetes Brother    Arthritis Maternal Grandmother    Hyperlipidemia Maternal Grandmother    Hyperlipidemia Maternal Grandfather    Arthritis Paternal Grandmother    Arthritis Paternal Grandfather    Asthma Other        several family member   Leukemia Other        uncle   Lymphoma Maternal Aunt        non hodgkins    Social History:  Social History   Socioeconomic History   Marital status: Married    Spouse name: [name]   Number of children: 1   Years of education: 12 th   Highest education level: High school graduate  Occupational History   Occupation: Disabled    Employer: DISABLED  Tobacco Use   Smoking status: Never   Smokeless tobacco: Never  Vaping Use   Vaping status: Never Used  Substance and Sexual Activity   Alcohol use: No    Alcohol/week: 0.0  standard drinks of alcohol   Drug use: No   Sexual activity: Not Currently  Other Topics Concern   Not on file  Social History Narrative   ** Merged History Encounter **       Social Drivers of Health   Tobacco Use: Low Risk (06/10/2024)   Patient History    Smoking Tobacco Use: Never    Smokeless Tobacco Use: Never    Passive Exposure: Not on file  Financial Resource Strain: Not on file  Food Insecurity: Not on file  Transportation Needs: Not on file  Physical Activity: Not on file  Stress: Not on file  Social Connections: Not on file  Depression (EYV7-0): High Risk (06/10/2024)   Depression (PHQ2-9)    PHQ-2 Score: 22  Alcohol Screen: Not on file  Housing: Not on file  Utilities: Not on file  Health Literacy: Not on file    Allergies: Allergies[1]  Metabolic Disorder Labs: Lab Results  Component Value Date   HGBA1C 5.4 05/28/2020   No results found for: PROLACTIN Lab Results  Component Value Date   CHOL 228 (H) 06/15/2023   TRIG 177.0 (H) 06/15/2023   HDL 50.80 06/15/2023   CHOLHDL 4 06/15/2023   VLDL 35.4 06/15/2023   LDLCALC 142 (H) 06/15/2023   LDLCALC 121 (H) 07/12/2016   Lab Results  Component Value Date   TSH 0.03 (L) 08/22/2023   TSH 0.28 (L) 06/15/2023    Therapeutic Level Labs: No results found for: LITHIUM No results found for: VALPROATE No results found for: CBMZ  Current Medications: Current Outpatient Medications  Medication Sig Dispense Refill   albuterol  (VENTOLIN  HFA) 108 (90 Base) MCG/ACT inhaler TAKE 2 PUFFS BY MOUTH EVERY 6 HOURS AS NEEDED FOR WHEEZE OR SHORTNESS OF BREATH 18 each 1   amLODipine  (NORVASC ) 5 MG tablet TAKE 1 TABLET BY MOUTH EVERY DAY 90 tablet 1   bisacodyl (DULCOLAX) 5 MG EC tablet Take 5 mg by mouth daily as needed for moderate constipation.     buPROPion  (WELLBUTRIN  XL) 300 MG 24 hr tablet Take 1 tablet (300 mg total) by mouth daily. 90 tablet 0   buPROPion  (WELLBUTRIN  XL) 300 MG 24 hr tablet Take 1  tablet (300 mg total) by mouth daily. 90 tablet 0   cefdinir  (OMNICEF ) 300 MG capsule Take 1 capsule (300 mg total) by mouth 2 (two) times daily. 14 capsule 0   diazepam  (VALIUM ) 5 MG tablet Take 5 mg by mouth 3 (three) times daily as needed for  anxiety.     docusate sodium  (COLACE) 100 MG capsule Take 100 mg by mouth 2 (two) times daily.     Fluticasone -Umeclidin-Vilant (TRELEGY ELLIPTA ) 100-62.5-25 MCG/ACT AEPB INHALE 1 PUFF BY MOUTH EVERY DAY 30 each 0   gabapentin  (NEURONTIN ) 600 MG tablet Take 600 mg by mouth 3 (three) times daily.     hydrochlorothiazide  (HYDRODIURIL ) 12.5 MG tablet TAKE 1 TABLET BY MOUTH EVERY DAY 90 tablet 1   levothyroxine  (SYNTHROID ) 100 MCG tablet Take 1 tablet (100 mcg total) by mouth daily. 90 tablet 3   metoprolol  tartrate (LOPRESSOR ) 50 MG tablet TAKE 1/2 TABLET BY MOUTH TWICE A DAY 90 tablet 0   Multiple Vitamin (MULTIVITAMIN) capsule Take 1 capsule by mouth daily.     mupirocin  ointment (BACTROBAN ) 2 % Apply 1 Application topically 2 (two) times daily. 22 g 0   Oxycodone  HCl 10 MG TABS Take 10 mg by mouth 3 (three) times daily as needed (breakthrough pain).     pravastatin  (PRAVACHOL ) 20 MG tablet TAKE 1 TABLET BY MOUTH EVERY DAY 30 tablet 0   predniSONE  (DELTASONE ) 10 MG tablet Take 6 tablets x 1 day and then decrease by 1/2 tablet per day until down to zero mg. (Patient not taking: Reported on 06/10/2024) 39 tablet 0   sodium chloride  (OCEAN) 0.65 % SOLN nasal spray Place 2 sprays into both nostrils daily as needed for congestion. 30 mL 2   venlafaxine  XR (EFFEXOR -XR) 150 MG 24 hr capsule Take 1 capsule (150 mg total) by mouth daily. Take total of 225 mg daily. Take along with 75 mg cap 90 capsule 0   venlafaxine  XR (EFFEXOR -XR) 75 MG 24 hr capsule Take 1 capsule (75 mg total) by mouth daily. Take total of 225 mg daily. Take along with 150 mg cap 90 capsule 0   XTAMPZA  ER 18 MG C12A Take 1 capsule by mouth 2 (two) times daily.     zolpidem  (AMBIEN ) 10 MG tablet  Take 10 mg by mouth at bedtime as needed for sleep.     No current facility-administered medications for this visit.     Musculoskeletal: Strength & Muscle Tone: within normal limits Gait & Station: normal Patient leans: N/A  Psychiatric Specialty Exam: Review of Systems  There were no vitals taken for this visit.There is no height or weight on file to calculate BMI.  General Appearance: {Appearance:22683}  Eye Contact:  {B[facility] EYE CONTACT:22684}  Speech:  Clear and Coherent  Volume:  Normal  Mood:  {B[facility] MOOD:22306}  Affect:  {Affect (PAA):22687}  Thought Process:  Coherent  Orientation:  Full (Time, Place, and Person)  Thought Content: Logical   Suicidal Thoughts:  {ST/HT (PAA):22692}  Homicidal Thoughts:  {ST/HT (PAA):22692}  Memory:  Immediate;   Good  Judgement:  {Judgement (PAA):22694}  Insight:  {Insight (PAA):22695}  Psychomotor Activity:  Normal  Concentration:  Concentration: Good and Attention Span: Good  Recall:  Good  Fund of Knowledge: Good  Language: Good  Akathisia:  No  Handed:  Right  AIMS (if indicated): not done  Assets:  Communication Skills Desire for Improvement  ADL's:  Intact  Cognition: WNL  Sleep:  {[facility] GOOD/FAIR/POOR:22877}   Screenings: GAD-7    Loss Adjuster, Chartered Office Visit from 06/10/2024 in [facility] Office Visit from 05/16/2023 in [facility]  Total GAD-7 Score 13 13   PHQ2-9    Flowsheet Row Office Visit from 06/10/2024 in [facility] Office Visit  from 06/15/2023 in [facility] Visit from 05/16/2023 in [facility] Office Visit from 11/28/2022 in [facility] Office Visit from 05/30/2022 in [facility] Total Score 4 2 4 6 1   PHQ-9 Total Score 22  8 14 19  --   Flowsheet Row Office Visit from 06/10/2024 in [facility] Office Visit from 11/28/2022 in [facility] Video Visit from 08/26/2021 in [facility]  C-SSRS RISK CATEGORY Error: Q3, 4, or 5 should not be populated when Q2 is No Error: Q3, 4, or 5 should not be populated when Q2 is No Error: Q3, 4, or 5 should not be populated when Q2 is No     Assessment and Plan:  [name] is a 64 y.o. year old female with a history of depression, anxiety,  HTN, HL, dysphasia, asthma. migraine, sleep apnea (not able to use cpap machine), s/p Covid pneumonia in 08/2020, who presents for follow up appointment for below.    1. MDD (major depressive disorder), recurrent episode, mild 2. Anxiety state Acute stressors include: loss of her father with dementia, CHF in April 2024 Other stressors include: loss of her mother in June 2021, neck through back pain    History:     She continues to experience depressive symptoms and anxiety since the previous visit.  Although she is demoralized due to her back pain, it appears that her mood symptoms is interfering with her daily activities.  Will uptitrate bupropion  to optimize treatment for depression.  Discussed potential risk of headache, insomnia.  She has no known history of seizure.  Will continue venlafaxine  to target depression and anxiety.    2. Restless leg syndrome 3. 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    4. Insomnia, unspecified type -She has a history of sleep apnea, although she has not used CPAP machine for many years.   She will see her pulmonologist for asthma.  She is advised to discuss regarding the possible reevaluation of sleep apnea.    Plan Continue venlafaxine  225 mg daily Increase bupropion  300 mg daily  Next  appointment:  1/5 at 3:30, IP - on Ambien  10 mg at night, valium  5 mg TID for muscle spasm - gabapentin  600 mg TID for nerve pain - iron pill   Past trials- duloxetine (did not like), nortriptyline  (VH), Abilify , quetiapine , (she cannot recall other medication trials)    The patient demonstrates the following risk factors for suicide: Chronic risk factors for suicide include: psychiatric disorder of depression. Acute risk factors for suicide include: unemployment. Protective factors for this patient include: positive social support, coping skills and hope for the future. Considering these factors, the overall suicide risk at this point appears to be low. Patient is appropriate for outpatient follow up.    Collaboration of Care: Collaboration of Care: {BH OP Collaboration of Care:21014065}  Patient/Guardian was advised Release of Information must be obtained prior to any record release in order to collaborate their care with an outside provider. Patient/Guardian was advised if they have not already done so to contact the registration department to sign all necessary forms in order for us  to release information regarding their care.   Consent: Patient/Guardian gives verbal consent for treatment and assignment of benefits for services provided during this  visit. Patient/Guardian expressed understanding and agreed to proceed.    Katheren Sleet, MD 08/16/2024, 5:00 PM     [1]  Allergies Allergen Reactions   Fentanyl Other (See Comments)    Other reaction(s): Headache, Other (See Comments) headache SEVERE HEADACHE headache   Sumatriptan Other (See Comments)    Other reaction(s): Headache Severe headache   Doxycycline Other (See Comments)    Other reaction(s): Headache, Other (See Comments) REACTION: causes severe abd pain REACTION: causes severe abd pain REACTION: causes severe abd pain   Montelukast  Other (See Comments)    SEVERE HEADACHE   Oxymorphone Other (See Comments)     REACTION: severe headaches   Benzoin Rash   Buspirone Other (See Comments)    SEVERE HEADACHE   Ciprofloxacin Nausea Only   Conjugated Estrogens  Other (See Comments)    headaches   Cymbalta [Duloxetine Hcl] Other (See Comments)    Headache   Duloxetine Other (See Comments)    Headache   Erythromycin Ethylsuccinate Other (See Comments)    Stomach ache   Estradiol Other (See Comments)    headache   Metronidazole Other (See Comments)    abd pain   Mirtazapine Other (See Comments)    headache

## 2024-08-19 ENCOUNTER — Encounter: Payer: Self-pay | Admitting: Psychiatry

## 2024-08-19 ENCOUNTER — Ambulatory Visit: Admitting: Psychiatry

## 2024-08-19 ENCOUNTER — Telehealth: Payer: Self-pay | Admitting: Psychiatry

## 2024-08-19 NOTE — Telephone Encounter (Signed)
 Patient's husband called today informing us  that she is sick and can not make it in. Patient was given our attendance policy at her last visit. She understood that if she is a No Show, she will not be able to be rescheduled. I spoke with her husband informing him as she was unable to speak. He understood.

## 2024-08-29 ENCOUNTER — Other Ambulatory Visit: Payer: Self-pay | Admitting: Internal Medicine

## 2024-09-02 ENCOUNTER — Other Ambulatory Visit: Payer: Self-pay | Admitting: Internal Medicine

## 2024-09-02 DIAGNOSIS — E7800 Pure hypercholesterolemia, unspecified: Secondary | ICD-10-CM

## 2024-09-05 ENCOUNTER — Other Ambulatory Visit: Payer: Self-pay | Admitting: Internal Medicine

## 2024-09-05 DIAGNOSIS — J45901 Unspecified asthma with (acute) exacerbation: Secondary | ICD-10-CM

## 2024-09-06 ENCOUNTER — Other Ambulatory Visit: Payer: Self-pay | Admitting: Internal Medicine

## 2024-09-06 DIAGNOSIS — E7800 Pure hypercholesterolemia, unspecified: Secondary | ICD-10-CM

## 2024-09-07 ENCOUNTER — Other Ambulatory Visit: Payer: Self-pay | Admitting: Psychiatry

## 2024-09-23 ENCOUNTER — Ambulatory Visit: Admitting: Internal Medicine
